# Patient Record
Sex: Female | Born: 1955
Health system: Southern US, Community
[De-identification: ages and names within clinical notes are randomized; demographics above are authoritative.]

## PROBLEM LIST (undated history)

## (undated) ENCOUNTER — Emergency Department (HOSPITAL_COMMUNITY): Disposition: A | Discharge status: Home / Self Care | Payer: 59 | Source: Home / Self Care

## (undated) DIAGNOSIS — K469 Unspecified abdominal hernia without obstruction or gangrene: Secondary | ICD-10-CM

## (undated) DIAGNOSIS — T7840XA Allergy, unspecified, initial encounter: Secondary | ICD-10-CM

## (undated) DIAGNOSIS — K648 Other hemorrhoids: Secondary | ICD-10-CM

## (undated) DIAGNOSIS — F32A Depression, unspecified: Secondary | ICD-10-CM

## (undated) DIAGNOSIS — K219 Gastro-esophageal reflux disease without esophagitis: Secondary | ICD-10-CM

## (undated) DIAGNOSIS — J449 Chronic obstructive pulmonary disease, unspecified: Secondary | ICD-10-CM

## (undated) DIAGNOSIS — E785 Hyperlipidemia, unspecified: Secondary | ICD-10-CM

## (undated) DIAGNOSIS — I10 Essential (primary) hypertension: Secondary | ICD-10-CM

## (undated) DIAGNOSIS — F329 Major depressive disorder, single episode, unspecified: Secondary | ICD-10-CM

## (undated) DIAGNOSIS — M1990 Unspecified osteoarthritis, unspecified site: Secondary | ICD-10-CM

## (undated) DIAGNOSIS — Z86010 Personal history of colonic polyps: Secondary | ICD-10-CM

## (undated) DIAGNOSIS — R0600 Dyspnea, unspecified: Secondary | ICD-10-CM

## (undated) DIAGNOSIS — Z8619 Personal history of other infectious and parasitic diseases: Secondary | ICD-10-CM

## (undated) DIAGNOSIS — E119 Type 2 diabetes mellitus without complications: Secondary | ICD-10-CM

## (undated) HISTORY — DX: Personal history of other infectious and parasitic diseases: Z86.19

## (undated) HISTORY — DX: Personal history of colonic polyps: Z86.010

## (undated) HISTORY — PX: HEMORRHOID SURGERY: SHX153

## (undated) HISTORY — DX: Chronic obstructive pulmonary disease, unspecified: J44.9

## (undated) HISTORY — PX: COLONOSCOPY: SHX174

## (undated) HISTORY — DX: Major depressive disorder, single episode, unspecified: F32.9

## (undated) HISTORY — DX: Essential (primary) hypertension: I10

## (undated) HISTORY — DX: Depression, unspecified: F32.A

## (undated) HISTORY — DX: Other hemorrhoids: K64.8

## (undated) HISTORY — DX: Unspecified osteoarthritis, unspecified site: M19.90

## (undated) HISTORY — DX: Gastro-esophageal reflux disease without esophagitis: K21.9

## (undated) HISTORY — DX: Hyperlipidemia, unspecified: E78.5

## (undated) HISTORY — PX: ABDOMINAL HYSTERECTOMY: SHX81

## (undated) HISTORY — DX: Allergy, unspecified, initial encounter: T78.40XA

---

## 1998-04-10 ENCOUNTER — Ambulatory Visit (HOSPITAL_COMMUNITY): Admission: RE | Admit: 1998-04-10 | Discharge: 1998-04-10 | Payer: Self-pay | Admitting: Family Medicine

## 1998-09-17 ENCOUNTER — Other Ambulatory Visit: Admission: RE | Admit: 1998-09-17 | Discharge: 1998-09-17 | Payer: Self-pay | Admitting: Obstetrics & Gynecology

## 1998-09-30 ENCOUNTER — Ambulatory Visit (HOSPITAL_COMMUNITY): Admission: RE | Admit: 1998-09-30 | Discharge: 1998-09-30 | Payer: Self-pay | Admitting: Obstetrics & Gynecology

## 2000-06-11 ENCOUNTER — Emergency Department (HOSPITAL_COMMUNITY): Admission: EM | Admit: 2000-06-11 | Discharge: 2000-06-11 | Payer: Self-pay | Admitting: Emergency Medicine

## 2000-08-08 ENCOUNTER — Inpatient Hospital Stay (HOSPITAL_COMMUNITY): Admission: RE | Admit: 2000-08-08 | Discharge: 2000-08-09 | Payer: Self-pay | Admitting: Family Medicine

## 2000-10-19 ENCOUNTER — Ambulatory Visit (HOSPITAL_COMMUNITY): Admission: RE | Admit: 2000-10-19 | Discharge: 2000-10-19 | Payer: Self-pay | Admitting: Family Medicine

## 2000-10-19 ENCOUNTER — Encounter: Payer: Self-pay | Admitting: Family Medicine

## 2001-06-12 ENCOUNTER — Ambulatory Visit (HOSPITAL_COMMUNITY): Admission: RE | Admit: 2001-06-12 | Discharge: 2001-06-12 | Payer: Self-pay | Admitting: Family Medicine

## 2001-08-15 ENCOUNTER — Encounter (INDEPENDENT_AMBULATORY_CARE_PROVIDER_SITE_OTHER): Payer: Self-pay

## 2001-08-15 ENCOUNTER — Inpatient Hospital Stay (HOSPITAL_COMMUNITY): Admission: RE | Admit: 2001-08-15 | Discharge: 2001-08-17 | Payer: Self-pay | Admitting: Obstetrics & Gynecology

## 2002-10-28 ENCOUNTER — Ambulatory Visit (HOSPITAL_COMMUNITY): Admission: RE | Admit: 2002-10-28 | Discharge: 2002-10-28 | Payer: Self-pay | Admitting: Surgery

## 2002-10-28 ENCOUNTER — Encounter (INDEPENDENT_AMBULATORY_CARE_PROVIDER_SITE_OTHER): Payer: Self-pay

## 2003-07-24 ENCOUNTER — Emergency Department (HOSPITAL_COMMUNITY): Admission: EM | Admit: 2003-07-24 | Discharge: 2003-07-24 | Payer: Self-pay | Admitting: Emergency Medicine

## 2004-01-26 ENCOUNTER — Ambulatory Visit (HOSPITAL_COMMUNITY): Admission: RE | Admit: 2004-01-26 | Discharge: 2004-01-26 | Payer: Self-pay | Admitting: Emergency Medicine

## 2004-01-26 ENCOUNTER — Emergency Department (HOSPITAL_COMMUNITY): Admission: EM | Admit: 2004-01-26 | Discharge: 2004-01-26 | Payer: Self-pay | Admitting: Emergency Medicine

## 2004-05-06 ENCOUNTER — Ambulatory Visit (HOSPITAL_COMMUNITY): Admission: RE | Admit: 2004-05-06 | Discharge: 2004-05-06 | Payer: Self-pay | Admitting: Family Medicine

## 2006-12-25 ENCOUNTER — Other Ambulatory Visit: Admission: RE | Admit: 2006-12-25 | Discharge: 2006-12-25 | Payer: Self-pay | Admitting: Family Medicine

## 2007-01-04 ENCOUNTER — Encounter: Admission: RE | Admit: 2007-01-04 | Discharge: 2007-01-04 | Payer: Self-pay | Admitting: Family Medicine

## 2007-01-16 ENCOUNTER — Encounter: Admission: RE | Admit: 2007-01-16 | Discharge: 2007-01-16 | Payer: Self-pay | Admitting: Family Medicine

## 2007-11-23 ENCOUNTER — Encounter: Admission: RE | Admit: 2007-11-23 | Discharge: 2007-11-23 | Payer: Self-pay | Admitting: Family Medicine

## 2008-01-06 ENCOUNTER — Emergency Department (HOSPITAL_COMMUNITY): Admission: EM | Admit: 2008-01-06 | Discharge: 2008-01-06 | Payer: Self-pay | Admitting: Emergency Medicine

## 2008-01-09 ENCOUNTER — Emergency Department (HOSPITAL_COMMUNITY): Admission: EM | Admit: 2008-01-09 | Discharge: 2008-01-09 | Payer: Self-pay | Admitting: Emergency Medicine

## 2008-01-22 ENCOUNTER — Emergency Department (HOSPITAL_COMMUNITY): Admission: EM | Admit: 2008-01-22 | Discharge: 2008-01-22 | Payer: Self-pay | Admitting: Family Medicine

## 2008-01-23 ENCOUNTER — Ambulatory Visit: Payer: Self-pay | Admitting: *Deleted

## 2008-02-11 ENCOUNTER — Ambulatory Visit: Payer: Self-pay | Admitting: Internal Medicine

## 2008-03-20 ENCOUNTER — Ambulatory Visit: Payer: Self-pay | Admitting: Internal Medicine

## 2008-03-20 ENCOUNTER — Encounter (INDEPENDENT_AMBULATORY_CARE_PROVIDER_SITE_OTHER): Payer: Self-pay | Admitting: Family Medicine

## 2008-03-20 LAB — CONVERTED CEMR LAB
ALT: 15 units/L (ref 0–35)
Basophils Absolute: 0.1 10*3/uL (ref 0.0–0.1)
CO2: 23 meq/L (ref 19–32)
Cholesterol: 244 mg/dL — ABNORMAL HIGH (ref 0–200)
Eosinophils Relative: 5 % (ref 0–5)
HCT: 42.3 % (ref 36.0–46.0)
LDL Cholesterol: 176 mg/dL — ABNORMAL HIGH (ref 0–99)
Lymphocytes Relative: 39 % (ref 12–46)
Neutro Abs: 2.4 10*3/uL (ref 1.7–7.7)
Neutrophils Relative %: 48 % (ref 43–77)
Platelets: 233 10*3/uL (ref 150–400)
Potassium: 4.3 meq/L (ref 3.5–5.3)
RDW: 16.9 % — ABNORMAL HIGH (ref 11.5–15.5)
Sodium: 140 meq/L (ref 135–145)
Total Bilirubin: 0.3 mg/dL (ref 0.3–1.2)
Total Protein: 7.3 g/dL (ref 6.0–8.3)
VLDL: 18 mg/dL (ref 0–40)
Vitamin B-12: 467 pg/mL (ref 211–911)

## 2008-03-28 ENCOUNTER — Ambulatory Visit: Payer: Self-pay | Admitting: Internal Medicine

## 2009-04-10 ENCOUNTER — Emergency Department (HOSPITAL_COMMUNITY): Admission: EM | Admit: 2009-04-10 | Discharge: 2009-04-10 | Payer: Self-pay | Admitting: Emergency Medicine

## 2009-04-30 ENCOUNTER — Emergency Department (HOSPITAL_COMMUNITY): Admission: EM | Admit: 2009-04-30 | Discharge: 2009-04-30 | Payer: Self-pay | Admitting: Emergency Medicine

## 2009-05-07 ENCOUNTER — Emergency Department (HOSPITAL_COMMUNITY): Admission: EM | Admit: 2009-05-07 | Discharge: 2009-05-07 | Payer: Self-pay | Admitting: Emergency Medicine

## 2009-05-08 ENCOUNTER — Ambulatory Visit: Payer: Self-pay | Admitting: Internal Medicine

## 2009-05-15 ENCOUNTER — Ambulatory Visit: Payer: Self-pay | Admitting: Internal Medicine

## 2009-05-22 ENCOUNTER — Ambulatory Visit (HOSPITAL_COMMUNITY): Admission: RE | Admit: 2009-05-22 | Discharge: 2009-05-22 | Payer: Self-pay | Admitting: Family Medicine

## 2009-05-29 ENCOUNTER — Ambulatory Visit (HOSPITAL_COMMUNITY): Admission: RE | Admit: 2009-05-29 | Discharge: 2009-05-29 | Payer: Self-pay | Admitting: Family Medicine

## 2009-06-24 ENCOUNTER — Ambulatory Visit: Payer: Self-pay | Admitting: Vascular Surgery

## 2009-06-24 ENCOUNTER — Encounter: Payer: Self-pay | Admitting: Family Medicine

## 2009-06-24 ENCOUNTER — Ambulatory Visit: Payer: Self-pay | Admitting: Internal Medicine

## 2009-06-24 ENCOUNTER — Encounter: Payer: Self-pay | Admitting: Internal Medicine

## 2009-06-24 ENCOUNTER — Ambulatory Visit: Admission: RE | Admit: 2009-06-24 | Discharge: 2009-06-24 | Payer: Self-pay | Admitting: Internal Medicine

## 2009-06-24 LAB — CONVERTED CEMR LAB
ALT: 15 units/L (ref 0–35)
Alkaline Phosphatase: 103 units/L (ref 39–117)
Basophils Absolute: 0.1 10*3/uL (ref 0.0–0.1)
Basophils Relative: 1 % (ref 0–1)
LDL Cholesterol: 182 mg/dL — ABNORMAL HIGH (ref 0–99)
MCHC: 32.6 g/dL (ref 30.0–36.0)
Neutro Abs: 2.3 10*3/uL (ref 1.7–7.7)
Neutrophils Relative %: 48 % (ref 43–77)
RBC: 4.43 M/uL (ref 3.87–5.11)
RDW: 18.3 % — ABNORMAL HIGH (ref 11.5–15.5)
Sodium: 143 meq/L (ref 135–145)
Total Bilirubin: 0.3 mg/dL (ref 0.3–1.2)
Total Protein: 6.7 g/dL (ref 6.0–8.3)
Triglycerides: 112 mg/dL (ref <150)
VLDL: 22 mg/dL (ref 0–40)

## 2009-06-25 ENCOUNTER — Encounter (INDEPENDENT_AMBULATORY_CARE_PROVIDER_SITE_OTHER): Payer: Self-pay | Admitting: Internal Medicine

## 2009-07-15 ENCOUNTER — Encounter: Payer: Self-pay | Admitting: Family Medicine

## 2009-07-15 ENCOUNTER — Ambulatory Visit: Payer: Self-pay | Admitting: Internal Medicine

## 2009-07-15 LAB — CONVERTED CEMR LAB
Chlamydia, DNA Probe: NEGATIVE
GC Probe Amp, Genital: NEGATIVE

## 2009-08-26 ENCOUNTER — Ambulatory Visit: Payer: Self-pay | Admitting: Internal Medicine

## 2009-08-26 ENCOUNTER — Encounter: Payer: Self-pay | Admitting: Family Medicine

## 2009-08-26 LAB — CONVERTED CEMR LAB
ALT: 26 units/L (ref 0–35)
CO2: 20 meq/L (ref 19–32)
Cholesterol: 229 mg/dL — ABNORMAL HIGH (ref 0–200)
Creatinine, Ser: 1.11 mg/dL (ref 0.40–1.20)
HDL: 45 mg/dL (ref >39)
Total Bilirubin: 0.3 mg/dL (ref 0.3–1.2)
Total CHOL/HDL Ratio: 5.1
VLDL: 27 mg/dL (ref 0–40)

## 2009-09-16 ENCOUNTER — Ambulatory Visit: Payer: Self-pay | Admitting: Internal Medicine

## 2009-12-07 ENCOUNTER — Ambulatory Visit: Payer: Self-pay | Admitting: Family Medicine

## 2010-10-05 ENCOUNTER — Emergency Department (HOSPITAL_COMMUNITY)
Admission: EM | Admit: 2010-10-05 | Discharge: 2010-10-05 | Payer: Self-pay | Source: Home / Self Care | Admitting: Family Medicine

## 2011-02-06 LAB — COMPREHENSIVE METABOLIC PANEL
ALT: 25 U/L (ref 0–35)
AST: 19 U/L (ref 0–37)
Albumin: 3.5 g/dL (ref 3.5–5.2)
Alkaline Phosphatase: 116 U/L (ref 39–117)
BUN: 10 mg/dL (ref 6–23)
CO2: 27 mEq/L (ref 19–32)
Calcium: 9 mg/dL (ref 8.4–10.5)
Chloride: 106 mEq/L (ref 96–112)
Creatinine, Ser: 1.09 mg/dL (ref 0.4–1.2)
GFR calc Af Amer: >60 mL/min (ref >60)
GFR calc non Af Amer: 53 mL/min — ABNORMAL LOW (ref >60)
Glucose, Bld: 96 mg/dL (ref 70–99)
Potassium: 3.5 mEq/L (ref 3.5–5.1)
Sodium: 139 mEq/L (ref 135–145)
Total Bilirubin: 0.5 mg/dL (ref 0.3–1.2)
Total Protein: 7.3 g/dL (ref 6.0–8.3)

## 2011-02-06 LAB — POCT CARDIAC MARKERS
CKMB, poc: 1.6 ng/mL (ref 1.0–8.0)
Myoglobin, poc: 100 ng/mL (ref 12–200)
Troponin i, poc: <0.05 ng/mL (ref 0.00–0.09)

## 2011-02-06 LAB — DIFFERENTIAL
Basophils Absolute: 0.1 10*3/uL (ref 0.0–0.1)
Basophils Relative: 1 % (ref 0–1)
Eosinophils Absolute: 0.2 10*3/uL (ref 0.0–0.7)
Eosinophils Relative: 3 % (ref 0–5)
Lymphocytes Relative: 35 % (ref 12–46)
Lymphs Abs: 2.1 10*3/uL (ref 0.7–4.0)
Monocytes Absolute: 0.3 10*3/uL (ref 0.1–1.0)
Monocytes Relative: 6 % (ref 3–12)
Neutro Abs: 3.3 10*3/uL (ref 1.7–7.7)
Neutrophils Relative %: 56 % (ref 43–77)

## 2011-02-06 LAB — URINALYSIS, ROUTINE W REFLEX MICROSCOPIC
Bilirubin Urine: NEGATIVE
Glucose, UA: NEGATIVE mg/dL
Hgb urine dipstick: NEGATIVE
Ketones, ur: NEGATIVE mg/dL
Nitrite: NEGATIVE
Protein, ur: NEGATIVE mg/dL
Specific Gravity, Urine: 1.017 (ref 1.005–1.030)
Urobilinogen, UA: 0.2 mg/dL (ref 0.0–1.0)
pH: 6.5 (ref 5.0–8.0)

## 2011-02-06 LAB — CBC
MCHC: 34.3 g/dL (ref 30.0–36.0)
Platelets: 223 10*3/uL (ref 150–400)
RBC: 4.49 MIL/uL (ref 3.87–5.11)
RDW: 16.4 % — ABNORMAL HIGH (ref 11.5–15.5)

## 2011-02-06 LAB — URINE MICROSCOPIC-ADD ON

## 2011-02-06 LAB — PROTIME-INR
INR: 1 (ref 0.00–1.49)
Prothrombin Time: 13.1 seconds (ref 11.6–15.2)

## 2011-02-06 LAB — D-DIMER, QUANTITATIVE: D-Dimer, Quant: 0.57 ug/mL-FEU — ABNORMAL HIGH (ref 0.00–0.48)

## 2011-02-06 LAB — APTT: aPTT: 31 seconds (ref 24–37)

## 2011-02-07 LAB — CBC
HCT: 44.1 % (ref 36.0–46.0)
Hemoglobin: 14.8 g/dL (ref 12.0–15.0)
MCHC: 33.6 g/dL (ref 30.0–36.0)
MCV: 90.9 fL (ref 78.0–100.0)
RBC: 4.85 MIL/uL (ref 3.87–5.11)
WBC: 5.4 10*3/uL (ref 4.0–10.5)

## 2011-02-07 LAB — COMPREHENSIVE METABOLIC PANEL
Albumin: 3.4 g/dL — ABNORMAL LOW (ref 3.5–5.2)
Alkaline Phosphatase: 122 U/L — ABNORMAL HIGH (ref 39–117)
BUN: 11 mg/dL (ref 6–23)
Calcium: 9.3 mg/dL (ref 8.4–10.5)
Glucose, Bld: 92 mg/dL (ref 70–99)
Potassium: 4.5 mEq/L (ref 3.5–5.1)
Total Protein: 7.2 g/dL (ref 6.0–8.3)

## 2011-02-07 LAB — URINALYSIS, ROUTINE W REFLEX MICROSCOPIC
Bilirubin Urine: NEGATIVE
Glucose, UA: NEGATIVE mg/dL
Hgb urine dipstick: NEGATIVE
Ketones, ur: 15 mg/dL — AB
Nitrite: NEGATIVE
Specific Gravity, Urine: 1.023 (ref 1.005–1.030)
pH: 5.5 (ref 5.0–8.0)

## 2011-02-07 LAB — URINE MICROSCOPIC-ADD ON

## 2011-03-18 NOTE — Op Note (Signed)
   NAME:  Krystal Allen, Krystal Allen                         ACCOUNT NO.:  [id number]   MEDICAL RECORD NO.:  [id number]                   PATIENT TYPE:  AMB   LOCATION:  DAY                                  FACILITY:  [facility]   PHYSICIAN:  Coralie Keens, M.D.              DATE OF BIRTH:  [birth year]   DATE OF PROCEDURE:  10/28/2002  DATE OF DISCHARGE:                                 OPERATIVE REPORT   PREOPERATIVE DIAGNOSIS:  Bleeding hemorrhoids.   POSTOPERATIVE DIAGNOSIS:  Bleeding hemorrhoids.   OPERATION PERFORMED:  Hemorrhoidectomy.   SURGEON:  Douglas A. Ninfa Linden, M.D.   ASSISTANT:  Merri Ray. Grandville Silos, MD   ANESTHESIA:  General endotracheal and 0.25% Marcaine with epinephrine.   ESTIMATED BLOOD LOSS:  Minimal.   DESCRIPTION OF PROCEDURE:  The patient was brought to the operating room and  identified.  She was placed in supine position and electively intubated.  She was then placed in the prone position.  Her perianal area was then  prepped and draped in the usual sterile fashion.  The patient had several  large inflamed and edematous external and internal hemorrhoids.  Two areas  at the posterior and left anterior position were found to be mildly  excoriated and larger than the rest.  These areas were each grasped with  [facility] clamps.  2-0 Chromic sutures were then tied up proximally to the area  of excision.  Each hemorrhoid was then excised ________ with the  electrocautery.  The defects were then each closed with running interlocked  2-0 chromic suture.  Both areas appeared to be controlled well with the  sutures as far as hemostasis goes.  The rest of the anal canal was examined  and no further pathology was identified.  Each specimen of hemorrhoidal  tissue was sent to pathology for identification.  Next, the perianal area  was circumferentially anesthetized with 0.25% Marcaine with epinephrine.  A  piece of Gelfoam was then inserted into the anal canal.  Gauze was then  placed  over this.  The patient tolerated the procedure well.  All sponge,  needle and instrument counts were correct at the end of the procedure.  The  patient was then placed back in the supine position, extubated in the  operating room and taken in stable condition to the recovery room.                                               Coralie Keens, M.D.    DB/MEDQ  D:  10/28/2002  T:  10/28/2002  Job:  [id number]

## 2011-03-18 NOTE — Discharge Summary (Signed)
[facility]  Patient:    Krystal Allen, Krystal Allen Visit Number: [id number] MRN: [id number]          Service Type: GYN Location: [place] 01 Attending Physician:  Huntley Dec Dictated by:   Cristopher Estimable. Stann Mainland, M.D. Admit Date:  08/15/2001 Discharge Date: 08/17/2001   CC:         Dane M. Stann Mainland, M.D.   Discharge Summary  DISCHARGE DIAGNOSES:          1. Adenomyosis.                               2. Endometriosis.                               3. Menorrhagia.                               4. Anemia secondary to menorrhagia.                               5. Endometrial polyp.  PROCEDURES:                   1. Total abdominal hysterectomy.                               2. Bilateral salpingo-oophorectomy.  SUMMARY:                      This 55 year old patient was sent to me by [facility] after an ultrasound and evaluation. It was felt that she possibly had a submucosal myoma responsible for her menorrhagia and anemia. She was taken to the operating room on October 16 after a normal preoperative workup for a total abdominal hysterectomy and bilateral salpingo-oophorectomy. She was not a candidate for hormonal medication as she has had a deep venous thrombosis of her lower extremities in 2001.  Procedure was carried out without incident. Pathology revealed extensive adenomyosis, and endometrial polyp, and endometriosis of the tubes. Postoperatively, she did very well. Her discharge hemoglobin was 8.8 (preoperative hemoglobin 9.6). She remained afebrile during her entire hospital course.  On the morning of October 18, she was ambulating well, tolerating a regular diet well, having normal bowel and bladder function and was afebrile. Her wound was clean and dry and her bowel function was normal for the second postoperative day. She was urinating without difficulty and she was ready for discharge. Accordingly, she was  given all appropriate instructions and understood all instructions well.  DISCHARGE MEDICATIONS:        1. Motrin 600 mg every six hours.                               2. Tylox one to two every four to six hours.                               3. Ferrous sulfate 300 mg daily.  4. Over-the-counter medications as needed for                                  constipation and/or gas.  DISCHARGE FOLLOWUP:           She will return to the office in approximately four weeks time or as needed. At the time of discharge, her wound was clean and dry and her staples were removed and her wound was Steri-Stripped with benzoin.  CONDITION ON DISCHARGE:       Improved. Dictated by:   Cristopher Estimable. Stann Mainland, M.D. Attending Physician:  Huntley Dec DD:  08/17/01 TD:  08/19/01 Job: [id number]

## 2011-03-18 NOTE — Discharge Summary (Signed)
[facility]  Patient:    Krystal Allen, Krystal Allen                      MRN: [id number] Adm. Date:  [id number] Disc. Date: [id number] Attending:  Alcide Clever Dictator:   Alcide Clever CC:         Biagio Borg, M.D.   Discharge Summary  DISCHARGE DIAGNOSES: 1. Left lower extremity deep venous thrombosis. 2. Tobacco use 1/2 pack per day x 10 years. 3. Anemia with a hematocrit of 31.3, MCV of 78. 4. Obesity. 5. History of ectopic pregnancy. 6. History of hemorrhoids with occasional bleeding.  DISCHARGE MEDICATIONS: 1. Coumadin 5 mg p.o. q.h.s. 2. Nicotine patch 7 mg p.o. q.d. x 7 days. 3. Enoxaparin 90 mg subcutaneous q.12.h. 3 day supply.  FOLLOW-UP:  Dr. Valere Dross, office visit, Wednesday, August 16, 2000, at 9 a.m. Lab work to be checked on Friday, August 11, 2000, at 11:30 a.m. at Dr. Margo Common office, checking the INR and hematocrit.  CONDITION ON DISCHARGE:  Good.  REASON FOR ADMISSION:  The patient is a 55 year old African-American female in her usual state of good health.  She was admitted via the vascular lab for a lower extremity DVT.  She had an approximately one week history of left lower extremity pain and subsequently swelling which was not relieved with NSAIDS. Ultimately, the vascular study revealed the DVT from her calf extending to the mid thigh.  She denied any chest pain, shortness of breath, pleuritic pain, or constitutional symptoms.  She did endorse occasional rectal bleeding associated with hemorrhoids.  No family history of thrombophilia, miscarriages.  No personal history of DVTs.  There was no history of sedentary behavior prior to this, and she takes no estrogens.  HOSPITAL COURSE: #1 - LEFT LOWER EXTREMITY DEEP VENOUS THROMBOSIS:  First episode, there were no clear precipitants or risks.  It was felt given that this was the first episode that a hypercoagulable work-up was not in order.  The patient was initiated on a  heparin drip and started on Coumadin therapy on the night of admission with a target INR of 2-3.  Would expect course of treatment to be at least six months.  The patients INR on discharge was 1.3.  She will be discharged on both Coumadin 5 mg p.o. q.h.s. and enoxaparin as a bridging therapy.  #2 - TOBACCO USE:  The patient is motivated to stop smoking.  She reported a marked improvement in her cravings with a nicotine patch; therefore, she will be discharged on a  nicotine patch with strong encouragement to continue her current efforts.  #3 - HISTORY OF HEMORRHOIDS AND HEMATOCHEZIA:  No evidence of guaiac positive stools.  Her admission hematocrit was low at 31.3 with an MCV of 78.  Would recommend follow-up of this and work-up of her anemia which may either be most likely gynecologic or GI source.  The patient will be discharged to home today. DD:  08/09/00 TD:  08/10/00 Job: [id number]

## 2011-03-18 NOTE — Op Note (Signed)
[facility]  Patient:    Krystal Allen, Krystal Allen Visit Number: [id number] MRN: [id number]          Service Type: GYN Location: [place] 01 Attending Physician:  Huntley Dec Dictated by:   Cristopher Estimable. Stann Mainland, M.D. Proc. Date: 08/15/01 Admit Date:  08/15/2001                             Operative Report  PATIENTS AGE:  55.  PREOPERATIVE DIAGNOSES:       1. Submucosal myoma.                               2. Menorrhagia.                               3. Anemia secondary to menorrhagia.                               4. History of deep venous thrombosis 2001,                                  lower extremities.  POSTOPERATIVE DIAGNOSES:      1. Submucosal myoma.                               2. Menorrhagia.                               3. Anemia secondary to menorrhagia.                               4. History of deep venous thrombosis 2001,                                  lower extremities.                               5. Pathology pending.  PROCEDURE:                    1. Total abdominal hysterectomy.                               2. Bilateral salpingo-oophorectomy.  SURGEON:                      Cristopher Estimable. Stann Mainland, M.D.  ASSISTANT:                    Janice Coffin. Deatra Ina, M.D.  ANESTHESIA:                   General endotracheal.  ESTIMATED BLOOD LOSS:         300 cc.  DESCRIPTION OF PROCEDURE:     The patient was taken to the operating room where after satisfactory induction of general endotracheal anesthesia, she was prepped and draped and placed  in the usual supine position. A Foley catheter was inserted as part of the prep.  After the patient was adequately draped, the procedure was begun.  A Pfannenstiel incision was made and dissected down sharply to and through the fascia in a low transverse fashion with hemostasis created at each layer. Subfascial spaces created inferiorly and superiorly, muscles separated in the midline, peritoneum  identified and entered appropriately with care taken for the bowel superiorly and the bladder inferiorly. At this time, the abdomen was inspected. Liver edge and surface of diaphragm felt normal as did both kidneys. The uterus itself was very globular and probably about 8-week gestational size to 10-week gestational size. There was a functional appearing cyst on the left ovary. The right ovary appeared normal. There was a previous tubal ligation site noted. There was no evidence of any adhesions or endometriosis.  At this time, the self-retaining retractor was placed and bowel was packed off without difficulty. Hysterectomy at this time was begun in the usual fashion.  Kelly clamps were applied to the cornua of the uterus and the uterus was elevated. The round ligaments at this time were suture ligated and divided. The bladder flap was created anteriorly with minimal difficulty. The infundibulopelvic pedicles bilaterally were then clamped, cut, and doubly suture secured with O Vicryl suture after identification of each ureter. At this time, with good visualization anteriorly and posteriorly, the uterine artery pedicles were clamped, cut, and suture secured bilaterally. An additional parametrial bite was taken in a similar fashion bilaterally and suture secured with O Vicryl suture. At this time, because of the breadth of the uterus, decision was made to excise the fundus which was carried out without difficulty and that specimen was passed off. The remnants of the lower segment and cervix were then grasped and elevated. A remainder of the anterior peritoneum was taken off the bladder and at this time, using straight Heaney clamps and suture ligature of O Vicryl, the lower segment and cervix was dissected out down to the vaginal angle. The uterosacral pedicles were clamped, cut, and suture secured and at this time, a stab wound was made anteriorly and using the Satinsky scissors and Mayo  scissors, the cervix was incised in its entirety. At this time, the vaginal angles were secured with O Vicryl in a figure-of-eight fashion. Again, the ureters were identified and noted to be intact bilaterally. Closure of the vaginal cuff was then carried out with O Vicryl in a figure-of-eight interrupted fashion. Profuse irrigation was then carried out and the pelvis rendered completely hemostatic. Procedure at this time was felt to be complete. Again, the ureters were identified and noted to be intact. The patient had output of clear urine. At this time, all packs were removed and the self-retaining retractor was removed. The appendix was visualized and palpated and noted to be normal in its course and was left in vivo. At this time, assured that all counts were correct and with no foreign bodies noted to be remaining in the abdominal cavity, the abdomen was closed in layers. The abdominal peritoneum was closed with O Vicryl in a running fashion and muscles secured with same. Assured of good subfascial hemostasis, the fascia was then reapproximated using O Vicryl from angle to midline bilaterally. Subcutaneous tissue was rendered hemostatic and staples were then used to close the skin. A sterile pressure dressing was applied and the patient at this time was awakened and transported to recovery room in satisfactory condition having tolerated the procedure well.  Estimated blood loss was 300 cc. All counts were correct x 2. Specimen included the uterus, tubes, and ovaries.  Upon arrival in the recovery room, the patient had stable vital signs and good urine output, which was clear. Dictated by:   Cristopher Estimable. Stann Mainland, M.D. Attending Physician:  Huntley Dec DD:  08/15/01 TD:  08/15/01 Job: [id number]

## 2011-03-18 NOTE — H&P (Signed)
[facility]  Patient:    Krystal Allen, Krystal Allen Visit Number: [id number] MRN: [id number]          Service Type: Attending:  Cristopher Estimable. Stann Mainland, M.D. Dictated by:   Cristopher Estimable. Stann Mainland, M.D. Adm. Date:  08/15/01   CC:         [facility], [address] [place], [place]  [address] (unit [phone])                         History and Physical  OFFICE NUMBER:                [id number]  HISTORY OF PRESENT ILLNESS:   Krystal Allen is a 55 year old gravida 23, para 3, aborta 1, black female whom I had not seen since November 1999 at which time she had a uterus that was upper limits of normal size and a history of some mild menorrhagia.  I saw her again on referral from [place] at [facility] where she has been seen several times during the month of September with severe bleeding and associated anemia.  An ultrasound was obtained at [facility] on September 23 and revealed a very likely myoma - submucosal uterus that was 10 x 7 x 6.2 x 8.3 cm in size.  The left ovary appeared normal with the exception of a thin-walled simple cyst measuring 4 x 3 x 2 cm and the right ovary was not visualized.  She was seen by me on October 8.  She had previously had had a Pap smear several months earlier which was reportedly normal.  When I saw her on October 8, the uterus sounded to 11 cm and an endometrial biopsy revealed disordered proliferative endometrium, no hyperplasia or malignancy identified.  Her hemoglobin in the office was 10.4 which was an improvement from her earlier hemoglobin at [place] which was below 9.  Her FSH was 3.5 and the thyroid stimulating hormone test was within normal limits.  A mammogram was done as well as a pelvic examination.  The uterus did not pull down well.  It was felt that she had a significant submucosal myoma with bleeding and subsequent anemia.  The patient was not a candidate or will not be a  candidate for hormone replacement or hormone manipulation as she has had in the past a deep venous thrombosis of her lower extremities treated with anticoagulation.  She currently is not on any medicine except for ferrous sulfate.  Discussion with the patient was carried out in great detail and decision was made to proceed with a total abdominal hysterectomy and bilateral salpingo-oophorectomy to resolve her problem.                                She has no known allergies.  She has always had normal cervical cytology.  She has not had any previous surgical procedures except for a postpartum tubal sterilization procedure after the birth of her last child.  She did undergo a diagnostic dilatation and curettage in December 1999 done by myself and at that time had benign secretory endometrium, no hyperplasia or malignancy identified.  At the time of the D&C, the cavity felt symmetrical and smooth.  PAST MEDICAL HISTORY/SOCIAL HISTORY:                      Noncontributory.  FAMILY HISTORY:  Essentially negative.  REVIEW OF SYSTEMS:            Noncontributory.  PHYSICAL EXAMINATION:  GENERAL:                      Examination at the time of admission finds a well-developed female in no distress.  VITAL SIGNS:                  Weight 214, height 5 feet, 8 inches, blood pressure 130/70, temperature 98.2, pulse 80 and regular, respirations 18 and regular.  HEENT:                        Negative.  NECK:                         Thyroid not enlarged.  CHEST:                        Clear.  CARDIOVASCULAR:               Regular rhythm, no murmur.  BREASTS:                      Negative.  ABDOMEN:                      Examination unremarkable.  PELVIC:                       Examination finds a uterus that is irregular sized, approximately overall 8 to 10 weeks size.  Cervix - no gross lesions. Uterus does not pull down well.  Adnexal area is essentially  negative.  RECTOVAGINAL:                 Confirmatory.  EXTREMITIES:                  Negative.  NEUROLOGIC:                   Exam physiologic.  IMPRESSION:                   1. Likely submucosal myoma.                               2. Menorrhagia secondary to #1.                               3. Anemia secondary to #2.                               4. History of deep venous thrombosis of lower                                  extremities.  PLAN:                         Total abdominal hysterectomy and bilateral salpingo-oophorectomy with no hormone replacement therapy - this has been discussed in detail with the patient and is well understood by the patient. She accepts the risks and potential benefits of the surgical procedure and understands that,  as we are removing her ovaries (this is her choice) she will likely have some menopausal symptoms because of induced surgical menopause. She is aware of this and understands that we will not be able to replace her with hormone replacement therapy secondary to her deep venous thrombosis history. Dictated by:   Cristopher Estimable. Stann Mainland, M.D. Attending:  Cristopher Estimable. Stann Mainland, M.D. DD:  08/14/01 TD:  08/14/01 Job: [id number]

## 2011-04-10 ENCOUNTER — Observation Stay (HOSPITAL_COMMUNITY)
Admission: EM | Admit: 2011-04-10 | Discharge: 2011-04-11 | Disposition: A | Discharge status: Home / Self Care | Payer: Self-pay | Attending: Internal Medicine | Admitting: Internal Medicine

## 2011-04-10 ENCOUNTER — Emergency Department (HOSPITAL_COMMUNITY): Payer: Self-pay

## 2011-04-10 DIAGNOSIS — E785 Hyperlipidemia, unspecified: Secondary | ICD-10-CM | POA: Insufficient documentation

## 2011-04-10 DIAGNOSIS — E669 Obesity, unspecified: Secondary | ICD-10-CM | POA: Insufficient documentation

## 2011-04-10 DIAGNOSIS — Z86718 Personal history of other venous thrombosis and embolism: Secondary | ICD-10-CM | POA: Insufficient documentation

## 2011-04-10 DIAGNOSIS — F17200 Nicotine dependence, unspecified, uncomplicated: Secondary | ICD-10-CM | POA: Insufficient documentation

## 2011-04-10 DIAGNOSIS — J42 Unspecified chronic bronchitis: Secondary | ICD-10-CM | POA: Insufficient documentation

## 2011-04-10 DIAGNOSIS — R072 Precordial pain: Principal | ICD-10-CM | POA: Insufficient documentation

## 2011-04-10 DIAGNOSIS — R0602 Shortness of breath: Secondary | ICD-10-CM | POA: Insufficient documentation

## 2011-04-10 LAB — CARDIAC PANEL(CRET KIN+CKTOT+MB+TROPI): Total CK: 132 U/L (ref 7–177)

## 2011-04-10 LAB — COMPREHENSIVE METABOLIC PANEL
AST: 13 U/L (ref 0–37)
Alkaline Phosphatase: 109 U/L (ref 39–117)
CO2: 28 mEq/L (ref 19–32)
Chloride: 108 mEq/L (ref 96–112)
Creatinine, Ser: 1.09 mg/dL (ref 0.4–1.2)
GFR calc non Af Amer: 52 mL/min — ABNORMAL LOW (ref >60)
Total Bilirubin: 0.2 mg/dL — ABNORMAL LOW (ref 0.3–1.2)

## 2011-04-10 LAB — CBC
HCT: 39.6 % (ref 36.0–46.0)
MCV: 88 fL (ref 78.0–100.0)
Platelets: 187 10*3/uL (ref 150–400)
RBC: 4.5 MIL/uL (ref 3.87–5.11)
WBC: 6.5 10*3/uL (ref 4.0–10.5)

## 2011-04-10 LAB — CK TOTAL AND CKMB (NOT AT ARMC)
CK, MB: 2.1 ng/mL (ref 0.3–4.0)
CK, MB: 2.3 ng/mL (ref 0.3–4.0)
Total CK: 118 U/L (ref 7–177)

## 2011-04-10 MED ORDER — IOHEXOL 300 MG/ML  SOLN: 100.0000 mL | Freq: Once | INTRAMUSCULAR | Status: AC | PRN

## 2011-04-10 NOTE — H&P (Signed)
NAMEMarland Kitchen  Krystal Allen, Krystal Allen NO.:  [id number]  MEDICAL RECORD NO.:  [id number]  LOCATION:  [facility]                         FACILITY:  [place]  PHYSICIAN:  Azzie Roup, MD      DATE OF BIRTH:  07/31/1956  DATE OF ADMISSION:  04/10/2011 DATE OF DISCHARGE:                             HISTORY & PHYSICAL   CHIEF COMPLAINT:  Chest pain.  HISTORY OF PRESENT ILLNESS:  This is a 55 year old female with a history of tobacco abuse and a prior DVT, who presents to the [facility] Emergency Room today due to chest tightness, numbness on her left arm, dizziness, shortness of breath, and diaphoresis that occurred today while she was walking at work.  She states that the symptoms lasted about 4 hours.  She received nitro by the paramedics, who brought her to the hospital, and the nitroglycerin did not seem to affect her pain.  She has never had any discomfort like this in the past.  She has never had a stress test before nor cardiac catheterization.  She denies any relationship of any chest pain to food and she denies any activities that could cause a recent musculoskeletal or chest wall strain.  ALLERGIES: 1. PENICILLIN. 2. SULFA.  MEDICATIONS:  She occasionally uses BC powders at home for pain.  PAST MEDICAL HISTORY:  Significant for; 1. Prior DVT 4-5 years ago for which she was on Coumadin therapy for a     period of time, but then subsequently discontinued. 2. Questionable history of COPD. 3. She states that Advair have been prescribed for her at some point     in the past, but she decided not to take it because she does not     believe that she actually has any lung problems. 4. Tobacco abuse at episode of Bell palsy.  SOCIAL HISTORY:  She lives in [place] with her son.  She smokes half pack per day and has done so for the last 15 years.  She works at a rehab center.  She uses alcohol rarely.  She denies any history of illicit drug use.  FAMILY HISTORY:  There is no  premature coronary disease in the family and a sudden cardiac death.  REVIEW OF SYSTEMS:  Review of 10 systems was performed is negative with exception of complaints previously detailed in the HPI.  PHYSICAL EXAMINATION:  VITAL SIGNS:  Temperature 98.2, blood pressure 149/77, pulse 62, respirations 18, and 98% on room air. GENERAL:  This is a well-appearing middle-aged [place] American female, in no acute distress.  Awake, alert, and oriented x3. HEENT:  Normocephalic and atraumatic.  Extraocular movements intact. Moist mucous membranes. NECK:  No jugular venous distention. CARDIOVASCULAR:  Regular rate and rhythm.  S1 and S2.  No murmurs, rubs, or gallops. LUNGS:  Clear to auscultation bilaterally.  No crackles, wheezes, or rhonchi. ABDOMEN:  Soft, nontender, and nondistended.  Normoactive bowel sounds. EXTREMITIES:  No pretibial edema. NEUROLOGIC:  Awake, alert, and oriented x3.  No focal motor or sensory deficits. MUSCULOSKELETAL:  The patient does have a focal area on her left upper chest wall where there is tenderness to palpation. PSYCHIATRIC:  Appropriate mood and affect.  LABORATORY  DATA:  Troponins are negative x2.  A chest x-ray did not show any acute findings.  CTA of the chest revealed evidence of pulmonary embolism.  An EKG was conducted, which reveals normal sinus rhythm with T-wave inversions in V1 and V2 and in AVL.  ASSESSMENT AND PLAN:  Ms. Bicknell is a 55 year old female with cardiac risk factors including tobacco abuse and recorded elevated blood pressures while she has been here in the hospital, who presents with chest discomfort. 1. Chest discomfort.  The patient does have one area of her chest     wall where her pain is reproduced with palpation.  Therefore,     she may have some component of musculoskeletal strain causing her     symptoms.  However, it is concerning that she did get short of     breath, diaphoretic, and have left arm numbness with numbness  with     exertion.  Therefore, I believe it is important that the patient     receive an exercise nuclear stress test in the morning and an     echocardiogram also be checked to evaluate her heart function.     These tests have been ordered.  We will continue to cycle her     cardiac enzymes.  She will be placed on telemetry overnight.  She     will be given aspirin 81 mg daily.  Lipid panel will be checked in     the morning.  She will receive p.r.n. sublingual nitroglycerin     should she have further chest pain.  We will also further risk     stratify her checking TSH and hemoglobin A1c.  Base metabolic panel     and CBC will be checked as well. 2. Prophylaxis.  The patient will be placed on subcutaneous Lovenox. 3. Code status.  The patient wishes to be a full code.     Azzie Roup, MD     HR/MEDQ  D:  04/10/2011  T:  04/10/2011  Job:  [id number]  Electronically Signed by Azzie Roup MD on 04/10/2011 07:53:49 PM

## 2011-04-11 ENCOUNTER — Inpatient Hospital Stay (HOSPITAL_COMMUNITY): Payer: Self-pay

## 2011-04-11 LAB — CARDIAC PANEL(CRET KIN+CKTOT+MB+TROPI)
CK, MB: 2.2 ng/mL (ref 0.3–4.0)
Relative Index: 1.9 (ref 0.0–2.5)
Total CK: 116 U/L (ref 7–177)
Troponin I: <0.3 ng/mL (ref <0.30)

## 2011-04-11 LAB — TSH: TSH: 1.288 u[IU]/mL (ref 0.350–4.500)

## 2011-04-11 LAB — CBC
MCV: 88.8 fL (ref 78.0–100.0)
Platelets: 201 10*3/uL (ref 150–400)
RBC: 4.46 MIL/uL (ref 3.87–5.11)
WBC: 5.1 10*3/uL (ref 4.0–10.5)

## 2011-04-11 LAB — BASIC METABOLIC PANEL
BUN: 11 mg/dL (ref 6–23)
Calcium: 8.6 mg/dL (ref 8.4–10.5)
Creatinine, Ser: 1.11 mg/dL (ref 0.4–1.2)
GFR calc Af Amer: >60 mL/min (ref >60)
GFR calc non Af Amer: 51 mL/min — ABNORMAL LOW (ref >60)

## 2011-04-11 LAB — HEMOGLOBIN A1C
Hgb A1c MFr Bld: 5.8 % — ABNORMAL HIGH (ref <5.7)
Mean Plasma Glucose: 120 mg/dL — ABNORMAL HIGH (ref <117)

## 2011-04-11 LAB — LIPID PANEL: Cholesterol: 211 mg/dL — ABNORMAL HIGH (ref 0–200)

## 2011-04-11 MED ORDER — TECHNETIUM TC 99M TETROFOSMIN IV KIT
10.0000 | PACK | Freq: Once | INTRAVENOUS | Status: AC | PRN
  Administered 2011-04-11: 10 via INTRAVENOUS

## 2011-04-11 MED ORDER — TECHNETIUM TC 99M TETROFOSMIN IV KIT
30.0000 | PACK | Freq: Once | INTRAVENOUS | Status: AC | PRN
  Administered 2011-04-11: 30 via INTRAVENOUS

## 2011-04-12 ENCOUNTER — Other Ambulatory Visit (HOSPITAL_COMMUNITY): Payer: Self-pay

## 2011-04-12 NOTE — Discharge Summary (Signed)
Krystal Allen, Krystal Allen               ACCOUNT NO.:  [id number]  MEDICAL RECORD NO.:  [id number]  LOCATION:  2031                         FACILITY:  [facility]  PHYSICIAN:  Annita Brod, M.D.DATE OF BIRTH:  11/26/1955  DATE OF ADMISSION:  04/10/2011 DATE OF DISCHARGE:  04/11/2011                              DISCHARGE SUMMARY   PRIMARY CARE PHYSICIAN:  She did not have one.  We are referring her to the [facility].  CONSULTANTS ON THIS CASE:  Belva Crome, MD  DISCHARGE DIAGNOSES: 1. Precordial chest pain, felt to be noncardiac, more likely     bronchospasm. 2. Tobacco abuse. 3. Early signs of chronic obstructive pulmonary disease with chronic     bronchitis based on chest x-ray findings 4. Hyperlipidemia, new diagnosis. 5. Obesity.  DISCHARGE MEDICATIONS:  New medicine: 1. Albuterol MDI 2 puffs 4 times a day as needed. 2. Nicorette gum use as directed. 3. Pravachol 10 mg p.o. nightly. 4. The patient will continue on BC powders p.r.n. for headache.  HOSPITAL COURSE:  The patient is a 55 year old African female with past medical history of tobacco abuse and in the past, they have been told that she has reportedly has had COPD, but she feels like she does not have this.  She came in complaining of chest tightness, numbness in her left arm and shortness of breath.  She came to the emergency room, she evaluated.  She had enzymes x3, which were negative.  EKG which was unremarkable.  D-dimer is negative to rule out PE.  She was evaluated given her advanced age for dyspnea on exertion.  Her other related symptoms was felt best to consult Cardiology, Dr. Daneen Schick from [facility] was on-call, evaluated the patient, took the patient for an exercise nuclear stress test as well as echocardiogram.  Echocardiogram is pending as of this dictation, however, the patient's exercise stress test came back noting normal myocardial perfusion study and normal wall motion with  an EF of 67%.  With these findings, I discussed with the patient.  She felt to be doing much better.  We discussed with a negative cardiac stress test.  It was more likely possibly from bronchospasm given her smoking.  Her chest x-ray findings of no evidence of any acute processes, but chronic bronchitic changes.  She was amenable to quitting.  She says she will get a try.  She is willing to accept the prescription for Nicorette gum as well as albuterol inhaler. I also checked her lipid profile as part of the routine for chest pain rule out and she is found to have a total cholesterol of 211 with an LDL of 144.  We are making referral for [facility] and start the patient on Pravachol 10 mg p.o. nightly, which is a generic medication and she should be able to afford.  Her baseline LFTs were normal when she first came.  DISPOSITION:  Improved.  ACTIVITY:  Slowly increase.  DISCHARGE DIET:  Heart-healthy diet.  She is being discharged to home.  She will follow up, and we have given a [facility] referral, in the next few weeks.     Annita Brod, M.D.  SKK/MEDQ  D:  04/11/2011  T:  04/12/2011  Job:  [id number]  cc:   Belva Crome, M.D.  Electronically Signed by Gevena Barre M.D. on 04/12/2011 08:08:54 AM

## 2011-04-13 ENCOUNTER — Emergency Department (HOSPITAL_COMMUNITY)
Admission: EM | Admit: 2011-04-13 | Discharge: 2011-04-13 | Disposition: A | Discharge status: Home / Self Care | Payer: Self-pay | Attending: Emergency Medicine | Admitting: Emergency Medicine

## 2011-04-13 DIAGNOSIS — M545 Low back pain, unspecified: Secondary | ICD-10-CM | POA: Insufficient documentation

## 2011-04-13 DIAGNOSIS — M79609 Pain in unspecified limb: Secondary | ICD-10-CM | POA: Insufficient documentation

## 2011-04-13 DIAGNOSIS — J4489 Other specified chronic obstructive pulmonary disease: Secondary | ICD-10-CM | POA: Insufficient documentation

## 2011-04-13 DIAGNOSIS — J449 Chronic obstructive pulmonary disease, unspecified: Secondary | ICD-10-CM | POA: Insufficient documentation

## 2011-04-13 DIAGNOSIS — Z86718 Personal history of other venous thrombosis and embolism: Secondary | ICD-10-CM | POA: Insufficient documentation

## 2011-04-13 DIAGNOSIS — R209 Unspecified disturbances of skin sensation: Secondary | ICD-10-CM | POA: Insufficient documentation

## 2011-04-13 DIAGNOSIS — Z79899 Other long term (current) drug therapy: Secondary | ICD-10-CM | POA: Insufficient documentation

## 2011-04-13 DIAGNOSIS — IMO0001 Reserved for inherently not codable concepts without codable children: Secondary | ICD-10-CM | POA: Insufficient documentation

## 2011-04-13 DIAGNOSIS — F17200 Nicotine dependence, unspecified, uncomplicated: Secondary | ICD-10-CM | POA: Insufficient documentation

## 2011-04-20 ENCOUNTER — Emergency Department (HOSPITAL_COMMUNITY): Payer: Self-pay

## 2011-04-20 ENCOUNTER — Inpatient Hospital Stay (HOSPITAL_COMMUNITY)
Admission: EM | Admit: 2011-04-20 | Discharge: 2011-04-22 | DRG: 069 | Disposition: A | Discharge status: Home / Self Care | Payer: Self-pay | Attending: Internal Medicine | Admitting: Internal Medicine

## 2011-04-20 DIAGNOSIS — Z882 Allergy status to sulfonamides status: Secondary | ICD-10-CM

## 2011-04-20 DIAGNOSIS — R29898 Other symptoms and signs involving the musculoskeletal system: Secondary | ICD-10-CM | POA: Diagnosis present

## 2011-04-20 DIAGNOSIS — J4489 Other specified chronic obstructive pulmonary disease: Secondary | ICD-10-CM | POA: Diagnosis present

## 2011-04-20 DIAGNOSIS — R209 Unspecified disturbances of skin sensation: Secondary | ICD-10-CM | POA: Diagnosis present

## 2011-04-20 DIAGNOSIS — R42 Dizziness and giddiness: Secondary | ICD-10-CM | POA: Diagnosis present

## 2011-04-20 DIAGNOSIS — J449 Chronic obstructive pulmonary disease, unspecified: Secondary | ICD-10-CM | POA: Diagnosis present

## 2011-04-20 DIAGNOSIS — H538 Other visual disturbances: Secondary | ICD-10-CM | POA: Diagnosis present

## 2011-04-20 DIAGNOSIS — Z86718 Personal history of other venous thrombosis and embolism: Secondary | ICD-10-CM

## 2011-04-20 DIAGNOSIS — G459 Transient cerebral ischemic attack, unspecified: Principal | ICD-10-CM | POA: Diagnosis present

## 2011-04-20 DIAGNOSIS — F17200 Nicotine dependence, unspecified, uncomplicated: Secondary | ICD-10-CM | POA: Diagnosis present

## 2011-04-20 DIAGNOSIS — M47812 Spondylosis without myelopathy or radiculopathy, cervical region: Secondary | ICD-10-CM | POA: Diagnosis present

## 2011-04-20 DIAGNOSIS — E785 Hyperlipidemia, unspecified: Secondary | ICD-10-CM | POA: Diagnosis present

## 2011-04-20 DIAGNOSIS — E669 Obesity, unspecified: Secondary | ICD-10-CM | POA: Diagnosis present

## 2011-04-20 DIAGNOSIS — Z880 Allergy status to penicillin: Secondary | ICD-10-CM

## 2011-04-20 LAB — APTT: aPTT: 29 seconds (ref 24–37)

## 2011-04-20 LAB — CK TOTAL AND CKMB (NOT AT ARMC)
CK, MB: 2.5 ng/mL (ref 0.3–4.0)
Relative Index: 1.6 (ref 0.0–2.5)
Total CK: 160 U/L (ref 7–177)

## 2011-04-20 LAB — COMPREHENSIVE METABOLIC PANEL
ALT: 19 U/L (ref 0–35)
AST: 17 U/L (ref 0–37)
Calcium: 9.4 mg/dL (ref 8.4–10.5)
GFR calc Af Amer: >60 mL/min (ref >60)
Sodium: 141 mEq/L (ref 135–145)
Total Protein: 7.2 g/dL (ref 6.0–8.3)

## 2011-04-20 LAB — GLUCOSE, CAPILLARY: Glucose-Capillary: 96 mg/dL (ref 70–99)

## 2011-04-20 LAB — DIFFERENTIAL
Basophils Absolute: 0 10*3/uL (ref 0.0–0.1)
Basophils Relative: 1 % (ref 0–1)
Lymphocytes Relative: 41 % (ref 12–46)
Neutro Abs: 3.1 10*3/uL (ref 1.7–7.7)

## 2011-04-20 LAB — CBC
HCT: 41.7 % (ref 36.0–46.0)
Hemoglobin: 13.7 g/dL (ref 12.0–15.0)
MCHC: 32.9 g/dL (ref 30.0–36.0)
RDW: 16.4 % — ABNORMAL HIGH (ref 11.5–15.5)
WBC: 6.3 10*3/uL (ref 4.0–10.5)

## 2011-04-20 LAB — POCT I-STAT, CHEM 8
Chloride: 110 mEq/L (ref 96–112)
Glucose, Bld: 82 mg/dL (ref 70–99)
HCT: 46 % (ref 36.0–46.0)
Hemoglobin: 15.6 g/dL — ABNORMAL HIGH (ref 12.0–15.0)
Potassium: 4.1 mEq/L (ref 3.5–5.1)
Sodium: 141 mEq/L (ref 135–145)

## 2011-04-20 LAB — TROPONIN I: Troponin I: <0.3 ng/mL (ref <0.30)

## 2011-04-20 LAB — PROTIME-INR: INR: 0.95 (ref 0.00–1.49)

## 2011-04-21 ENCOUNTER — Inpatient Hospital Stay (HOSPITAL_COMMUNITY): Payer: Self-pay

## 2011-04-21 DIAGNOSIS — G459 Transient cerebral ischemic attack, unspecified: Secondary | ICD-10-CM

## 2011-04-21 LAB — CBC
HCT: 40.3 % (ref 36.0–46.0)
Hemoglobin: 13.9 g/dL (ref 12.0–15.0)
RDW: 17 % — ABNORMAL HIGH (ref 11.5–15.5)
WBC: 5.8 10*3/uL (ref 4.0–10.5)

## 2011-04-21 LAB — COMPREHENSIVE METABOLIC PANEL
ALT: 20 U/L (ref 0–35)
AST: 17 U/L (ref 0–37)
Albumin: 3.3 g/dL — ABNORMAL LOW (ref 3.5–5.2)
Alkaline Phosphatase: 110 U/L (ref 39–117)
BUN: 15 mg/dL (ref 6–23)
Chloride: 107 mEq/L (ref 96–112)
Potassium: 4.4 mEq/L (ref 3.5–5.1)
Total Bilirubin: 0.3 mg/dL (ref 0.3–1.2)

## 2011-04-21 LAB — GLUCOSE, CAPILLARY
Glucose-Capillary: 129 mg/dL — ABNORMAL HIGH (ref 70–99)
Glucose-Capillary: 93 mg/dL (ref 70–99)
Glucose-Capillary: 103 mg/dL — ABNORMAL HIGH (ref 70–99)

## 2011-04-21 NOTE — H&P (Signed)
Krystal Allen, Krystal Allen NO.:  [id number]  MEDICAL RECORD NO.:  [id number]  LOCATION:  [id number]                         FACILITY:  [facility]  PHYSICIAN:  Charlynne Cousins, M.D.DATE OF BIRTH:  1956-07-31  DATE OF ADMISSION:  04/20/2011 DATE OF DISCHARGE:                             HISTORY & PHYSICAL   PRIMARY CARE DOCTOR:  [facility].  She has not seen a doctor over at [facility].  CHIEF COMPLAINT:  Left arm weakness.  HISTORY OF PRESENT ILLNESS:  This is a 55 year old female with past medical history of hyperlipidemia that comes in with chief complaint of weakness, her history was provided by the patient.  She relates sudden onset of dizziness, sweating, and numbness in her left arm and some blurry vision.  She relates that her arm usually does not react like this.  She relates that, it stayed in the supination position whenever she puts it down.  She relates some weakness in that arm.  This started about 3-4 days ago.  She said her symptoms got worse today at 83. Therefore, she came to the ED for further evaluation.  She was seen by neurologist.  The neurologist did not call code stroke.  He got an MRI, it showed results probably low and he asked Internal Medicine to admit and do GIA workup.  The patient did not lose consciousness at any time and had some blurry vision.  Did not have any seizure-like activity.  No incontinence of bowels or urine.  Did not bite her tongue.  Did not lose consciousness at any time.  Was not short of breath or chest pain.  ALLERGIES: 1. PENICILLIN. 2. SULFA.  MEDICATIONS: 1. Ibuprofen 400 mg p.o. q.8 h p.r.n. 2. Albuterol inhaled q.4 h. p.r.n. 3. Pravachol 10 mg p.o. daily.  PAST MEDICAL HISTORY: 1. Tobacco abuse. 2. History of recently discharged from the hospital for chest pain     with a negative Myoview. 3. Early signs on chest x-ray of COPD. 4. Hyperlipidemia. 5. Obesity. 6. History of DVT 4, 5 years ago, off  Coumadin now.  SOCIAL HISTORY:  Lives in [place] with her son.  Smokes about half pack per day.  Works at [employer].  She uses alcohol regularly. Denies any drugs.  FAMILY HISTORY:  No premature history of coronary artery disease or stroke, diabetes or hypertension.  REVIEW OF SYSTEMS:  Ten-point review of system done, pertinent positives per HPI.  PHYSICAL EXAMINATION:  VITAL SIGNS:  Temperature 97, pulse 71, blood pressure 152/11.  She is satting 100% on room air, breathing 18 times per minute. GENERAL:  She is awake, alert, and oriented x3 in no acute distress. HEENT: Normocephalic, atraumatic.  Pupils are equally round and reactive to light and accommodation.  Icteric.  No jaundice.  No thyromegaly.  No bruits. CARDIOVASCULAR:  She has a regular rate and rhythm with positive S1, S2. No murmurs, rubs, or gallops. ABDOMEN:  Positive bowel sounds, nontender, nondistended.  Soft. EXTREMITIES:  Positive pulses.  No clubbing, cyanosis, or edema. LUNGS:  Good air movement.  Clear to auscultation. SKIN:  She has no ulcerations or rashes. NEUROLOGIC:  She is awake, alert, and oriented x4, coherent and  fluent language.  III through XII are grossly intact.  Sensation is intact throughout.  Muscle strength is 5/5 in all extremities except her left upper extremity which seems to be 4/5 on the biceps and forearm.  She has no loss of sensation deficit at that time.  She has painful to palpation of the neck with pain to movement of her neck.  She relates that the pain shoots down her arm.  Otherwise reflexes are all intact. Finger-to-nose is normal.  IMAGING DATA:  On admission show head MRI negative for any acute abnormality, mild sinusitis.  CT head showed normal CT with no acute intracranial abnormalities.  LABORATORY DATA:  On admission shows white count of 6.3, hemoglobin of 13, MCV of 88, RDW of 16, platelet count 214, and ANC of 3.1.  Her INR was 0.9.  Her sodium is 141,  potassium 4.1, chloride 107, bicarb 27, glucose of 82, BUN of 14, and creatinine 1.4.  LFTs within normal limits.  Her first set of cardiac enzymes were negative x1.  EKG shows sinus bradycardia with no T-wave inversion.  Normal axis and normal intervals.  ASSESSMENT/PLAN: 1. Transient ischemic attack.  MRI negative.  CT scan shows no acute     intracranial abnormalities.  We will get a 2-D echo to evaluate     structure of her heart.  Also at this time, we will get an MRI of     the neck to rule out any cervical radiculopathy as this is     concerning due to her physical exam.  We will check an ESR, TSH,     B12, and a UDS.  We will also get a carotid Doppler to finish the     workup for transient ischemic attack to rule out any stenosed     lesions.  At this time, I have discussed with the patient her risk     and benefits and she understands. 2. Tobacco abuse counseling.  If needed nicotine patch. 3. Hyperlipidemia.  Continue Pravachol.  At this time, we will admit     her for 23-hour observation and finish the workup of transient     ischemic attack.     Charlynne Cousins, M.D.     AF/MEDQ  D:  04/20/2011  T:  04/20/2011  Job:  [id number]  cc:   [facility]  Electronically Signed by Charlynne Cousins M.D. on 04/21/2011 [id number] PM

## 2011-04-21 NOTE — Consult Note (Signed)
NAMEMarland Kitchen  JARIKA, AGUINAGA NO.:  [id number]  MEDICAL RECORD NO.:  [id number]  LOCATION:  [id number]                         FACILITY:  [facility]  PHYSICIAN:  Alexis Goodell, MD    DATE OF BIRTH:  Apr 21, 1956  DATE OF CONSULTATION:  04/20/2011 DATE OF DISCHARGE:                                CONSULTATION   HISTORY OF PRESENT ILLNESS:  Ms. Krystal Allen is a 55 year old female who on arriving at work today got out of her car and acutely experienced dizziness with blurred vision and left upper extremity numbness.  This was at 10:55 this morning.  The patient was brought in as a code stroke for further evaluation.  It should be noted that the patient had a similar episode of left upper extremity numbness on April 11, 2011, and was admitted at that time for cardiac workup that was unremarkable.  PAST MEDICAL HISTORY:  Hypercholesterolemia, DVT in the past.  MEDICATIONS:  Pravachol and albuterol.  ALLERGIES:  PENICILLIN, SULFA.  SOCIAL HISTORY:  The patient smokes.  She smokes half pack to full pack a day of cigarettes.  There is no history of alcohol or illicit drug abuse.  PHYSICAL EXAMINATION:  VITAL SIGNS:  Blood pressure 152/111, heart rate 71, respiratory rate 18, O2 sat 100% on room air. MENTAL STATUS:  On mental status testing, the patient is alert and oriented.  She can follow commands without difficulty.  Speech is fluent.  On cranial nerve testing:  II, visual fields grossly intact but the patient complained of blurred vision out of the right eye.  No field cut are noted.  III, IV, VI; extraocular movements intact.  V and VII, smile symmetric.  There is decrease pinprick on the left side of the face.  VIII, grossly intact.  IX and X, positive gag.  XI, bilateral shoulder shrug.  XII, midline tongue extension.  On motor exam, the patient is 5/5 throughout.  There is normal tone and bulk.  SENSORY: The patient reports decreased pinprick and light touch on the left  upper extremity.  Deep tendon reflexes are 1+ in the upper extremities and 0 on the right lower extremity, 2+ on the left lower extremity.  Plantars are downgoing bilaterally.  On cerebellar testing, finger-to-nose and heel-to-shin intact.  Platelet count  214, white blood cell count 6.3, hemoglobin 13.7, hematocrit 41.7.  Sodium 141, potassium 4.1, chloride 110, bicarb 23, BUN and creatinine 15 and 1.2 respectively.  Glucose 82. PT 12.9, INR 0.95, PTT 29.  Head CT reviewed and is unremarkable.  ASSESSMENT:  Ms. [name] is a 55 year old female who presents with left- sided sensory complaints and blurring of vision of the right eye.  Acute infarct remains in the differential.  The patient does have risk factors for small vessel disease to include hypercholesterolemia and smoking. Symptoms are mild at this point with the NIH stroke scale of 1 and therefore TPA is not to be administered.  Further workup is indicated though.  PLAN: 1. MRI of the brain. 2. Echo and carotid. 3. Aspirin 325 mg p.o. daily to be used for stroke prophylaxis. 4. ESR 5. Tobacco cessation counseling  The patient is considered critically ill and has  a high probability of further neurological decompensation.  One hour was spent in face-to-face management and consultation with the patient.  High-level decision making was required in management of the patient.          ______________________________ Alexis Goodell, MD     LR/MEDQ  D:  04/20/2011  T:  04/20/2011  Job:  [id number]  Electronically Signed by Alexis Goodell MD on 04/21/2011 05:40:20 PM

## 2011-04-22 LAB — GLUCOSE, CAPILLARY: Glucose-Capillary: 108 mg/dL — ABNORMAL HIGH (ref 70–99)

## 2011-04-25 NOTE — Discharge Summary (Signed)
  NAME:  Krystal Allen, SCHERSCHEL NO.:  [id number]  MEDICAL RECORD NO.:  [id number]  LOCATION:                                 FACILITY:  PHYSICIAN:  Charlynne Cousins, M.D.DATE OF BIRTH:  September 21, 1956  DATE OF ADMISSION: DATE OF DISCHARGE:                              DISCHARGE SUMMARY   PRIMARY CARE DOCTOR:  [facility].  DISCHARGE DIAGNOSES: 1. Cervical radiculopathy. 2. Tobacco abuse. 3. Hyperlipidemia.  DISCHARGE MEDICATIONS: 1. Tylenol 650 mg q.4 h. p.r.n. 2. Ibuprofen 400 mg q.8 h. p.r.n. 3. Prilosec 40 mg daily. 4. Albuterol inhaled 2 puffs q.4 h. p.r.n. 5. Pravachol 10 mg daily.  PROCEDURES PERFORMED:  MRI of spine that showed small broad-based disk osteophyte complex, mild spinal stenosis, uncinate hypertrophy greater on the left, moderate left-sided mild right-sided foraminal narrowing. Brain MRI showed no acute intracranial findings.  CT of head showed normal CT scan.  BRIEF ADMITTING HISTORY AND PHYSICAL:  This is a 55 year old female with past medical history for hyperlipidemia who comes in with a chief complaint of weakness.  The history was per the patient.  She had sudden onset of dizziness, sweating, numbness in her left arm, and some blurry vision.  She relates that her arm usually is not like this.  She relates that it stayed a kind of numb in the supination position at times.  So, we were asked to admit and further evaluate for rule out stroke.  Please refer to dictation from April 20, 2011, for further details.  ASSESSMENT/PLAN:  Cervical radiculopathy with left arm numbness.  She was admitted for transient ischemic attack, stroke workup.  It came negative.  MRI was negative as above.  MRI of the head was negative as above.  MRI of the neck was done that showed results above.  She was treated with pain medication.  Her numbness likely continued to improve and she will have to follow up with Neurosurgery as an outpatient.  The patient at  this time relates that her pain has improved and her numbness has significantly improved.  So, we have informed her that she needs to follow up with neurosurgeon as an outpatient.  If it worsens, then she needs to come to the hospital.  On day of discharge, her temperature is 98, pulse of 58, respirations 18, blood pressure 132/80.  She was saturating 96% on room air.  LABORATORY DATA ON DAY OF DISCHARGE:  None.     Charlynne Cousins, M.D.     AF/MEDQ  D:  04/22/2011  T:  04/22/2011  Job:  [id number]  Electronically Signed by Charlynne Cousins M.D. on 04/25/2011 04:14:34 PM

## 2011-05-05 NOTE — Consult Note (Signed)
  NAMEMarland Allen  CARMINE, ESHLEMAN NO.:  [id number]  MEDICAL RECORD NO.:  [id number]  LOCATION:  2031                         FACILITY:  [facility]  PHYSICIAN:  Belva Crome, M.D.   DATE OF BIRTH:  01-01-1956  DATE OF CONSULTATION:  04/11/2011 DATE OF DISCHARGE:  04/11/2011                                CONSULTATION   REASON FOR CONSULTATION:  Left arm and chest discomfort.  CONCLUSIONS: 1. Left arm dysesthesias and left subclavicular chest discomfort,     continuous of uncertain etiology.  Unlikely to be cardiac.  The     patient does have risk factors including smoking, and age. 2. Hyperlipidemia. 3. Tobacco abuse. 4. Prior history of DVT.     a.     Pulmonary embolism excluded on admission with CT angio of      the lungs.  RECOMMENDATIONS: 1. Agree with Lovenox. 2. Aspirin as you have prescribed. 3. Stress Cardiolite study to rule out evidence of myocardial     ischemia.  COMMENT:  The patient is 75 and since Friday, April 08, 2011, she has been having some left arm discomfort.  The discomfort radiates from the shoulder to the hand.  The whole arm is involved.  The discomfort is painful and at the same time somewhat warm and tingling.  The discomfort became severe on April 10, 2011, and there was associated left subclavicular and shoulder discomfort.  There was no associated dyspnea. There was some mild diaphoresis.  She did not have palpitations.  There was no prior history of similar discomfort.  ALLERGIES:  PENICILLIN and SULFA.  MEDICATIONS:  As an outpatient include Xanax.  FAMILY HISTORY:  Significant for diabetes and hypertension.  HOME MEDICATIONS:  As needed BC head powder.  HABITS:  One-half to one pack of cigarettes per day for up to 15 years. Coffee 6-8 cups per day.  PHYSICAL EXAMINATION:  GENERAL:  The patient is obese. VITAL SIGNS:  Blood pressure is mildly elevated at 150/85, heart rate is 52. NECK:  There are no carotid bruits and there is  no JVD. LUNGS:  Clear to auscultation and percussion. CARDIAC:  No murmur, no click, no rub, no gallop. ABDOMEN:  Soft. EXTREMITIES:  No edema.  LABORATORY DATA:  Of significance reveals an LDL cholesterol of 144, creatinine of 1.11, hemoglobin is 13.3.  White count 5100.  CT angio of the lungs does not reveal evidence of coronary calcification and was negative for PE and echocardiogram is pending.  Chest x-ray revealed chronic bronchitic changes but no cardiomegaly.  DISCUSSION:  This is a low to intermediate probability history in a female with nonspecific EKG changes.  Under the circumstances, a stress nuclear study is a reasonable manner by which to risk stratify this patient.  We need to encourage risk factor modification no matter what we find with this evaluation.     Belva Crome, M.D.     HWS/MEDQ  D:  04/11/2011  T:  04/12/2011  Job:  [id number]  Electronically Signed by Daneen Schick M.D. on 05/05/2011 01:36:41 PM

## 2011-05-13 ENCOUNTER — Emergency Department (HOSPITAL_COMMUNITY)
Admission: EM | Admit: 2011-05-13 | Discharge: 2011-05-13 | Disposition: A | Discharge status: Home / Self Care | Payer: Self-pay | Attending: Emergency Medicine | Admitting: Emergency Medicine

## 2011-05-13 DIAGNOSIS — I10 Essential (primary) hypertension: Secondary | ICD-10-CM | POA: Insufficient documentation

## 2011-05-13 DIAGNOSIS — J4489 Other specified chronic obstructive pulmonary disease: Secondary | ICD-10-CM | POA: Insufficient documentation

## 2011-05-13 DIAGNOSIS — R42 Dizziness and giddiness: Secondary | ICD-10-CM | POA: Insufficient documentation

## 2011-05-13 DIAGNOSIS — Z86718 Personal history of other venous thrombosis and embolism: Secondary | ICD-10-CM | POA: Insufficient documentation

## 2011-05-13 DIAGNOSIS — J449 Chronic obstructive pulmonary disease, unspecified: Secondary | ICD-10-CM | POA: Insufficient documentation

## 2011-06-10 ENCOUNTER — Ambulatory Visit (HOSPITAL_COMMUNITY)
Admission: RE | Admit: 2011-06-10 | Discharge: 2011-06-10 | Disposition: A | Discharge status: Home / Self Care | Payer: Self-pay | Source: Ambulatory Visit | Attending: Family Medicine | Admitting: Family Medicine

## 2011-06-10 ENCOUNTER — Other Ambulatory Visit (HOSPITAL_COMMUNITY): Payer: Self-pay | Admitting: Family Medicine

## 2011-06-10 DIAGNOSIS — R0602 Shortness of breath: Secondary | ICD-10-CM | POA: Insufficient documentation

## 2011-06-10 DIAGNOSIS — J40 Bronchitis, not specified as acute or chronic: Secondary | ICD-10-CM

## 2011-06-10 DIAGNOSIS — J42 Unspecified chronic bronchitis: Secondary | ICD-10-CM | POA: Insufficient documentation

## 2011-07-25 LAB — FUNGUS CULTURE W SMEAR

## 2011-07-25 LAB — CULTURE, ROUTINE-ABSCESS

## 2011-08-01 ENCOUNTER — Ambulatory Visit: Payer: Self-pay | Attending: Neurosurgery

## 2011-08-01 DIAGNOSIS — M2560 Stiffness of unspecified joint, not elsewhere classified: Secondary | ICD-10-CM | POA: Insufficient documentation

## 2011-08-01 DIAGNOSIS — M2550 Pain in unspecified joint: Secondary | ICD-10-CM | POA: Insufficient documentation

## 2011-08-01 DIAGNOSIS — IMO0001 Reserved for inherently not codable concepts without codable children: Secondary | ICD-10-CM | POA: Insufficient documentation

## 2011-08-05 ENCOUNTER — Ambulatory Visit: Payer: Self-pay | Admitting: Rehabilitation

## 2011-08-09 ENCOUNTER — Ambulatory Visit: Payer: Self-pay

## 2011-08-12 ENCOUNTER — Ambulatory Visit: Payer: Self-pay

## 2011-08-17 ENCOUNTER — Ambulatory Visit: Payer: Self-pay

## 2011-08-22 ENCOUNTER — Encounter: Payer: Self-pay | Admitting: Rehabilitation

## 2011-08-26 ENCOUNTER — Encounter: Payer: Self-pay | Admitting: Rehabilitation

## 2012-01-16 ENCOUNTER — Encounter (INDEPENDENT_AMBULATORY_CARE_PROVIDER_SITE_OTHER): Payer: Self-pay | Admitting: General Surgery

## 2012-01-16 ENCOUNTER — Ambulatory Visit (INDEPENDENT_AMBULATORY_CARE_PROVIDER_SITE_OTHER): Payer: PRIVATE HEALTH INSURANCE | Admitting: General Surgery

## 2012-01-16 VITALS — BP 113/69 | HR 78 | Temp 97.4°F | Ht 68.0 in | Wt 255.6 lb

## 2012-01-16 DIAGNOSIS — K649 Unspecified hemorrhoids: Secondary | ICD-10-CM

## 2012-01-16 DIAGNOSIS — K643 Fourth degree hemorrhoids: Secondary | ICD-10-CM

## 2012-01-16 NOTE — Patient Instructions (Signed)
Drink 6 large glasses of water or Gatorade a day.  Take Metamucil twice a day with a large glass of water.  High fiber, low fat diet is very important.  Read the hemorrhoid book that I gave you.  Used the hemorrhoid cream prescription that I gave you. Put this up in-side and a little bit on the outside 3 times a day.  If you do not feel much better in 3 weeks, call and return for a followup appointment.  You should have a screening colonoscopy for colorectal cancer screening. [facility] can arrange this for you.

## 2012-01-16 NOTE — Progress Notes (Signed)
Patient ID: Krystal Allen, female   DOB: July 28, 1956, 56 y.o.   MRN: [id number]  Chief Complaint  Patient presents with  . Pre-op Exam    eval throm hems    HPI Krystal Allen is a 56 y.o. female.  She is referred by Dr. Aldona Bar at [facility] for evaluation of hemorrhoids.  The patient has some comorbidities including obesity, hypertension, asthma, hyperlipidemia, and GERD.  She states that she had some type of hemorrhoid operation about 10 years ago in [place].  . She says it was for hemorrhoids. She said she had stitches and she stayed overnight in the hospital.  Her current problem started 4 or 5 days ago where she had a hard bowel movement then and had pain and bleeding and prolapse. She has been using a cortisone cream this sit and pain pills since that time. She's been having bowel movements. HPI  Past Medical History  Diagnosis Date  . Hypertension   . Hyperlipidemia   . COPD (chronic obstructive pulmonary disease)     Past Surgical History  Procedure Date  . Hemorrhoid surgery 31yrs agao  . Abdominal hysterectomy 88yrs ago    Family History  Problem Relation Age of Onset  . Cancer Father     prostate    Social History History  Substance Use Topics  . Smoking status: Current Everyday Smoker -- 0.2 packs/day    Types: Cigarettes  . Smokeless tobacco: Not on file  . Alcohol Use: 0.0 oz/week    1-2 Glasses of wine per week     occ    Allergies  Allergen Reactions  . Penicillins   . Sulfa Antibiotics     Current Outpatient Prescriptions  Medication Sig Dispense Refill  . albuterol (PROVENTIL) (2.5 MG/3ML) 0.083% nebulizer solution Take 2.5 mg by nebulization every 6 (six) hours as needed.      Krystal Allen lisinopril-hydrochlorothiazide (PRINZIDE,ZESTORETIC) 20-12.5 MG per tablet Take 1 tablet by mouth daily.      Krystal Allen omeprazole (PRILOSEC) 40 MG capsule Take 40 mg by mouth daily.        Review of Systems Review of Systems  Constitutional: Negative for  fever, chills and unexpected weight change.  HENT: Negative for hearing loss, congestion, sore throat, trouble swallowing and voice change.   Eyes: Negative for visual disturbance.  Respiratory: Negative for cough and wheezing.   Cardiovascular: Negative for chest pain, palpitations and leg swelling.  Gastrointestinal: Positive for constipation, anal bleeding and rectal pain. Negative for nausea, vomiting, abdominal pain, diarrhea, blood in stool and abdominal distention.  Genitourinary: Negative for hematuria, vaginal bleeding and difficulty urinating.  Musculoskeletal: Negative for arthralgias.  Skin: Negative for rash and wound.  Neurological: Negative for seizures, syncope and headaches.  Hematological: Negative for adenopathy. Does not bruise/bleed easily.  Psychiatric/Behavioral: Negative for confusion.    Blood pressure 113/69, pulse 78, temperature 97.4 F (36.3 C), temperature source Temporal, height 5\' 8"  (1.727 m), weight 255 lb 9.6 oz (115.939 kg), SpO2 95.00%.  Physical Exam Physical Exam  Constitutional: She is oriented to person, place, and time. She appears well-developed and well-nourished. No distress.  HENT:  Head: Normocephalic and atraumatic.  Nose: Nose normal.  Mouth/Throat: No oropharyngeal exudate.  Eyes: Conjunctivae and EOM are normal. Pupils are equal, round, and reactive to light. Left eye exhibits no discharge. No scleral icterus.  Neck: Neck supple. No JVD present. No tracheal deviation present. No thyromegaly present.  Cardiovascular: Normal rate, regular rhythm, normal heart sounds and intact  distal pulses.   No murmur heard. Pulmonary/Chest: Effort normal and breath sounds normal. No respiratory distress. She has no wheezes. She has no rales. She exhibits no tenderness.  Abdominal: Soft. Bowel sounds are normal. She exhibits no distension and no mass. There is no tenderness. There is no rebound and no guarding.  Genitourinary:       She has  circumferential internal and external hemorrhoids. The internal hemorrhoids prolapse a little bit and she is too tender to reduce these. I do not see any thrombosis. I do not see any evidence of infection or cellulitis. The skin is otherwise healthy. She was too tender for digital exam. She was too tender for endoscopy. I cannot rule out anal fissure.  Musculoskeletal: She exhibits no edema and no tenderness.  Lymphadenopathy:    She has no cervical adenopathy.  Neurological: She is alert and oriented to person, place, and time. She exhibits normal muscle tone. Coordination normal.  Skin: Skin is warm. No rash noted. She is not diaphoretic. No erythema. No pallor.  Psychiatric: She has a normal mood and affect. Her behavior is normal. Judgment and thought content normal.    Data Reviewed  I reviewed a brief note from [employer]. Assessment    Internal and external hemorrhoids with prolapse that cannot be reduced manually.  Constipation  Past history hemorrhoid surgery, details unknown  Hypertension  Asthma  Obesity with BMI 39  Hyperlipidemia.   Plan    Initial approach to treatment will be medical, since she is too tender for any type of internal exam.  Metamucil twice daily. 6 large glasses of water or Gatorade daily. Low-fat high-fiber diet.  Analpram-HC 2.5% cream internally and externally 3 times daily  Warm tub baths  Hemorrhoid book and instructions given  Return if symptoms are not significantly improved in 3 weeks. If she continues to be symptomatic and /or continues to bleed she will need examination under anesthesia.  She was advised to ask for a referral for colorectal cancer screening and colonoscopy through the ulcer diminished.       Edsel Petrin. Dalbert Batman, M.D., [facility] General and Minimally invasive Surgery Breast and Colorectal Surgery Office:   [phone] Pager:   [phone]  01/16/2012, 3:20 PM

## 2012-01-20 ENCOUNTER — Telehealth (INDEPENDENT_AMBULATORY_CARE_PROVIDER_SITE_OTHER): Payer: Self-pay | Admitting: General Surgery

## 2012-01-20 NOTE — Telephone Encounter (Signed)
Pt calling to request less-expensive medication.  She saw Dr. Dalbert Batman on Monday and  she cannot afford the $56 med that was written for her to use.

## 2012-01-23 ENCOUNTER — Telehealth (INDEPENDENT_AMBULATORY_CARE_PROVIDER_SITE_OTHER): Payer: Self-pay

## 2012-01-23 ENCOUNTER — Telehealth (INDEPENDENT_AMBULATORY_CARE_PROVIDER_SITE_OTHER): Payer: Self-pay | Admitting: General Surgery

## 2012-01-23 NOTE — Telephone Encounter (Signed)
Pt is calling again to see if less-expensive medication can be called in for her.  She cannot afford  The $56 to get the one originally ordered.

## 2012-01-23 NOTE — Telephone Encounter (Signed)
Pt advised her rx was written for analpram hc as a generic. Pt advised that she may be able to call her pcp and go thru [facility] to get her med at a cheaper rate. Pt states she will call Dr Tomma Lightning at [facility].

## 2012-07-23 ENCOUNTER — Emergency Department (HOSPITAL_COMMUNITY)
Admission: EM | Admit: 2012-07-23 | Discharge: 2012-07-23 | Disposition: A | Discharge status: Home / Self Care | Payer: Self-pay | Attending: Emergency Medicine | Admitting: Emergency Medicine

## 2012-07-23 ENCOUNTER — Emergency Department (HOSPITAL_COMMUNITY): Payer: Self-pay

## 2012-07-23 ENCOUNTER — Encounter (HOSPITAL_COMMUNITY): Payer: Self-pay | Admitting: *Deleted

## 2012-07-23 ENCOUNTER — Other Ambulatory Visit: Payer: Self-pay

## 2012-07-23 DIAGNOSIS — R079 Chest pain, unspecified: Secondary | ICD-10-CM | POA: Insufficient documentation

## 2012-07-23 DIAGNOSIS — M79609 Pain in unspecified limb: Secondary | ICD-10-CM | POA: Insufficient documentation

## 2012-07-23 DIAGNOSIS — Z79899 Other long term (current) drug therapy: Secondary | ICD-10-CM | POA: Insufficient documentation

## 2012-07-23 DIAGNOSIS — I10 Essential (primary) hypertension: Secondary | ICD-10-CM | POA: Insufficient documentation

## 2012-07-23 DIAGNOSIS — M792 Neuralgia and neuritis, unspecified: Secondary | ICD-10-CM

## 2012-07-23 DIAGNOSIS — M47812 Spondylosis without myelopathy or radiculopathy, cervical region: Secondary | ICD-10-CM | POA: Insufficient documentation

## 2012-07-23 DIAGNOSIS — R209 Unspecified disturbances of skin sensation: Secondary | ICD-10-CM | POA: Insufficient documentation

## 2012-07-23 DIAGNOSIS — M62838 Other muscle spasm: Secondary | ICD-10-CM | POA: Insufficient documentation

## 2012-07-23 DIAGNOSIS — M5412 Radiculopathy, cervical region: Secondary | ICD-10-CM | POA: Insufficient documentation

## 2012-07-23 LAB — CBC WITH DIFFERENTIAL/PLATELET
Hemoglobin: 13.3 g/dL (ref 12.0–15.0)
Lymphs Abs: 2.5 10*3/uL (ref 0.7–4.0)
Monocytes Relative: 5 % (ref 3–12)
Neutro Abs: 2.6 10*3/uL (ref 1.7–7.7)
Neutrophils Relative %: 46 % (ref 43–77)
Platelets: 214 10*3/uL (ref 150–400)
RBC: 4.3 MIL/uL (ref 3.87–5.11)
WBC: 5.7 10*3/uL (ref 4.0–10.5)

## 2012-07-23 LAB — COMPREHENSIVE METABOLIC PANEL
ALT: 14 U/L (ref 0–35)
Albumin: 3.6 g/dL (ref 3.5–5.2)
Alkaline Phosphatase: 93 U/L (ref 39–117)
BUN: 14 mg/dL (ref 6–23)
Chloride: 107 mEq/L (ref 96–112)
GFR calc Af Amer: 59 mL/min — ABNORMAL LOW (ref >90)
Glucose, Bld: 97 mg/dL (ref 70–99)
Potassium: 3.8 mEq/L (ref 3.5–5.1)
Sodium: 143 mEq/L (ref 135–145)
Total Bilirubin: 0.2 mg/dL — ABNORMAL LOW (ref 0.3–1.2)

## 2012-07-23 LAB — TROPONIN I: Troponin I: <0.3 ng/mL (ref <0.30)

## 2012-07-23 MED ORDER — CYCLOBENZAPRINE HCL 10 MG PO TABS: 5.0000 mg | ORAL_TABLET | Freq: Two times a day (BID) | ORAL | Status: DC | PRN

## 2012-07-23 MED ORDER — IBUPROFEN 400 MG PO TABS
600.0000 mg | ORAL_TABLET | Freq: Once | ORAL | Status: AC
  Administered 2012-07-23: 600 mg via ORAL
  Filled 2012-07-23: qty 1

## 2012-07-23 MED ORDER — OXYCODONE-ACETAMINOPHEN 5-325 MG PO TABS
1.0000 | ORAL_TABLET | Freq: Once | ORAL | Status: AC
  Administered 2012-07-23: 1 via ORAL
  Filled 2012-07-23: qty 1

## 2012-07-23 MED ORDER — HYDROCODONE-ACETAMINOPHEN 5-500 MG PO TABS: 1.0000 | ORAL_TABLET | Freq: Four times a day (QID) | ORAL | Status: DC | PRN

## 2012-07-23 NOTE — ED Notes (Signed)
Patient transported to CT 

## 2012-07-23 NOTE — ED Notes (Signed)
The pt is hyperventilating

## 2012-07-23 NOTE — ED Notes (Signed)
The pt is c/o lt arm pain  Since last pm and she has numbness in her lt arm from the elbow down since last pm with some lt flank pain  And  Nausea.

## 2012-07-23 NOTE — ED Provider Notes (Signed)
History     CSN: [id number]  Arrival date & time 07/23/12  1615   First MD Initiated Contact with Patient 07/23/12 2009      Chief Complaint  Patient presents with  . Arm Pain    (Consider location/radiation/quality/duration/timing/severity/associated sxs/prior treatment) HPI Comments: Arm pain is increased with movement.  It lasts greater than 1 minute.  She denied chest pain or shortness of breath, but does have posterior left back pain.  She has taken Motrin, and aspirin with no relief.  He hasn't history of COPD, and hypertension.  She, [facility] patient is one-month left of her medication.  She has made no attempt to find a new PCP  Patient is a 56 y.o. female presenting with arm pain. The history is provided by the patient.  Arm Pain This is a new problem. The current episode started yesterday. The problem has been unchanged. Associated symptoms include chest pain and numbness. Pertinent negatives include no arthralgias, chills, congestion, fever, headaches, joint swelling, myalgias, nausea, neck pain, rash or weakness.    Past Medical History  Diagnosis Date  . Hypertension   . Hyperlipidemia   . COPD (chronic obstructive pulmonary disease)     Past Surgical History  Procedure Date  . Hemorrhoid surgery 56yrs agao  . Abdominal hysterectomy 46yrs ago    Family History  Problem Relation Age of Onset  . Cancer Father     prostate    History  Substance Use Topics  . Smoking status: Current Every Day Smoker -- 0.2 packs/day    Types: Cigarettes  . Smokeless tobacco: Not on file  . Alcohol Use: 0.0 oz/week    1-2 Glasses of wine per week     occ    OB History    Grav Para Term Preterm Abortions TAB SAB Ect Mult Living                  Review of Systems  Constitutional: Negative for fever and chills.  HENT: Negative for congestion and neck pain.   Respiratory: Negative for shortness of breath.   Cardiovascular: Positive for chest pain.  Negative for leg swelling.  Gastrointestinal: Negative for nausea.  Musculoskeletal: Negative for myalgias, joint swelling and arthralgias.  Skin: Negative for rash and wound.  Neurological: Positive for numbness. Negative for dizziness, weakness and headaches.    Allergies  Fish allergy; Penicillins; and Sulfa antibiotics  Home Medications   Current Outpatient Rx  Name Route Sig Dispense Refill  . ALBUTEROL SULFATE HFA 108 (90 BASE) MCG/ACT IN AERS Inhalation Inhale 2 puffs into the lungs every 6 (six) hours as needed. For shortness of breath/wheezing    . ALBUTEROL SULFATE (2.5 MG/3ML) 0.083% IN NEBU Nebulization Take 2.5 mg by nebulization every 6 (six) hours as needed. For shortness of breath/wheezing    . ASPIRIN EC 81 MG PO TBEC Oral Take 81 mg by mouth once.    Marland Kitchen LISINOPRIL-HYDROCHLOROTHIAZIDE 20-12.5 MG PO TABS Oral Take 1 tablet by mouth daily.    Marland Kitchen OMEPRAZOLE 40 MG PO CPDR Oral Take 40 mg by mouth daily.    . CYCLOBENZAPRINE HCL 10 MG PO TABS Oral Take 0.5 tablets (5 mg total) by mouth 2 (two) times daily as needed for muscle spasms. 20 tablet 0  . HYDROCODONE-ACETAMINOPHEN 5-500 MG PO TABS Oral Take 1 tablet by mouth every 6 (six) hours as needed for pain. 15 tablet 0    BP 108/63  Pulse 53  Temp 98.1  F (36.7 C) (Oral)  Resp 19  SpO2 100%  Physical Exam  Constitutional: She appears well-developed and well-nourished.  HENT:  Head: Normocephalic.  Eyes: Pupils are equal, round, and reactive to light.  Neck: Normal range of motion.  Cardiovascular: Normal rate.   Pulmonary/Chest: Effort normal. No respiratory distress. She has no wheezes.  Abdominal: Soft. She exhibits no distension. There is no tenderness.  Musculoskeletal: Normal range of motion. She exhibits tenderness. She exhibits no edema.       Arms:   ED Course  Procedures (including critical care time)  Labs Reviewed  CBC WITH DIFFERENTIAL - Abnormal; Notable for the following:    RDW 16.5 (*)      All other components within normal limits  COMPREHENSIVE METABOLIC PANEL - Abnormal; Notable for the following:    Creatinine, Ser 1.18 (*)     Total Bilirubin 0.2 (*)     GFR calc non Af Amer 51 (*)     GFR calc Af Amer 59 (*)     All other components within normal limits  TROPONIN I   Dg Chest 2 View  07/23/2012  *RADIOLOGY REPORT*  Clinical Data: Left arm numbness and weakness.  CHEST - 2 VIEW  Comparison: 06/10/2011  Findings: Two views of the chest demonstrate coarse lung markings and chronic peribronchial thickening.  There is no focal airspace disease.  Heart size is normal.  Trachea is midline.  No evidence for pleural effusions.  IMPRESSION: Evidence for chronic bronchitic changes without focal disease.   Original Report Authenticated By: Markus Daft, M.D.    Ct Head Wo Contrast  07/23/2012  *RADIOLOGY REPORT*  Clinical Data:  Right arm pain, numbness and weakness. Technologist comments state left arm pain.  Please clinically correlate.  CT HEAD WITHOUT CONTRAST CT CERVICAL SPINE WITHOUT CONTRAST  Technique:  Multidetector CT imaging of the head and cervical spine was performed following the standard protocol without intravenous contrast.  Multiplanar CT image reconstructions of the cervical spine were also generated.  Comparison:  MRI cervical 04/21/2011.  CT HEAD  Findings: No mass lesion, mass effect, midline shift, hydrocephalus, hemorrhage.  No territorial ischemia or acute infarction.  The paranasal sinuses appear within normal limits. Calvarium intact.  Benign basal ganglia calcifications are incidentally noted.  IMPRESSION: Negative CT head.  CT CERVICAL SPINE  Findings: Straightening of the normal cervical lordosis.  Between 1 mm and 2 ml anterolisthesis of C4 on C5 appears degenerative. There is no critical central stenosis.  Mild central stenosis is present associated with disc osteophyte complex at C5-C6. Multilevel uncovertebral spurring is present with foraminal encroachment most  pronounced bilaterally at C4-C5 and on the left at C6-C7 as well as the both sides at C5-C6.  Allowing for differences in technique, there is no significant interval change from 04/21/2011 MRI.  No cervical spine fracture.  T1-T2 left-sided facet arthrosis.  IMPRESSION: Multilevel cervical spondylosis appears similar to prior MRI 04/21/2011.  Straightening and slight reversal of the normal lordosis is probably positional or secondary to spasm.  No acute abnormality.   Original Report Authenticated By: Dereck Ligas, M.D.    Ct Cervical Spine Wo Contrast  07/23/2012  *RADIOLOGY REPORT*  Clinical Data:  Right arm pain, numbness and weakness. Technologist comments state left arm pain.  Please clinically correlate.  CT HEAD WITHOUT CONTRAST CT CERVICAL SPINE WITHOUT CONTRAST  Technique:  Multidetector CT imaging of the head and cervical spine was performed following the standard protocol without intravenous contrast.  Multiplanar CT  image reconstructions of the cervical spine were also generated.  Comparison:  MRI cervical 04/21/2011.  CT HEAD  Findings: No mass lesion, mass effect, midline shift, hydrocephalus, hemorrhage.  No territorial ischemia or acute infarction.  The paranasal sinuses appear within normal limits. Calvarium intact.  Benign basal ganglia calcifications are incidentally noted.  IMPRESSION: Negative CT head.  CT CERVICAL SPINE  Findings: Straightening of the normal cervical lordosis.  Between 1 mm and 2 ml anterolisthesis of C4 on C5 appears degenerative. There is no critical central stenosis.  Mild central stenosis is present associated with disc osteophyte complex at C5-C6. Multilevel uncovertebral spurring is present with foraminal encroachment most pronounced bilaterally at C4-C5 and on the left at C6-C7 as well as the both sides at C5-C6.  Allowing for differences in technique, there is no significant interval change from 04/21/2011 MRI.  No cervical spine fracture.  T1-T2 left-sided facet  arthrosis.  IMPRESSION: Multilevel cervical spondylosis appears similar to prior MRI 04/21/2011.  Straightening and slight reversal of the normal lordosis is probably positional or secondary to spasm.  No acute abnormality.   Original Report Authenticated By: Dereck Ligas, M.D.      1. Neuropathic pain of upper extremity   2. Spasm of cervical paraspinous muscle     ED ECG REPORT   Date: 07/23/2012  EKG Time: 10:36 PM  Rate: 65  Rhythm: normal sinus rhythm,  unchanged from previous tracings  Axis right  Intervals:none  ST&T Change: none  Narrative Interpretation: borderline             MDM   I believe this patient's pain is cervical radiculopathy from her spondylosis and is been noted previously on MRI, as well as today's CT scan of her cervical spine.  I have chosen to treat her with Flexeril, and hydrocodone have also provided her with a list of medical providers accepting the patient.  As she lost her primary care provider through the closing of [facility]        Garald Balding, NP 07/23/12 2235  Garald Balding, NP 07/23/12 2236

## 2012-07-24 NOTE — ED Provider Notes (Signed)
Medical screening examination/treatment/procedure(s) were conducted as a shared visit with non-physician practitioner(s) and myself.  I personally evaluated the patient during the encounter  Varney Biles, MD 07/24/12 [phone]

## 2012-07-24 NOTE — ED Notes (Signed)
NP notified of pain and meds requested.

## 2012-07-24 NOTE — ED Notes (Signed)
Pt states it will take her 15 minutes to drive home.  Notified that she must go straight home d/t medication causing drowsiness.  Pt stated that she would.

## 2012-07-24 NOTE — ED Notes (Signed)
Pt here for L arm pain.  Pt is angry b/c she has been waiting for 4 hours and wants to know when she is going home.  Pt notified of plan - CT.

## 2012-10-02 IMAGING — CR DG CHEST 2V
2 series · 2 of 2 positions shown · non-contrast
Comparison: 05/07/2009

CLINICAL DATA: Chest pain

CHEST - 2 VIEW

[w chest pa]
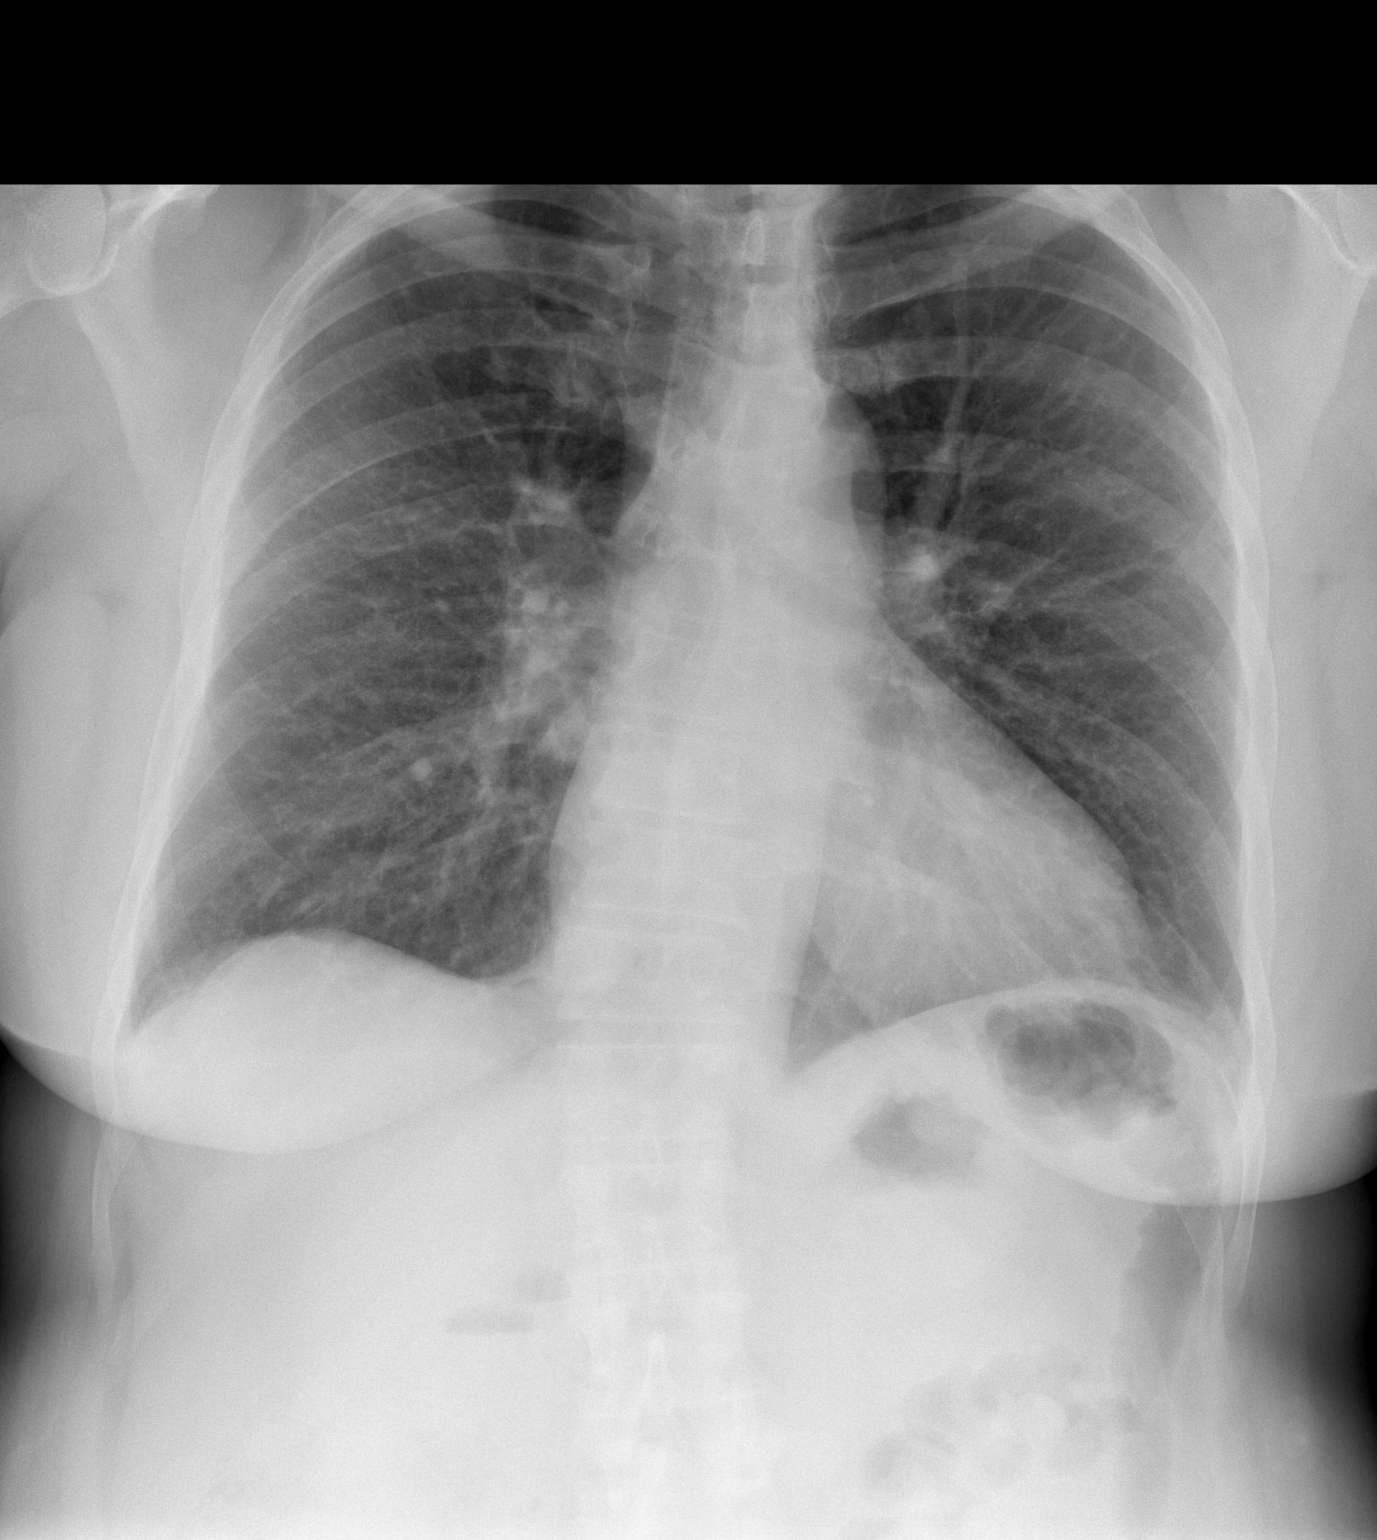

[w chest lat]
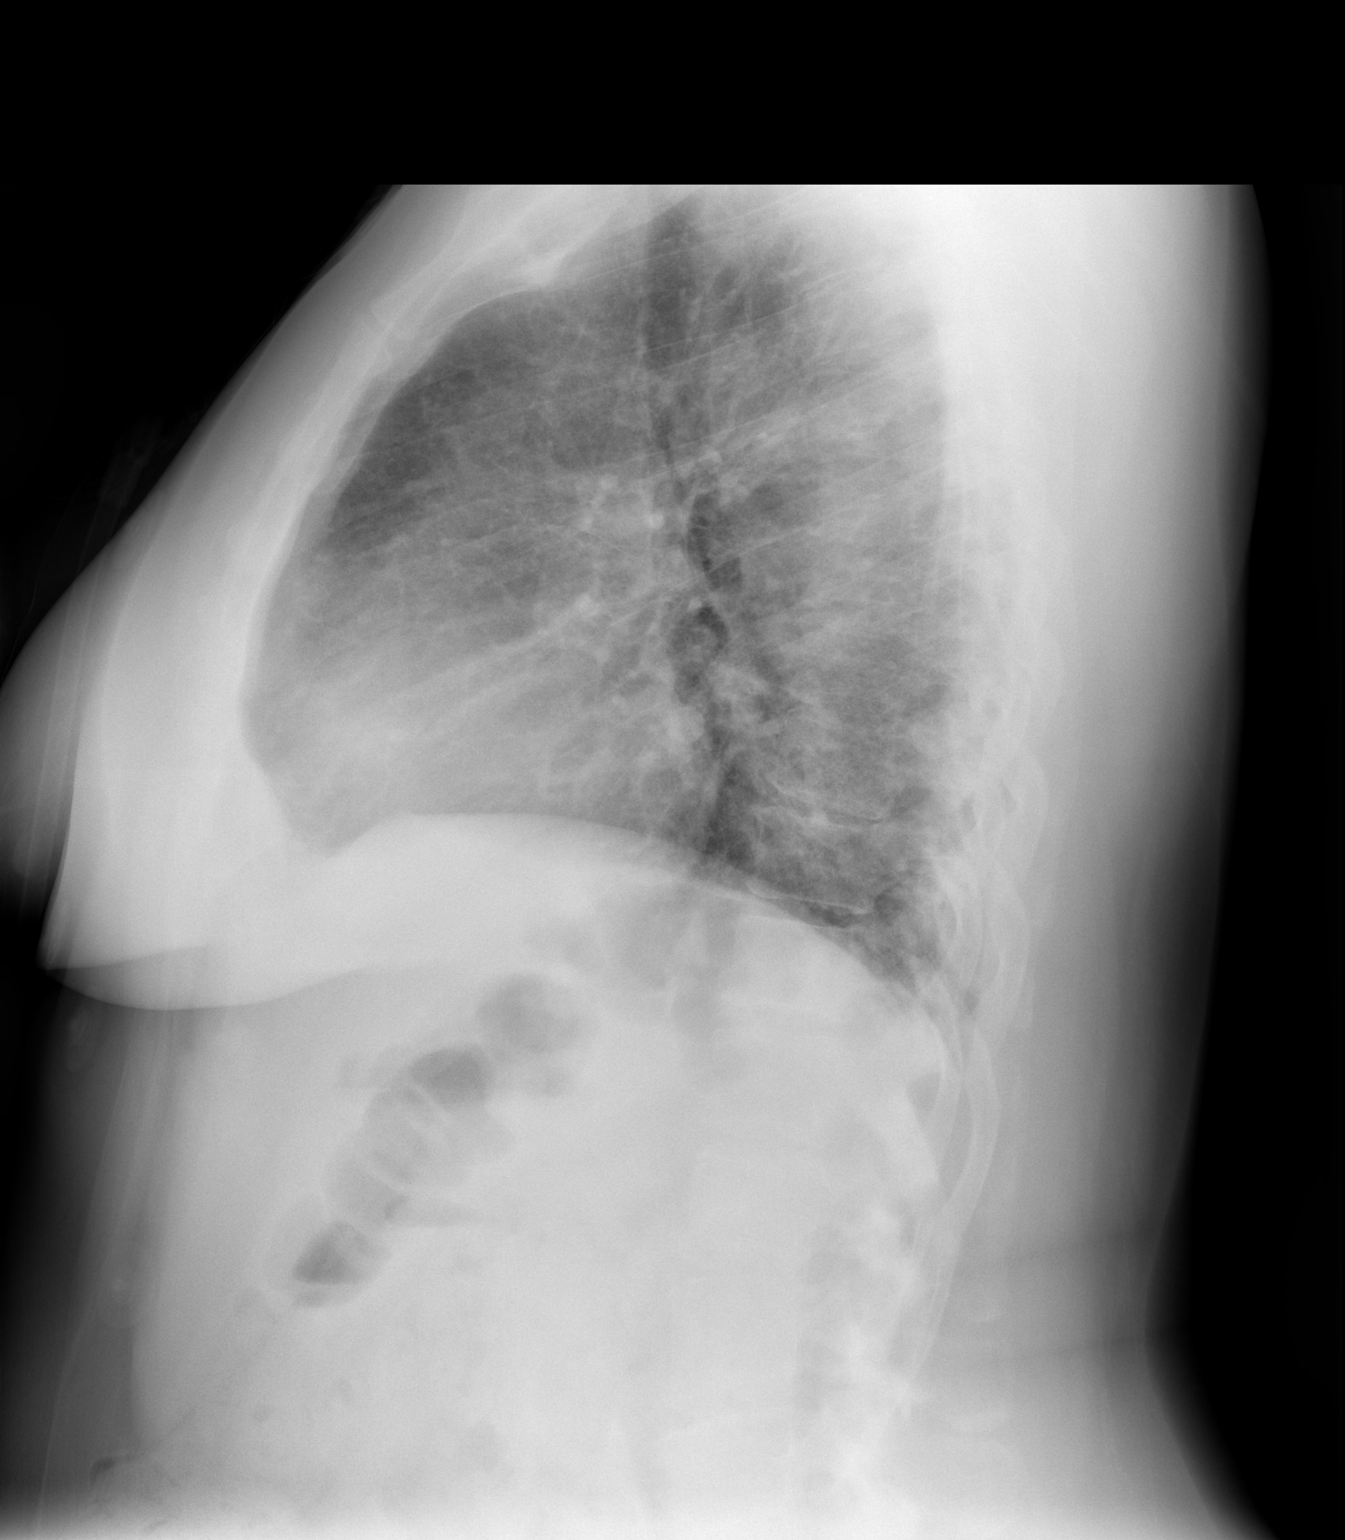

[2 of 2 positions shown; findings below may reference images not displayed]

FINDINGS: Cardiomediastinal silhouette is stable.  No acute
infiltrate or pleural effusion.  Stable chronic central mild
bronchitic changes.  Mild thoracic dextroscoliosis.
IMPRESSION: No active disease.  Stable chronic mild bronchitic changes.

## 2012-10-31 HISTORY — PX: HEMORRHOID BANDING: SHX5850

## 2013-02-26 ENCOUNTER — Other Ambulatory Visit: Payer: Self-pay

## 2013-02-26 ENCOUNTER — Emergency Department (HOSPITAL_COMMUNITY)
Admission: EM | Admit: 2013-02-26 | Discharge: 2013-02-26 | Disposition: A | Discharge status: Home / Self Care | Payer: BC Managed Care – PPO | Attending: Emergency Medicine | Admitting: Emergency Medicine

## 2013-02-26 ENCOUNTER — Encounter (HOSPITAL_COMMUNITY): Payer: Self-pay | Admitting: Emergency Medicine

## 2013-02-26 DIAGNOSIS — H538 Other visual disturbances: Secondary | ICD-10-CM | POA: Insufficient documentation

## 2013-02-26 DIAGNOSIS — Z91199 Patient's noncompliance with other medical treatment and regimen due to unspecified reason: Secondary | ICD-10-CM | POA: Insufficient documentation

## 2013-02-26 DIAGNOSIS — J4489 Other specified chronic obstructive pulmonary disease: Secondary | ICD-10-CM | POA: Insufficient documentation

## 2013-02-26 DIAGNOSIS — Z79899 Other long term (current) drug therapy: Secondary | ICD-10-CM | POA: Insufficient documentation

## 2013-02-26 DIAGNOSIS — R0789 Other chest pain: Secondary | ICD-10-CM | POA: Insufficient documentation

## 2013-02-26 DIAGNOSIS — E785 Hyperlipidemia, unspecified: Secondary | ICD-10-CM | POA: Insufficient documentation

## 2013-02-26 DIAGNOSIS — I10 Essential (primary) hypertension: Secondary | ICD-10-CM

## 2013-02-26 DIAGNOSIS — Z7982 Long term (current) use of aspirin: Secondary | ICD-10-CM | POA: Insufficient documentation

## 2013-02-26 DIAGNOSIS — Z9119 Patient's noncompliance with other medical treatment and regimen: Secondary | ICD-10-CM | POA: Insufficient documentation

## 2013-02-26 DIAGNOSIS — J449 Chronic obstructive pulmonary disease, unspecified: Secondary | ICD-10-CM | POA: Insufficient documentation

## 2013-02-26 DIAGNOSIS — F17200 Nicotine dependence, unspecified, uncomplicated: Secondary | ICD-10-CM | POA: Insufficient documentation

## 2013-02-26 LAB — BASIC METABOLIC PANEL
BUN: 15 mg/dL (ref 6–23)
CO2: 27 mEq/L (ref 19–32)
Calcium: 9.3 mg/dL (ref 8.4–10.5)
Creatinine, Ser: 1.11 mg/dL — ABNORMAL HIGH (ref 0.50–1.10)

## 2013-02-26 LAB — POCT I-STAT TROPONIN I: Troponin i, poc: 0 ng/mL (ref 0.00–0.08)

## 2013-02-26 MED ORDER — ASPIRIN EC 81 MG PO TBEC: 81.0000 mg | DELAYED_RELEASE_TABLET | Freq: Every day | ORAL | Status: DC

## 2013-02-26 MED ORDER — ALBUTEROL SULFATE HFA 108 (90 BASE) MCG/ACT IN AERS: 2.0000 | INHALATION_SPRAY | RESPIRATORY_TRACT | Status: DC | PRN

## 2013-02-26 MED ORDER — HYDROCHLOROTHIAZIDE 25 MG PO TABS
12.5000 mg | ORAL_TABLET | Freq: Once | ORAL | Status: AC
  Administered 2013-02-26: 12.5 mg via ORAL
  Filled 2013-02-26: qty 1

## 2013-02-26 MED ORDER — CYCLOBENZAPRINE HCL 10 MG PO TABS: 5.0000 mg | ORAL_TABLET | Freq: Two times a day (BID) | ORAL | Status: DC | PRN

## 2013-02-26 MED ORDER — LISINOPRIL 20 MG PO TABS
20.0000 mg | ORAL_TABLET | Freq: Once | ORAL | Status: AC
  Administered 2013-02-26: 20 mg via ORAL
  Filled 2013-02-26: qty 1

## 2013-02-26 MED ORDER — LISINOPRIL-HYDROCHLOROTHIAZIDE 20-12.5 MG PO TABS: 1.0000 | ORAL_TABLET | Freq: Every day | ORAL | Status: DC

## 2013-02-26 NOTE — ED Notes (Addendum)
Pt from work c/o htn with blurred vision, eye pain, and bilateral arm tingling. Denies cp and sob at this time

## 2013-02-26 NOTE — ED Provider Notes (Signed)
Patient developed failure blurred vision sweatiness tingling in all fingers onset approximately 8 AM today. Her blood pressure was taken at work which was 99991111 systolic. She is asymptomatic presently without treatment she admits to noncompliance with her medications for the past several days due to running out.   Orlie Dakin, MD 02/26/13 [phone]

## 2013-02-26 NOTE — ED Provider Notes (Signed)
History     CSN: [id number]  Arrival date & time 02/26/13  0807   First MD Initiated Contact with Patient 02/26/13 0845      Chief Complaint  Patient presents with  . Hypertension    (Consider location/radiation/quality/duration/timing/severity/associated sxs/prior treatment) HPI Comments: 24 y F with PMH of HTN, COPD, GERD and DVT here with c/o high blood pressure (190s/90s) that was a/w tingling in both hands, clamminess, bilateral chest tightness and blurry vision that occurred this morning while at work around [place].  Symptoms lasted approx 30 minutes and she feels back at her baseline currently.  Pt reports that her PCP's office closed down so she has been off of all of her medications, including her BP meds.  No CP, SOB, vision complaints, HA, abd pain, back pain or other complaints currently.  Patient is a 57 y.o. female presenting with hypertension. The history is provided by the patient.  Hypertension This is a recurrent problem. The current episode started today. The problem occurs constantly. The problem has been resolved. Associated symptoms include chest pain (chest tightness, both sides) and a visual change (blurry vision). Pertinent negatives include no abdominal pain, change in bowel habit, chills, congestion, coughing, fever, headaches, myalgias, nausea, neck pain, numbness, sore throat, urinary symptoms, vertigo, vomiting or weakness. Treatments tried: cold washcloth over eyes. The treatment provided moderate relief.    Past Medical History  Diagnosis Date  . Hypertension   . Hyperlipidemia   . COPD (chronic obstructive pulmonary disease)     Past Surgical History  Procedure Laterality Date  . Hemorrhoid surgery  66yrs agao  . Abdominal hysterectomy  20yrs ago    Family History  Problem Relation Age of Onset  . Cancer Father     prostate    History  Substance Use Topics  . Smoking status: Current Every Day Smoker -- 0.25 packs/day    Types: Cigarettes  .  Smokeless tobacco: Not on file  . Alcohol Use: 0.0 oz/week    1-2 Glasses of wine per week     Comment: occ    OB History   Grav Para Term Preterm Abortions TAB SAB Ect Mult Living                  Review of Systems  Constitutional: Negative for fever and chills.  HENT: Negative for congestion, sore throat and neck pain.   Respiratory: Negative for cough.   Cardiovascular: Positive for chest pain (chest tightness, both sides).  Gastrointestinal: Negative for nausea, vomiting, abdominal pain and change in bowel habit.  Musculoskeletal: Negative for myalgias.  Neurological: Negative for vertigo, weakness, numbness and headaches.  All other systems reviewed and are negative.    Allergies  Sulfa antibiotics; Fish allergy; and Penicillins  Home Medications   Current Outpatient Rx  Name  Route  Sig  Dispense  Refill  . lisinopril-hydrochlorothiazide (PRINZIDE,ZESTORETIC) 10-12.5 MG per tablet   Oral   Take by mouth daily.         . ranitidine (ZANTAC) 150 MG tablet   Oral   Take 150 mg by mouth 2 (two) times daily.         Marland Kitchen albuterol (PROVENTIL HFA;VENTOLIN HFA) 108 (90 BASE) MCG/ACT inhaler   Inhalation   Inhale 2 puffs into the lungs every 4 (four) hours as needed for wheezing.   1 Inhaler   1   . aspirin EC 81 MG tablet   Oral   Take 1 tablet (81 mg total) by mouth  daily.   30 tablet   0   . cyclobenzaprine (FLEXERIL) 10 MG tablet   Oral   Take 0.5 tablets (5 mg total) by mouth 2 (two) times daily as needed for muscle spasms.   30 tablet   0   . lisinopril-hydrochlorothiazide (PRINZIDE) 20-12.5 MG per tablet   Oral   Take 1 tablet by mouth daily.   30 tablet   0     BP 152/90  Temp(Src) 98.4 F (36.9 C)  Resp 20  Ht 5' 7.5" (1.715 m)  Wt 234 lb (106.142 kg)  BMI 36.09 kg/m2  SpO2 96%  Physical Exam  Vitals reviewed. Constitutional: She is oriented to person, place, and time. She appears well-developed and well-nourished.  HENT:  Right  Ear: External ear normal.  Left Ear: External ear normal.  Mouth/Throat: No oropharyngeal exudate.  Eyes: Conjunctivae and EOM are normal. Pupils are equal, round, and reactive to light.  Fundoscopic exam:      The right eye shows no papilledema.       The left eye shows no papilledema.  Neck: Normal range of motion. Neck supple.  Cardiovascular: Normal rate, regular rhythm, normal heart sounds and intact distal pulses.  Exam reveals no gallop and no friction rub.   No murmur heard. Pulmonary/Chest: Effort normal and breath sounds normal.  Abdominal: Soft. Bowel sounds are normal. She exhibits no distension. There is no tenderness.  Musculoskeletal: Normal range of motion. She exhibits no edema.  Neurological: She is alert and oriented to person, place, and time. No cranial nerve deficit.  Skin: Skin is warm and dry. No rash noted.  Psychiatric: She has a normal mood and affect.    ED Course  Procedures (including critical care time)  Labs Reviewed  BASIC METABOLIC PANEL - Abnormal; Notable for the following:    Creatinine, Ser 1.11 (*)    GFR calc non Af Amer 54 (*)    GFR calc Af Amer 63 (*)    All other components within normal limits  POCT I-STAT TROPONIN I  POCT I-STAT TROPONIN I   No results found.   Date: 02/26/2013  Rate: 71  Rhythm: normal sinus rhythm  QRS Axis: normal  Intervals: normal, borderline RAD  ST/T Wave abnormalities: TWI in V2  Conduction Disutrbances:none  Narrative Interpretation: NSR, normal intervals, likely Left atrial hypertrophy, poor R wave progression, no significant change from prior EKG 07/23/12  Old EKG Reviewed: unchanged    1. Hypertension       MDM   42 y F with PMH of HTN, COPD, GERD and DVT here with c/o high blood pressure (190s/90s) that was a/w tingling in both hands, clamminess, bilateral chest tightness and blurry vision that occurred this morning while at work around [place].  Symptoms lasted approx 30 minutes and she feels  back at her baseline currently.  Pt reports that her PCP's office closed down so she has been off of all of her medications, including her BP meds.  No CP, SOB, vision complaints, HA, abd pain, back pain or other complaints currently.  AFVSS, BP 150/90s, resting comfortably.  Lungs clear.  No edema.  No murmur.  No papilledema appreciated.  No signs of hypertensive crisis/urgency currently, but symptoms reported by pt somewhat concerning.  Will obtain BMP and plan for delta trop.  Will administer prior BP meds once prior medications are reconciled.  EKG unchanged from prior.  1:48 PM Pt has remained well appearing and asymptomatic while in the ED.  Delta trop negative.  BPs have remained within acceptable ranges.  She was given the first dose of Prinzide that she was previously taken.  Will give Rx's for Prinzide, ASA, Albuterol and Flexeril.  She was also given resources to obtain a PCP.  Return precautions reviewed.  It is felt the pt is stable for d/c.  All questions answered and patient expressed understanding. BP 149/75  Pulse 65  Temp(Src) 98.4 F (36.9 C)  Resp 21  Ht 5' 7.5" (1.715 m)  Wt 234 lb (106.142 kg)  BMI 36.09 kg/m2  SpO2 98%   Disposition: Discharge  Condition: Good      Follow-up Information   Follow up with Obtain a Primary Care Doctor as discussed.        Medication List    TAKE these medications       albuterol 108 (90 BASE) MCG/ACT inhaler  Commonly known as:  PROVENTIL HFA;VENTOLIN HFA  Inhale 2 puffs into the lungs every 4 (four) hours as needed for wheezing.     aspirin EC 81 MG tablet  Take 1 tablet (81 mg total) by mouth daily.     cyclobenzaprine 10 MG tablet  Commonly known as:  FLEXERIL  Take 0.5 tablets (5 mg total) by mouth 2 (two) times daily as needed for muscle spasms.     lisinopril-hydrochlorothiazide 20-12.5 MG per tablet  Commonly known as:  PRINZIDE  Take 1 tablet by mouth daily.      ASK your doctor about these medications        lisinopril-hydrochlorothiazide 10-12.5 MG per tablet  Commonly known as:  PRINZIDE,ZESTORETIC  Take by mouth daily.     ranitidine 150 MG tablet  Commonly known as:  ZANTAC  Take 150 mg by mouth 2 (two) times daily.         Pt seen in conjunction with my attending, Dr. Winfred Leeds.   Madaline Brilliant, MD PGY-II [facility] Emergency Medicine Resident    Madaline Brilliant, MD 02/26/13 [phone]

## 2013-02-28 NOTE — ED Provider Notes (Signed)
I have personally seen and examined the patient.  I have discussed the plan of care with the resident.  I have reviewed the documentation on PMH/FH/Soc. History.  I have reviewed the documentation of the resident and agree.  Orlie Dakin, MD 02/28/13 [phone]

## 2013-04-04 ENCOUNTER — Encounter (HOSPITAL_COMMUNITY): Payer: Self-pay | Admitting: Emergency Medicine

## 2013-04-04 ENCOUNTER — Emergency Department (INDEPENDENT_AMBULATORY_CARE_PROVIDER_SITE_OTHER)
Admission: EM | Admit: 2013-04-04 | Discharge: 2013-04-04 | Disposition: A | Discharge status: Home / Self Care | Payer: BC Managed Care – PPO | Source: Home / Self Care | Attending: Emergency Medicine | Admitting: Emergency Medicine

## 2013-04-04 DIAGNOSIS — J40 Bronchitis, not specified as acute or chronic: Secondary | ICD-10-CM

## 2013-04-04 MED ORDER — PREDNISONE 10 MG PO TABS: 20.0000 mg | ORAL_TABLET | Freq: Every day | ORAL | Status: DC

## 2013-04-04 MED ORDER — ALBUTEROL SULFATE HFA 108 (90 BASE) MCG/ACT IN AERS: 1.0000 | INHALATION_SPRAY | Freq: Four times a day (QID) | RESPIRATORY_TRACT | Status: DC | PRN

## 2013-04-04 MED ORDER — DOXYCYCLINE HYCLATE 100 MG PO CAPS: 100.0000 mg | ORAL_CAPSULE | Freq: Two times a day (BID) | ORAL | Status: AC

## 2013-04-04 NOTE — ED Provider Notes (Signed)
History     CSN: [id number]  Arrival date & time 04/04/13  1151   First MD Initiated Contact with Patient 04/04/13 1309      No chief complaint on file.   (Consider location/radiation/quality/duration/timing/severity/associated sxs/prior treatment) HPI Comments: Patient presents urgent care describing that since yesterday she's been very congested. With a sore throat, she has been coughing so much that she couldn't sleep well at night. Has been feeling tactile fevers, and feels somewhat tired. She describes she's able to expectorate phlegm with green and yellow in character to it. She denies any wheezing or shortness of breath no chest pain, no double pain or feeling nauseous.  Patient is a 57 y.o. female presenting with cough. The history is provided by the patient.  Cough Cough characteristics:  Productive Sputum characteristics:  Green Severity:  Mild Onset quality:  Sudden Timing:  Constant Progression:  Worsening Chronicity:  Recurrent Smoker: yes   Context: upper respiratory infection   Relieved by:  Nothing Associated symptoms: shortness of breath   Associated symptoms: no chest pain, no chills, no diaphoresis, no ear pain, no eye discharge, no fever, no myalgias, no rash, no rhinorrhea, no sinus congestion, no sore throat, no weight loss and no wheezing   Risk factors: no chemical exposure, no recent infection and no recent travel     Past Medical History  Diagnosis Date  . Hypertension   . Hyperlipidemia   . COPD (chronic obstructive pulmonary disease)     Past Surgical History  Procedure Laterality Date  . Hemorrhoid surgery  18yrs agao  . Abdominal hysterectomy  38yrs ago    Family History  Problem Relation Age of Onset  . Cancer Father     prostate    History  Substance Use Topics  . Smoking status: Current Every Day Smoker -- 0.25 packs/day    Types: Cigarettes  . Smokeless tobacco: Not on file  . Alcohol Use: 0.0 oz/week    1-2 Glasses of wine per  week     Comment: occ    OB History   Grav Para Term Preterm Abortions TAB SAB Ect Mult Living                  Review of Systems  Constitutional: Negative for fever, chills, weight loss, diaphoresis and activity change.  HENT: Negative for ear pain, sore throat and rhinorrhea.   Eyes: Negative for discharge.  Respiratory: Positive for cough and shortness of breath. Negative for wheezing.   Cardiovascular: Negative for chest pain.  Musculoskeletal: Negative for myalgias.  Skin: Negative for rash.    Allergies  Sulfa antibiotics; Fish allergy; and Penicillins  Home Medications   Current Outpatient Rx  Name  Route  Sig  Dispense  Refill  . albuterol (PROVENTIL HFA;VENTOLIN HFA) 108 (90 BASE) MCG/ACT inhaler   Inhalation   Inhale 2 puffs into the lungs every 4 (four) hours as needed for wheezing.   1 Inhaler   1   . aspirin EC 81 MG tablet   Oral   Take 1 tablet (81 mg total) by mouth daily.   30 tablet   0   . cyclobenzaprine (FLEXERIL) 10 MG tablet   Oral   Take 0.5 tablets (5 mg total) by mouth 2 (two) times daily as needed for muscle spasms.   30 tablet   0   . lisinopril-hydrochlorothiazide (PRINZIDE) 20-12.5 MG per tablet   Oral   Take 1 tablet by mouth daily.   30 tablet  0   . lisinopril-hydrochlorothiazide (PRINZIDE,ZESTORETIC) 10-12.5 MG per tablet   Oral   Take by mouth daily.         . ranitidine (ZANTAC) 150 MG tablet   Oral   Take 150 mg by mouth 2 (two) times daily.           BP 144/66  Pulse 84  Temp(Src) 100 F (37.8 C) (Oral)  Resp 16  SpO2 100%  Physical Exam  Constitutional: Vital signs are normal. She appears well-developed and well-nourished.  Non-toxic appearance. She does not have a sickly appearance. She does not appear ill. No distress.  HENT:  Head: Normocephalic.  Eyes: Conjunctivae are normal. No scleral icterus.  Neck: Neck supple.  Cardiovascular: Normal rate.  Exam reveals no gallop and no friction rub.   No  murmur heard. Pulmonary/Chest: Breath sounds normal. No respiratory distress. She has no decreased breath sounds. She has no wheezes. She has no rhonchi. She has no rales. She exhibits no tenderness.  Skin: No rash noted. No erythema.    ED Course  Procedures (including critical care time)  Labs Reviewed - No data to display No results found.   No diagnosis found.    MDM  Current symptomatology most consistent with bronchitis patient has COPD, will start patient on broad-spectrum antibiotic and a course of prednisone for 5 days.  Rx Doxycycline Rx Prednisone Rx Albuterol     Rosana Hoes, MD 04/04/13 1341

## 2013-04-04 NOTE — ED Notes (Signed)
Sore throat, cough and coughing up phlegm, and headache

## 2013-04-16 ENCOUNTER — Ambulatory Visit (INDEPENDENT_AMBULATORY_CARE_PROVIDER_SITE_OTHER): Payer: BC Managed Care – PPO | Admitting: Internal Medicine

## 2013-04-16 ENCOUNTER — Encounter: Payer: Self-pay | Admitting: Internal Medicine

## 2013-04-16 ENCOUNTER — Ambulatory Visit (INDEPENDENT_AMBULATORY_CARE_PROVIDER_SITE_OTHER): Payer: BC Managed Care – PPO

## 2013-04-16 VITALS — BP 120/78 | HR 68 | Temp 97.8°F | Ht 68.0 in | Wt 244.0 lb

## 2013-04-16 DIAGNOSIS — Z0000 Encounter for general adult medical examination without abnormal findings: Secondary | ICD-10-CM

## 2013-04-16 DIAGNOSIS — E785 Hyperlipidemia, unspecified: Secondary | ICD-10-CM

## 2013-04-16 DIAGNOSIS — Z131 Encounter for screening for diabetes mellitus: Secondary | ICD-10-CM

## 2013-04-16 DIAGNOSIS — Z130 Encounter for screening for diseases of the blood and blood-forming organs and certain disorders involving the immune mechanism: Secondary | ICD-10-CM

## 2013-04-16 LAB — COMPREHENSIVE METABOLIC PANEL
ALT: 20 U/L (ref 0–35)
Albumin: 3.5 g/dL (ref 3.5–5.2)
CO2: 27 mEq/L (ref 19–32)
Calcium: 10.1 mg/dL (ref 8.4–10.5)
Chloride: 105 mEq/L (ref 96–112)
GFR: 51.93 mL/min — ABNORMAL LOW (ref >60.00)
Glucose, Bld: 84 mg/dL (ref 70–99)
Potassium: 4 mEq/L (ref 3.5–5.1)
Sodium: 141 mEq/L (ref 135–145)
Total Bilirubin: 0.4 mg/dL (ref 0.3–1.2)
Total Protein: 7.5 g/dL (ref 6.0–8.3)

## 2013-04-16 LAB — LIPID PANEL
Cholesterol: 254 mg/dL — ABNORMAL HIGH (ref 0–200)
HDL: 49.3 mg/dL (ref >39.00)
Triglycerides: 128 mg/dL (ref 0.0–149.0)
VLDL: 25.6 mg/dL (ref 0.0–40.0)

## 2013-04-16 LAB — CBC
MCHC: 33.5 g/dL (ref 30.0–36.0)
RDW: 16.6 % — ABNORMAL HIGH (ref 11.5–14.6)
WBC: 9.2 10*3/uL (ref 4.5–10.5)

## 2013-04-16 NOTE — Patient Instructions (Signed)

## 2013-04-16 NOTE — Progress Notes (Signed)
HPI  Pt presents to the clinic today to establish care. She was being seen by [facility] prior to them closing. She has no concerns today.  Flu: 2010 Tetanus: 2010 LMP: post menopausal Pap smear: hysterectomy Mammogram: 2012 Eye doctor: as needed Dentist: as needed Colonoscopy: never  Past Medical History  Diagnosis Date  . Hypertension   . Hyperlipidemia   . COPD (chronic obstructive pulmonary disease)   . Osteoarthritis   . Depression   . GERD (gastroesophageal reflux disease)   . History of chicken pox   . Blood in stool     Current Outpatient Prescriptions  Medication Sig Dispense Refill  . albuterol (PROVENTIL HFA;VENTOLIN HFA) 108 (90 BASE) MCG/ACT inhaler Inhale 2 puffs into the lungs every 4 (four) hours as needed for wheezing.  1 Inhaler  1  . aspirin EC 81 MG tablet Take 1 tablet (81 mg total) by mouth daily.  30 tablet  0  . cyclobenzaprine (FLEXERIL) 10 MG tablet Take 0.5 tablets (5 mg total) by mouth 2 (two) times daily as needed for muscle spasms.  30 tablet  0  . doxycycline (DORYX) 100 MG DR capsule Take 100 mg by mouth 2 (two) times daily.      Marland Kitchen lisinopril-hydrochlorothiazide (PRINZIDE) 20-12.5 MG per tablet Take 1 tablet by mouth daily.  30 tablet  0  . predniSONE (DELTASONE) 10 MG tablet Take 2 tablets (20 mg total) by mouth daily.  15 tablet  0  . ranitidine (ZANTAC) 150 MG tablet Take 150 mg by mouth 2 (two) times daily.       No current facility-administered medications for this visit.    Allergies  Allergen Reactions  . Sulfa Antibiotics Anaphylaxis and Hives  . Fish Allergy Other (See Comments)    Severe stomach cramps   . Penicillins Other (See Comments)    Welts     Family History  Problem Relation Age of Onset  . Cancer Father     prostate  . Hypertension Mother   . Diabetes Mother   . Hypertension Sister   . Heart disease Maternal Uncle   . Stroke Maternal Grandmother   . Hypertension Maternal Grandmother   . Hypertension Sister      History   Social History  . Marital Status: Married    Spouse Name: N/A    Number of Children: N/A  . Years of Education: 12+   Occupational History  . PCA    Social History Main Topics  . Smoking status: Current Every Day Smoker -- 0.25 packs/day    Types: Cigarettes  . Smokeless tobacco: Never Used  . Alcohol Use: 0.0 oz/week    1-2 Glasses of wine per week     Comment: occ  . Drug Use: No  . Sexually Active: Yes   Other Topics Concern  . Not on file   Social History Narrative   Regular exercise-no   Caffeine Use-    ROS:  Constitutional: Denies fever, malaise, fatigue, headache or abrupt weight changes.  HEENT: Denies eye pain, eye redness, ear pain, ringing in the ears, wax buildup, runny nose, nasal congestion, bloody nose, or sore throat. Respiratory: Denies difficulty breathing, shortness of breath, cough or sputum production.   Cardiovascular: Denies chest pain, chest tightness, palpitations or swelling in the hands or feet.  Gastrointestinal: Pt reports hemorrhoids. Denies abdominal pain, bloating, constipation, diarrhea.  GU: Denies frequency, urgency, pain with urination, blood in urine, odor or discharge. Musculoskeletal: Denies decrease in range of motion,  difficulty with gait, muscle pain or joint pain and swelling.  Skin: Denies redness, rashes, lesions or ulcercations.  Neurological: Denies dizziness, difficulty with memory, difficulty with speech or problems with balance and coordination.   No other specific complaints in a complete review of systems (except as listed in HPI above).  PE:  BP 120/78  Pulse 68  Temp(Src) 97.8 F (36.6 C) (Oral)  Ht 5\' 8"  (1.727 m)  Wt 244 lb (110.678 kg)  BMI 37.11 kg/m2  SpO2 97% Wt Readings from Last 3 Encounters:  04/16/13 244 lb (110.678 kg)  02/26/13 234 lb (106.142 kg)  01/16/12 255 lb 9.6 oz (115.939 kg)    General: Appears her stated age, well developed, well nourished in NAD. HEENT: Head:  normal shape and size; Eyes: sclera white, no icterus, conjunctiva pink, PERRLA and EOMs intact; Ears: Tm's gray and intact, normal light reflex; Nose: mucosa pink and moist, septum midline; Throat/Mouth: Teeth present, mucosa pink and moist, no lesions or ulcerations noted.  Neck: Normal range of motion. Neck supple, trachea midline. No massses, lumps or thyromegaly present.  Cardiovascular: Normal rate and rhythm. S1,S2 noted.  No murmur, rubs or gallops noted. No JVD or BLE edema. No carotid bruits noted. Pulmonary/Chest: Normal effort and positive vesicular breath sounds. No respiratory distress. No wheezes, rales or ronchi noted.  Abdomen: Soft and nontender. Normal bowel sounds, no bruits noted. No distention or masses noted. Liver, spleen and kidneys non palpable. Musculoskeletal: Normal range of motion. No signs of joint swelling. No difficulty with gait.  Neurological: Alert and oriented. Cranial nerves II-XII intact. Coordination normal. +DTRs bilaterally. Psychiatric: Mood and affect normal. Behavior is normal. Judgment and thought content normal.    BMET    Component Value Date/Time   NA 139 02/26/2013 0910   K 4.1 02/26/2013 0910   CL 105 02/26/2013 0910   CO2 27 02/26/2013 0910   GLUCOSE 94 02/26/2013 0910   BUN 15 02/26/2013 0910   CREATININE 1.11* 02/26/2013 0910   CALCIUM 9.3 02/26/2013 0910   GFRNONAA 54* 02/26/2013 0910   GFRAA 63* 02/26/2013 0910    Lipid Panel     Component Value Date/Time   CHOL 211* 04/11/2011 0515   TRIG 133 04/11/2011 0515   HDL 40 04/11/2011 0515   CHOLHDL 5.3 04/11/2011 0515   VLDL 27 04/11/2011 0515   LDLCALC 144* 04/11/2011 0515    CBC    Component Value Date/Time   WBC 5.7 07/23/2012 1621   RBC 4.30 07/23/2012 1621   HGB 13.3 07/23/2012 1621   HCT 38.6 07/23/2012 1621   PLT 214 07/23/2012 1621   MCV 89.8 07/23/2012 1621   MCH 30.9 07/23/2012 1621   MCHC 34.5 07/23/2012 1621   RDW 16.5* 07/23/2012 1621   LYMPHSABS 2.5 07/23/2012 1621   MONOABS 0.3  07/23/2012 1621   EOSABS 0.2 07/23/2012 1621   BASOSABS 0.0 07/23/2012 1621    Hgb A1C Lab Results  Component Value Date   HGBA1C 5.8* 04/10/2011     Assessment and Plan:  Preventative Health Maintenance:  Encouraged pt to work on diet and exercise Encouraged pt to visit eye doctor and dentist yearly Will set up mammogram and colonoscopy Will obtain labs today

## 2013-04-17 ENCOUNTER — Encounter: Payer: Self-pay | Admitting: Internal Medicine

## 2013-04-17 LAB — LDL CHOLESTEROL, DIRECT: Direct LDL: 190.1 mg/dL

## 2013-04-19 ENCOUNTER — Telehealth: Payer: Self-pay

## 2013-04-19 NOTE — Telephone Encounter (Signed)
Called [facility] advising to check mychart message for the following:     Notes Recorded by Robin B Ewing on 04/18/2013 at 4:59 PM Called left message to call back Notes Recorded by Webb Silversmith, NP on 04/17/2013 at 12:11 PM Please call pt and let her know that her kidney function is decreasing. This could be due to her HCTZ. I want to take her off of it and keep her on the lisinopril. I need to see her back in one month for blood pressure check. Also cholesterol and LDL are very elevated. I would like to put her on a low dose cholesterol medication called simvistatin. Is this ok if I call it into her pharmacy as well?

## 2013-04-22 ENCOUNTER — Other Ambulatory Visit: Payer: Self-pay | Admitting: *Deleted

## 2013-04-22 ENCOUNTER — Other Ambulatory Visit: Payer: Self-pay | Admitting: Internal Medicine

## 2013-04-22 ENCOUNTER — Telehealth: Payer: Self-pay | Admitting: Internal Medicine

## 2013-04-22 MED ORDER — LISINOPRIL 20 MG PO TABS: 20.0000 mg | ORAL_TABLET | Freq: Every day | ORAL | Status: DC

## 2013-04-22 MED ORDER — HYDROCODONE-HOMATROPINE 5-1.5 MG/5ML PO SYRP: 5.0000 mL | ORAL_SOLUTION | Freq: Three times a day (TID) | ORAL | Status: DC | PRN

## 2013-04-22 MED ORDER — RANITIDINE HCL 150 MG PO TABS: 150.0000 mg | ORAL_TABLET | Freq: Two times a day (BID) | ORAL | Status: DC

## 2013-04-22 MED ORDER — SIMVASTATIN 10 MG PO TABS: 10.0000 mg | ORAL_TABLET | Freq: Every day | ORAL | Status: DC

## 2013-04-22 MED ORDER — CYCLOBENZAPRINE HCL 10 MG PO TABS: 5.0000 mg | ORAL_TABLET | Freq: Two times a day (BID) | ORAL | Status: DC | PRN

## 2013-04-22 NOTE — Telephone Encounter (Signed)
Pt informed of lab results via result note.

## 2013-04-22 NOTE — Telephone Encounter (Signed)
Patient calling to request lab results

## 2013-04-30 ENCOUNTER — Ambulatory Visit (HOSPITAL_COMMUNITY)
Admission: RE | Admit: 2013-04-30 | Discharge: 2013-04-30 | Disposition: A | Discharge status: Home / Self Care | Payer: BC Managed Care – PPO | Source: Ambulatory Visit | Attending: Internal Medicine | Admitting: Internal Medicine

## 2013-04-30 DIAGNOSIS — Z0000 Encounter for general adult medical examination without abnormal findings: Secondary | ICD-10-CM

## 2013-04-30 DIAGNOSIS — Z1231 Encounter for screening mammogram for malignant neoplasm of breast: Secondary | ICD-10-CM

## 2013-05-20 ENCOUNTER — Ambulatory Visit (INDEPENDENT_AMBULATORY_CARE_PROVIDER_SITE_OTHER): Payer: BC Managed Care – PPO | Admitting: Internal Medicine

## 2013-05-20 ENCOUNTER — Other Ambulatory Visit (INDEPENDENT_AMBULATORY_CARE_PROVIDER_SITE_OTHER): Payer: BC Managed Care – PPO

## 2013-05-20 ENCOUNTER — Encounter: Payer: Self-pay | Admitting: Internal Medicine

## 2013-05-20 VITALS — BP 138/78 | HR 83 | Temp 98.2°F | Wt 251.0 lb

## 2013-05-20 DIAGNOSIS — R7989 Other specified abnormal findings of blood chemistry: Secondary | ICD-10-CM

## 2013-05-20 DIAGNOSIS — I10 Essential (primary) hypertension: Secondary | ICD-10-CM

## 2013-05-20 DIAGNOSIS — I152 Hypertension secondary to endocrine disorders: Secondary | ICD-10-CM | POA: Insufficient documentation

## 2013-05-20 DIAGNOSIS — R799 Abnormal finding of blood chemistry, unspecified: Secondary | ICD-10-CM

## 2013-05-20 DIAGNOSIS — IMO0002 Reserved for concepts with insufficient information to code with codable children: Secondary | ICD-10-CM

## 2013-05-20 LAB — BASIC METABOLIC PANEL
BUN: 12 mg/dL (ref 6–23)
CO2: 27 mEq/L (ref 19–32)
Calcium: 9.1 mg/dL (ref 8.4–10.5)
Creatinine, Ser: 1.1 mg/dL (ref 0.4–1.2)
Glucose, Bld: 111 mg/dL — ABNORMAL HIGH (ref 70–99)

## 2013-05-20 NOTE — Assessment & Plan Note (Signed)
Continue lisinopril 20 mg daily Will recheck BMET today

## 2013-05-20 NOTE — Progress Notes (Signed)
Subjective:    Patient ID: Krystal Allen, female    DOB: Aug 12, 1956, 57 y.o.   MRN: [id number]  HPI  Pt presents to the clinic today for 1 month f/u HTN. At her last visit, it was noted that her creatinine was slightly increased. She had been on lisinopril-hctz. We decided to take out the hctz and in 1 month, recheck kidney function and monitor blood pressure. Her blood pressure today is 138/78.She has been tolerating the medication well without side effects.  Review of Systems      Past Medical History  Diagnosis Date  . Hypertension   . Hyperlipidemia   . COPD (chronic obstructive pulmonary disease)   . Osteoarthritis   . Depression   . GERD (gastroesophageal reflux disease)   . History of chicken pox   . Blood in stool     Current Outpatient Prescriptions  Medication Sig Dispense Refill  . albuterol (PROVENTIL HFA;VENTOLIN HFA) 108 (90 BASE) MCG/ACT inhaler Inhale 2 puffs into the lungs every 4 (four) hours as needed for wheezing.  1 Inhaler  1  . aspirin EC 81 MG tablet Take 1 tablet (81 mg total) by mouth daily.  30 tablet  0  . cyclobenzaprine (FLEXERIL) 10 MG tablet Take 0.5 tablets (5 mg total) by mouth 2 (two) times daily as needed for muscle spasms.  30 tablet  5  . doxycycline (DORYX) 100 MG DR capsule Take 100 mg by mouth 2 (two) times daily.      Marland Kitchen lisinopril (PRINIVIL,ZESTRIL) 20 MG tablet Take 1 tablet (20 mg total) by mouth daily.  30 tablet  0  . ranitidine (ZANTAC) 150 MG tablet Take 1 tablet (150 mg total) by mouth 2 (two) times daily.  60 tablet  5  . simvastatin (ZOCOR) 10 MG tablet Take 1 tablet (10 mg total) by mouth at bedtime.  90 tablet  3  . HYDROcodone-homatropine (HYCODAN) 5-1.5 MG/5ML syrup Take 5 mLs by mouth every 8 (eight) hours as needed for cough.  120 mL  0  . predniSONE (DELTASONE) 10 MG tablet Take 2 tablets (20 mg total) by mouth daily.  15 tablet  0   No current facility-administered medications for this visit.    Allergies  Allergen  Reactions  . Sulfa Antibiotics Anaphylaxis and Hives  . Fish Allergy Other (See Comments)    Severe stomach cramps   . Penicillins Other (See Comments)    Welts     Family History  Problem Relation Age of Onset  . Cancer Father     prostate  . Hypertension Mother   . Diabetes Mother   . Hypertension Sister   . Heart disease Maternal Uncle   . Stroke Maternal Grandmother   . Hypertension Maternal Grandmother   . Hypertension Sister     History   Social History  . Marital Status: Married    Spouse Name: N/A    Number of Children: N/A  . Years of Education: 12+   Occupational History  . PCA    Social History Main Topics  . Smoking status: Current Every Day Smoker -- 0.25 packs/day    Types: Cigarettes  . Smokeless tobacco: Never Used  . Alcohol Use: 0.0 oz/week    1-2 Glasses of wine per week     Comment: occ  . Drug Use: No  . Sexually Active: Yes   Other Topics Concern  . Not on file   Social History Narrative   Regular exercise-no   Caffeine Use-  Constitutional: Denies fever, malaise, fatigue, headache or abrupt weight changes.  Respiratory: Denies difficulty breathing, shortness of breath, cough or sputum production.   Cardiovascular: Denies chest pain, chest tightness, palpitations or swelling in the hands or feet.  Neurological: Denies dizziness, difficulty with memory, difficulty with speech or problems with balance and coordination.   No other specific complaints in a complete review of systems (except as listed in HPI above).  Objective:   Physical Exam   BP 138/78  Pulse 83  Temp(Src) 98.2 F (36.8 C) (Oral)  Wt 251 lb (113.853 kg)  BMI 38.17 kg/m2  SpO2 96% Wt Readings from Last 3 Encounters:  05/20/13 251 lb (113.853 kg)  04/16/13 244 lb (110.678 kg)  02/26/13 234 lb (106.142 kg)    General: Appears  her stated age, well developed, well nourished in NAD. Cardiovascular: Normal rate and rhythm. S1,S2 noted.  No murmur, rubs or  gallops noted. No JVD or BLE edema. No carotid bruits noted. Pulmonary/Chest: Normal effort and positive vesicular breath sounds. No respiratory distress. No wheezes, rales or ronchi noted.  Neurological: Alert and oriented. Cranial nerves II-XII intact. Coordination normal. +DTRs bilaterally.   BMET    Component Value Date/Time   NA 141 04/16/2013 1531   K 4.0 04/16/2013 1531   CL 105 04/16/2013 1531   CO2 27 04/16/2013 1531   GLUCOSE 84 04/16/2013 1531   BUN 16 04/16/2013 1531   CREATININE 1.4* 04/16/2013 1531   CALCIUM 10.1 04/16/2013 1531   GFRNONAA 54* 02/26/2013 0910   GFRAA 63* 02/26/2013 0910    Lipid Panel     Component Value Date/Time   CHOL 254* 04/16/2013 1531   TRIG 128.0 04/16/2013 1531   HDL 49.30 04/16/2013 1531   CHOLHDL 5 04/16/2013 1531   VLDL 25.6 04/16/2013 1531   LDLCALC 144* 04/11/2011 0515    CBC    Component Value Date/Time   WBC 9.2 04/16/2013 1531   RBC 4.86 04/16/2013 1531   HGB 15.0 04/16/2013 1531   HCT 44.7 04/16/2013 1531   PLT 275.0 04/16/2013 1531   MCV 92.0 04/16/2013 1531   MCH 30.9 07/23/2012 1621   MCHC 33.5 04/16/2013 1531   RDW 16.6* 04/16/2013 1531   LYMPHSABS 2.5 07/23/2012 1621   MONOABS 0.3 07/23/2012 1621   EOSABS 0.2 07/23/2012 1621   BASOSABS 0.0 07/23/2012 1621    Hgb A1C Lab Results  Component Value Date   HGBA1C 6.3 04/16/2013        Assessment & Plan:

## 2013-05-20 NOTE — Patient Instructions (Addendum)
Calorie Counting Diet A calorie counting diet requires you to eat the number of calories that are right for you in a day. Calories are the measurement of how much energy you get from the food you eat. Eating the right amount of calories is important for staying at a healthy weight. If you eat too many calories, your body will store them as fat and you may gain weight. If you eat too few calories, you may lose weight. Counting the number of calories you eat during a day will help you know if you are eating the right amount. A Registered Dietitian can determine how many calories you need in a day. The amount of calories needed varies from person to person. If your goal is to lose weight, you will need to eat fewer calories. Losing weight can benefit you if you are overweight or have health problems such as heart disease, high blood pressure, or diabetes. If your goal is to gain weight, you will need to eat more calories. Gaining weight may be necessary if you have a certain health problem that causes your body to need more energy. TIPS Whether you are increasing or decreasing the number of calories you eat during a day, it may be hard to get used to changes in what you eat and drink. The following are tips to help you keep track of the number of calories you eat.  Measure foods at home with measuring cups. This helps you know the amount of food and number of calories you are eating.  Restaurants often serve food in amounts that are larger than 1 serving. While eating out, estimate how many servings of a food you are given. For example, a serving of cooked rice is  cup or about the size of half of a fist. Knowing serving sizes will help you be aware of how much food you are eating at restaurants.  Ask for smaller portion sizes or child-size portions at restaurants.  Plan to eat half of a meal at a restaurant. Take the rest home or share the other half with a friend.  Read the Nutrition Facts panel on  food labels for calorie content and serving size. You can find out how many servings are in a package, the size of a serving, and the number of calories each serving has.  For example, a package might contain 3 cookies. The Nutrition Facts panel on that package says that 1 serving is 1 cookie. Below that, it will say there are 3 servings in the container. The calories section of the Nutrition Facts label says there are 90 calories. This means there are 90 calories in 1 cookie (1 serving). If you eat 1 cookie you have eaten 90 calories. If you eat all 3 cookies, you have eaten 270 calories (3 servings x 90 calories = 270 calories). The list below tells you how big or small some common portion sizes are.  1 oz.........4 stacked dice.  3 oz.........Deck of cards.  1 tsp........Tip of little finger.  1 tbs........Thumb.  2 tbs........Golf ball.   cup.......Half of a fist.  1 cup........A fist. KEEP A FOOD LOG Write down every food item you eat, the amount you eat, and the number of calories in each food you eat during the day. At the end of the day, you can add up the total number of calories you have eaten. It may help to keep a list like the one below. Find out the calorie information by reading the   Nutrition Facts panel on food labels. Breakfast  Bran cereal (1 cup, 110 calories).  Fat-free milk ( cup, 45 calories). Snack  Apple (1 medium, 80 calories). Lunch  Spinach (1 cup, 20 calories).  Tomato ( medium, 20 calories).  Chicken breast strips (3 oz, 165 calories).  Shredded cheddar cheese ( cup, 110 calories).  Light Italian dressing (2 tbs, 60 calories).  Whole-wheat bread (1 slice, 80 calories).  Tub margarine (1 tsp, 35 calories).  Vegetable soup (1 cup, 160 calories). Dinner  Pork chop (3 oz, 190 calories).  Brown rice (1 cup, 215 calories).  Steamed broccoli ( cup, 20 calories).  Strawberries (1  cup, 65 calories).  Whipped cream (1 tbs, 50  calories). Daily Calorie Total: 1425 Document Released: 10/17/2005 Document Revised: 01/09/2012 Document Reviewed: 04/13/2007 [employer] Patient Information 2014 [employer].  

## 2013-05-24 ENCOUNTER — Telehealth: Payer: Self-pay | Admitting: *Deleted

## 2013-05-24 NOTE — Telephone Encounter (Signed)
If this is a new problem , she needs OV. This was not discussed at her prior OV. We do have Saturday clinic

## 2013-05-24 NOTE — Telephone Encounter (Signed)
Pt called states she has thrush on her tongue and soreness.  She further states she is still coughing a lot.  Please advise

## 2013-05-24 NOTE — Telephone Encounter (Signed)
Spoke with pt advised on message.  Appointment scheduled for 7.28.14

## 2013-05-29 ENCOUNTER — Ambulatory Visit: Payer: BC Managed Care – PPO | Admitting: Internal Medicine

## 2013-06-03 ENCOUNTER — Other Ambulatory Visit: Payer: Self-pay | Admitting: *Deleted

## 2013-06-03 ENCOUNTER — Other Ambulatory Visit: Payer: Self-pay | Admitting: Internal Medicine

## 2013-06-03 MED ORDER — LISINOPRIL 20 MG PO TABS: 20.0000 mg | ORAL_TABLET | Freq: Every day | ORAL | Status: DC

## 2013-06-05 ENCOUNTER — Ambulatory Visit (AMBULATORY_SURGERY_CENTER): Payer: BC Managed Care – PPO

## 2013-06-05 VITALS — Ht 67.5 in | Wt 248.4 lb

## 2013-06-05 DIAGNOSIS — Z1211 Encounter for screening for malignant neoplasm of colon: Secondary | ICD-10-CM

## 2013-06-05 MED ORDER — SUPREP BOWEL PREP KIT 17.5-3.13-1.6 GM/177ML PO SOLN: 1.0000 | Freq: Once | ORAL | Status: DC

## 2013-06-06 ENCOUNTER — Encounter: Payer: Self-pay | Admitting: Internal Medicine

## 2013-06-19 ENCOUNTER — Encounter: Payer: Self-pay | Admitting: Internal Medicine

## 2013-06-19 ENCOUNTER — Ambulatory Visit (AMBULATORY_SURGERY_CENTER): Payer: BC Managed Care – PPO | Admitting: Internal Medicine

## 2013-06-19 VITALS — BP 123/75 | HR 64 | Temp 96.3°F | Resp 20 | Ht 67.0 in | Wt 248.0 lb

## 2013-06-19 DIAGNOSIS — D128 Benign neoplasm of rectum: Secondary | ICD-10-CM

## 2013-06-19 DIAGNOSIS — Z1211 Encounter for screening for malignant neoplasm of colon: Secondary | ICD-10-CM

## 2013-06-19 DIAGNOSIS — K648 Other hemorrhoids: Secondary | ICD-10-CM

## 2013-06-19 DIAGNOSIS — D126 Benign neoplasm of colon, unspecified: Secondary | ICD-10-CM

## 2013-06-19 HISTORY — DX: Other hemorrhoids: K64.8

## 2013-06-19 MED ORDER — SODIUM CHLORIDE 0.9 % IV SOLN: 500.0000 mL | INTRAVENOUS | Status: DC

## 2013-06-19 NOTE — Op Note (Signed)
[facility]  [address]. [place], [id number]   COLONOSCOPY PROCEDURE REPORT  PATIENT: Krystal Allen, Krystal Allen  MR#: [id number] BIRTHDATE: February 17, 1956 , 57  yrs. old GENDER: Female ENDOSCOPIST: Gatha Mayer, MD, [facility] PROCEDURE DATE:  06/19/2013 PROCEDURE:   Colonoscopy with biopsy and snare polypectomy First Screening Colonoscopy - Avg.  risk and is 50 yrs.  old or older Yes.  Prior Negative Screening - Now for repeat screening. N/A  History of Adenoma - Now for follow-up colonoscopy & has been > or = to 3 yrs.  N/A  Polyps Removed Today? Yes. ASA CLASS:   Class III INDICATIONS:average risk screening and first colonoscopy. MEDICATIONS: Propofol (Diprivan) 220 mg IV, MAC sedation, administered by CRNA, and These medications were titrated to patient response per physician's verbal order  DESCRIPTION OF PROCEDURE:   After the risks benefits and alternatives of the procedure were thoroughly explained, informed consent was obtained.  A digital rectal exam revealed hemorrhoids and A digital rectal exam revealed no rectal mass.   The [id number]  endoscope was introduced through the anus and advanced to the cecum, which was identified by both the appendix and ileocecal valve. No adverse events experienced.   The quality of the prep was excellent using Suprep  The instrument was then slowly withdrawn as the colon was fully examined.    COLON FINDINGS: Six sessile polyps measuring 2-6 mm in size were found in the ascending colon, transverse colon, and rectum.  A polypectomy was performed with a cold snare (3) and with cold forceps (3).  The resection was complete and the polyp tissue was completely retrieved.   Moderate sized internal hemorrhoids were found.   The colon mucosa was otherwise normal.  Retroflexed views revealed internal hemorrhoids. The time to cecum=3 minutes 44 seconds.  Withdrawal time=14 minutes 32 seconds.  The scope was withdrawn and the  procedure completed. COMPLICATIONS: There were no complications.  ENDOSCOPIC IMPRESSION: 1.   Six sessile polyps measuring 2-6 mm in size were found in the ascending colon, transverse colon, and rectum; polypectomy was performed with a cold snare and with cold forceps 2.   Moderate sized internal hemorrhoids 3.   The colon mucosa was otherwise normal - excellent prep  RECOMMENDATIONS: Timing of repeat colonoscopy will be determined by pathology findings.   eSigned:  Gatha Mayer, MD, [facility] 06/19/2013 10:59 AM  cc: The Patient  and Webb Silversmith, NP

## 2013-06-19 NOTE — Progress Notes (Signed)
Procedure ends, to recovery, report given and VSS. 

## 2013-06-19 NOTE — Progress Notes (Signed)
Patient did not experience any of the following events: a burn prior to discharge; a fall within the facility; wrong site/side/patient/procedure/implant event; or a hospital transfer or hospital admission upon discharge from the facility. (G8907) Patient did not have preoperative order for IV antibiotic SSI prophylaxis. (G8918)  

## 2013-06-19 NOTE — Progress Notes (Signed)
Called to room to assist during endoscopic procedure.  Patient ID and intended procedure confirmed with present staff. Received instructions for my participation in the procedure from the performing physician.  

## 2013-06-19 NOTE — Patient Instructions (Addendum)
I found and removed 6 small polyps today - they all look benign. Do not worry about these.  As you know - you also have hemorrhoids. I think I can help you with those. Please read the handout I gave you and we can schedule an appointment to start treatment.  I will let you know pathology results and when to have another routine colonoscopy by mail.  I appreciate the opportunity to care for you.  Gatha Mayer, MD, FACG   YOU HAD AN ENDOSCOPIC PROCEDURE TODAY AT [facility]: Refer to the procedure report that was given to you for any specific questions about what was found during the examination.  If the procedure report does not answer your questions, please call your gastroenterologist to clarify.  If you requested that your care partner not be given the details of your procedure findings, then the procedure report has been included in a sealed envelope for you to review at your convenience later.  YOU SHOULD EXPECT: Some feelings of bloating in the abdomen. Passage of more gas than usual.  Walking can help get rid of the air that was put into your GI tract during the procedure and reduce the bloating. If you had a lower endoscopy (such as a colonoscopy or flexible sigmoidoscopy) you may notice spotting of blood in your stool or on the toilet paper. If you underwent a bowel prep for your procedure, then you may not have a normal bowel movement for a few days.  DIET: Your first meal following the procedure should be a light meal and then it is ok to progress to your normal diet.  A half-sandwich or bowl of soup is an example of a good first meal.  Heavy or fried foods are harder to digest and may make you feel nauseous or bloated.  Likewise meals heavy in dairy and vegetables can cause extra gas to form and this can also increase the bloating.  Drink plenty of fluids but you should avoid alcoholic beverages for 24 hours.  ACTIVITY: Your care partner should take you home directly  after the procedure.  You should plan to take it easy, moving slowly for the rest of the day.  You can resume normal activity the day after the procedure however you should NOT DRIVE or use heavy machinery for 24 hours (because of the sedation medicines used during the test).    SYMPTOMS TO REPORT IMMEDIATELY: A gastroenterologist can be reached at any hour.  During normal business hours, 8:30 AM to 5:00 PM Monday through Friday, call [phone].  After hours and on weekends, please call the GI answering service at [phone] who will take a message and have the physician on call contact you.   Following lower endoscopy (colonoscopy or flexible sigmoidoscopy):  Excessive amounts of blood in the stool  Significant tenderness or worsening of abdominal pains  Swelling of the abdomen that is new, acute  Fever of 100F or higher   FOLLOW UP: If any biopsies were taken you will be contacted by phone or by letter within the next 1-3 weeks.  Call your gastroenterologist if you have not heard about the biopsies in 3 weeks.  Our staff will call the home number listed on your records the next business day following your procedure to check on you and address any questions or concerns that you may have at that time regarding the information given to you following your procedure. This is a courtesy call and so  if there is no answer at the home number and we have not heard from you through the emergency physician on call, we will assume that you have returned to your regular daily activities without incident.  SIGNATURES/CONFIDENTIALITY: You and/or your care partner have signed paperwork which will be entered into your electronic medical record.  These signatures attest to the fact that that the information above on your After Visit Summary has been reviewed and is understood.  Full responsibility of the confidentiality of this discharge information lies with you and/or your care-partner.   Resume  medications. Information given on polyps and hemorrhoids with discharge instructions.

## 2013-06-20 ENCOUNTER — Telehealth: Payer: Self-pay | Admitting: *Deleted

## 2013-06-20 NOTE — Telephone Encounter (Signed)
  Follow up Call-no answer, left message to call if questions or concerns.     

## 2013-06-21 ENCOUNTER — Telehealth: Payer: Self-pay

## 2013-06-21 NOTE — Telephone Encounter (Signed)
Message copied by Marlon Pel on Fri Jun 21, 2013  9:10 AM ------      Message from: Silvano Rusk E      Created: Wed Jun 19, 2013 11:08 AM      Regarding: hemorrhoid ligation       This lady is in need of a hemorrhoid ligation appointment - September for first one I guess            Please arrange            Just finished colonoscopy so can call tomorow ------

## 2013-06-21 NOTE — Telephone Encounter (Signed)
Patient is scheduled for session #1 on 07/26/13 11:00

## 2013-06-25 ENCOUNTER — Encounter: Payer: Self-pay | Admitting: Internal Medicine

## 2013-06-25 DIAGNOSIS — Z860101 Personal history of adenomatous and serrated colon polyps: Secondary | ICD-10-CM | POA: Insufficient documentation

## 2013-06-25 DIAGNOSIS — Z860100 Personal history of colon polyps, unspecified: Secondary | ICD-10-CM

## 2013-06-25 HISTORY — DX: Personal history of colonic polyps: Z86.010

## 2013-06-25 HISTORY — DX: Personal history of colon polyps, unspecified: Z86.0100

## 2013-06-25 NOTE — Progress Notes (Signed)
Quick Note:  2 adenomas - 4 hyperplastic/prolapse polyps Repeat colon 2019 ______

## 2013-07-26 ENCOUNTER — Encounter: Payer: Self-pay | Admitting: Internal Medicine

## 2013-07-26 ENCOUNTER — Ambulatory Visit (INDEPENDENT_AMBULATORY_CARE_PROVIDER_SITE_OTHER): Payer: BC Managed Care – PPO | Admitting: Internal Medicine

## 2013-07-26 VITALS — BP 122/82 | HR 72 | Ht 67.5 in | Wt 249.1 lb

## 2013-07-26 DIAGNOSIS — K644 Residual hemorrhoidal skin tags: Secondary | ICD-10-CM

## 2013-07-26 DIAGNOSIS — K648 Other hemorrhoids: Secondary | ICD-10-CM

## 2013-07-26 MED ORDER — AMBULATORY NON FORMULARY MEDICATION: 1.0000 "application " | Freq: Three times a day (TID) | Status: DC

## 2013-07-26 MED ORDER — BENEFIBER PO POWD: ORAL | Status: DC

## 2013-07-26 MED ORDER — LIDOCAINE (ANORECTAL) 5 % EX CREA: 1.0000 "application " | TOPICAL_CREAM | Freq: Three times a day (TID) | CUTANEOUS | Status: DC | PRN

## 2013-07-26 NOTE — Progress Notes (Signed)
Patient ID: Krystal Allen, female   DOB: 1955-12-22, 57 y.o.   MRN: [id number]  The patient has symptomatic hemorrhoids in all positions identified at recent colonoscopy. She has Grade 2-3 prolapse by history with frequent rectal bleeding. Does strain to stool at times. Does not have prolonged stooling. Have been problematic x years.  PROCEDURE NOTE: The patient presents with symptomatic grade 2-3  hemorrhoids, requesting rubber band ligation of his/her hemorrhoidal disease.  All risks, benefits and alternative forms of therapy were described and informed consent was obtained.   The anorectum was pre-medicated with 0.125% NTG and 5% lidocaine topically. She had large tags and protruding Grade 2-3 external and internal hemorrhoids, especially in the RA position.  The decision was made to band the RA internal hemorrhoid, and the [place] was used to perform band ligation without complication.  Digital anorectal examination was then performed to assure proper positioning of the band, and to adjust the banded tissue as required. The patient was discharged home without pain or other issues.  Dietary and behavioral recommendations were given and along with follow-up instructions.     The following adjunctive treatments were recommended:  0.125% NTG ointment tid Benefiber 2 tbsp daily 5% lidocaine prn  The patient will return in about 2 weeks for  follow-up and possible additional banding as required. No complications were encountered and the patient tolerated the procedure well.

## 2013-07-26 NOTE — Progress Notes (Deleted)
  Subjective:    Patient ID: Krystal Allen, female    DOB: 1956-05-27, 57 y.o.   MRN: [id number]  HPI    Review of Systems     Objective:   Physical Exam        Assessment & Plan:

## 2013-07-26 NOTE — Patient Instructions (Addendum)
HEMORRHOID BANDING PROCEDURE    FOLLOW-UP CARE   1. The procedure you have had should have been relatively painless since the banding of the area involved does not have nerve endings and there is no pain sensation.  The rubber band cuts off the blood supply to the hemorrhoid and the band may fall off as soon as 48 hours after the banding (the band may occasionally be seen in the toilet bowl following a bowel movement). You may notice a temporary feeling of fullness in the rectum which should respond adequately to plain Tylenol or Motrin.  2. Following the banding, avoid strenuous exercise that evening and resume full activity the next day.  A sitz bath (soaking in a warm tub) or bidet is soothing, and can be useful for cleansing the area after bowel movements.     3. To avoid constipation, take two tablespoons of natural wheat bran, natural oat bran, flax, Benefiber or any over the counter fiber supplement and increase your water intake to 7-8 glasses daily.    4. Unless you have been prescribed anorectal medication, do not put anything inside your rectum for two weeks: No suppositories, enemas, fingers, etc.  5. Occasionally, you may have more bleeding than usual after the banding procedure.  This is often from the untreated hemorrhoids rather than the treated one.  Don't be concerned if there is a tablespoon or so of blood.  If there is more blood than this, lie flat with your bottom higher than your head and apply an ice pack to the area. If the bleeding does not stop within a half an hour or if you feel faint, call our office at [phone] or go to the emergency room.  6. Problems are not common; however, if there is a substantial amount of bleeding, severe pain, chills, fever or difficulty passing urine (very rare) or other problems, you should call us at [phone] or report to the nearest emergency room.  7. Do not stay seated continuously for more than 2-3 hours for a day or two  after the procedure.  Tighten your buttock muscles 10-15 times every two hours and take 10-15 deep breaths every 1-2 hours.  Do not spend more than a few minutes on the toilet if you cannot empty your bowel; instead re-visit the toilet at a later time.    We have sent the following medications to your pharmacy for you to pick up at your convenience: Nitroglycerine oint. This has been sent to [facility].  Use the recticare and Benefiber as directed.  Patient Drug Education for Nitroglycerin Ointment   A pea-sized drop should be placed on the tip of your finger and then gently placed inside the anus. The finger should be inserted 1/3 - 1/2 its length and may be covered with a plastic glove or finger cot. You may use Vaseline to help coat the finger or dilute the ointment.  The first few applications should be taken lying down, as mild light-headedness or a brief headache may occur.  The most common side effect - a headache. It is usually brief and mild, but may require Tylenol or Advil. You may dilute the NTG further with Vaseline to decrease the headaches. As the treatment progresses and the hemorrhoid begins to heal, the headaches will tend to dissipate. Other side effects include lightheadedness, flushing, dizziness, nervousness, nausea, and vomiting. If any of these side effects persist or worsen, notify us promptly. Stop using the NTG and notify  us immediately if you develop the rare side effects of severe dizziness, fainting, fast/pounding heartbeat, paleness, sweating, blurred vision, dry mouth, dark urine, bluish lips/skin/nails, unusual tiredness, severe weakness, irregular heartbeat, seizures, or chest pain. Serious allergic reactions are unusual, but seek immediate medical attention if you develop a rash, swelling, dizziness, or trouble breathing.  Tell us if you are allergic to nitrates, have severe anemia, low blood pressure, dehydration, chronic heart failure, cardiomyopathy,  recent heart attack, increased pressure in the brain, or exposure to nitrates while on the job. Do not use NTG while driving or working around machinery if you are drowsy, dizzy, have lightheadedness, or blurred vision. Limit alcoholic beverages. To minimize dizziness and lightheadedness, get up slowly when rising from a sitting or lying position. The elderly may be more prone to dizziness and falling. While there are not adequate studies to confirm the safety of NTG in pregnant or breast feeding women, it has been used without incident so far. We recommend waiting at least one hour after applying the NTG ointment before breast feeding.  Do not use NTG ointment if you are taking drugs for sexual problems [e.g., sildenafil (Viagra), tadalafil (Cialis), vardenafil (Levitra)]. Use caution before taking cough-and-cold products, diet aids, or NSAIDs preparations because they may contain ingredients that could increase your blood pressure, cause a fast heartbeat, or increase chest pain (e.g., pseudoephedrine, phenylephrine, chlorpheniramine, diphenhydramine, clemastine, ibuprofen, and naproxen). Tell us if you drink alcohol, take alteplase, migraine drugs (ergotamine), water pills/diuretics such as furosemide or hydrochlorothiazide, or other drugs for high blood pressure (beta blockers, calcium channel blockers, ACE inhibitors).  Store the NTG at room temperature and keep away from light and moisture. Close the container tightly after each use. Do not store in the bathroom. Keep away from children and pets. If you have any questions or problems please call us at  [phone].  We will see you at your next appointment Oct. 10th at 11:15am.  I appreciate the opportunity to care for you.

## 2013-07-26 NOTE — Assessment & Plan Note (Signed)
Banded RA pile today

## 2013-08-09 ENCOUNTER — Encounter: Payer: Self-pay | Admitting: Internal Medicine

## 2013-08-09 ENCOUNTER — Ambulatory Visit (INDEPENDENT_AMBULATORY_CARE_PROVIDER_SITE_OTHER): Payer: BC Managed Care – PPO | Admitting: Internal Medicine

## 2013-08-09 VITALS — BP 120/80 | HR 72 | Ht 67.0 in | Wt 242.0 lb

## 2013-08-09 DIAGNOSIS — K644 Residual hemorrhoidal skin tags: Secondary | ICD-10-CM

## 2013-08-09 DIAGNOSIS — K648 Other hemorrhoids: Secondary | ICD-10-CM

## 2013-08-09 NOTE — Progress Notes (Signed)
Patient ID: Krystal Allen, female   DOB: 05/08/1956, 57 y.o.   MRN: [id number]   PROCEDURE NOTE: The patient presents with symptomatic grade 3  hemorrhoids, requesting rubber band ligation of his/her hemorrhoidal disease.  All risks, benefits and alternative forms of therapy were described and informed consent was obtained. Female staff present. Rectal exam showed slightly prolapsed RP and LL hemorrhoids and anal stenosis with external hemorrhoids and mild tenderness.  The anorectum was pre-medicated with 0.125% NTG and 5% lidocaine The decision was made to band the LL and RP  internal hemorrhoids, and the [place] was used to perform band ligation without complication.  Digital anorectal examination was then performed to assure proper positioning of the bands, and to adjust the banded tissue as required.  The patient was discharged home without pain or other issues.  Dietary and behavioral recommendations were given and along with follow-up instructions.     The following adjunctive treatments were recommended:  Increase benefiber to 3 tbsp/day Continue NTG try to apply into anal canal  (had stopped due to bleeding)   The patient will return in 2 months for  follow-up and possible additional banding as required. No complications were encountered and the patient tolerated the procedure well.

## 2013-08-09 NOTE — Assessment & Plan Note (Signed)
RP and LL piles banded To increase Benefiber to 3 tbsp/day Try to get better application of NTG for anal stenosis

## 2013-08-09 NOTE — Assessment & Plan Note (Signed)
Increase Benefiber to 3 tbsp/day Try to get better application of NTG

## 2013-08-09 NOTE — Patient Instructions (Signed)
HEMORRHOID BANDING PROCEDURE    FOLLOW-UP CARE   1. The procedure you have had should have been relatively painless since the banding of the area involved does not have nerve endings and there is no pain sensation.  The rubber band cuts off the blood supply to the hemorrhoid and the band may fall off as soon as 48 hours after the banding (the band may occasionally be seen in the toilet bowl following a bowel movement). You may notice a temporary feeling of fullness in the rectum which should respond adequately to plain Tylenol or Motrin.  2. Following the banding, avoid strenuous exercise that evening and resume full activity the next day.  A sitz bath (soaking in a warm tub) or bidet is soothing, and can be useful for cleansing the area after bowel movements.     3. To avoid constipation, take two tablespoons of natural wheat bran, natural oat bran, flax, Benefiber or any over the counter fiber supplement and increase your water intake to 7-8 glasses daily.    4. Unless you have been prescribed anorectal medication, do not put anything inside your rectum for two weeks: No suppositories, enemas, fingers, etc.  5. Occasionally, you may have more bleeding than usual after the banding procedure.  This is often from the untreated hemorrhoids rather than the treated one.  Don't be concerned if there is a tablespoon or so of blood.  If there is more blood than this, lie flat with your bottom higher than your head and apply an ice pack to the area. If the bleeding does not stop within a half an hour or if you feel faint, call our office at [phone] or go to the emergency room.  6. Problems are not common; however, if there is a substantial amount of bleeding, severe pain, chills, fever or difficulty passing urine (very rare) or other problems, you should call us at [phone] or report to the nearest emergency room.  7. Do not stay seated continuously for more than 2-3 hours for a day or two  after the procedure.  Tighten your buttock muscles 10-15 times every two hours and take 10-15 deep breaths every 1-2 hours.  Do not spend more than a few minutes on the toilet if you cannot empty your bowel; instead re-visit the toilet at a later time.    Increase your benefiber to 1 Tablespoon with each meal daily.  Try applying the nitroglycerin with your thumb since that is a shorter nail.  Let us know when you need a refill.  Purchase recticare, coupon provided today.  Follow up with [place] in 6-8 weeks.  I appreciate the opportunity to care for you.

## 2013-09-05 ENCOUNTER — Other Ambulatory Visit: Payer: Self-pay

## 2013-09-28 ENCOUNTER — Other Ambulatory Visit: Payer: Self-pay | Admitting: Internal Medicine

## 2013-11-19 ENCOUNTER — Other Ambulatory Visit: Payer: Self-pay | Admitting: Internal Medicine

## 2014-01-05 ENCOUNTER — Emergency Department (HOSPITAL_COMMUNITY)
Admission: EM | Admit: 2014-01-05 | Discharge: 2014-01-05 | Disposition: A | Discharge status: Home / Self Care | Payer: No Typology Code available for payment source | Source: Home / Self Care | Attending: Emergency Medicine | Admitting: Emergency Medicine

## 2014-01-05 ENCOUNTER — Encounter (HOSPITAL_COMMUNITY): Payer: Self-pay | Admitting: Emergency Medicine

## 2014-01-05 DIAGNOSIS — A088 Other specified intestinal infections: Secondary | ICD-10-CM

## 2014-01-05 DIAGNOSIS — A084 Viral intestinal infection, unspecified: Secondary | ICD-10-CM

## 2014-01-05 LAB — POCT I-STAT, CHEM 8
BUN: 18 mg/dL (ref 6–23)
Calcium, Ion: 1.11 mmol/L — ABNORMAL LOW (ref 1.12–1.23)
Chloride: 103 mEq/L (ref 96–112)
Creatinine, Ser: 1.4 mg/dL — ABNORMAL HIGH (ref 0.50–1.10)
GLUCOSE: 112 mg/dL — AB (ref 70–99)
HCT: 52 % — ABNORMAL HIGH (ref 36.0–46.0)
Hemoglobin: 17.7 g/dL — ABNORMAL HIGH (ref 12.0–15.0)
Potassium: 4 mEq/L (ref 3.7–5.3)
Sodium: 144 mEq/L (ref 137–147)
TCO2: 24 mmol/L (ref 0–100)

## 2014-01-05 MED ORDER — DIPHENOXYLATE-ATROPINE 2.5-0.025 MG PO TABS: 1.0000 | ORAL_TABLET | Freq: Four times a day (QID) | ORAL | Status: DC | PRN

## 2014-01-05 MED ORDER — ACETAMINOPHEN 325 MG PO TABS
ORAL_TABLET | ORAL | Status: AC
  Filled 2014-01-05: qty 2

## 2014-01-05 MED ORDER — SODIUM CHLORIDE 0.9 % IV SOLN
INTRAVENOUS | Status: DC
  Administered 2014-01-05: 20:00:00 via INTRAVENOUS

## 2014-01-05 MED ORDER — ONDANSETRON HCL 4 MG/2ML IJ SOLN
4.0000 mg | Freq: Once | INTRAMUSCULAR | Status: AC
  Administered 2014-01-05: 4 mg via INTRAVENOUS

## 2014-01-05 MED ORDER — ONDANSETRON 8 MG PO TBDP: 8.0000 mg | ORAL_TABLET | Freq: Three times a day (TID) | ORAL | Status: DC | PRN

## 2014-01-05 MED ORDER — ACETAMINOPHEN 325 MG PO TABS
650.0000 mg | ORAL_TABLET | Freq: Once | ORAL | Status: AC
  Administered 2014-01-05: 650 mg via ORAL

## 2014-01-05 MED ORDER — ONDANSETRON HCL 4 MG/2ML IJ SOLN
INTRAMUSCULAR | Status: AC
  Filled 2014-01-05: qty 2

## 2014-01-05 NOTE — Discharge Instructions (Signed)

## 2014-01-05 NOTE — ED Provider Notes (Signed)
Chief Complaint   Chief Complaint  Patient presents with  . Emesis  . Diarrhea  . Headache     History of Present Illness   Krystal Allen is a 58 year old female who since 2:30 AM today he has had nausea, vomiting, and diarrhea. She has vomited least 6 times and had 3 diarrheal stools. No blood in the vomitus or stool. No coffee-ground emesis or bilious emesis. She's had weakness, headache, dizziness, and fever. She notes pain in the epigastric area. She denies any URI symptoms. She's been exposed to a sister has been sick. No suspicious ingestions of foreign travel.  Review of Systems   Other than as noted above, the patient denies any of the following symptoms: Systemic:  No fevers, chills, sweats, weight loss or gain, fatigue, or tiredness. ENT:  No nasal congestion, rhinorrhea, or sore throat. Lungs:  No cough, wheezing, or shortness of breath. Cardiac:  No chest pain, syncope, or presyncope. GI:  No abdominal pain, nausea, vomiting, anorexia, diarrhea, constipation, blood in stool or vomitus. GU:  No dysuria, frequency, or urgency.  [place]   Past medical history, family history, social history, meds, and allergies were reviewed.  She's allergic to penicillin and sulfa. She has hypertension and hypercholesterolemia. Current meds include aspirin, lisinopril, ranitidine, and simvastatin.  Physical Exam     Vital signs:  BP 163/139  Pulse 98  Temp(Src) 100.2 F (37.9 C) (Oral)  Resp 26  SpO2 98% Filed Vitals:   01/05/14 1851 01/05/14 1935 Supine  01/05/14 1937 Sitting  01/05/14 1939 Standing   BP: 140/84 138/84 126/80 163/139  Pulse: 96 90 94 98  Temp: 100.2 F (37.9 C)     TempSrc: Oral     Resp: 26     SpO2: 98%      General:  Alert and oriented.  In no distress.  Skin warm and dry.  Good skin turgor, brisk capillary refill. ENT:  No scleral icterus, moist mucous membranes, no oral lesions, pharynx clear. Lungs:  Breath sounds clear and equal bilaterally.  No  wheezes, rales, or rhonchi. Heart:  Rhythm regular, without extrasystoles.  No gallops or murmers. Abdomen:  There is pain to palpation in the periumbilical area, left lower cautery, and the epigastrium. No guarding or rebound. No organomegaly or mass. Bowel sounds are hyperactive. Skin: Clear, warm, and dry.  Good turgor.  Brisk capillary refill.  Labs   Results for orders placed during the hospital encounter of 01/05/14  POCT I-STAT, CHEM 8      Result Value Ref Range   Sodium 144  137 - 147 mEq/L   Potassium 4.0  3.7 - 5.3 mEq/L   Chloride 103  96 - 112 mEq/L   BUN 18  6 - 23 mg/dL   Creatinine, Ser 1.40 (*) 0.50 - 1.10 mg/dL   Glucose, Bld 112 (*) 70 - 99 mg/dL   Calcium, Ion 1.11 (*) 1.12 - 1.23 mmol/L   TCO2 24  0 - 100 mmol/L   Hemoglobin 17.7 (*) 12.0 - 15.0 g/dL   HCT 52.0 (*) 36.0 - 46.0 %     Course in Urgent [place]   Patient was given 1 L of normal saline intravenously and Zofran 4 mg IV. She was also given acetaminophen 650 mg by mouth. She was allowed to drink ginger ale and kept this down without any difficulty. After the IV fluids and medications, she felt better and felt like she could go home. She was informed of the  elevated creatinine and I suggested she get this rechecked again in a couple weeks by her primary care physician.   Assessment   The encounter diagnosis was Viral gastroenteritis.  With possibly mild dehydration.  Plan   1.  Meds:  The following meds were prescribed:   New Prescriptions   DIPHENOXYLATE-ATROPINE (LOMOTIL) 2.5-0.025 MG PER TABLET    Take 1 tablet by mouth 4 (four) times daily as needed for diarrhea or loose stools.   ONDANSETRON (ZOFRAN ODT) 8 MG DISINTEGRATING TABLET    Take 1 tablet (8 mg total) by mouth every 8 (eight) hours as needed for nausea.    2.  Patient Education/Counseling:  The patient was given appropriate handouts, self care instructions, and instructed in symptomatic relief. The patient was told to stay on  clear liquids for the remainder of the day, then advance to a B.R.A.T. diet starting tomorrow.   3.  Follow up:  The patient was told to follow up here if no better in 2 to 3 days, or sooner if becoming worse in any way, and given some red flag symptoms such as persistent vomitng, high fever, severe abdominal pain, or any GI bleeding which would prompt immediate return.         Harden Mo, MD 01/05/14 [phone]

## 2014-01-05 NOTE — ED Notes (Signed)
C/O waking at 0230 vomiting.  Has been unable to keep down any PO fluids.  Also has had 3 episodes watery diarrhea today.  C/O severe HA/head pressure.

## 2014-04-13 ENCOUNTER — Other Ambulatory Visit: Payer: Self-pay | Admitting: Internal Medicine

## 2014-07-01 ENCOUNTER — Encounter (HOSPITAL_COMMUNITY): Payer: Self-pay | Admitting: Emergency Medicine

## 2014-07-01 ENCOUNTER — Emergency Department (HOSPITAL_COMMUNITY): Payer: No Typology Code available for payment source

## 2014-07-01 ENCOUNTER — Emergency Department (HOSPITAL_COMMUNITY)
Admission: EM | Admit: 2014-07-01 | Discharge: 2014-07-01 | Disposition: A | Discharge status: Home / Self Care | Payer: No Typology Code available for payment source | Attending: Emergency Medicine | Admitting: Emergency Medicine

## 2014-07-01 DIAGNOSIS — Z792 Long term (current) use of antibiotics: Secondary | ICD-10-CM | POA: Insufficient documentation

## 2014-07-01 DIAGNOSIS — F17200 Nicotine dependence, unspecified, uncomplicated: Secondary | ICD-10-CM | POA: Insufficient documentation

## 2014-07-01 DIAGNOSIS — Z791 Long term (current) use of non-steroidal anti-inflammatories (NSAID): Secondary | ICD-10-CM | POA: Diagnosis not present

## 2014-07-01 DIAGNOSIS — J449 Chronic obstructive pulmonary disease, unspecified: Secondary | ICD-10-CM | POA: Insufficient documentation

## 2014-07-01 DIAGNOSIS — M1990 Unspecified osteoarthritis, unspecified site: Secondary | ICD-10-CM | POA: Diagnosis not present

## 2014-07-01 DIAGNOSIS — Z79899 Other long term (current) drug therapy: Secondary | ICD-10-CM | POA: Diagnosis not present

## 2014-07-01 DIAGNOSIS — Z860100 Personal history of colon polyps, unspecified: Secondary | ICD-10-CM | POA: Insufficient documentation

## 2014-07-01 DIAGNOSIS — K219 Gastro-esophageal reflux disease without esophagitis: Secondary | ICD-10-CM | POA: Diagnosis not present

## 2014-07-01 DIAGNOSIS — E785 Hyperlipidemia, unspecified: Secondary | ICD-10-CM | POA: Diagnosis not present

## 2014-07-01 DIAGNOSIS — M546 Pain in thoracic spine: Secondary | ICD-10-CM

## 2014-07-01 DIAGNOSIS — Z880 Allergy status to penicillin: Secondary | ICD-10-CM | POA: Insufficient documentation

## 2014-07-01 DIAGNOSIS — Z8659 Personal history of other mental and behavioral disorders: Secondary | ICD-10-CM | POA: Diagnosis not present

## 2014-07-01 DIAGNOSIS — IMO0002 Reserved for concepts with insufficient information to code with codable children: Secondary | ICD-10-CM | POA: Diagnosis not present

## 2014-07-01 DIAGNOSIS — R079 Chest pain, unspecified: Secondary | ICD-10-CM | POA: Insufficient documentation

## 2014-07-01 DIAGNOSIS — J4489 Other specified chronic obstructive pulmonary disease: Secondary | ICD-10-CM | POA: Insufficient documentation

## 2014-07-01 DIAGNOSIS — Z8619 Personal history of other infectious and parasitic diseases: Secondary | ICD-10-CM | POA: Diagnosis not present

## 2014-07-01 DIAGNOSIS — Z7982 Long term (current) use of aspirin: Secondary | ICD-10-CM | POA: Diagnosis not present

## 2014-07-01 DIAGNOSIS — R42 Dizziness and giddiness: Secondary | ICD-10-CM | POA: Insufficient documentation

## 2014-07-01 DIAGNOSIS — I10 Essential (primary) hypertension: Secondary | ICD-10-CM | POA: Insufficient documentation

## 2014-07-01 LAB — I-STAT TROPONIN, ED
Troponin i, poc: 0 ng/mL (ref 0.00–0.08)
Troponin i, poc: 0 ng/mL (ref 0.00–0.08)

## 2014-07-01 LAB — CBC
HCT: 41.3 % (ref 36.0–46.0)
Hemoglobin: 14.3 g/dL (ref 12.0–15.0)
MCH: 30.8 pg (ref 26.0–34.0)
MCHC: 34.6 g/dL (ref 30.0–36.0)
MCV: 88.8 fL (ref 78.0–100.0)
PLATELETS: 223 10*3/uL (ref 150–400)
RBC: 4.65 MIL/uL (ref 3.87–5.11)
RDW: 17.8 % — AB (ref 11.5–15.5)
WBC: 6.1 10*3/uL (ref 4.0–10.5)

## 2014-07-01 LAB — BASIC METABOLIC PANEL
Anion gap: 13 (ref 5–15)
BUN: 15 mg/dL (ref 6–23)
CALCIUM: 9.2 mg/dL (ref 8.4–10.5)
CHLORIDE: 107 meq/L (ref 96–112)
CO2: 22 mEq/L (ref 19–32)
Creatinine, Ser: 1.1 mg/dL (ref 0.50–1.10)
GFR calc non Af Amer: 54 mL/min — ABNORMAL LOW (ref >90)
GFR, EST AFRICAN AMERICAN: 63 mL/min — AB (ref >90)
Glucose, Bld: 99 mg/dL (ref 70–99)
Potassium: 4.1 mEq/L (ref 3.7–5.3)
Sodium: 142 mEq/L (ref 137–147)

## 2014-07-01 LAB — D-DIMER, QUANTITATIVE (NOT AT ARMC): D DIMER QUANT: 0.66 ug{FEU}/mL — AB (ref 0.00–0.48)

## 2014-07-01 MED ORDER — LISINOPRIL 20 MG PO TABS: 20.0000 mg | ORAL_TABLET | Freq: Every day | ORAL | Status: DC

## 2014-07-01 MED ORDER — IOHEXOL 350 MG/ML SOLN
100.0000 mL | Freq: Once | INTRAVENOUS | Status: AC | PRN
  Administered 2014-07-01: 100 mL via INTRAVENOUS

## 2014-07-01 MED ORDER — LISINOPRIL 20 MG PO TABS
20.0000 mg | ORAL_TABLET | Freq: Once | ORAL | Status: AC
  Administered 2014-07-01: 20 mg via ORAL
  Filled 2014-07-01: qty 1

## 2014-07-01 MED ORDER — LISINOPRIL 10 MG PO TABS: 10.0000 mg | ORAL_TABLET | Freq: Once | ORAL | Status: DC

## 2014-07-01 MED ORDER — ASPIRIN 81 MG PO CHEW
324.0000 mg | CHEWABLE_TABLET | Freq: Once | ORAL | Status: AC
  Administered 2014-07-01: 324 mg via ORAL
  Filled 2014-07-01: qty 4

## 2014-07-01 NOTE — ED Notes (Signed)
Lightheaded, shoulder pian and dizziness with back pain, onset today

## 2014-07-01 NOTE — ED Provider Notes (Signed)
CSN: [id number]     Arrival date & time 07/01/14  1039 History   First MD Initiated Contact with Patient 07/01/14 1242     Chief Complaint  Patient presents with  . Dizziness  . Extremity Weakness     (Consider location/radiation/quality/duration/timing/severity/associated sxs/prior Treatment) HPI the history of present illness identifies chief complaint of dizziness and extremity weakness. The patient actually denies that she has any extremity weakness. she reports that she was at work and developed high blood pressure with a systolic pressure of 0000000. Association with this she notes she had discomfort into her right scapula shoulder area. And felt somewhat dizzy. No focal weakness numbness or extremity dysfunction is identified she reports that she also felt somewhat nauseated. At this time the only residual symptom is still some remaining discomfort in her shoulder. He denies she was doing any active work. He had not recently just been lifting or doing any exertional tasks. The patient reports that she did run out of her lisinopril. She reports she did not take today's dose.  Past Medical History  Diagnosis Date  . Hypertension   . Hyperlipidemia   . Osteoarthritis   . Depression   . GERD (gastroesophageal reflux disease)   . History of chicken pox   . Internal hemorrhoids with Grade 3 prolapse and bleeding 06/19/2013  . Personal history of colonic adenomas 06/25/2013  . COPD (chronic obstructive pulmonary disease)    Past Surgical History  Procedure Laterality Date  . Hemorrhoid surgery  55yrs agao  . Abdominal hysterectomy  72yrs ago  . Hemorrhoid banding  2014  . Colonoscopy     Family History  Problem Relation Age of Onset  . Cancer Father     prostate  . Hypertension Mother   . Diabetes Mother   . Hypertension Sister   . Heart disease Maternal Uncle   . Stroke Maternal Grandmother   . Hypertension Maternal Grandmother   . Hypertension Sister   . Colon cancer Neg Hx     History  Substance Use Topics  . Smoking status: Current Every Day Smoker -- 0.50 packs/day    Types: Cigarettes  . Smokeless tobacco: Never Used  . Alcohol Use: 0.0 oz/week     Comment: occasional   OB History   Grav Para Term Preterm Abortions TAB SAB Ect Mult Living                 Review of Systems  Constitutional: Negative for fever and chills.  HENT: Negative for congestion and sore throat.   Respiratory: Negative for cough and shortness of breath.   Cardiovascular: Negative for chest pain and leg swelling.  Gastrointestinal: Negative for vomiting, abdominal pain and diarrhea.  Genitourinary: Negative for dysuria and difficulty urinating.  Musculoskeletal: Negative for arthralgias and myalgias.  Skin: Negative for color change and rash.  Neurological: Negative for dizziness, weakness and headaches.  Psychiatric/Behavioral: Negative for confusion and agitation.      Allergies  Sulfa antibiotics; Fish allergy; and Penicillins  Home Medications   Prior to Admission medications   Medication Sig Start Date End Date Taking? Authorizing Provider  albuterol (PROVENTIL HFA;VENTOLIN HFA) 108 (90 BASE) MCG/ACT inhaler Inhale 2 puffs into the lungs every 4 (four) hours as needed for wheezing. 02/26/13   Madaline Brilliant, MD  AMBULATORY NON FORMULARY MEDICATION Apply 1 application topically 3 (three) times daily. Medication Name: Nitroglycerine ointment 0.125 %  Apply a pea sized amount internally two to three times a day 07/26/13  Gatha Mayer, MD  aspirin EC 81 MG tablet Take 1 tablet (81 mg total) by mouth daily. 02/26/13   Madaline Brilliant, MD  cyclobenzaprine (FLEXERIL) 10 MG tablet Take 0.5 tablets (5 mg total) by mouth 2 (two) times daily as needed for muscle spasms. 04/22/13   Jearld Fenton, NP  diphenoxylate-atropine (LOMOTIL) 2.5-0.025 MG per tablet Take 1 tablet by mouth 4 (four) times daily as needed for diarrhea or loose stools. 01/05/14   Harden Mo, MD  doxycycline  (DORYX) 100 MG DR capsule Take 100 mg by mouth 2 (two) times daily.    Historical Provider, MD  Lidocaine, Anorectal, (RECTICARE) 5 % CREA Apply 1 application topically 3 (three) times daily as needed. 07/26/13   Gatha Mayer, MD  lisinopril (PRINIVIL,ZESTRIL) 20 MG tablet Take 1 tablet (20 mg total) by mouth daily. 11/19/13   Jearld Fenton, NP  ondansetron (ZOFRAN ODT) 8 MG disintegrating tablet Take 1 tablet (8 mg total) by mouth every 8 (eight) hours as needed for nausea. 01/05/14   Harden Mo, MD  ranitidine (ZANTAC) 150 MG tablet Take 1 tablet (150 mg total) by mouth 2 (two) times daily.    Jearld Fenton, NP  simvastatin (ZOCOR) 10 MG tablet Take 1 tablet (10 mg total) by mouth at bedtime. 04/22/13   Jearld Fenton, NP  Wheat Dextrin (BENEFIBER) POWD 2 tablespoons daily 07/26/13   Gatha Mayer, MD   BP 161/91  Pulse 66  Temp(Src) 98.1 F (36.7 C) (Oral)  Resp 18  Ht 5\' 8"  (1.727 m)  Wt 250 lb (113.399 kg)  BMI 38.02 kg/m2  SpO2 99% Physical Exam  Constitutional: She is oriented to person, place, and time. She appears well-developed and well-nourished.  HENT:  Head: Normocephalic and atraumatic.  Eyes: EOM are normal. Pupils are equal, round, and reactive to light.  Neck: Neck supple.  Cardiovascular: Normal rate, regular rhythm, normal heart sounds and intact distal pulses.   Pulmonary/Chest: Effort normal and breath sounds normal.  Abdominal: Soft. Bowel sounds are normal. She exhibits no distension. There is no tenderness.  Musculoskeletal: Normal range of motion. She exhibits no edema.  Neurological: She is alert and oriented to person, place, and time. She has normal strength. Coordination normal. GCS eye subscore is 4. GCS verbal subscore is 5. GCS motor subscore is 6.  Skin: Skin is warm, dry and intact.  Psychiatric: She has a normal mood and affect.    ED Course  Procedures (including critical care time) Labs Review Labs Reviewed  CBC - Abnormal; Notable for the  following:    RDW 17.8 (*)    All other components within normal limits  BASIC METABOLIC PANEL - Abnormal; Notable for the following:    GFR calc non Af Amer 54 (*)    GFR calc Af Amer 63 (*)    All other components within normal limits  I-STAT TROPOININ, ED    Imaging Review No results found.   EKG Interpretation None     16.20 patient feels improved. This time she is awaiting CT scanning MDM   Final diagnoses:  Essential hypertension  Right-sided thoracic back pain   58 year old female presents having developed hypertension while at work. He has been out of her medication recently. He did have associated symptom of some right shoulder pain and thoracic back pain. Patient has a prior history of DVT. At this point in time cardiac workup was negative. Patient symptoms were improved while in the  emergency department. She was given her regular medications. CT PE study was pursued due to her prior history of DVT and her onset of lightheadedness and thoracic back pain behind the right shoulder. Pending this study the patient will be safe for discharge if CT is negative. Is advised that she needs followup with her family physician this week for recheck. She did advise that she believes she is out of her refills for her lisinopril one prescription has been provided.   Charlesetta Shanks, MD 07/01/14 [phone]

## 2014-07-01 NOTE — Discharge Instructions (Signed)

## 2014-07-01 NOTE — ED Notes (Signed)
Phlebotomy at the bedside  

## 2014-07-01 NOTE — ED Notes (Signed)
PIV 20 G placed to R AC without difficulty, good blood return noted and flushes without difficulty.. Pt did jerk her arm, "does not like being suck".

## 2014-07-02 ENCOUNTER — Other Ambulatory Visit: Payer: Self-pay | Admitting: Internal Medicine

## 2014-07-31 ENCOUNTER — Other Ambulatory Visit: Payer: Self-pay | Admitting: Internal Medicine

## 2014-08-01 ENCOUNTER — Telehealth: Payer: Self-pay

## 2014-08-01 NOTE — Telephone Encounter (Signed)
Pt needs to make an appt for CPE and labs--

## 2014-08-05 NOTE — Telephone Encounter (Signed)
Left message on voicemail.

## 2014-08-06 ENCOUNTER — Telehealth: Payer: Self-pay | Admitting: *Deleted

## 2014-08-06 MED ORDER — LISINOPRIL 20 MG PO TABS: 20.0000 mg | ORAL_TABLET | Freq: Every day | ORAL | Status: DC

## 2014-08-06 NOTE — Telephone Encounter (Signed)
Pt states she has appt to see Dr. Doug Sou 08/14/14, but she is out of her lisinopril wanting refill sent to her pharmacy. Inform pt will send a 30 day to her pharmacy until she get establish Dr. Doug Sou...Krystal Allen

## 2014-08-13 NOTE — Telephone Encounter (Signed)
i see pt has appt with Dr Celine Mans sure if pt is going to est with her but i sent a letter requesting pt call office to make a CPE appt

## 2014-08-14 ENCOUNTER — Ambulatory Visit (INDEPENDENT_AMBULATORY_CARE_PROVIDER_SITE_OTHER): Payer: No Typology Code available for payment source | Admitting: Internal Medicine

## 2014-08-14 ENCOUNTER — Other Ambulatory Visit (INDEPENDENT_AMBULATORY_CARE_PROVIDER_SITE_OTHER): Payer: No Typology Code available for payment source

## 2014-08-14 ENCOUNTER — Encounter: Payer: Self-pay | Admitting: Internal Medicine

## 2014-08-14 VITALS — BP 158/86 | HR 78 | Temp 98.3°F | Resp 12 | Ht 68.0 in | Wt 246.6 lb

## 2014-08-14 DIAGNOSIS — E669 Obesity, unspecified: Secondary | ICD-10-CM

## 2014-08-14 DIAGNOSIS — Z23 Encounter for immunization: Secondary | ICD-10-CM

## 2014-08-14 DIAGNOSIS — R911 Solitary pulmonary nodule: Secondary | ICD-10-CM

## 2014-08-14 DIAGNOSIS — I10 Essential (primary) hypertension: Secondary | ICD-10-CM

## 2014-08-14 DIAGNOSIS — R7301 Impaired fasting glucose: Secondary | ICD-10-CM | POA: Diagnosis not present

## 2014-08-14 DIAGNOSIS — E785 Hyperlipidemia, unspecified: Secondary | ICD-10-CM

## 2014-08-14 LAB — LIPID PANEL
Cholesterol: 201 mg/dL — ABNORMAL HIGH (ref 0–200)
HDL: 28 mg/dL — AB (ref >39.00)
LDL CALC: 133 mg/dL — AB (ref 0–99)
NONHDL: 173
Total CHOL/HDL Ratio: 7
Triglycerides: 198 mg/dL — ABNORMAL HIGH (ref 0.0–149.0)
VLDL: 39.6 mg/dL (ref 0.0–40.0)

## 2014-08-14 LAB — BASIC METABOLIC PANEL
BUN: 14 mg/dL (ref 6–23)
CO2: 25 mEq/L (ref 19–32)
Calcium: 9.1 mg/dL (ref 8.4–10.5)
Chloride: 107 mEq/L (ref 96–112)
Creatinine, Ser: 1.3 mg/dL — ABNORMAL HIGH (ref 0.4–1.2)
GFR: 52.59 mL/min — AB (ref >60.00)
GLUCOSE: 83 mg/dL (ref 70–99)
POTASSIUM: 4.1 meq/L (ref 3.5–5.1)
Sodium: 141 mEq/L (ref 135–145)

## 2014-08-14 LAB — HEMOGLOBIN A1C: HEMOGLOBIN A1C: 5.9 % (ref 4.6–6.5)

## 2014-08-14 MED ORDER — RANITIDINE HCL 150 MG PO TABS: 150.0000 mg | ORAL_TABLET | Freq: Two times a day (BID) | ORAL | Status: DC

## 2014-08-14 MED ORDER — SIMVASTATIN 10 MG PO TABS: 10.0000 mg | ORAL_TABLET | Freq: Every day | ORAL | Status: DC

## 2014-08-14 MED ORDER — ALBUTEROL SULFATE HFA 108 (90 BASE) MCG/ACT IN AERS: 2.0000 | INHALATION_SPRAY | RESPIRATORY_TRACT | Status: DC | PRN

## 2014-08-14 MED ORDER — LISINOPRIL 20 MG PO TABS: 20.0000 mg | ORAL_TABLET | Freq: Every day | ORAL | Status: DC

## 2014-08-14 NOTE — Assessment & Plan Note (Signed)
Spoke with patient about her borderline diabetes at this visit. We'll recheck hemoglobin A1c and also spoke with her about starting metformin for her weight loss and possible new diagnosis diabetes depending on results.

## 2014-08-14 NOTE — Progress Notes (Signed)
Pre visit review using our clinic review tool, if applicable. No additional management support is needed unless otherwise documented below in the visit note. 

## 2014-08-14 NOTE — Assessment & Plan Note (Signed)
Last lipid panel one year ago previous to starting statin. Will recheck lipid panel today since she has been on Zocor for about one year. She may need dose adjustment as her LDL was 190s last check.

## 2014-08-14 NOTE — Assessment & Plan Note (Signed)
BP slightly elevated at today's visit. Strongly encouraged her that her weight gain is likely to blame for her blood pressure elevating. Given her likely start metformin for impaired fasting glucose we'll monitor blood pressure at next visit and adjust regimen as needed.

## 2014-08-14 NOTE — Assessment & Plan Note (Signed)
4 mm nodule noted on 07/01/14 CT lung and needs to be followed up in 1 year given her history of smoking.

## 2014-08-14 NOTE — Patient Instructions (Signed)
We will check your blood work today to check on your sugars and kidneys and cholesterol and call you back with the results.   Work on increasing your exercise and we may start a medicine called metformin for sugars and for weight loss.   Come back in about 6 months so we can check on your blood pressures.   We have given you the tetanus shot and the flu shot today.  Exercise to Lose Weight Exercise and a healthy diet may help you lose weight. Your doctor may suggest specific exercises. EXERCISE IDEAS AND TIPS  Choose low-cost things you enjoy doing, such as walking, bicycling, or exercising to workout videos.  Take stairs instead of the elevator.  Walk during your lunch break.  Park your car further away from work or school.  Go to a gym or an exercise class.  Start with 5 to 10 minutes of exercise each day. Build up to 30 minutes of exercise 4 to 6 days a week.  Wear shoes with good support and comfortable clothes.  Stretch before and after working out.  Work out until you breathe harder and your heart beats faster.  Drink extra water when you exercise.  Do not do so much that you hurt yourself, feel dizzy, or get very short of breath. Exercises that burn about 150 calories:  Running 1  miles in 15 minutes.  Playing volleyball for 45 to 60 minutes.  Washing and waxing a car for 45 to 60 minutes.  Playing touch football for 45 minutes.  Walking 1  miles in 35 minutes.  Pushing a stroller 1  miles in 30 minutes.  Playing basketball for 30 minutes.  Raking leaves for 30 minutes.  Bicycling 5 miles in 30 minutes.  Walking 2 miles in 30 minutes.  Dancing for 30 minutes.  Shoveling snow for 15 minutes.  Swimming laps for 20 minutes.  Walking up stairs for 15 minutes.  Bicycling 4 miles in 15 minutes.  Gardening for 30 to 45 minutes.  Jumping rope for 15 minutes.  Washing windows or floors for 45 to 60 minutes. Document Released: 11/19/2010 Document  Revised: 01/09/2012 Document Reviewed: 11/19/2010 [facility] Patient Information 2015 [place], [place]. This information is not intended to replace advice given to you by your health care provider. Make sure you discuss any questions you have with your health care provider.

## 2014-08-14 NOTE — Progress Notes (Signed)
   Subjective:    Patient ID: Krystal Allen, female    DOB: 01/28/1956, 58 y.o.   MRN: [id number]  HPI The patient is a 58 YO woman who is coming in to establish care. She has PMH of HTN, recently found lung nodule, tobacco dependence, hyperlipidemia. She has not been exercising much recently and states that she is not a person who likes to go to the gym. She has been taking her medications as prescribed and is not having any problems with any of her medications. She denies any headaches, chest pain, shortness of breath. She does feel like she is short winded more easily now that she's put on a few pounds and would like to lose some weight. She is having some right knee pain which she also attributes to her weight.  Review of Systems  Constitutional: Negative for fever, activity change, appetite change, fatigue and unexpected weight change.  HENT: Negative.   Eyes: Negative.   Respiratory: Positive for shortness of breath. Negative for cough, chest tightness and wheezing.   Cardiovascular: Negative for chest pain, palpitations and leg swelling.  Gastrointestinal: Negative for abdominal pain, diarrhea, constipation and abdominal distention.  Musculoskeletal: Positive for arthralgias. Negative for back pain, gait problem and myalgias.  Skin: Negative.   Neurological: Negative for dizziness, weakness, light-headedness and headaches.  Psychiatric/Behavioral: Negative.       Objective:   Physical Exam  Vitals reviewed. Constitutional: She is oriented to person, place, and time. She appears well-developed and well-nourished.   obese  HENT:  Head: Normocephalic and atraumatic.  Eyes: EOM are normal.  Neck: Normal range of motion.  Cardiovascular: Normal rate and regular rhythm.   No murmur heard. Pulmonary/Chest: Effort normal and breath sounds normal. No respiratory distress. She has no wheezes. She has no rales.  Abdominal: Soft. Bowel sounds are normal. She exhibits no distension. There  is no tenderness. There is no rebound.  Neurological: She is alert and oriented to person, place, and time. Coordination normal.  Skin: Skin is warm and dry.   Filed Vitals:   08/14/14 1559  BP: 158/86  Pulse: 78  Temp: 98.3 F (36.8 C)  TempSrc: Oral  Resp: 12  Height: 5\' 8"  (1.727 m)  Weight: 246 lb 9.6 oz (111.857 kg)  SpO2: 96%      Assessment & Plan:

## 2014-08-29 ENCOUNTER — Telehealth: Payer: Self-pay | Admitting: Internal Medicine

## 2014-08-29 NOTE — Telephone Encounter (Signed)
Pt cannot afford the $66 inhaler. Pt requesting another alternative. [phone]

## 2014-08-29 NOTE — Telephone Encounter (Signed)
MD rx albuterol inhaler on 08/14/14. Pls advise...Johny Chess

## 2014-09-04 MED ORDER — ALBUTEROL SULFATE HFA 108 (90 BASE) MCG/ACT IN AERS: 2.0000 | INHALATION_SPRAY | Freq: Four times a day (QID) | RESPIRATORY_TRACT | Status: DC | PRN

## 2014-09-04 NOTE — Addendum Note (Signed)
Addended by: Vertell Novak A on: 09/04/2014 12:47 PM   Modules accepted: Orders

## 2014-09-04 NOTE — Telephone Encounter (Addendum)
We can try alternatives but all inhalers will have some cost. If she continues to have breathing problems we may look into that further.

## 2014-09-04 NOTE — Telephone Encounter (Signed)
Notified pt with md response.../lmb 

## 2014-09-19 ENCOUNTER — Other Ambulatory Visit: Payer: Self-pay

## 2014-09-19 MED ORDER — LISINOPRIL 20 MG PO TABS: 20.0000 mg | ORAL_TABLET | Freq: Every day | ORAL | Status: DC

## 2014-09-21 ENCOUNTER — Encounter (HOSPITAL_COMMUNITY): Payer: Self-pay

## 2014-09-21 ENCOUNTER — Emergency Department (HOSPITAL_COMMUNITY): Payer: No Typology Code available for payment source

## 2014-09-21 ENCOUNTER — Emergency Department (HOSPITAL_COMMUNITY)
Admission: EM | Admit: 2014-09-21 | Discharge: 2014-09-21 | Disposition: A | Discharge status: Home / Self Care | Payer: No Typology Code available for payment source | Attending: Emergency Medicine | Admitting: Emergency Medicine

## 2014-09-21 DIAGNOSIS — M1990 Unspecified osteoarthritis, unspecified site: Secondary | ICD-10-CM | POA: Diagnosis not present

## 2014-09-21 DIAGNOSIS — E785 Hyperlipidemia, unspecified: Secondary | ICD-10-CM | POA: Diagnosis not present

## 2014-09-21 DIAGNOSIS — Z720 Tobacco use: Secondary | ICD-10-CM | POA: Diagnosis not present

## 2014-09-21 DIAGNOSIS — Z8739 Personal history of other diseases of the musculoskeletal system and connective tissue: Secondary | ICD-10-CM | POA: Diagnosis not present

## 2014-09-21 DIAGNOSIS — Z91040 Latex allergy status: Secondary | ICD-10-CM | POA: Diagnosis not present

## 2014-09-21 DIAGNOSIS — Z8659 Personal history of other mental and behavioral disorders: Secondary | ICD-10-CM | POA: Diagnosis not present

## 2014-09-21 DIAGNOSIS — M75102 Unspecified rotator cuff tear or rupture of left shoulder, not specified as traumatic: Secondary | ICD-10-CM | POA: Diagnosis not present

## 2014-09-21 DIAGNOSIS — I10 Essential (primary) hypertension: Secondary | ICD-10-CM | POA: Diagnosis not present

## 2014-09-21 DIAGNOSIS — Z79899 Other long term (current) drug therapy: Secondary | ICD-10-CM | POA: Diagnosis not present

## 2014-09-21 DIAGNOSIS — Z7982 Long term (current) use of aspirin: Secondary | ICD-10-CM | POA: Insufficient documentation

## 2014-09-21 DIAGNOSIS — Z880 Allergy status to penicillin: Secondary | ICD-10-CM | POA: Diagnosis not present

## 2014-09-21 DIAGNOSIS — K219 Gastro-esophageal reflux disease without esophagitis: Secondary | ICD-10-CM | POA: Insufficient documentation

## 2014-09-21 DIAGNOSIS — J449 Chronic obstructive pulmonary disease, unspecified: Secondary | ICD-10-CM | POA: Insufficient documentation

## 2014-09-21 DIAGNOSIS — M25512 Pain in left shoulder: Secondary | ICD-10-CM | POA: Diagnosis present

## 2014-09-21 MED ORDER — IBUPROFEN 400 MG PO TABS: 400.0000 mg | ORAL_TABLET | Freq: Four times a day (QID) | ORAL | Status: DC | PRN

## 2014-09-21 MED ORDER — IBUPROFEN 200 MG PO TABS
400.0000 mg | ORAL_TABLET | Freq: Once | ORAL | Status: AC
  Administered 2014-09-21: 400 mg via ORAL
  Filled 2014-09-21: qty 2

## 2014-09-21 NOTE — Discharge Instructions (Signed)
Rotator Cuff Tear The rotator cuff is four tendons that assist in the motion of the shoulder. A rotator cuff tear is a tear in one of these four tendons. It is characterized by pain and weakness of the shoulder. The rotator cuff tendons surround the shoulder ball and socket joint (humeral head). The rotator cuff tendons attach to the shoulder blade (scapula) on one side and the upper arm bone (humerus) on the other side. The rotator cuff is essential for shoulder stability and shoulder motion. SYMPTOMS   Pain around the shoulder, often at the outer portion of the upper arm.  Pain that is worse with shoulder function, especially when reaching overhead or lifting.  Weakness of the shoulder muscles.  Aching when not using your arm; often, pain awakens you at night, especially when sleeping on the affected side.  Tenderness, swelling, warmth, or redness over the outer aspect of the shoulder.  Loss of strength.  Limited motion of the shoulder, especially reaching behind (reaching into one's back pocket) or across your body.  A crackling sound (crepitation) when moving the shoulder.  Biceps tendon pain (in the front of the shoulder) and inflammation, worse with bending the elbow or lifting. CAUSES   Strain from sudden increase in amount or intensity of activity.  Direct blow or injury to the shoulder.  Aging, wear from from normal use.  Roof of the shoulder (acromial) spur. RISK INCREASES WITH:   Contact sports (football, wrestling, or boxing).  Throwing or hitting sports (baseball, tennis, or volleyball).  Weightlifting and bodybuilding.  Heavy labor.  Previous injury to rotator cuff.  Failure to warm up properly before activity.  Inadequate protective equipment.  Increasing age.  Spurring of the outer end of the scapula (acromion).  Cortisone injections.  Poor shoulder strength and flexibility. PREVENTION  Warm up and stretch properly before activity.  Allow time  for rest and recovery between practices and competition.  Maintain physical fitness:  Cardiovascular fitness.  Shoulder flexibility.  Strength and endurance of the rotator cuff muscles and muscles of the shoulder blade.  Learn and use proper technique when throwing or hitting. PROGNOSIS Surgery is often needed. Although, symptoms may go away by themselves. RELATED COMPLICATIONS   Persistent pain that may progress to constant pain.  Shoulder stiffness, frozen shoulder syndrome, or loss of motion.  Recurrence of symptoms, especially if treated without surgery.  Inability to return to same level of sports, even with surgery.  Persistent weakness.  Risks of surgery, including infection, bleeding, injury to nerves, shoulder stiffness, weakness, re-tearing of the rotator cuff tendon.  Deltoid detachment, acromial fracture, and persistent pain. TREATMENT Treatment involves the use of ice and medicine to reduce pain and inflammation. Strengthening and stretching exercise are usually recommended. These exercises may be completed at home or with a therapist. You may also be instructed to modify offending activities. Corticosteroid injections may be given to reduce inflammation. Surgery is usually recommended for athletes. Surgery has the best chance for a full recovery. Surgery involves:  Removal of an inflamed bursa.  Removal of an acromial spur if present.  Suturing the torn tendon back together. Rotator cuff surgeries may be preformed either arthroscopically or through an open incision. Recovery typically takes 6 to 12 months. MEDICATION  If pain medicine is necessary, then nonsteroidal anti-inflammatory medicines, such as aspirin and ibuprofen, or other minor pain relievers, such as acetaminophen, are often recommended.  Do not take pain medicine for 7 days before surgery.  Prescription pain relievers are usually  only prescribed after surgery. Use only as directed and only as  much as you need.  Corticosteroid injections may be given to reduce inflammation. However, there is a limited number of times the joint may be injected with these medicines. HEAT AND COLD  Cold treatment (icing) relieves pain and reduces inflammation. Cold treatment should be applied for 10 to 15 minutes every 2 to 3 hours for inflammation and pain and immediately after any activity that aggravates your symptoms. Use ice packs or massage the area with a piece of ice (ice massage).  Heat treatment may be used prior to performing the stretching and strengthening activities prescribed by your caregiver, physical therapist, or athletic trainer. Use a heat pack or soak the injury in warm water. SEEK MEDICAL CARE IF:   Symptoms get worse or do not improve in 4 to 6 weeks despite treatment.  You experience pain, numbness, or coldness in the hand.  Blue, gray, or dark color appears in the fingernails.  New, unexplained symptoms develop (drugs used in treatment may produce side effects). Document Released: 10/17/2005 Document Revised: 01/09/2012 Document Reviewed: 01/29/2009 [employer] Patient Information 2015 [employer], [place]. This information is not intended to replace advice given to you by your health care provider. Make sure you discuss any questions you have with your health care provider.
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
## 2014-09-21 NOTE — ED Notes (Signed)
Pt refused her last set of vitals.

## 2014-09-23 ENCOUNTER — Other Ambulatory Visit: Payer: Self-pay | Admitting: *Deleted

## 2014-09-23 MED ORDER — RANITIDINE HCL 150 MG PO TABS: 150.0000 mg | ORAL_TABLET | Freq: Two times a day (BID) | ORAL | Status: DC

## 2014-09-23 NOTE — Telephone Encounter (Signed)
Left msg on triage requesting call bck with Ref# [id number]. [facility] ref[facility] pt generic zantac # 180 w/3 refills. Gave ok to refill & send...Johny Chess

## 2015-01-06 ENCOUNTER — Other Ambulatory Visit: Payer: Self-pay | Admitting: Geriatric Medicine

## 2015-01-06 ENCOUNTER — Telehealth: Payer: Self-pay | Admitting: Internal Medicine

## 2015-01-06 MED ORDER — LISINOPRIL 20 MG PO TABS: 20.0000 mg | ORAL_TABLET | Freq: Every day | ORAL | Status: DC

## 2015-01-06 NOTE — Telephone Encounter (Signed)
Sent to pharmacy 

## 2015-01-06 NOTE — Telephone Encounter (Signed)
Pt request refill for lisinopril (PRINIVIL,ZESTRIL) 20 MG to be send to [facility].

## 2015-03-20 ENCOUNTER — Emergency Department (HOSPITAL_COMMUNITY)
Admission: EM | Admit: 2015-03-20 | Discharge: 2015-03-20 | Disposition: A | Discharge status: Home / Self Care | Payer: 59 | Attending: Emergency Medicine | Admitting: Emergency Medicine

## 2015-03-20 ENCOUNTER — Ambulatory Visit: Payer: No Typology Code available for payment source | Admitting: Internal Medicine

## 2015-03-20 ENCOUNTER — Encounter (HOSPITAL_COMMUNITY): Payer: Self-pay

## 2015-03-20 DIAGNOSIS — F419 Anxiety disorder, unspecified: Secondary | ICD-10-CM | POA: Insufficient documentation

## 2015-03-20 DIAGNOSIS — E785 Hyperlipidemia, unspecified: Secondary | ICD-10-CM | POA: Insufficient documentation

## 2015-03-20 DIAGNOSIS — Z8659 Personal history of other mental and behavioral disorders: Secondary | ICD-10-CM | POA: Insufficient documentation

## 2015-03-20 DIAGNOSIS — I10 Essential (primary) hypertension: Secondary | ICD-10-CM | POA: Diagnosis not present

## 2015-03-20 DIAGNOSIS — R51 Headache: Secondary | ICD-10-CM | POA: Diagnosis not present

## 2015-03-20 DIAGNOSIS — Z91040 Latex allergy status: Secondary | ICD-10-CM | POA: Diagnosis not present

## 2015-03-20 DIAGNOSIS — G8929 Other chronic pain: Secondary | ICD-10-CM | POA: Diagnosis not present

## 2015-03-20 DIAGNOSIS — R002 Palpitations: Secondary | ICD-10-CM | POA: Insufficient documentation

## 2015-03-20 DIAGNOSIS — K219 Gastro-esophageal reflux disease without esophagitis: Secondary | ICD-10-CM | POA: Diagnosis not present

## 2015-03-20 DIAGNOSIS — J449 Chronic obstructive pulmonary disease, unspecified: Secondary | ICD-10-CM | POA: Insufficient documentation

## 2015-03-20 DIAGNOSIS — Z79899 Other long term (current) drug therapy: Secondary | ICD-10-CM | POA: Diagnosis not present

## 2015-03-20 DIAGNOSIS — Z90710 Acquired absence of both cervix and uterus: Secondary | ICD-10-CM | POA: Diagnosis not present

## 2015-03-20 DIAGNOSIS — Z8619 Personal history of other infectious and parasitic diseases: Secondary | ICD-10-CM | POA: Diagnosis not present

## 2015-03-20 DIAGNOSIS — Z86010 Personal history of colonic polyps: Secondary | ICD-10-CM | POA: Diagnosis not present

## 2015-03-20 DIAGNOSIS — Z9889 Other specified postprocedural states: Secondary | ICD-10-CM | POA: Diagnosis not present

## 2015-03-20 DIAGNOSIS — Z880 Allergy status to penicillin: Secondary | ICD-10-CM | POA: Diagnosis not present

## 2015-03-20 DIAGNOSIS — R55 Syncope and collapse: Secondary | ICD-10-CM | POA: Diagnosis present

## 2015-03-20 DIAGNOSIS — Z7982 Long term (current) use of aspirin: Secondary | ICD-10-CM | POA: Insufficient documentation

## 2015-03-20 DIAGNOSIS — K429 Umbilical hernia without obstruction or gangrene: Secondary | ICD-10-CM | POA: Diagnosis not present

## 2015-03-20 DIAGNOSIS — Z720 Tobacco use: Secondary | ICD-10-CM | POA: Diagnosis not present

## 2015-03-20 DIAGNOSIS — R109 Unspecified abdominal pain: Secondary | ICD-10-CM

## 2015-03-20 DIAGNOSIS — R519 Headache, unspecified: Secondary | ICD-10-CM

## 2015-03-20 HISTORY — DX: Unspecified abdominal hernia without obstruction or gangrene: K46.9

## 2015-03-20 LAB — CBC WITH DIFFERENTIAL/PLATELET
Basophils Absolute: 0.1 10*3/uL (ref 0.0–0.1)
Basophils Relative: 1 % (ref 0–1)
EOS PCT: 2 % (ref 0–5)
Eosinophils Absolute: 0.2 10*3/uL (ref 0.0–0.7)
HCT: 44.6 % (ref 36.0–46.0)
Hemoglobin: 15.1 g/dL — ABNORMAL HIGH (ref 12.0–15.0)
LYMPHS ABS: 2.6 10*3/uL (ref 0.7–4.0)
Lymphocytes Relative: 39 % (ref 12–46)
MCH: 30.8 pg (ref 26.0–34.0)
MCHC: 33.9 g/dL (ref 30.0–36.0)
MCV: 91 fL (ref 78.0–100.0)
Monocytes Absolute: 0.3 10*3/uL (ref 0.1–1.0)
Monocytes Relative: 5 % (ref 3–12)
NEUTROS PCT: 53 % (ref 43–77)
Neutro Abs: 3.4 10*3/uL (ref 1.7–7.7)
PLATELETS: 254 10*3/uL (ref 150–400)
RBC: 4.9 MIL/uL (ref 3.87–5.11)
RDW: 16.4 % — AB (ref 11.5–15.5)
WBC: 6.5 10*3/uL (ref 4.0–10.5)

## 2015-03-20 LAB — I-STAT TROPONIN, ED: Troponin i, poc: 0.01 ng/mL (ref 0.00–0.08)

## 2015-03-20 LAB — BASIC METABOLIC PANEL
Anion gap: 10 (ref 5–15)
BUN: 17 mg/dL (ref 6–20)
CO2: 23 mmol/L (ref 22–32)
Calcium: 9.5 mg/dL (ref 8.9–10.3)
Chloride: 107 mmol/L (ref 101–111)
Creatinine, Ser: 1.18 mg/dL — ABNORMAL HIGH (ref 0.44–1.00)
GFR calc Af Amer: 57 mL/min — ABNORMAL LOW (ref >60)
GFR calc non Af Amer: 49 mL/min — ABNORMAL LOW (ref >60)
GLUCOSE: 94 mg/dL (ref 65–99)
POTASSIUM: 4.2 mmol/L (ref 3.5–5.1)
Sodium: 140 mmol/L (ref 135–145)

## 2015-03-20 LAB — URINALYSIS, ROUTINE W REFLEX MICROSCOPIC
Bilirubin Urine: NEGATIVE
GLUCOSE, UA: NEGATIVE mg/dL
HGB URINE DIPSTICK: NEGATIVE
Ketones, ur: NEGATIVE mg/dL
LEUKOCYTES UA: NEGATIVE
Nitrite: NEGATIVE
PH: 6 (ref 5.0–8.0)
Protein, ur: NEGATIVE mg/dL
SPECIFIC GRAVITY, URINE: 1.012 (ref 1.005–1.030)
Urobilinogen, UA: 0.2 mg/dL (ref 0.0–1.0)

## 2015-03-20 MED ORDER — KETOROLAC TROMETHAMINE 30 MG/ML IJ SOLN
30.0000 mg | Freq: Once | INTRAMUSCULAR | Status: AC
  Administered 2015-03-20: 30 mg via INTRAVENOUS
  Filled 2015-03-20: qty 1

## 2015-03-20 MED ORDER — SODIUM CHLORIDE 0.9 % IV BOLUS (SEPSIS)
1000.0000 mL | Freq: Once | INTRAVENOUS | Status: AC
  Administered 2015-03-20: 1000 mL via INTRAVENOUS
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
## 2015-03-20 NOTE — ED Notes (Signed)
Patient has an umbilical hernia and had an appointment today. Patient states she has experienced intermittent pain, but denies N/V.
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
## 2015-03-20 NOTE — ED Notes (Signed)
Pt used restroom prior to UA being ordered
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
## 2015-03-20 NOTE — ED Provider Notes (Signed)
ED ECG REPORT   Date: 03/20/2015  Rate: 67  Rhythm: normal sinus rhythm  QRS Axis: right  Intervals: normal  ST/T Wave abnormalities: normal  Conduction Disutrbances:none  Narrative Interpretation: NSR with LA enlargement  Old EKG Reviewed: unchanged  I have personally reviewed the EKG tracing and agree with the computerized printout as noted.   Ernestina Patches, MD 03/20/15 1500
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
## 2015-03-23 ENCOUNTER — Telehealth: Payer: Self-pay | Admitting: Internal Medicine

## 2015-03-23 DIAGNOSIS — K429 Umbilical hernia without obstruction or gangrene: Secondary | ICD-10-CM

## 2015-03-23 NOTE — Telephone Encounter (Signed)
Left message for patient to call back  

## 2015-03-23 NOTE — Telephone Encounter (Signed)
Placed.

## 2015-03-23 NOTE — Telephone Encounter (Signed)
Patient aware that orders have been placed for a surgeon.

## 2015-03-23 NOTE — Telephone Encounter (Signed)
Patient went to ER on Friday with a hernia and they referred her to a surgeon that does not accept her insurance. She's wondering if you can get a surgeon for her.

## 2015-03-23 NOTE — Telephone Encounter (Signed)
Would you like to put in orders for a surgeon, or should I have the patient contact the doctor that originally sent her?

## 2015-03-23 NOTE — Telephone Encounter (Signed)
Patient reports that she was instructed to see [facility] for a umbilical hernia repair.  They do not take her insurance.  She called her primary care and they told her to call here for an appt for repair.  I explained to her that we are not surgeons and can't do any type of hernia repair.  She wanted to know what she should do now.  I advised her that she should contact her insurance for a surgical group in her Network and have her primary care refer her to them.  She will call back for any GI concerns.

## 2015-04-30 ENCOUNTER — Telehealth: Payer: Self-pay | Admitting: Internal Medicine

## 2015-04-30 NOTE — Telephone Encounter (Signed)
Patient stated that Dr Chevis [facility] surgical specialist stated that patient need to stop smoking for 30 days before she can have her surgery. Could you send in a prescription for something that can help her or Is there any samples at the office she can have please advise.

## 2015-05-01 NOTE — Telephone Encounter (Signed)
We do not carry samples and there are many options to quit smoking, would she like a visit to discuss the side effects and benefits of the options?

## 2015-05-05 NOTE — Telephone Encounter (Signed)
Tried to reach patient. No answer, and her mailbox is full.

## 2015-05-13 ENCOUNTER — Ambulatory Visit (INDEPENDENT_AMBULATORY_CARE_PROVIDER_SITE_OTHER): Payer: 59 | Admitting: Internal Medicine

## 2015-05-13 ENCOUNTER — Encounter: Payer: Self-pay | Admitting: Internal Medicine

## 2015-05-13 VITALS — BP 142/84 | HR 66 | Temp 98.0°F | Resp 18 | Ht 67.0 in | Wt 242.8 lb

## 2015-05-13 DIAGNOSIS — Z720 Tobacco use: Secondary | ICD-10-CM | POA: Insufficient documentation

## 2015-05-13 MED ORDER — FUROSEMIDE 20 MG PO TABS: 20.0000 mg | ORAL_TABLET | Freq: Every day | ORAL | Status: DC

## 2015-05-13 MED ORDER — RANITIDINE HCL 150 MG PO TABS: 150.0000 mg | ORAL_TABLET | Freq: Two times a day (BID) | ORAL | Status: DC

## 2015-05-13 MED ORDER — ALBUTEROL SULFATE HFA 108 (90 BASE) MCG/ACT IN AERS: 2.0000 | INHALATION_SPRAY | Freq: Four times a day (QID) | RESPIRATORY_TRACT | Status: DC | PRN

## 2015-05-13 MED ORDER — VARENICLINE TARTRATE 0.5 MG X 11 & 1 MG X 42 PO MISC: ORAL | Status: DC

## 2015-05-13 MED ORDER — NAPROXEN 500 MG PO TABS: 500.0000 mg | ORAL_TABLET | Freq: Two times a day (BID) | ORAL | Status: DC

## 2015-05-13 NOTE — Progress Notes (Signed)
Pre visit review using our clinic review tool, if applicable. No additional management support is needed unless otherwise documented below in the visit note. 

## 2015-05-13 NOTE — Patient Instructions (Signed)
We have sent in the chantix to the pharmacy to help you stop smoking. It takes away the pleasure from the cigarettes and your body does not want them anymore.   We have also sent in your refills and some naproxen for the pain in the stomach. You can take the naproxen twice a day. We recommend to take it with food.   Smoking Cessation Quitting smoking is important to your health and has many advantages. However, it is not always easy to quit since nicotine is a very addictive drug. Oftentimes, people try 3 times or more before being able to quit. This document explains the best ways for you to prepare to quit smoking. Quitting takes hard work and a lot of effort, but you can do it. ADVANTAGES OF QUITTING SMOKING  You will live longer, feel better, and live better.  Your body will feel the impact of quitting smoking almost immediately.  Within 20 minutes, blood pressure decreases. Your pulse returns to its normal level.  After 8 hours, carbon monoxide levels in the blood return to normal. Your oxygen level increases.  After 24 hours, the chance of having a heart attack starts to decrease. Your breath, hair, and body stop smelling like smoke.  After 48 hours, damaged nerve endings begin to recover. Your sense of taste and smell improve.  After 72 hours, the body is virtually free of nicotine. Your bronchial tubes relax and breathing becomes easier.  After 2 to 12 weeks, lungs can hold more air. Exercise becomes easier and circulation improves.  The risk of having a heart attack, stroke, cancer, or lung disease is greatly reduced.  After 1 year, the risk of coronary heart disease is cut in half.  After 5 years, the risk of stroke falls to the same as a nonsmoker.  After 10 years, the risk of lung cancer is cut in half and the risk of other cancers decreases significantly.  After 15 years, the risk of coronary heart disease drops, usually to the level of a nonsmoker.  If you are  pregnant, quitting smoking will improve your chances of having a healthy baby.  The people you live with, especially any children, will be healthier.  You will have extra money to spend on things other than cigarettes. QUESTIONS TO THINK ABOUT BEFORE ATTEMPTING TO QUIT You may want to talk about your answers with your health care provider.  Why do you want to quit?  If you tried to quit in the past, what helped and what did not?  What will be the most difficult situations for you after you quit? How will you plan to handle them?  Who can help you through the tough times? Your family? Friends? A health care provider?  What pleasures do you get from smoking? What ways can you still get pleasure if you quit? Here are some questions to ask your health care provider:  How can you help me to be successful at quitting?  What medicine do you think would be best for me and how should I take it?  What should I do if I need more help?  What is smoking withdrawal like? How can I get information on withdrawal? GET READY  Set a quit date.  Change your environment by getting rid of all cigarettes, ashtrays, matches, and lighters in your home, car, or work. Do not let people smoke in your home.  Review your past attempts to quit. Think about what worked and what did not. GET  SUPPORT AND ENCOURAGEMENT You have a better chance of being successful if you have help. You can get support in many ways.  Tell your family, friends, and coworkers that you are going to quit and need their support. Ask them not to smoke around you.  Get individual, group, or telephone counseling and support. Programs are available at [employer] and health centers. Call your local health department for information about programs in your area.  Spiritual beliefs and practices may help some smokers quit.  Download a "quit meter" on your computer to keep track of quit statistics, such as how long you have gone without  smoking, cigarettes not smoked, and money saved.  Get a self-help book about quitting smoking and staying off tobacco. [place] yourself from urges to smoke. Talk to someone, go for a walk, or occupy your time with a task.  Change your normal routine. Take a different route to work. Drink tea instead of coffee. Eat breakfast in a different place.  Reduce your stress. Take a hot bath, exercise, or read a book.  Plan something enjoyable to do every day. Reward yourself for not smoking.  Explore interactive web-based programs that specialize in helping you quit. GET MEDICINE AND USE IT CORRECTLY Medicines can help you stop smoking and decrease the urge to smoke. Combining medicine with the above behavioral methods and support can greatly increase your chances of successfully quitting smoking.  Nicotine replacement therapy helps deliver nicotine to your body without the negative effects and risks of smoking. Nicotine replacement therapy includes nicotine gum, lozenges, inhalers, nasal sprays, and skin patches. Some may be available over-the-counter and others require a prescription.  Antidepressant medicine helps people abstain from smoking, but how this works is unknown. This medicine is available by prescription.  Nicotinic receptor partial agonist medicine simulates the effect of nicotine in your brain. This medicine is available by prescription. Ask your health care provider for advice about which medicines to use and how to use them based on your health history. Your health care provider will tell you what side effects to look out for if you choose to be on a medicine or therapy. Carefully read the information on the package. Do not use any other product containing nicotine while using a nicotine replacement product.  RELAPSE OR DIFFICULT SITUATIONS Most relapses occur within the first 3 months after quitting. Do not be discouraged if you start smoking again.  Remember, most people try several times before finally quitting. You may have symptoms of withdrawal because your body is used to nicotine. You may crave cigarettes, be irritable, feel very hungry, cough often, get headaches, or have difficulty concentrating. The withdrawal symptoms are only temporary. They are strongest when you first quit, but they will go away within 10-14 days. To reduce the chances of relapse, try to:  Avoid drinking alcohol. Drinking lowers your chances of successfully quitting.  Reduce the amount of caffeine you consume. Once you quit smoking, the amount of caffeine in your body increases and can give you symptoms, such as a rapid heartbeat, sweating, and anxiety.  Avoid smokers because they can make you want to smoke.  Do not let weight gain distract you. Many smokers will gain weight when they quit, usually less than 10 pounds. Eat a healthy diet and stay active. You can always lose the weight gained after you quit.  Find ways to improve your mood other than smoking. FOR MORE INFORMATION  www.smokefree.gov  Document  Released: 10/11/2001 Document Revised: 03/03/2014 Document Reviewed: 01/26/2012 [facility] Patient Information 2015 [place], [place]. This information is not intended to replace advice given to you by your health care provider. Make sure you discuss any questions you have with your health care provider.

## 2015-05-13 NOTE — Progress Notes (Signed)
   Subjective:    Patient ID: Krystal Allen, female    DOB: 01-06-1956, 60 y.o.   MRN: [id number]  HPI The patient is a 59 YO female who is coming in for smoking cessation. She was told by her surgeon that she needs to stop smoking for 1 month before the surgery. She would like to try a pill option. She has been smoking less due to not buying cigarettes. She is having some cravings but the pain from her stomach is motivating her.   Review of Systems  Constitutional: Negative for fever, activity change, appetite change, fatigue and unexpected weight change.  HENT: Negative.   Eyes: Negative.   Respiratory: Negative for cough, chest tightness and wheezing.   Cardiovascular: Negative for chest pain, palpitations and leg swelling.  Gastrointestinal: Positive for abdominal pain and abdominal distention. Negative for diarrhea and constipation.  Musculoskeletal: Positive for arthralgias. Negative for myalgias, back pain and gait problem.  Skin: Negative.   Neurological: Negative for dizziness, weakness, light-headedness and headaches.  Psychiatric/Behavioral: Negative.       Objective:   Physical Exam  Constitutional: She is oriented to person, place, and time. She appears well-developed and well-nourished.   obese  HENT:  Head: Normocephalic and atraumatic.  Eyes: EOM are normal.  Neck: Normal range of motion.  Cardiovascular: Normal rate and regular rhythm.   No murmur heard. Pulmonary/Chest: Effort normal and breath sounds normal. No respiratory distress. She has no wheezes. She has no rales.  Abdominal: Soft. Bowel sounds are normal. She exhibits distension. There is tenderness. There is no rebound.  Neurological: She is alert and oriented to person, place, and time. Coordination normal.  Skin: Skin is warm and dry.  Vitals reviewed.  Filed Vitals:   05/13/15 0810  BP: 142/84  Pulse: 66  Temp: 98 F (36.7 C)  TempSrc: Oral  Resp: 18  Height: 5\' 7"  (1.702 m)  Weight: 242 lb  12.8 oz (110.133 kg)  SpO2: 98%      Assessment & Plan:

## 2015-05-13 NOTE — Assessment & Plan Note (Signed)
Rx for chantix given to her and discussed options with her. Talked to her about the fact that she should stay on it for 3 months for best success and needs to quit within the first month.

## 2015-05-14 ENCOUNTER — Telehealth: Payer: Self-pay

## 2015-05-14 NOTE — Telephone Encounter (Signed)
PA initiated for Chantix - # [id number]

## 2015-05-22 NOTE — Telephone Encounter (Signed)
Pa denied. Alternative?

## 2015-05-25 ENCOUNTER — Telehealth: Payer: Self-pay | Admitting: Internal Medicine

## 2015-05-25 MED ORDER — BUPROPION HCL ER (SMOKING DET) 150 MG PO TB12: 150.0000 mg | ORAL_TABLET | Freq: Two times a day (BID) | ORAL | Status: DC

## 2015-05-25 NOTE — Telephone Encounter (Signed)
Patient called back.  Gave MD response.  °

## 2015-05-25 NOTE — Telephone Encounter (Signed)
Do you want to refill her chantix? Please advise, thanks.

## 2015-05-25 NOTE — Telephone Encounter (Signed)
Patient need prescription to help her stop smoking, she don't know what the name of it is,  she get off work a 3:00, please advise

## 2015-05-25 NOTE — Telephone Encounter (Signed)
Have called in zyban which is another option. She should take 1 pill daily for the first 3 days then 1 pill twice a day. Can be taken for 12 weeks.

## 2015-05-25 NOTE — Telephone Encounter (Signed)
LMOVM, script sent to pharm

## 2015-05-25 NOTE — Addendum Note (Signed)
Addended by: Vertell Novak A on: 05/25/2015 08:02 AM   Modules accepted: Orders

## 2015-05-25 NOTE — Telephone Encounter (Signed)
Please see separate encounter. Sent in zyban as her insurance denied the chantix. She should take 1 pill daily for 3 days then 1 pill twice a day of the zyban.

## 2015-05-29 ENCOUNTER — Telehealth: Payer: Self-pay | Admitting: Internal Medicine

## 2015-05-29 NOTE — Telephone Encounter (Signed)
Pt called stated that pharmacy told her Dr.Kollar has to call her insurance company and let them know why Krystal Allen need to take Zyban. Please help, pt is waiting for this.

## 2015-05-29 NOTE — Telephone Encounter (Signed)
Can you call insurance and let them know that she is taking it for smoking cessation so that she can have an operation.

## 2015-06-01 HISTORY — PX: UMBILICAL HERNIA REPAIR: SHX196

## 2015-07-03 ENCOUNTER — Other Ambulatory Visit: Payer: Self-pay | Admitting: Internal Medicine

## 2015-08-06 ENCOUNTER — Other Ambulatory Visit: Payer: Self-pay | Admitting: Internal Medicine

## 2015-09-17 ENCOUNTER — Other Ambulatory Visit: Payer: Self-pay | Admitting: Internal Medicine

## 2015-12-30 ENCOUNTER — Ambulatory Visit: Payer: Self-pay | Attending: Internal Medicine

## 2016-01-15 ENCOUNTER — Telehealth: Payer: Self-pay | Admitting: Family Medicine

## 2016-01-15 ENCOUNTER — Encounter: Payer: Self-pay | Admitting: Family Medicine

## 2016-01-15 ENCOUNTER — Ambulatory Visit: Payer: Self-pay | Attending: Family Medicine | Admitting: Family Medicine

## 2016-01-15 VITALS — BP 157/81 | HR 66 | Temp 98.2°F | Resp 16 | Ht 67.0 in | Wt 246.0 lb

## 2016-01-15 DIAGNOSIS — Z79899 Other long term (current) drug therapy: Secondary | ICD-10-CM | POA: Insufficient documentation

## 2016-01-15 DIAGNOSIS — R7301 Impaired fasting glucose: Secondary | ICD-10-CM

## 2016-01-15 DIAGNOSIS — R911 Solitary pulmonary nodule: Secondary | ICD-10-CM

## 2016-01-15 DIAGNOSIS — Z1231 Encounter for screening mammogram for malignant neoplasm of breast: Secondary | ICD-10-CM

## 2016-01-15 DIAGNOSIS — Z1159 Encounter for screening for other viral diseases: Secondary | ICD-10-CM

## 2016-01-15 DIAGNOSIS — M25511 Pain in right shoulder: Secondary | ICD-10-CM

## 2016-01-15 DIAGNOSIS — I10 Essential (primary) hypertension: Secondary | ICD-10-CM

## 2016-01-15 DIAGNOSIS — J449 Chronic obstructive pulmonary disease, unspecified: Secondary | ICD-10-CM | POA: Insufficient documentation

## 2016-01-15 DIAGNOSIS — Z8719 Personal history of other diseases of the digestive system: Secondary | ICD-10-CM | POA: Insufficient documentation

## 2016-01-15 DIAGNOSIS — F329 Major depressive disorder, single episode, unspecified: Secondary | ICD-10-CM | POA: Insufficient documentation

## 2016-01-15 DIAGNOSIS — M25512 Pain in left shoulder: Secondary | ICD-10-CM | POA: Insufficient documentation

## 2016-01-15 DIAGNOSIS — E785 Hyperlipidemia, unspecified: Secondary | ICD-10-CM

## 2016-01-15 DIAGNOSIS — E669 Obesity, unspecified: Secondary | ICD-10-CM | POA: Insufficient documentation

## 2016-01-15 DIAGNOSIS — Z7982 Long term (current) use of aspirin: Secondary | ICD-10-CM | POA: Insufficient documentation

## 2016-01-15 DIAGNOSIS — Z720 Tobacco use: Secondary | ICD-10-CM

## 2016-01-15 DIAGNOSIS — Z9889 Other specified postprocedural states: Secondary | ICD-10-CM | POA: Insufficient documentation

## 2016-01-15 DIAGNOSIS — F17210 Nicotine dependence, cigarettes, uncomplicated: Secondary | ICD-10-CM | POA: Insufficient documentation

## 2016-01-15 DIAGNOSIS — K219 Gastro-esophageal reflux disease without esophagitis: Secondary | ICD-10-CM

## 2016-01-15 LAB — LIPID PANEL
CHOL/HDL RATIO: 5.2 ratio — AB (ref <=5.0)
CHOLESTEROL: 213 mg/dL — AB (ref 125–200)
HDL: 41 mg/dL — ABNORMAL LOW (ref >=46)
LDL Cholesterol: 146 mg/dL — ABNORMAL HIGH (ref <130)
TRIGLYCERIDES: 132 mg/dL (ref <150)
VLDL: 26 mg/dL (ref <30)

## 2016-01-15 LAB — COMPLETE METABOLIC PANEL WITH GFR
ALK PHOS: 116 U/L (ref 33–130)
ALT: 13 U/L (ref 6–29)
AST: 14 U/L (ref 10–35)
Albumin: 3.8 g/dL (ref 3.6–5.1)
BILIRUBIN TOTAL: 0.4 mg/dL (ref 0.2–1.2)
BUN: 14 mg/dL (ref 7–25)
CALCIUM: 9.3 mg/dL (ref 8.6–10.4)
CO2: 24 mmol/L (ref 20–31)
CREATININE: 1.22 mg/dL — AB (ref 0.50–0.99)
Chloride: 106 mmol/L (ref 98–110)
GFR, EST AFRICAN AMERICAN: 56 mL/min — AB (ref >=60)
GFR, EST NON AFRICAN AMERICAN: 48 mL/min — AB (ref >=60)
GLUCOSE: 97 mg/dL (ref 65–99)
POTASSIUM: 4.4 mmol/L (ref 3.5–5.3)
Sodium: 140 mmol/L (ref 135–146)
Total Protein: 7.2 g/dL (ref 6.1–8.1)

## 2016-01-15 LAB — POCT GLYCOSYLATED HEMOGLOBIN (HGB A1C): Hemoglobin A1C: 5.9

## 2016-01-15 MED ORDER — LISINOPRIL 20 MG PO TABS: 20.0000 mg | ORAL_TABLET | Freq: Every day | ORAL | Status: DC

## 2016-01-15 MED ORDER — FUROSEMIDE 20 MG PO TABS: 20.0000 mg | ORAL_TABLET | Freq: Every day | ORAL | Status: DC

## 2016-01-15 MED ORDER — ALBUTEROL SULFATE HFA 108 (90 BASE) MCG/ACT IN AERS: 2.0000 | INHALATION_SPRAY | Freq: Four times a day (QID) | RESPIRATORY_TRACT | Status: DC | PRN

## 2016-01-15 MED ORDER — RANITIDINE HCL 150 MG PO TABS: 150.0000 mg | ORAL_TABLET | Freq: Two times a day (BID) | ORAL | Status: DC

## 2016-01-15 MED ORDER — VARENICLINE TARTRATE 0.5 MG X 11 & 1 MG X 42 PO MISC: ORAL | Status: DC

## 2016-01-15 MED ORDER — NAPROXEN 500 MG PO TABS: 500.0000 mg | ORAL_TABLET | Freq: Every day | ORAL | Status: DC

## 2016-01-15 MED ORDER — VARENICLINE TARTRATE 1 MG PO TABS: 1.0000 mg | ORAL_TABLET | Freq: Two times a day (BID) | ORAL | Status: DC

## 2016-01-15 NOTE — Progress Notes (Signed)
LOGO@  Subjective:  Patient ID: Krystal Allen, female    DOB: [birth year]  Age: 60 y.o. MRN: [id number]  CC: Hypertension and Establish Care   HPI Krystal Allen presents has hx of smoking, R lower lung nodule, HTN, HLD and elevated A1c she presents to establish care:  1.  Smoking: 1/2 PPD off and on for about 10-11 years. Desires to quit. Was unable to get chantix or zyban in the past due to lack of insurance coverage. Admits to DOE.   2.  Lung nodule: dx in 2015. No CP. Has DOE. Is overdue for f/u CT scan. Smoking 1/2 PPD. Pain is worse at night. Impairs sleep.   3. CHRONIC HYPERTENSION  Disease Monitoring  Blood pressure range: not checking   Chest pain: no   Dyspnea: yes, with exertion    Claudication: yes, in the L leg    Medication compliance: no, no meds for past 3 months   Medication Side Effects  Lightheadedness: no   Urinary frequency: no   Edema: yes, L leg    Impotence:    Preventitive Healthcare:  Exercise: no   Diet Pattern: eats 1 meal a day, snacks throughout the day   Salt Restriction: no  4. Shoulder pain: b/l anterior shoulder pain. X 3-4 months. No injury. Worse with raising arms overhead. No weakness in hands. Tylenol and topical pain relief meds have not helped with pain. She does not exercise.   Past Medical History  Diagnosis Date  . Hypertension   . Hyperlipidemia   . Osteoarthritis   . Depression   . GERD (gastroesophageal reflux disease)   . History of chicken pox   . Internal hemorrhoids with Grade 3 prolapse and bleeding 06/19/2013  . Personal history of colonic adenomas 06/25/2013  . COPD (chronic obstructive pulmonary disease)   . Hernia, abdominal     Past Surgical History  Procedure Laterality Date  . Hemorrhoid surgery  12yrs agao  . Abdominal hysterectomy  85yrs ago  . Hemorrhoid banding  2014  . Colonoscopy    . Umbilical hernia repair  06/2015    Family History  Problem Relation Age of Onset  . Cancer Father     prostate   . Hypertension Mother   . Diabetes Mother   . Hypertension Sister   . Heart disease Maternal Uncle   . Stroke Maternal Grandmother   . Hypertension Maternal Grandmother   . Hypertension Sister   . Colon cancer Neg Hx     Social History  Substance Use Topics  . Smoking status: Current Every Day Smoker -- 0.50 packs/day    Types: Cigarettes  . Smokeless tobacco: Never Used  . Alcohol Use: 0.0 oz/week     Comment: occasional    ROS Review of Systems  Constitutional: Negative for fever and chills.  Eyes: Negative for visual disturbance.  Respiratory: Positive for shortness of breath.   Cardiovascular: Positive for leg swelling. Negative for chest pain.  Gastrointestinal: Negative for abdominal pain and blood in stool.  Genitourinary: Vaginal pain: both shoulders x 3-4 months   Musculoskeletal: Positive for arthralgias. Negative for back pain.  Skin: Negative for rash.  Allergic/Immunologic: Negative for immunocompromised state.  Hematological: Negative for adenopathy. Does not bruise/bleed easily.  Psychiatric/Behavioral: Negative for suicidal ideas and dysphoric mood.    Objective:   Today's Vitals: BP 157/81 mmHg  Pulse 66  Temp(Src) 98.2 F (36.8 C) (Oral)  Resp 16  Ht 5\' 7"  (1.702 m)  Wt 246 lb (  111.585 kg)  BMI 38.52 kg/m2  SpO2 96%  Physical Exam  Constitutional: She is oriented to person, place, and time. She appears well-developed and well-nourished. No distress.  Obese   HENT:  Head: Normocephalic and atraumatic.  Cardiovascular: Normal rate, regular rhythm, normal heart sounds and intact distal pulses.   Pulmonary/Chest: Effort normal and breath sounds normal.  Musculoskeletal: She exhibits edema (both legs ).       Right shoulder: She exhibits decreased range of motion, tenderness and pain. She exhibits no swelling, no effusion, no crepitus, no laceration, no spasm, normal pulse and normal strength.       Left shoulder: She exhibits decreased range of  motion, tenderness and pain. She exhibits no bony tenderness, no swelling, no effusion, no crepitus, no deformity, no laceration, no spasm, normal pulse and normal strength.  Neurological: She is alert and oriented to person, place, and time.  Skin: Skin is warm and dry. No rash noted.  Psychiatric: She has a normal mood and affect.   Lab Results  Component Value Date   HGBA1C 5.9 08/14/2014    Assessment & Plan:   Problem List Items Addressed This Visit    Tobacco abuse (Chronic)   Relevant Medications   albuterol (PROVENTIL HFA;VENTOLIN HFA) 108 (90 Base) MCG/ACT inhaler   varenicline (CHANTIX STARTING MONTH PAK) 0.5 MG X 11 & 1 MG X 42 tablet   varenicline (CHANTIX CONTINUING MONTH PAK) 1 MG tablet   Lung nodule (Chronic)   Relevant Orders   CT CHEST NODULE FOLLOW UP LOW DOSE W/O   Impaired fasting glucose - Primary   Relevant Orders   HgB A1c (Completed)   Hyperlipidemia (Chronic)   Relevant Medications   lisinopril (PRINIVIL,ZESTRIL) 20 MG tablet   furosemide (LASIX) 20 MG tablet   Other Relevant Orders   Lipid Panel   COMPLETE METABOLIC PANEL WITH GFR   HTN (hypertension) (Chronic)   Relevant Medications   lisinopril (PRINIVIL,ZESTRIL) 20 MG tablet   furosemide (LASIX) 20 MG tablet   Other Relevant Orders   COMPLETE METABOLIC PANEL WITH GFR   Esophageal reflux (Chronic)   Relevant Medications   ranitidine (ZANTAC) 150 MG tablet   Bilateral shoulder pain (Chronic)   Relevant Medications   naproxen (NAPROSYN) 500 MG tablet    Other Visit Diagnoses    Need for hepatitis C screening test        Relevant Orders    Hepatitis C antibody, reflex    Visit for screening mammogram        Relevant Orders    MM DIGITAL SCREENING BILATERAL       Outpatient Encounter Prescriptions as of 01/15/2016  Medication Sig  . acetaminophen (TYLENOL) 500 MG tablet Take 1,000 mg by mouth every 6 (six) hours as needed for mild pain or headache. Reported on 01/15/2016  . albuterol  (PROVENTIL HFA;VENTOLIN HFA) 108 (90 Base) MCG/ACT inhaler Inhale 2 puffs into the lungs every 6 (six) hours as needed for wheezing or shortness of breath.  Marland Kitchen aspirin EC 81 MG tablet Take 1 tablet (81 mg total) by mouth daily. (Patient not taking: Reported on 01/15/2016)  . furosemide (LASIX) 20 MG tablet Take 1 tablet (20 mg total) by mouth daily.  Marland Kitchen lisinopril (PRINIVIL,ZESTRIL) 20 MG tablet Take 1 tablet (20 mg total) by mouth daily.  . naproxen (NAPROSYN) 500 MG tablet Take 1 tablet (500 mg total) by mouth daily with supper.  . ranitidine (ZANTAC) 150 MG tablet Take 1 tablet (150 mg total)  by mouth 2 (two) times daily.  . varenicline (CHANTIX CONTINUING MONTH PAK) 1 MG tablet Take 1 tablet (1 mg total) by mouth 2 (two) times daily.  . varenicline (CHANTIX STARTING MONTH PAK) 0.5 MG X 11 & 1 MG X 42 tablet Taper per packet insert  . [DISCONTINUED] albuterol (PROVENTIL HFA;VENTOLIN HFA) 108 (90 BASE) MCG/ACT inhaler Inhale 2 puffs into the lungs every 6 (six) hours as needed for wheezing or shortness of breath. (Patient not taking: Reported on 01/15/2016)  . [DISCONTINUED] buPROPion (ZYBAN) 150 MG 12 hr tablet Take 1 tablet (150 mg total) by mouth 2 (two) times daily. (Patient not taking: Reported on 01/15/2016)  . [DISCONTINUED] furosemide (LASIX) 20 MG tablet Take 1 tablet (20 mg total) by mouth daily. (Patient not taking: Reported on 01/15/2016)  . [DISCONTINUED] ibuprofen (ADVIL,MOTRIN) 400 MG tablet Take 1 tablet (400 mg total) by mouth every 6 (six) hours as needed. (Patient not taking: Reported on 03/20/2015)  . [DISCONTINUED] lisinopril (PRINIVIL,ZESTRIL) 20 MG tablet TAKE 1 TABLET BY MOUTH DAILY (Patient not taking: Reported on 01/15/2016)  . [DISCONTINUED] naproxen (NAPROSYN) 500 MG tablet Take 1 tablet (500 mg total) by mouth 2 (two) times daily with a meal. (Patient not taking: Reported on 01/15/2016)  . [DISCONTINUED] ranitidine (ZANTAC) 150 MG tablet Take 1 tablet (150 mg total) by mouth 2  (two) times daily. (Patient not taking: Reported on 01/15/2016)  . [DISCONTINUED] simvastatin (ZOCOR) 10 MG tablet Take 1 tablet (10 mg total) by mouth daily at 6 PM. (Patient not taking: Reported on 03/20/2015)  . [DISCONTINUED] varenicline (CHANTIX STARTING MONTH PAK) 0.5 MG X 11 & 1 MG X 42 tablet Take one 0.5 mg tablet by mouth once daily for 3 days, then increase to one 0.5 mg tablet twice daily for 4 days, then increase to one 1 mg tablet twice daily.   No facility-administered encounter medications on file as of 01/15/2016.    Follow-up: No Follow-up on file.    Boykin Nearing MD

## 2016-01-15 NOTE — Progress Notes (Signed)
Establish Care Hx HTN, cholesterol Medicine refills, no medication since December  No pain today  Tobacco user 1pack per two day  No suicidal thoughts in the past two weeks

## 2016-01-15 NOTE — Assessment & Plan Note (Signed)
A: HTN Med: non compliant P: Refilled lisinopril and lasix Exercise Low salt diet

## 2016-01-15 NOTE — Assessment & Plan Note (Signed)
Lung nodule in smoker F/u CT scan done today

## 2016-01-15 NOTE — Patient Instructions (Addendum)
Krystal Allen was seen today for hypertension and establish care.  Diagnoses and all orders for this visit:  Impaired fasting glucose -     HgB A1c  Essential hypertension -     lisinopril (PRINIVIL,ZESTRIL) 20 MG tablet; Take 1 tablet (20 mg total) by mouth daily. -     furosemide (LASIX) 20 MG tablet; Take 1 tablet (20 mg total) by mouth daily. -     COMPLETE METABOLIC PANEL WITH GFR  Hyperlipidemia -     Lipid Panel -     COMPLETE METABOLIC PANEL WITH GFR  Tobacco abuse -     albuterol (PROVENTIL HFA;VENTOLIN HFA) 108 (90 Base) MCG/ACT inhaler; Inhale 2 puffs into the lungs every 6 (six) hours as needed for wheezing or shortness of breath. -     varenicline (CHANTIX STARTING MONTH PAK) 0.5 MG X 11 & 1 MG X 42 tablet; Taper per packet insert -     varenicline (CHANTIX CONTINUING MONTH PAK) 1 MG tablet; Take 1 tablet (1 mg total) by mouth 2 (two) times daily.  Lung nodule -     CT CHEST NODULE FOLLOW UP LOW DOSE W/O; Future  Need for hepatitis C screening test -     Hepatitis C antibody, reflex  Gastroesophageal reflux disease, esophagitis presence not specified -     ranitidine (ZANTAC) 150 MG tablet; Take 1 tablet (150 mg total) by mouth 2 (two) times daily.  Bilateral shoulder pain -     naproxen (NAPROSYN) 500 MG tablet; Take 1 tablet (500 mg total) by mouth daily with supper.  Visit for screening mammogram -     MM DIGITAL SCREENING BILATERAL; Future   Smoking cessation support: smoking cessation hotline: 1-800-QUIT-NOW.  Smoking cessation classes are available through [facility]. Call [phone] or visit our website at [email or web address]   F/u in 6 weeks for hypertension and smoking cessation   Dr. Adrian Blackwater   Impingement Syndrome, Rotator Cuff, Bursitis With Rehab Impingement syndrome is a condition that involves inflammation of the tendons of the rotator cuff and the subacromial bursa, that causes pain in the shoulder. The rotator cuff  consists of four tendons and muscles that control much of the shoulder and upper arm function. The subacromial bursa is a fluid filled sac that helps reduce friction between the rotator cuff and one of the bones of the shoulder (acromion). Impingement syndrome is usually an overuse injury that causes swelling of the bursa (bursitis), swelling of the tendon (tendonitis), and/or a tear of the tendon (strain). Strains are classified into three categories. Grade 1 strains cause pain, but the tendon is not lengthened. Grade 2 strains include a lengthened ligament, due to the ligament being stretched or partially ruptured. With grade 2 strains there is still function, although the function may be decreased. Grade 3 strains include a complete tear of the tendon or muscle, and function is usually impaired. SYMPTOMS   Pain around the shoulder, often at the outer portion of the upper arm.  Pain that gets worse with shoulder function, especially when reaching overhead or lifting.  Sometimes, aching when not using the arm.  Pain that wakes you up at night.  Sometimes, tenderness, swelling, warmth, or redness over the affected area.  Loss of strength.  Limited motion of the shoulder, especially reaching behind the back (to the back pocket or to unhook bra) or across your body.  Crackling sound (crepitation) when moving the arm.  Biceps tendon pain and inflammation (in the front  of the shoulder). Worse when bending the elbow or lifting. CAUSES  Impingement syndrome is often an overuse injury, in which chronic (repetitive) motions cause the tendons or bursa to become inflamed. A strain occurs when a force is paced on the tendon or muscle that is greater than it can withstand. Common mechanisms of injury include: Stress from sudden increase in duration, frequency, or intensity of training.  Direct hit (trauma) to the shoulder.  Aging, erosion of the tendon with normal use.  Bony bump on shoulder  (acromial spur). RISK INCREASES WITH:  Contact sports (football, wrestling, boxing).  Throwing sports (baseball, tennis, volleyball).  Weightlifting and bodybuilding.  Heavy labor.  Previous injury to the rotator cuff, including impingement.  Poor shoulder strength and flexibility.  Failure to warm up properly before activity.  Inadequate protective equipment.  Old age.  Bony bump on shoulder (acromial spur). PREVENTION   Warm up and stretch properly before activity.  Allow for adequate recovery between workouts.  Maintain physical fitness:  Strength, flexibility, and endurance.  Cardiovascular fitness.  Learn and use proper exercise technique. PROGNOSIS  If treated properly, impingement syndrome usually goes away within 6 weeks. Sometimes surgery is required.  RELATED COMPLICATIONS   Longer healing time if not properly treated, or if not given enough time to heal.  Recurring symptoms, that result in a chronic condition.  Shoulder stiffness, frozen shoulder, or loss of motion.  Rotator cuff tendon tear.  Recurring symptoms, especially if activity is resumed too soon, with overuse, with a direct blow, or when using poor technique. TREATMENT  Treatment first involves the use of ice and medicine, to reduce pain and inflammation. The use of strengthening and stretching exercises may help reduce pain with activity. These exercises may be performed at home or with a therapist. If non-surgical treatment is unsuccessful after more than 6 months, surgery may be advised. After surgery and rehabilitation, activity is usually possible in 3 months.  MEDICATION  If pain medicine is needed, nonsteroidal anti-inflammatory medicines (aspirin and ibuprofen), or other minor pain relievers (acetaminophen), are often advised.  Do not take pain medicine for 7 days before surgery.  Prescription pain relievers may be given, if your caregiver thinks they are needed. Use only as  directed and only as much as you need.  Corticosteroid injections may be given by your caregiver. These injections should be reserved for the most serious cases, because they may only be given a certain number of times. HEAT AND COLD  Cold treatment (icing) should be applied for 10 to 15 minutes every 2 to 3 hours for inflammation and pain, and immediately after activity that aggravates your symptoms. Use ice packs or an ice massage.  Heat treatment may be used before performing stretching and strengthening activities prescribed by your caregiver, physical therapist, or athletic trainer. Use a heat pack or a warm water soak. SEEK MEDICAL CARE IF:   Symptoms get worse or do not improve in 4 to 6 weeks, despite treatment.  New, unexplained symptoms develop. (Drugs used in treatment may produce side effects.) EXERCISES  RANGE OF MOTION (ROM) AND STRETCHING EXERCISES - Impingement Syndrome (Rotator Cuff  Tendinitis, Bursitis) These exercises may help you when beginning to rehabilitate your injury. Your symptoms may go away with or without further involvement from your physician, physical therapist or athletic trainer. While completing these exercises, remember:   Restoring tissue flexibility helps normal motion to return to the joints. This allows healthier, less painful movement and activity.  An effective  stretch should be held for at least 30 seconds.  A stretch should never be painful. You should only feel a gentle lengthening or release in the stretched tissue. STRETCH - Flexion, Standing  Stand with good posture. With an underhand grip on your right / left hand, and an overhand grip on the opposite hand, grasp a broomstick or cane so that your hands are a little more than shoulder width apart.  Keeping your right / left elbow straight and shoulder muscles relaxed, push the stick with your opposite hand, to raise your right / left arm in front of your body and then overhead. Raise your  arm until you feel a stretch in your right / left shoulder, but before you have increased shoulder pain.  Try to avoid shrugging your right / left shoulder as your arm rises, by keeping your shoulder blade tucked down and toward your mid-back spine. Hold for __________ seconds.  Slowly return to the starting position. Repeat __________ times. Complete this exercise __________ times per day. STRETCH - Abduction, Supine  Lie on your back. With an underhand grip on your right / left hand and an overhand grip on the opposite hand, grasp a broomstick or cane so that your hands are a little more than shoulder width apart.  Keeping your right / left elbow straight and your shoulder muscles relaxed, push the stick with your opposite hand, to raise your right / left arm out to the side of your body and then overhead. Raise your arm until you feel a stretch in your right / left shoulder, but before you have increased shoulder pain.  Try to avoid shrugging your right / left shoulder as your arm rises, by keeping your shoulder blade tucked down and toward your mid-back spine. Hold for __________ seconds.  Slowly return to the starting position. Repeat __________ times. Complete this exercise __________ times per day. ROM - Flexion, Active-Assisted  Lie on your back. You may bend your knees for comfort.  Grasp a broomstick or cane so your hands are about shoulder width apart. Your right / left hand should grip the end of the stick, so that your hand is positioned "thumbs-up," as if you were about to shake hands.  Using your healthy arm to lead, raise your right / left arm overhead, until you feel a gentle stretch in your shoulder. Hold for __________ seconds.  Use the stick to assist in returning your right / left arm to its starting position. Repeat __________ times. Complete this exercise __________ times per day.  ROM - Internal Rotation, Supine   Lie on your back on a firm surface. Place your  right / left elbow about 60 degrees away from your side. Elevate your elbow with a folded towel, so that the elbow and shoulder are the same height.  Using a broomstick or cane and your strong arm, pull your right / left hand toward your body until you feel a gentle stretch, but no increase in your shoulder pain. Keep your shoulder and elbow in place throughout the exercise.  Hold for __________ seconds. Slowly return to the starting position. Repeat __________ times. Complete this exercise __________ times per day. STRETCH - Internal Rotation  Place your right / left hand behind your back, palm up.  Throw a towel or belt over your opposite shoulder. Grasp the towel with your right / left hand.  While keeping an upright posture, gently pull up on the towel, until you feel a stretch in the  front of your right / left shoulder.  Avoid shrugging your right / left shoulder as your arm rises, by keeping your shoulder blade tucked down and toward your mid-back spine.  Hold for __________ seconds. Release the stretch, by lowering your healthy hand. Repeat __________ times. Complete this exercise __________ times per day. ROM - Internal Rotation   Using an underhand grip, grasp a stick behind your back with both hands.  While standing upright with good posture, slide the stick up your back until you feel a mild stretch in the front of your shoulder.  Hold for __________ seconds. Slowly return to your starting position. Repeat __________ times. Complete this exercise __________ times per day.  STRETCH - Posterior Shoulder Capsule   Stand or sit with good posture. Grasp your right / left elbow and draw it across your chest, keeping it at the same height as your shoulder.  Pull your elbow, so your upper arm comes in closer to your chest. Pull until you feel a gentle stretch in the back of your shoulder.  Hold for __________ seconds. Repeat __________ times. Complete this exercise __________  times per day. STRENGTHENING EXERCISES - Impingement Syndrome (Rotator Cuff Tendinitis, Bursitis) These exercises may help you when beginning to rehabilitate your injury. They may resolve your symptoms with or without further involvement from your physician, physical therapist or athletic trainer. While completing these exercises, remember:  Muscles can gain both the endurance and the strength needed for everyday activities through controlled exercises.  Complete these exercises as instructed by your physician, physical therapist or athletic trainer. Increase the resistance and repetitions only as guided.  You may experience muscle soreness or fatigue, but the pain or discomfort you are trying to eliminate should never worsen during these exercises. If this pain does get worse, stop and make sure you are following the directions exactly. If the pain is still present after adjustments, discontinue the exercise until you can discuss the trouble with your clinician.  During your recovery, avoid activity or exercises which involve actions that place your injured hand or elbow above your head or behind your back or head. These positions stress the tissues which you are trying to heal. STRENGTH - Scapular Depression and Adduction   With good posture, sit on a firm chair. Support your arms in front of you, with pillows, arm rests, or on a table top. Have your elbows in line with the sides of your body.  Gently draw your shoulder blades down and toward your mid-back spine. Gradually increase the tension, without tensing the muscles along the top of your shoulders and the back of your neck.  Hold for __________ seconds. Slowly release the tension and relax your muscles completely before starting the next repetition.  After you have practiced this exercise, remove the arm support and complete the exercise in standing as well as sitting position. Repeat __________ times. Complete this exercise __________  times per day.  STRENGTH - Shoulder Abductors, Isometric  With good posture, stand or sit about 4-6 inches from a wall, with your right / left side facing the wall.  Bend your right / left elbow. Gently press your right / left elbow into the wall. Increase the pressure gradually, until you are pressing as hard as you can, without shrugging your shoulder or increasing any shoulder discomfort.  Hold for __________ seconds.  Release the tension slowly. Relax your shoulder muscles completely before you begin the next repetition. Repeat __________ times. Complete this exercise __________ times  per day.  STRENGTH - External Rotators, Isometric  Keep your right / left elbow at your side and bend it 90 degrees.  Step into a door frame so that the outside of your right / left wrist can press against the door frame without your upper arm leaving your side.  Gently press your right / left wrist into the door frame, as if you were trying to swing the back of your hand away from your stomach. Gradually increase the tension, until you are pressing as hard as you can, without shrugging your shoulder or increasing any shoulder discomfort.  Hold for __________ seconds.  Release the tension slowly. Relax your shoulder muscles completely before you begin the next repetition. Repeat __________ times. Complete this exercise __________ times per day.  STRENGTH - Supraspinatus   Stand or sit with good posture. Grasp a __________ weight, or an exercise band or tubing, so that your hand is "thumbs-up," like you are shaking hands.  Slowly lift your right / left arm in a "V" away from your thigh, diagonally into the space between your side and straight ahead. Lift your hand to shoulder height or as far as you can, without increasing any shoulder pain. At first, many people do not lift their hands above shoulder height.  Avoid shrugging your right / left shoulder as your arm rises, by keeping your shoulder blade  tucked down and toward your mid-back spine.  Hold for __________ seconds. Control the descent of your hand, as you slowly return to your starting position. Repeat __________ times. Complete this exercise __________ times per day.  STRENGTH - External Rotators  Secure a rubber exercise band or tubing to a fixed object (table, pole) so that it is at the same height as your right / left elbow when you are standing or sitting on a firm surface.  Stand or sit so that the secured exercise band is at your uninjured side.  Bend your right / left elbow 90 degrees. Place a folded towel or small pillow under your right / left arm, so that your elbow is a few inches away from your side.  Keeping the tension on the exercise band, pull it away from your body, as if pivoting on your elbow. Be sure to keep your body steady, so that the movement is coming only from your rotating shoulder.  Hold for __________ seconds. Release the tension in a controlled manner, as you return to the starting position. Repeat __________ times. Complete this exercise __________ times per day.  STRENGTH - Internal Rotators   Secure a rubber exercise band or tubing to a fixed object (table, pole) so that it is at the same height as your right / left elbow when you are standing or sitting on a firm surface.  Stand or sit so that the secured exercise band is at your right / left side.  Bend your elbow 90 degrees. Place a folded towel or small pillow under your right / left arm so that your elbow is a few inches away from your side.  Keeping the tension on the exercise band, pull it across your body, toward your stomach. Be sure to keep your body steady, so that the movement is coming only from your rotating shoulder.  Hold for __________ seconds. Release the tension in a controlled manner, as you return to the starting position. Repeat __________ times. Complete this exercise __________ times per day.  STRENGTH - Scapular  Protractors, Standing   Stand arms length  away from a wall. Place your hands on the wall, keeping your elbows straight.  Begin by dropping your shoulder blades down and toward your mid-back spine.  To strengthen your protractors, keep your shoulder blades down, but slide them forward on your rib cage. It will feel as if you are lifting the back of your rib cage away from the wall. This is a subtle motion and can be challenging to complete. Ask your caregiver for further instruction, if you are not sure you are doing the exercise correctly.  Hold for __________ seconds. Slowly return to the starting position, resting the muscles completely before starting the next repetition. Repeat __________ times. Complete this exercise __________ times per day. STRENGTH - Scapular Protractors, Supine  Lie on your back on a firm surface. Extend your right / left arm straight into the air while holding a __________ weight in your hand.  Keeping your head and back in place, lift your shoulder off the floor.  Hold for __________ seconds. Slowly return to the starting position, and allow your muscles to relax completely before starting the next repetition. Repeat __________ times. Complete this exercise __________ times per day. STRENGTH - Scapular Protractors, Quadruped  Get onto your hands and knees, with your shoulders directly over your hands (or as close as you can be, comfortably).  Keeping your elbows locked, lift the back of your rib cage up into your shoulder blades, so your mid-back rounds out. Keep your neck muscles relaxed.  Hold this position for __________ seconds. Slowly return to the starting position and allow your muscles to relax completely before starting the next repetition. Repeat __________ times. Complete this exercise __________ times per day.  STRENGTH - Scapular Retractors  Secure a rubber exercise band or tubing to a fixed object (table, pole), so that it is at the height of your  shoulders when you are either standing, or sitting on a firm armless chair.  With a palm down grip, grasp an end of the band in each hand. Straighten your elbows and lift your hands straight in front of you, at shoulder height. Step back, away from the secured end of the band, until it becomes tense.  Squeezing your shoulder blades together, draw your elbows back toward your sides, as you bend them. Keep your upper arms lifted away from your body throughout the exercise.  Hold for __________ seconds. Slowly ease the tension on the band, as you reverse the directions and return to the starting position. Repeat __________ times. Complete this exercise __________ times per day. STRENGTH - Shoulder Extensors   Secure a rubber exercise band or tubing to a fixed object (table, pole) so that it is at the height of your shoulders when you are either standing, or sitting on a firm armless chair.  With a thumbs-up grip, grasp an end of the band in each hand. Straighten your elbows and lift your hands straight in front of you, at shoulder height. Step back, away from the secured end of the band, until it becomes tense.  Squeezing your shoulder blades together, pull your hands down to the sides of your thighs. Do not allow your hands to go behind you.  Hold for __________ seconds. Slowly ease the tension on the band, as you reverse the directions and return to the starting position. Repeat __________ times. Complete this exercise __________ times per day.  STRENGTH - Scapular Retractors and External Rotators   Secure a rubber exercise band or tubing to a fixed object (  table, pole) so that it is at the height as your shoulders, when you are either standing, or sitting on a firm armless chair.  With a palm down grip, grasp an end of the band in each hand. Bend your elbows 90 degrees and lift your elbows to shoulder height, at your sides. Step back, away from the secured end of the band, until it becomes  tense.  Squeezing your shoulder blades together, rotate your shoulders so that your upper arms and elbows remain stationary, but your fists travel upward to head height.  Hold for __________ seconds. Slowly ease the tension on the band, as you reverse the directions and return to the starting position. Repeat __________ times. Complete this exercise __________ times per day.  STRENGTH - Scapular Retractors and External Rotators, Rowing   Secure a rubber exercise band or tubing to a fixed object (table, pole) so that it is at the height of your shoulders, when you are either standing, or sitting on a firm armless chair.  With a palm down grip, grasp an end of the band in each hand. Straighten your elbows and lift your hands straight in front of you, at shoulder height. Step back, away from the secured end of the band, until it becomes tense.  Step 1: Squeeze your shoulder blades together. Bending your elbows, draw your hands to your chest, as if you are rowing a boat. At the end of this motion, your hands and elbow should be at shoulder height and your elbows should be out to your sides.  Step 2: Rotate your shoulders, to raise your hands above your head. Your forearms should be vertical and your upper arms should be horizontal.  Hold for __________ seconds. Slowly ease the tension on the band, as you reverse the directions and return to the starting position. Repeat __________ times. Complete this exercise __________ times per day.  STRENGTH - Scapular Depressors  Find a sturdy chair without wheels, such as a dining room chair.  Keeping your feet on the floor, and your hands on the chair arms, lift your bottom up from the seat, and lock your elbows.  Keeping your elbows straight, allow gravity to pull your body weight down. Your shoulders will rise toward your ears.  Raise your body against gravity by drawing your shoulder blades down your back, shortening the distance between your  shoulders and ears. Although your feet should always maintain contact with the floor, your feet should progressively support less body weight, as you get stronger.  Hold for __________ seconds. In a controlled and slow manner, lower your body weight to begin the next repetition. Repeat __________ times. Complete this exercise __________ times per day.    This information is not intended to replace advice given to you by your health care provider. Make sure you discuss any questions you have with your health care provider.   Document Released: 10/17/2005 Document Revised: 11/07/2014 Document Reviewed: 01/29/2009 Elsevier Interactive Patient Education [employer].

## 2016-01-15 NOTE — Assessment & Plan Note (Signed)
A: desires to quit  P: counseling chantix  Counseled patient regarding risk of SI and depression

## 2016-01-15 NOTE — Telephone Encounter (Signed)
Patient came in requesting that her medications be switched to [facility] pharmacy. All meds prescribed by Doctor Funches. Please follow up with patients

## 2016-01-15 NOTE — Assessment & Plan Note (Signed)
A: b/l shoulder pain x 4 months suspect bursitis P: Nightly NSAID Home PT

## 2016-01-16 LAB — HEPATITIS C ANTIBODY: HCV Ab: NEGATIVE

## 2016-01-17 MED ORDER — ATORVASTATIN CALCIUM 40 MG PO TABS: 40.0000 mg | ORAL_TABLET | Freq: Every day | ORAL | Status: DC

## 2016-01-17 NOTE — Addendum Note (Signed)
Addended by: Boykin Nearing on: 01/17/2016 10:46 AM   Modules accepted: Orders, SmartSet

## 2016-01-18 NOTE — Telephone Encounter (Signed)
Unable to contact Pt "voice mail not set up yet"   PT NEEDS TO [place]

## 2016-01-19 MED FILL — !VENTOLIN HFA INHALER: 108 (90 BAS | 27 days supply | Qty: 18 | Fill #0

## 2016-01-19 MED FILL — NAPROXEN 500 MG TABLET: 500 | 30 days supply | Qty: 30 | Fill #0

## 2016-01-19 MED FILL — ATORVASTATIN 40 MG TABLET: 40 | 30 days supply | Qty: 30 | Fill #0

## 2016-01-19 MED FILL — BUPROPION SR 150 MG TABLET: 150 | 30 days supply | Qty: 60 | Fill #0

## 2016-01-19 MED FILL — LISINOPRIL 20 MG TABLET: 20 | 30 days supply | Qty: 30 | Fill #0

## 2016-01-20 ENCOUNTER — Ambulatory Visit (HOSPITAL_COMMUNITY)
Admission: RE | Admit: 2016-01-20 | Discharge: 2016-01-20 | Disposition: A | Discharge status: Home / Self Care | Payer: Self-pay | Source: Ambulatory Visit | Attending: Family Medicine | Admitting: Family Medicine

## 2016-01-20 DIAGNOSIS — I700 Atherosclerosis of aorta: Secondary | ICD-10-CM | POA: Insufficient documentation

## 2016-01-20 DIAGNOSIS — J432 Centrilobular emphysema: Secondary | ICD-10-CM | POA: Insufficient documentation

## 2016-01-20 DIAGNOSIS — R911 Solitary pulmonary nodule: Secondary | ICD-10-CM

## 2016-01-20 DIAGNOSIS — R918 Other nonspecific abnormal finding of lung field: Secondary | ICD-10-CM | POA: Insufficient documentation

## 2016-01-26 ENCOUNTER — Ambulatory Visit
Admission: RE | Admit: 2016-01-26 | Discharge: 2016-01-26 | Disposition: A | Discharge status: Home / Self Care | Payer: No Typology Code available for payment source | Source: Ambulatory Visit | Attending: Family Medicine | Admitting: Family Medicine

## 2016-01-26 DIAGNOSIS — Z1231 Encounter for screening mammogram for malignant neoplasm of breast: Secondary | ICD-10-CM

## 2016-02-16 ENCOUNTER — Emergency Department (HOSPITAL_COMMUNITY)
Admission: EM | Admit: 2016-02-16 | Discharge: 2016-02-16 | Disposition: A | Discharge status: Home / Self Care | Payer: Self-pay | Attending: Emergency Medicine | Admitting: Emergency Medicine

## 2016-02-16 ENCOUNTER — Emergency Department (HOSPITAL_COMMUNITY): Payer: Self-pay

## 2016-02-16 ENCOUNTER — Encounter (HOSPITAL_COMMUNITY): Payer: Self-pay | Admitting: *Deleted

## 2016-02-16 DIAGNOSIS — K122 Cellulitis and abscess of mouth: Secondary | ICD-10-CM

## 2016-02-16 DIAGNOSIS — K219 Gastro-esophageal reflux disease without esophagitis: Secondary | ICD-10-CM | POA: Insufficient documentation

## 2016-02-16 DIAGNOSIS — M1990 Unspecified osteoarthritis, unspecified site: Secondary | ICD-10-CM | POA: Insufficient documentation

## 2016-02-16 DIAGNOSIS — Z86010 Personal history of colonic polyps: Secondary | ICD-10-CM | POA: Insufficient documentation

## 2016-02-16 DIAGNOSIS — F17210 Nicotine dependence, cigarettes, uncomplicated: Secondary | ICD-10-CM | POA: Insufficient documentation

## 2016-02-16 DIAGNOSIS — Z8659 Personal history of other mental and behavioral disorders: Secondary | ICD-10-CM | POA: Insufficient documentation

## 2016-02-16 DIAGNOSIS — J449 Chronic obstructive pulmonary disease, unspecified: Secondary | ICD-10-CM | POA: Insufficient documentation

## 2016-02-16 DIAGNOSIS — Z7982 Long term (current) use of aspirin: Secondary | ICD-10-CM | POA: Insufficient documentation

## 2016-02-16 DIAGNOSIS — I10 Essential (primary) hypertension: Secondary | ICD-10-CM | POA: Insufficient documentation

## 2016-02-16 DIAGNOSIS — Z79899 Other long term (current) drug therapy: Secondary | ICD-10-CM | POA: Insufficient documentation

## 2016-02-16 DIAGNOSIS — Z8619 Personal history of other infectious and parasitic diseases: Secondary | ICD-10-CM | POA: Insufficient documentation

## 2016-02-16 DIAGNOSIS — E785 Hyperlipidemia, unspecified: Secondary | ICD-10-CM | POA: Insufficient documentation

## 2016-02-16 LAB — CBC WITH DIFFERENTIAL/PLATELET
BASOS ABS: 0.1 10*3/uL (ref 0.0–0.1)
BASOS PCT: 1 %
EOS ABS: 0.2 10*3/uL (ref 0.0–0.7)
Eosinophils Relative: 3 %
HEMATOCRIT: 41.6 % (ref 36.0–46.0)
Hemoglobin: 13.6 g/dL (ref 12.0–15.0)
Lymphocytes Relative: 31 %
Lymphs Abs: 2.6 10*3/uL (ref 0.7–4.0)
MCH: 28.8 pg (ref 26.0–34.0)
MCHC: 32.7 g/dL (ref 30.0–36.0)
MCV: 88.1 fL (ref 78.0–100.0)
MONO ABS: 0.5 10*3/uL (ref 0.1–1.0)
MONOS PCT: 6 %
NEUTROS ABS: 4.9 10*3/uL (ref 1.7–7.7)
Neutrophils Relative %: 59 %
PLATELETS: 270 10*3/uL (ref 150–400)
RBC: 4.72 MIL/uL (ref 3.87–5.11)
RDW: 16.7 % — AB (ref 11.5–15.5)
WBC: 8.3 10*3/uL (ref 4.0–10.5)

## 2016-02-16 LAB — I-STAT CHEM 8, ED
BUN: 17 mg/dL (ref 6–20)
CREATININE: 1.2 mg/dL — AB (ref 0.44–1.00)
Calcium, Ion: 1.16 mmol/L (ref 1.13–1.30)
Chloride: 108 mmol/L (ref 101–111)
Glucose, Bld: 98 mg/dL (ref 65–99)
HEMATOCRIT: 46 % (ref 36.0–46.0)
HEMOGLOBIN: 15.6 g/dL — AB (ref 12.0–15.0)
POTASSIUM: 4.1 mmol/L (ref 3.5–5.1)
SODIUM: 143 mmol/L (ref 135–145)
TCO2: 20 mmol/L (ref 0–100)

## 2016-02-16 MED ORDER — RACEPINEPHRINE HCL 2.25 % IN NEBU
0.5000 mL | INHALATION_SOLUTION | Freq: Once | RESPIRATORY_TRACT | Status: AC
  Administered 2016-02-16: 0.5 mL via RESPIRATORY_TRACT
  Filled 2016-02-16: qty 0.5

## 2016-02-16 MED ORDER — IOPAMIDOL (ISOVUE-300) INJECTION 61%
INTRAVENOUS | Status: AC
  Administered 2016-02-16: 75 mL
  Filled 2016-02-16: qty 75

## 2016-02-16 MED ORDER — AZITHROMYCIN 250 MG PO TABS: 250.0000 mg | ORAL_TABLET | Freq: Every day | ORAL | Status: DC

## 2016-02-16 MED ORDER — DEXAMETHASONE SODIUM PHOSPHATE 10 MG/ML IJ SOLN: 10.0000 mg | Freq: Once | INTRAMUSCULAR | Status: DC

## 2016-02-16 MED ORDER — DEXAMETHASONE SODIUM PHOSPHATE 10 MG/ML IJ SOLN
10.0000 mg | Freq: Once | INTRAMUSCULAR | Status: AC
  Administered 2016-02-16: 10 mg via INTRAVENOUS
  Filled 2016-02-16: qty 1

## 2016-02-16 MED ORDER — SODIUM CHLORIDE 0.9 % IV BOLUS (SEPSIS)
1000.0000 mL | Freq: Once | INTRAVENOUS | Status: AC
  Administered 2016-02-16: 1000 mL via INTRAVENOUS

## 2016-02-16 MED FILL — ?AZITHROMYCIN 250 MG TABLET: 250 MG | 5 days supply | Qty: 6 | Fill #0

## 2016-02-16 NOTE — ED Notes (Signed)
Pt presents via POV c/o something stuck or swollen in her throat.  Pt presents to triage drooling, immediately brought back to room.  Pt able to speak, airway intact.  MD at bedside.

## 2016-02-16 NOTE — ED Notes (Signed)
MD at bedside. 

## 2016-02-16 NOTE — ED Notes (Signed)
Patient transported to CT 

## 2016-02-16 NOTE — Discharge Instructions (Signed)
If you develop trouble breathing, drooling, or trouble swallowing then returned to the ER or call 911 immediately. Otherwise follow-up with your primary care physician and the ENT. Take antibiotics to completion even if you feel better.

## 2016-02-16 NOTE — ED Provider Notes (Addendum)
CSN: [id number]     Arrival date & time 02/16/16  0825 History   First MD Initiated Contact with Patient 02/16/16 0831     Chief Complaint  Patient presents with  . Oral Swelling   LEVEL 5 CAVEAT DUE TO RESPIRATORY DISTRESS  (Consider location/radiation/quality/duration/timing/severity/associated sxs/prior Treatment) HPI  60 year old female presents with a sensation of something being stuck in swollen in the back of her throat. She has been having drooling and trouble breathing. She states it started all of a sudden this morning. For the past 2 weeks she has been having a "cold" with some cough. No fevers. No sore throat. Has been spitting up some blood. It feels like she needs to cough something out of her throat.  Past Medical History  Diagnosis Date  . Hypertension   . Hyperlipidemia   . Osteoarthritis   . Depression   . GERD (gastroesophageal reflux disease)   . History of chicken pox   . Internal hemorrhoids with Grade 3 prolapse and bleeding 06/19/2013  . Personal history of colonic adenomas 06/25/2013  . COPD (chronic obstructive pulmonary disease) ([place])   . Hernia, abdominal    Past Surgical History  Procedure Laterality Date  . Hemorrhoid surgery  41yrs agao  . Hemorrhoid banding  2014  . Colonoscopy    . Umbilical hernia repair  06/2015  . Abdominal hysterectomy  73yrs ago    due to heavy bleeding and fibroids    Family History  Problem Relation Age of Onset  . Cancer Father     prostate  . Hypertension Mother   . Diabetes Mother   . Hypertension Sister   . Heart disease Maternal Uncle   . Stroke Maternal Grandmother   . Hypertension Maternal Grandmother   . Hypertension Sister   . Colon cancer Neg Hx    Social History  Substance Use Topics  . Smoking status: Current Every Day Smoker -- 0.50 packs/day    Types: Cigarettes  . Smokeless tobacco: Never Used  . Alcohol Use: 0.0 oz/week     Comment: occasional   OB History    No data available     Review  of Systems  Unable to perform ROS: Severe respiratory distress      Allergies  Sulfa antibiotics; Fish allergy; Penicillins; and Latex  Home Medications   Prior to Admission medications   Medication Sig Start Date End Date Taking? Authorizing Provider  acetaminophen (TYLENOL) 500 MG tablet Take 1,000 mg by mouth every 6 (six) hours as needed for mild pain or headache. Reported on 01/15/2016    Historical Provider, MD  albuterol (PROVENTIL HFA;VENTOLIN HFA) 108 (90 Base) MCG/ACT inhaler Inhale 2 puffs into the lungs every 6 (six) hours as needed for wheezing or shortness of breath. 01/15/16   Boykin Nearing, MD  aspirin EC 81 MG tablet Take 1 tablet (81 mg total) by mouth daily. Patient not taking: Reported on 01/15/2016 02/26/13   Madaline Brilliant, MD  atorvastatin (LIPITOR) 40 MG tablet Take 1 tablet (40 mg total) by mouth daily. 01/17/16   Josalyn Funches, MD  furosemide (LASIX) 20 MG tablet Take 1 tablet (20 mg total) by mouth daily. 01/15/16   Josalyn Funches, MD  lisinopril (PRINIVIL,ZESTRIL) 20 MG tablet Take 1 tablet (20 mg total) by mouth daily. 01/15/16   Josalyn Funches, MD  naproxen (NAPROSYN) 500 MG tablet Take 1 tablet (500 mg total) by mouth daily with supper. 01/15/16   Josalyn Funches, MD  ranitidine (ZANTAC) 150 MG tablet Take  1 tablet (150 mg total) by mouth 2 (two) times daily. 01/15/16   Josalyn Funches, MD  varenicline (CHANTIX CONTINUING MONTH PAK) 1 MG tablet Take 1 tablet (1 mg total) by mouth 2 (two) times daily. 01/15/16   Josalyn Funches, MD  varenicline (CHANTIX STARTING MONTH PAK) 0.5 MG X 11 & 1 MG X 42 tablet Taper per packet insert 01/15/16   Josalyn Funches, MD   BP 159/99 mmHg  Pulse 77  Temp(Src) 97.9 F (36.6 C) (Oral)  Resp 20  SpO2 98% Physical Exam  Constitutional: She is oriented to person, place, and time. She appears well-developed and well-nourished. She appears distressed.  No drooling. Intermittent stridor on deep inspiration, otherwise can talk in full  sentences. Somewhat muffled voice  HENT:  Head: Normocephalic and atraumatic.  Right Ear: External ear normal.  Left Ear: External ear normal.  Nose: Nose normal.  Mouth/Throat: Uvula is midline. No trismus in the jaw. Uvula swelling present. No posterior oropharyngeal edema, posterior oropharyngeal erythema or tonsillar abscesses.  Uvula is swollen and red some some small areas of irritation  Eyes: Right eye exhibits no discharge. Left eye exhibits no discharge.  Neck: Normal range of motion. Neck supple.  Cardiovascular: Normal rate, regular rhythm and normal heart sounds.   Pulmonary/Chest: Effort normal and breath sounds normal. She has no wheezes. She has no rales.  Abdominal: Soft. There is no tenderness.  Neurological: She is alert and oriented to person, place, and time.  Skin: Skin is warm and dry. She is not diaphoretic.  Nursing note and vitals reviewed.   ED Course  Procedures (including critical care time) Labs Review Labs Reviewed  CBC WITH DIFFERENTIAL/PLATELET - Abnormal; Notable for the following:    RDW 16.7 (*)    All other components within normal limits  I-STAT CHEM 8, ED - Abnormal; Notable for the following:    Creatinine, Ser 1.20 (*)    Hemoglobin 15.6 (*)    All other components within normal limits    Imaging Review Ct Soft Tissue Neck W Contrast  02/16/2016  CLINICAL DATA:  Throat pain and difficulty swallowing. Rule out epiglottitis. EXAM: CT NECK WITH CONTRAST TECHNIQUE: Multidetector CT imaging of the neck was performed using the standard protocol following the bolus administration of intravenous contrast. CONTRAST:  85mL ISOVUE-300 IOPAMIDOL (ISOVUE-300) INJECTION 61% COMPARISON:  None. FINDINGS: Pharynx and larynx: The patient moved during imaging through the epiglottis. Allowing for this, epiglottis is not enlarged. There is enlargement of the lingual tonsils which may be due to acute or chronic inflammation or a mass lesion. Aryepiglottic folds are  normal. The tongue and oral cavity normal.  Airway intact.  Larynx normal. Salivary glands: Parotid and submandibular glands normal bilaterally. No mass or edema or stone Thyroid: Negative Lymph nodes: No adenopathy in the neck. Vascular: Carotid artery and jugular vein patent bilaterally. Limited intracranial: Negative Visualized orbits: Negative Mastoids and visualized paranasal sinuses: Mild mucosal edema in the paranasal sinuses. No air-fluid level. Mastoid sinus clear bilaterally. Skeleton: Cervical disc and facet degeneration with spurring. No fracture and no acute bony abnormality. Upper chest: Lung apices clear.  Superior mediastinum normal. IMPRESSION: Image quality degraded by motion. The epiglottis does not appear to be thickened. There is prominence the lingual tonsil bilaterally which could be due to inflammation or tumor. No abscess. ENT evaluation suggested. Electronically Signed   By: Franchot Gallo M.D.   On: 02/16/2016 10:22   I have personally reviewed and evaluated these images and lab results  as part of my medical decision-making.   EKG Interpretation None      MDM   Final diagnoses:  Uvulitis    I believe the patient's symptoms are all coming from the uvulitis. CT scan somewhat limited but no obvious epiglottitis. After racemic epinephrine and Decadron she is feeling much better. She has no longer having the coughing and gagging. On my exam she is never having drooling. She is able to drink a whole cup of water without difficulty. Voice now normal. I believe most of her symptoms are from the foreign body sensation of the enlarged uvula. Given that she is having no trouble breathing and appears quite well, she will be discharged with antibiotics and follow-up with ENT and PCP. Discussed strict return precautions if she does develop repeat shortness of breath.    Sherwood Gambler, MD 02/16/16 [place], MD 02/16/16 1110  Sherwood Gambler, MD 02/16/16 1113

## 2016-02-23 ENCOUNTER — Ambulatory Visit: Payer: Self-pay | Attending: Family Medicine | Admitting: Family Medicine

## 2016-02-23 ENCOUNTER — Encounter: Payer: Self-pay | Admitting: Family Medicine

## 2016-02-23 VITALS — BP 119/76 | HR 74 | Temp 98.4°F | Resp 16 | Ht 68.0 in | Wt 240.0 lb

## 2016-02-23 DIAGNOSIS — IMO0001 Reserved for inherently not codable concepts without codable children: Secondary | ICD-10-CM

## 2016-02-23 DIAGNOSIS — J449 Chronic obstructive pulmonary disease, unspecified: Secondary | ICD-10-CM | POA: Insufficient documentation

## 2016-02-23 DIAGNOSIS — J439 Emphysema, unspecified: Secondary | ICD-10-CM | POA: Insufficient documentation

## 2016-02-23 DIAGNOSIS — Z7982 Long term (current) use of aspirin: Secondary | ICD-10-CM | POA: Insufficient documentation

## 2016-02-23 DIAGNOSIS — J029 Acute pharyngitis, unspecified: Secondary | ICD-10-CM | POA: Insufficient documentation

## 2016-02-23 DIAGNOSIS — J351 Hypertrophy of tonsils: Secondary | ICD-10-CM | POA: Insufficient documentation

## 2016-02-23 DIAGNOSIS — J432 Centrilobular emphysema: Secondary | ICD-10-CM | POA: Insufficient documentation

## 2016-02-23 DIAGNOSIS — Z79899 Other long term (current) drug therapy: Secondary | ICD-10-CM | POA: Insufficient documentation

## 2016-02-23 DIAGNOSIS — R05 Cough: Secondary | ICD-10-CM | POA: Insufficient documentation

## 2016-02-23 DIAGNOSIS — R918 Other nonspecific abnormal finding of lung field: Secondary | ICD-10-CM | POA: Insufficient documentation

## 2016-02-23 DIAGNOSIS — F17210 Nicotine dependence, cigarettes, uncomplicated: Secondary | ICD-10-CM | POA: Insufficient documentation

## 2016-02-23 MED ORDER — FLUTICASONE-SALMETEROL 250-50 MCG/DOSE IN AEPB: 1.0000 | INHALATION_SPRAY | Freq: Two times a day (BID) | RESPIRATORY_TRACT | Status: DC

## 2016-02-23 MED ORDER — METHYLPREDNISOLONE 4 MG PO TBPK: ORAL_TABLET | ORAL | Status: DC

## 2016-02-23 MED ORDER — BENZONATATE 100 MG PO CAPS: 100.0000 mg | ORAL_CAPSULE | Freq: Two times a day (BID) | ORAL | Status: DC | PRN

## 2016-02-23 MED FILL — **ADVAIR 250/50 DISKUS: 250-50 MCG | 14 days supply | Qty: 28 | Fill #0

## 2016-02-23 MED FILL — METHYLPREDNISOLONE 4 MG TAB: 4 | 6 days supply | Qty: 21 | Fill #0

## 2016-02-23 NOTE — Progress Notes (Signed)
Subjective:  Patient ID: Krystal Allen, female    DOB: 03-21-1956  Age: 60 y.o. MRN: [id number]  CC: Cough   HPI TALLIYAH VENTRONE presents for   1. Cough: x 2 weeks. Productive of green mucus. Has hx of chronic bronchitis. Has not had advair for over one year. No CP. Has mid back pain. No fever or chills. Has finished azithromycin prescribed in ED.   CT chest nodule follow up 01/20/2016 IMPRESSION: 1. Multiple tiny 2-4 mm pulmonary nodules scattered throughout the lungs bilaterally are unchanged and strongly favored to be benign. However, there is a spectrum of findings in the lungs that is suggestive of a smoking related disease such as pulmonary Langerhans Cell Histiocytosis. Counseling for smoking cessation is strongly recommended. 2. Mild centrilobular emphysema. 3. Atherosclerosis.  2. Enlarged tonsil: noted on CT neck. Requesting ENT referral. Has throat soreness that has improved since taking azithromycin.   Social History  Substance Use Topics  . Smoking status: Current Every Day Smoker -- 0.50 packs/day    Types: Cigarettes  . Smokeless tobacco: Never Used  . Alcohol Use: 0.0 oz/week     Comment: occasional    Outpatient Prescriptions Prior to Visit  Medication Sig Dispense Refill  . albuterol (PROVENTIL HFA;VENTOLIN HFA) 108 (90 Base) MCG/ACT inhaler Inhale 2 puffs into the lungs every 6 (six) hours as needed for wheezing or shortness of breath. 1 Inhaler 3  . aspirin EC 81 MG tablet Take 1 tablet (81 mg total) by mouth daily. 30 tablet 0  . atorvastatin (LIPITOR) 40 MG tablet Take 1 tablet (40 mg total) by mouth daily. 30 tablet 11  . buPROPion (WELLBUTRIN SR) 150 MG 12 hr tablet Take 150 mg by mouth 2 (two) times daily.  2  . lisinopril (PRINIVIL,ZESTRIL) 20 MG tablet Take 1 tablet (20 mg total) by mouth daily. 30 tablet 5  . naproxen (NAPROSYN) 500 MG tablet Take 1 tablet (500 mg total) by mouth daily with supper. 30 tablet 2  . ranitidine (ZANTAC) 150 MG  tablet Take 1 tablet (150 mg total) by mouth 2 (two) times daily. 60 tablet 5  . furosemide (LASIX) 20 MG tablet Take 1 tablet (20 mg total) by mouth daily. (Patient not taking: Reported on 02/23/2016) 30 tablet 0  . varenicline (CHANTIX CONTINUING MONTH PAK) 1 MG tablet Take 1 tablet (1 mg total) by mouth 2 (two) times daily. (Patient not taking: Reported on 02/16/2016) 60 tablet 2  . varenicline (CHANTIX STARTING MONTH PAK) 0.5 MG X 11 & 1 MG X 42 tablet Taper per packet insert (Patient not taking: Reported on 02/16/2016) 53 tablet 0  . azithromycin (ZITHROMAX) 250 MG tablet Take 1 tablet (250 mg total) by mouth daily. Take first 2 tablets together, then 1 every day until finished. 6 tablet 0   No facility-administered medications prior to visit.    ROS Review of Systems  Constitutional: Negative for fever and chills.  HENT: Positive for sore throat.   Eyes: Negative for visual disturbance.  Respiratory: Positive for cough. Negative for shortness of breath.   Cardiovascular: Negative for chest pain.  Gastrointestinal: Negative for abdominal pain and blood in stool.  Musculoskeletal: Negative for back pain and arthralgias.  Skin: Negative for rash.  Allergic/Immunologic: Negative for immunocompromised state.  Hematological: Negative for adenopathy. Does not bruise/bleed easily.  Psychiatric/Behavioral: Negative for suicidal ideas and dysphoric mood.    Objective:  BP 119/76 mmHg  Pulse 74  Temp(Src) 98.4 F (36.9 C) (Oral)  Resp 16  Ht 5\' 8"  (1.727 m)  Wt 240 lb (108.863 kg)  BMI 36.50 kg/m2  SpO2 97%  BP/Weight 02/23/2016 02/16/2016 123XX123  Systolic BP 123456 0000000 [id number]  Diastolic BP 76 99 81  Wt. (Lbs) 240 - 246  BMI 36.5 - 38.52   Physical Exam  Constitutional: She is oriented to person, place, and time. She appears well-developed and well-nourished. No distress.  HENT:  Head: Normocephalic and atraumatic.  Right Ear: Tympanic membrane, external ear and ear canal normal.    Left Ear: Tympanic membrane, external ear and ear canal normal.  Nose: Mucosal edema (on L ) present.  Mouth/Throat: No oropharyngeal exudate, posterior oropharyngeal edema, posterior oropharyngeal erythema or tonsillar abscesses.    Cardiovascular: Normal rate, regular rhythm, normal heart sounds and intact distal pulses.   Pulmonary/Chest: Effort normal and breath sounds normal.  Musculoskeletal: She exhibits no edema.  Neurological: She is alert and oriented to person, place, and time.  Skin: Skin is warm and dry. No rash noted.  Psychiatric: She has a normal mood and affect.     Assessment & Plan:  Ratasha was seen today for cough.  Diagnoses and all orders for this visit:  COPD bronchitis -     methylPREDNISolone (MEDROL DOSEPAK) 4 MG TBPK tablet; Taper per packet insert -     Fluticasone-Salmeterol (ADVAIR) 250-50 MCG/DOSE AEPB; Inhale 1 puff into the lungs 2 (two) times daily.  Enlarged tonsils -     Ambulatory referral to ENT   There are no diagnoses linked to this encounter.  No orders of the defined types were placed in this encounter.    Follow-up: No Follow-up on file.   Boykin Nearing MD

## 2016-02-23 NOTE — Patient Instructions (Signed)
Krystal Allen was seen today for cough.  Diagnoses and all orders for this visit:  COPD bronchitis -     methylPREDNISolone (MEDROL DOSEPAK) 4 MG TBPK tablet; Taper per packet insert -     Fluticasone-Salmeterol (ADVAIR) 250-50 MCG/DOSE AEPB; Inhale 1 puff into the lungs 2 (two) times daily.  Enlarged tonsils -     Ambulatory referral to ENT   Smoking cessation support: smoking cessation hotline: 1-800-QUIT-NOW.  Smoking cessation classes are available through [facility] and [facility]. Call [phone] or visit our website at [email or web address]   F/u in 4 weeks for cough/chronic bronchitis  Dr. Adrian Blackwater

## 2016-02-23 NOTE — Addendum Note (Signed)
Addended by: Boykin Nearing on: 02/23/2016 05:32 PM   Modules accepted: Orders

## 2016-02-23 NOTE — Assessment & Plan Note (Signed)
Enlarged tonsil ENT referral placed

## 2016-02-23 NOTE — Assessment & Plan Note (Addendum)
Chronic smoker with COPD Acute worsening cough Plan Depo medrol pack Refilled advair  Smoking cessation

## 2016-02-23 NOTE — Progress Notes (Signed)
Productive cough x 2 weeks (green mucus) Requesting ENT referral  Pain scale # 6 Tobacco user 4 cigarette per day  No suicidal thoughts in the past two weeks

## 2016-02-24 MED FILL — LISINOPRIL 20 MG TABLET: 20 | 30 days supply | Qty: 30 | Fill #1

## 2016-02-24 MED FILL — BENZONATATE 100 MG CAPSULE: 100 | 10 days supply | Qty: 20 | Fill #0

## 2016-02-25 ENCOUNTER — Encounter: Payer: Self-pay | Admitting: Family Medicine

## 2016-03-01 ENCOUNTER — Other Ambulatory Visit: Payer: Self-pay | Admitting: *Deleted

## 2016-03-01 DIAGNOSIS — IMO0001 Reserved for inherently not codable concepts without codable children: Secondary | ICD-10-CM

## 2016-03-01 DIAGNOSIS — Z720 Tobacco use: Secondary | ICD-10-CM

## 2016-03-01 MED ORDER — FLUTICASONE-SALMETEROL 250-50 MCG/DOSE IN AEPB: 1.0000 | INHALATION_SPRAY | Freq: Two times a day (BID) | RESPIRATORY_TRACT | Status: DC

## 2016-03-01 MED ORDER — ALBUTEROL SULFATE HFA 108 (90 BASE) MCG/ACT IN AERS: 2.0000 | INHALATION_SPRAY | Freq: Four times a day (QID) | RESPIRATORY_TRACT | Status: DC | PRN

## 2016-03-04 ENCOUNTER — Other Ambulatory Visit: Payer: Self-pay | Admitting: Family Medicine

## 2016-03-22 ENCOUNTER — Ambulatory Visit: Payer: No Typology Code available for payment source | Admitting: Family Medicine

## 2016-04-13 MED FILL — ?LISINOPRIL 20 MG TABLET: 20 | 30 days supply | Qty: 30 | Fill #2

## 2016-04-13 MED FILL — $CHANTIX STARTING MONTH BOX: 0.5 MG X 11 | 28 days supply | Qty: 53 | Fill #0

## 2016-04-21 ENCOUNTER — Ambulatory Visit (INDEPENDENT_AMBULATORY_CARE_PROVIDER_SITE_OTHER): Payer: No Typology Code available for payment source | Admitting: Otolaryngology

## 2016-06-03 MED FILL — ?LISINOPRIL 20 MG TABLET: 20 | 30 days supply | Qty: 30 | Fill #3

## 2016-06-03 MED FILL — ?ATORVASTATIN 40MG TABLET: 40 | 30 days supply | Qty: 30 | Fill #1

## 2016-06-03 MED FILL — NAPROXEN 500 MG TABLET: 500 | 30 days supply | Qty: 30 | Fill #1

## 2016-09-28 MED FILL — LISINOPRIL 20 MG TABLET: 20 | 60 days supply | Qty: 60 | Fill #4

## 2016-11-23 MED FILL — NEO/POLYMYXIN/DEXAMETH DROP: 3.5-10000-0 | 10 days supply | Qty: 5 | Fill #0

## 2017-01-05 ENCOUNTER — Other Ambulatory Visit: Payer: Self-pay | Admitting: Family Medicine

## 2017-01-05 DIAGNOSIS — I10 Essential (primary) hypertension: Secondary | ICD-10-CM

## 2017-01-05 MED FILL — LISINOPRIL 20 MG TABLET: 20 | 30 days supply | Qty: 30 | Fill #0

## 2017-03-07 ENCOUNTER — Other Ambulatory Visit: Payer: Self-pay | Admitting: Family Medicine

## 2017-03-07 DIAGNOSIS — I10 Essential (primary) hypertension: Secondary | ICD-10-CM

## 2017-03-15 MED FILL — LISINOPRIL 20 MG TABLET: 20 | 30 days supply | Qty: 30 | Fill #0

## 2017-04-04 ENCOUNTER — Telehealth: Payer: Self-pay | Admitting: Family Medicine

## 2017-04-04 NOTE — Telephone Encounter (Signed)
I cannot make med recommendations without seeing her since its been so long. She may see a different provider/establish with a new PCP

## 2017-04-04 NOTE — Telephone Encounter (Signed)
Pt called requesting change on her BP medication, patient has not been seen by PCP for over a year. Pt states she has been having headaches. Offered appointment for June 19 but is unable to make it. Please f/up

## 2017-04-04 NOTE — Telephone Encounter (Signed)
Will route to PCP 

## 2017-04-06 ENCOUNTER — Ambulatory Visit: Payer: BLUE CROSS/BLUE SHIELD | Attending: Family Medicine | Admitting: Family Medicine

## 2017-04-06 ENCOUNTER — Encounter: Payer: Self-pay | Admitting: Family Medicine

## 2017-04-06 VITALS — BP 151/74 | HR 65 | Temp 98.1°F | Wt 231.0 lb

## 2017-04-06 DIAGNOSIS — I10 Essential (primary) hypertension: Secondary | ICD-10-CM | POA: Insufficient documentation

## 2017-04-06 DIAGNOSIS — Z7982 Long term (current) use of aspirin: Secondary | ICD-10-CM | POA: Insufficient documentation

## 2017-04-06 DIAGNOSIS — J42 Unspecified chronic bronchitis: Secondary | ICD-10-CM

## 2017-04-06 DIAGNOSIS — E7800 Pure hypercholesterolemia, unspecified: Secondary | ICD-10-CM | POA: Diagnosis not present

## 2017-04-06 DIAGNOSIS — Z720 Tobacco use: Secondary | ICD-10-CM

## 2017-04-06 DIAGNOSIS — Z79899 Other long term (current) drug therapy: Secondary | ICD-10-CM | POA: Insufficient documentation

## 2017-04-06 DIAGNOSIS — F17210 Nicotine dependence, cigarettes, uncomplicated: Secondary | ICD-10-CM | POA: Insufficient documentation

## 2017-04-06 DIAGNOSIS — M79662 Pain in left lower leg: Secondary | ICD-10-CM | POA: Diagnosis not present

## 2017-04-06 DIAGNOSIS — J40 Bronchitis, not specified as acute or chronic: Secondary | ICD-10-CM | POA: Insufficient documentation

## 2017-04-06 LAB — POCT GLYCOSYLATED HEMOGLOBIN (HGB A1C): Hemoglobin A1C: 5.7

## 2017-04-06 MED ORDER — ALBUTEROL SULFATE HFA 108 (90 BASE) MCG/ACT IN AERS: 2.0000 | INHALATION_SPRAY | Freq: Four times a day (QID) | RESPIRATORY_TRACT | 3 refills | Status: DC | PRN

## 2017-04-06 MED ORDER — ACETAMINOPHEN ER 650 MG PO TBCR: 650.0000 mg | EXTENDED_RELEASE_TABLET | Freq: Two times a day (BID) | ORAL | 5 refills | Status: AC | PRN

## 2017-04-06 MED ORDER — ASPIRIN EC 81 MG PO TBEC: 81.0000 mg | DELAYED_RELEASE_TABLET | Freq: Every day | ORAL | 11 refills | Status: DC

## 2017-04-06 MED ORDER — FLUTICASONE-SALMETEROL 250-50 MCG/DOSE IN AEPB: 1.0000 | INHALATION_SPRAY | Freq: Two times a day (BID) | RESPIRATORY_TRACT | 3 refills | Status: DC

## 2017-04-06 MED ORDER — ATORVASTATIN CALCIUM 40 MG PO TABS: 40.0000 mg | ORAL_TABLET | Freq: Every day | ORAL | 11 refills | Status: DC

## 2017-04-06 MED ORDER — AMLODIPINE BESYLATE 5 MG PO TABS: 5.0000 mg | ORAL_TABLET | Freq: Every day | ORAL | 2 refills | Status: DC

## 2017-04-06 MED FILL — AMLODIPINE BESYLATE 5 MG TA: 5 | 30 days supply | Qty: 30 | Fill #0

## 2017-04-06 MED FILL — ATORVASTATIN 40 MG TABLET: 40 | 30 days supply | Qty: 30 | Fill #0

## 2017-04-06 NOTE — Addendum Note (Signed)
Addended by: Boykin Nearing on: 04/06/2017 12:50 PM   Modules accepted: Orders

## 2017-04-06 NOTE — Assessment & Plan Note (Signed)
Left calf pain with walking concerning for claudication No signs of DVT Plan ABI Smoking cessation

## 2017-04-06 NOTE — Patient Instructions (Addendum)
Krystal Allen was seen today for hypertension.  Diagnoses and all orders for this visit:  Essential hypertension -     amLODipine (NORVASC) 5 MG tablet; Take 1 tablet (5 mg total) by mouth daily. -     POCT glycosylated hemoglobin (Hb A1C) -     aspirin EC 81 MG tablet; Take 1 tablet (81 mg total) by mouth daily. -     CMP14+EGFR  Pain of left calf -     VAS [place] ABI WITH/WO TBI; Future  Pure hypercholesterolemia -     Lipid Panel   You will be called with ABI results. This test is to check for poor circulation. Smoking causes poor circulation  Smoking cessation support: smoking cessation hotline: 1-800-QUIT-NOW.  Smoking cessation classes are available through [facility] and [facility]. Call [phone] or visit our website at [email or web address]  F/u in 2 weeks for pharmacist BP check  F/u with me in 6 weeks for HTN  Dr. Adrian Blackwater

## 2017-04-06 NOTE — Progress Notes (Signed)
Subjective:  Patient ID: Krystal Allen, female    DOB: [birth year]  Age: 61 y.o. MRN: [id number]  CC: Hypertension   HPI Krystal Allen has COPD, HTN, HLD, she presents for    1. CHRONIC HYPERTENSION  Disease Monitoring  Blood pressure range: 175/99 at home, elevated for the last month. This is associated with redness in her eyes and headache over the L eye.   Chest pain: no   Dyspnea: no   Claudication: yes, especially the L leg. She also has lower back pains    Medication compliance: yes, lisinopril 20 mg daily.  Medication Side Effects  Lightheadedness: no   Urinary frequency: no   Edema: no    Preventitive Healthcare:  Exercise: yes, walking every day for 15-20 minutes. She is limited by L leg pain    Diet Pattern: eats 2 meals per day and 3-5 snacks per day   Salt Restriction: yes   2. L calf pain: with walking. Also with some low back pain. Pain is relieved by rest. She is smoking 6-7 cigarettes per day. She tried chantix and reports it caused irritable mood. She is not taking daily aspirin.    Social History  Substance Use Topics  . Smoking status: Current Every Day Smoker    Packs/day: 0.50    Types: Cigarettes  . Smokeless tobacco: Never Used  . Alcohol use 0.0 oz/week     Comment: occasional    Outpatient Medications Prior to Visit  Medication Sig Dispense Refill  . albuterol (PROVENTIL HFA;VENTOLIN HFA) 108 (90 Base) MCG/ACT inhaler Inhale 2 puffs into the lungs every 6 (six) hours as needed for wheezing or shortness of breath. 54 g 3  . aspirin EC 81 MG tablet Take 1 tablet (81 mg total) by mouth daily. 30 tablet 0  . atorvastatin (LIPITOR) 40 MG tablet Take 1 tablet (40 mg total) by mouth daily. 30 tablet 11  . Fluticasone-Salmeterol (ADVAIR) 250-50 MCG/DOSE AEPB Inhale 1 puff into the lungs 2 (two) times daily. 180 each 3  . lisinopril (PRINIVIL,ZESTRIL) 20 MG tablet TAKE 1 TABLET BY MOUTH ONCE DAILY 30 tablet 0  . benzonatate (TESSALON) 100 MG  capsule Take 1 capsule (100 mg total) by mouth 2 (two) times daily as needed for cough. (Patient not taking: Reported on 04/06/2017) 20 capsule 0  . buPROPion (WELLBUTRIN SR) 150 MG 12 hr tablet Take 150 mg by mouth 2 (two) times daily.  2  . furosemide (LASIX) 20 MG tablet Take 1 tablet (20 mg total) by mouth daily. (Patient not taking: Reported on 04/06/2017) 30 tablet 0  . methylPREDNISolone (MEDROL DOSEPAK) 4 MG TBPK tablet Taper per packet insert (Patient not taking: Reported on 04/06/2017) 21 tablet 0  . naproxen (NAPROSYN) 500 MG tablet Take 1 tablet (500 mg total) by mouth daily with supper. (Patient not taking: Reported on 04/06/2017) 30 tablet 2  . ranitidine (ZANTAC) 150 MG tablet Take 1 tablet (150 mg total) by mouth 2 (two) times daily. (Patient not taking: Reported on 04/06/2017) 60 tablet 5  . varenicline (CHANTIX CONTINUING MONTH PAK) 1 MG tablet Take 1 tablet (1 mg total) by mouth 2 (two) times daily. (Patient not taking: Reported on 02/16/2016) 60 tablet 2  . varenicline (CHANTIX STARTING MONTH PAK) 0.5 MG X 11 & 1 MG X 42 tablet Taper per packet insert (Patient not taking: Reported on 02/16/2016) 53 tablet 0   No facility-administered medications prior to visit.     ROS Review of Systems  Constitutional: Negative for chills and fever.  Eyes: Negative for visual disturbance.  Respiratory: Negative for shortness of breath.   Cardiovascular: Negative for chest pain.  Gastrointestinal: Negative for abdominal pain and blood in stool.  Musculoskeletal: Positive for myalgias. Negative for arthralgias and back pain.  Skin: Negative for rash.  Allergic/Immunologic: Negative for immunocompromised state.  Hematological: Negative for adenopathy. Does not bruise/bleed easily.  Psychiatric/Behavioral: Negative for dysphoric mood and suicidal ideas.    Objective:  BP (!) 151/74   Pulse 65   Temp 98.1 F (36.7 C) (Oral)   Wt 231 lb (104.8 kg)   SpO2 98%   BMI 35.12 kg/m   BP/Weight  04/06/2017 02/23/2016 01/07/4075  Systolic BP 808 811 031  Diastolic BP 74 76 99  Wt. (Lbs) 231 240 -  BMI 35.12 36.5 -    Physical Exam  Constitutional: She is oriented to person, place, and time. She appears well-developed and well-nourished. No distress.  HENT:  Head: Normocephalic and atraumatic.  Cardiovascular: Normal rate, regular rhythm, normal heart sounds and intact distal pulses.   Pulses:      Dorsalis pedis pulses are 2+ on the right side, and 1+ on the left side.       Posterior tibial pulses are 2+ on the right side, and 1+ on the left side.  Pulmonary/Chest: Effort normal and breath sounds normal.  Musculoskeletal: She exhibits no edema.  Back Exam: Back: Normal Curvature, no deformities or CVA tenderness  Paraspinal Tenderness: absent  LE Strength 5/5  LE Sensation: in tact  LE Reflexes 2+ and symmetric  Straight leg raise: negative    Neurological: She is alert and oriented to person, place, and time.  Skin: Skin is warm and dry. No rash noted.  Psychiatric: She has a normal mood and affect.   Lab Results  Component Value Date   HGBA1C 5.9 01/15/2016     Assessment & Plan:  Krystal Allen was seen today for hypertension.  Diagnoses and all orders for this visit:  Tobacco abuse  Essential hypertension -     amLODipine (NORVASC) 5 MG tablet; Take 1 tablet (5 mg total) by mouth daily. -     POCT glycosylated hemoglobin (Hb A1C) -     aspirin EC 81 MG tablet; Take 1 tablet (81 mg total) by mouth daily. -     CMP14+EGFR  Pain of left calf -     VAS [place] ABI WITH/WO TBI; Future -     acetaminophen (TYLENOL 8 HOUR) 650 MG CR tablet; Take 1 tablet (650 mg total) by mouth 2 (two) times daily as needed for pain.  Pure hypercholesterolemia -     Lipid Panel -     atorvastatin (LIPITOR) 40 MG tablet; Take 1 tablet (40 mg total) by mouth daily.  Chronic bronchitis, unspecified chronic bronchitis type (HCC) -     Fluticasone-Salmeterol (ADVAIR) 250-50 MCG/DOSE AEPB;  Inhale 1 puff into the lungs 2 (two) times daily. -     albuterol (PROVENTIL HFA;VENTOLIN HFA) 108 (90 Base) MCG/ACT inhaler; Inhale 2 puffs into the lungs every 6 (six) hours as needed for wheezing or shortness of breath.   There are no diagnoses linked to this encounter.  No orders of the defined types were placed in this encounter.   Follow-up: Return in about 2 weeks (around 04/20/2017) for BP check.   Boykin Nearing MD

## 2017-04-06 NOTE — Progress Notes (Signed)
Pt wants to discuss BP medication.

## 2017-04-06 NOTE — Assessment & Plan Note (Signed)
Uncontrolled on lisinopril 20 mg daily Plan: Change to Norvasc 5 mg daily

## 2017-04-07 LAB — LIPID PANEL
CHOL/HDL RATIO: 4.6 ratio — AB (ref 0.0–4.4)
Cholesterol, Total: 213 mg/dL — ABNORMAL HIGH (ref 100–199)
HDL: 46 mg/dL (ref >39)
LDL Calculated: 149 mg/dL — ABNORMAL HIGH (ref 0–99)
Triglycerides: 91 mg/dL (ref 0–149)
VLDL CHOLESTEROL CAL: 18 mg/dL (ref 5–40)

## 2017-04-07 LAB — CMP14+EGFR
A/G RATIO: 1.4 (ref 1.2–2.2)
ALBUMIN: 4.1 g/dL (ref 3.6–4.8)
ALT: 13 IU/L (ref 0–32)
AST: 19 IU/L (ref 0–40)
Alkaline Phosphatase: 111 IU/L (ref 39–117)
BUN / CREAT RATIO: 11 — AB (ref 12–28)
BUN: 13 mg/dL (ref 8–27)
Bilirubin Total: <0.2 mg/dL (ref 0.0–1.2)
CALCIUM: 9.7 mg/dL (ref 8.7–10.3)
CO2: 25 mmol/L (ref 18–29)
Chloride: 108 mmol/L — ABNORMAL HIGH (ref 96–106)
Creatinine, Ser: 1.16 mg/dL — ABNORMAL HIGH (ref 0.57–1.00)
GFR, EST AFRICAN AMERICAN: 59 mL/min/{1.73_m2} — AB (ref >59)
GFR, EST NON AFRICAN AMERICAN: 51 mL/min/{1.73_m2} — AB (ref >59)
GLOBULIN, TOTAL: 2.9 g/dL (ref 1.5–4.5)
Glucose: 82 mg/dL (ref 65–99)
POTASSIUM: 4.3 mmol/L (ref 3.5–5.2)
Sodium: 146 mmol/L — ABNORMAL HIGH (ref 134–144)
TOTAL PROTEIN: 7 g/dL (ref 6.0–8.5)

## 2017-04-10 ENCOUNTER — Ambulatory Visit (HOSPITAL_COMMUNITY)
Admission: RE | Admit: 2017-04-10 | Discharge: 2017-04-10 | Disposition: A | Discharge status: Home / Self Care | Payer: BLUE CROSS/BLUE SHIELD | Source: Ambulatory Visit | Attending: Family Medicine | Admitting: Family Medicine

## 2017-04-10 DIAGNOSIS — M79662 Pain in left lower leg: Secondary | ICD-10-CM | POA: Diagnosis present

## 2017-04-10 NOTE — Progress Notes (Signed)
VASCULAR LAB PRELIMINARY  ARTERIAL  ABI completed:    RIGHT    LEFT    PRESSURE WAVEFORM  PRESSURE WAVEFORM  BRACHIAL 171 Triphasic BRACHIAL 188 Triphasic  DP 191 Triphasic DP 170 Triphasic  PT 195 Triphasic PT 193 Triphasic    RIGHT LEFT  ABI 1.04 1.03   ABIs and Doppler waveforms indicate normal arterial flow bilaterally  at rest.  Krystal Allen, RVS 04/10/2017, 12:21 PM

## 2017-04-13 ENCOUNTER — Encounter (HOSPITAL_COMMUNITY): Payer: Self-pay | Admitting: Emergency Medicine

## 2017-04-13 ENCOUNTER — Emergency Department (HOSPITAL_COMMUNITY)
Admission: EM | Admit: 2017-04-13 | Discharge: 2017-04-13 | Disposition: A | Discharge status: Home / Self Care | Payer: BLUE CROSS/BLUE SHIELD | Attending: Emergency Medicine | Admitting: Emergency Medicine

## 2017-04-13 ENCOUNTER — Emergency Department (HOSPITAL_COMMUNITY): Payer: BLUE CROSS/BLUE SHIELD

## 2017-04-13 DIAGNOSIS — Z7982 Long term (current) use of aspirin: Secondary | ICD-10-CM | POA: Diagnosis not present

## 2017-04-13 DIAGNOSIS — J449 Chronic obstructive pulmonary disease, unspecified: Secondary | ICD-10-CM | POA: Diagnosis not present

## 2017-04-13 DIAGNOSIS — I10 Essential (primary) hypertension: Secondary | ICD-10-CM | POA: Diagnosis not present

## 2017-04-13 DIAGNOSIS — M5412 Radiculopathy, cervical region: Secondary | ICD-10-CM | POA: Insufficient documentation

## 2017-04-13 DIAGNOSIS — R791 Abnormal coagulation profile: Secondary | ICD-10-CM | POA: Diagnosis not present

## 2017-04-13 DIAGNOSIS — Z91040 Latex allergy status: Secondary | ICD-10-CM | POA: Diagnosis not present

## 2017-04-13 DIAGNOSIS — F17210 Nicotine dependence, cigarettes, uncomplicated: Secondary | ICD-10-CM | POA: Insufficient documentation

## 2017-04-13 DIAGNOSIS — R200 Anesthesia of skin: Secondary | ICD-10-CM | POA: Diagnosis present

## 2017-04-13 LAB — COMPREHENSIVE METABOLIC PANEL
ALT: 21 U/L (ref 14–54)
AST: 21 U/L (ref 15–41)
Albumin: 3.7 g/dL (ref 3.5–5.0)
Alkaline Phosphatase: 95 U/L (ref 38–126)
Anion gap: 9 (ref 5–15)
BUN: 19 mg/dL (ref 6–20)
CHLORIDE: 109 mmol/L (ref 101–111)
CO2: 24 mmol/L (ref 22–32)
Calcium: 9.1 mg/dL (ref 8.9–10.3)
Creatinine, Ser: 1.12 mg/dL — ABNORMAL HIGH (ref 0.44–1.00)
GFR, EST NON AFRICAN AMERICAN: 52 mL/min — AB (ref >60)
Glucose, Bld: 119 mg/dL — ABNORMAL HIGH (ref 65–99)
POTASSIUM: 3.8 mmol/L (ref 3.5–5.1)
Sodium: 142 mmol/L (ref 135–145)
Total Bilirubin: 0.2 mg/dL — ABNORMAL LOW (ref 0.3–1.2)
Total Protein: 7.4 g/dL (ref 6.5–8.1)

## 2017-04-13 LAB — I-STAT CHEM 8, ED
BUN: 21 mg/dL — ABNORMAL HIGH (ref 6–20)
Calcium, Ion: 1.11 mmol/L — ABNORMAL LOW (ref 1.15–1.40)
Chloride: 108 mmol/L (ref 101–111)
Creatinine, Ser: 1 mg/dL (ref 0.44–1.00)
Glucose, Bld: 114 mg/dL — ABNORMAL HIGH (ref 65–99)
HEMATOCRIT: 43 % (ref 36.0–46.0)
HEMOGLOBIN: 14.6 g/dL (ref 12.0–15.0)
Potassium: 4 mmol/L (ref 3.5–5.1)
SODIUM: 143 mmol/L (ref 135–145)
TCO2: 25 mmol/L (ref 0–100)

## 2017-04-13 LAB — DIFFERENTIAL
BASOS ABS: 0 10*3/uL (ref 0.0–0.1)
BASOS PCT: 1 %
EOS ABS: 0.2 10*3/uL (ref 0.0–0.7)
Eosinophils Relative: 3 %
Lymphocytes Relative: 41 %
Lymphs Abs: 2.8 10*3/uL (ref 0.7–4.0)
MONO ABS: 0.4 10*3/uL (ref 0.1–1.0)
MONOS PCT: 6 %
Neutro Abs: 3.4 10*3/uL (ref 1.7–7.7)
Neutrophils Relative %: 49 %

## 2017-04-13 LAB — CBC
HEMATOCRIT: 41.5 % (ref 36.0–46.0)
HEMOGLOBIN: 14.1 g/dL (ref 12.0–15.0)
MCH: 30.2 pg (ref 26.0–34.0)
MCHC: 34 g/dL (ref 30.0–36.0)
MCV: 88.9 fL (ref 78.0–100.0)
PLATELETS: 236 10*3/uL (ref 150–400)
RBC: 4.67 MIL/uL (ref 3.87–5.11)
RDW: 16.6 % — AB (ref 11.5–15.5)
WBC: 6.9 10*3/uL (ref 4.0–10.5)

## 2017-04-13 LAB — I-STAT TROPONIN, ED: TROPONIN I, POC: 0 ng/mL (ref 0.00–0.08)

## 2017-04-13 LAB — PROTIME-INR
INR: 0.99
Prothrombin Time: 13.1 seconds (ref 11.4–15.2)

## 2017-04-13 LAB — CBG MONITORING, ED: GLUCOSE-CAPILLARY: 120 mg/dL — AB (ref 65–99)

## 2017-04-13 LAB — APTT: aPTT: 31 seconds (ref 24–36)

## 2017-04-13 NOTE — ED Notes (Signed)
Patient reports sensation on left arm and leg isnt as strong as when being touch on right side.

## 2017-04-13 NOTE — ED Triage Notes (Signed)
Patient reports left arm tingling that started around 150p today while sitting down doing nothing.  Patient states that she recently had her BP medications adjusted and she doesn't think they are working know.

## 2017-04-13 NOTE — ED Notes (Signed)
Pt returned from MRI °

## 2017-04-13 NOTE — ED Notes (Signed)
Patient transported to MRI 

## 2017-04-13 NOTE — ED Provider Notes (Signed)
[name] DEPT Provider Note   CSN: [id number] Arrival date & time: 04/13/17  1410     History   Chief Complaint Chief Complaint  Patient presents with  . tingling in left arm    HPI Krystal Allen is a 61 y.o. female.Complains of numbness in left arm and hand originating from p.m. today symptoms accompanied by blurred vision onset 1:50 PM today symptoms are intermittent lasting for 5 minutes at a time goes away for 1 minute of time no weakness no other associated symptoms no treatment prior to coming here blurred vision is in both eyes, mild thing makes symptoms better or worse times. No treatment prior to coming here no difficulty with gait no difficulty with movement no weakness no difficulty with speech HPI . Past Medical History:  Diagnosis Date  . COPD (chronic obstructive pulmonary disease) ([place])   . Depression   . GERD (gastroesophageal reflux disease)   . Hernia, abdominal   . History of chicken pox   . Hyperlipidemia   . Hypertension   . Internal hemorrhoids with Grade 3 prolapse and bleeding 06/19/2013  . Osteoarthritis   . Personal history of colonic adenomas 06/25/2013    Patient Active Problem List   Diagnosis Date Noted  . Pain of left calf 04/06/2017  . COPD (chronic obstructive pulmonary disease) ([place]) 02/23/2016  . Enlarged tonsils 02/23/2016  . Obesity (BMI 30-39.9) 01/15/2016  . Bilateral shoulder pain 01/15/2016  . Esophageal reflux 01/15/2016  . Tobacco abuse 05/13/2015  . Lung nodule 08/14/2014  . Impaired fasting glucose 08/14/2014  . Hyperlipidemia 08/14/2014  . Personal history of colonic adenomas 06/25/2013  . HTN (hypertension) 05/20/2013    Past Surgical History:  Procedure Laterality Date  . ABDOMINAL HYSTERECTOMY  15yrs ago   due to heavy bleeding and fibroids   . COLONOSCOPY    . HEMORRHOID BANDING  2014  . HEMORRHOID SURGERY  50yrs agao  . UMBILICAL HERNIA REPAIR  06/2015    OB History    No data available       Home  Medications    Prior to Admission medications   Medication Sig Start Date End Date Taking? Authorizing Provider  acetaminophen (TYLENOL 8 HOUR) 650 MG CR tablet Take 1 tablet (650 mg total) by mouth 2 (two) times daily as needed for pain. 04/06/17  Yes Funches, Josalyn, MD  albuterol (PROVENTIL HFA;VENTOLIN HFA) 108 (90 Base) MCG/ACT inhaler Inhale 2 puffs into the lungs every 6 (six) hours as needed for wheezing or shortness of breath. 04/06/17  Yes Funches, Josalyn, MD  amLODipine (NORVASC) 5 MG tablet Take 1 tablet (5 mg total) by mouth daily. 04/06/17  Yes Funches, Adriana Mccallum, MD  aspirin EC 81 MG tablet Take 1 tablet (81 mg total) by mouth daily. 04/06/17  Yes Funches, Josalyn, MD  Fluticasone-Salmeterol (ADVAIR) 250-50 MCG/DOSE AEPB Inhale 1 puff into the lungs 2 (two) times daily. 04/06/17  Yes Funches, Josalyn, MD  atorvastatin (LIPITOR) 40 MG tablet Take 1 tablet (40 mg total) by mouth daily. Patient not taking: Reported on 04/13/2017 04/06/17   Boykin Nearing, MD    Family History Family History  Problem Relation Age of Onset  . Cancer Father        prostate  . Hypertension Mother   . Diabetes Mother   . Hypertension Sister   . Heart disease Maternal Uncle   . Stroke Maternal Grandmother   . Hypertension Maternal Grandmother   . Hypertension Sister   . Colon cancer Neg Hx  Social History Social History  Substance Use Topics  . Smoking status: Current Every Day Smoker    Packs/day: 0.50    Types: Cigarettes  . Smokeless tobacco: Never Used  . Alcohol use 0.0 oz/week     Comment: occasional     Allergies   Sulfa antibiotics; Fish allergy; Penicillins; and Latex   Review of Systems Review of Systems  Constitutional: Negative.   HENT: Positive for voice change.   Respiratory: Negative.   Cardiovascular: Negative.   Gastrointestinal: Negative.   Musculoskeletal: Negative.   Skin: Negative.   Neurological: Positive for numbness.  Psychiatric/Behavioral: Negative.   All  other systems reviewed and are negative.    Physical Exam Updated Vital Signs BP (!) 171/95   Pulse 71   Temp 97.7 F (36.5 C)   Resp 16   Ht 5\' 7"  (1.702 m)   Wt 104.8 kg (231 lb)   SpO2 97%   BMI 36.18 kg/m   Physical Exam  Constitutional: She is oriented to person, place, and time. She appears well-developed and well-nourished. No distress.  HENT:  Head: Normocephalic and atraumatic.  No facila assymmetry, no bruit  Eyes: Conjunctivae are normal. Pupils are equal, round, and reactive to light.  Neck: Neck supple. No tracheal deviation present. No thyromegaly present.  Cardiovascular: Normal rate and regular rhythm.   No murmur heard. Pulmonary/Chest: Effort normal and breath sounds normal.  Abdominal: Soft. Bowel sounds are normal. She exhibits no distension. There is no tenderness.  Musculoskeletal: Normal range of motion. She exhibits no edema or tenderness.  Neurological: She is alert and oriented to person, place, and time. No cranial nerve deficit. Coordination normal.  Gait normal Romberg normal pronator drift normal finger to nose normal DTR symmetric bilaterally at knee jerk ankle jerk and biceps toes downward going bilaterally  Skin: Skin is warm and dry. No rash noted.  Psychiatric: She has a normal mood and affect.  Nursing note and vitals reviewed.    ED Treatments / Results  Labs (all labs ordered are listed, but only abnormal results are displayed) Labs Reviewed  CBC - Abnormal; Notable for the following:       Result Value   RDW 16.6 (*)    All other components within normal limits  CBG MONITORING, ED - Abnormal; Notable for the following:    Glucose-Capillary 120 (*)    All other components within normal limits  I-STAT CHEM 8, ED - Abnormal; Notable for the following:    BUN 21 (*)    Glucose, Bld 114 (*)    Calcium, Ion 1.11 (*)    All other components within normal limits  DIFFERENTIAL  PROTIME-INR  APTT  COMPREHENSIVE METABOLIC PANEL    I-STAT TROPOININ, ED    EKG  EKG Interpretation  Date/Time:  Thursday April 13 2017 14:18:25 EDT Ventricular Rate:  70 PR Interval:    QRS Duration: 98 QT Interval:  381 QTC Calculation: 412 R Axis:   83 Text Interpretation:  Sinus rhythm Left atrial enlargement Borderline right axis deviation Borderline low voltage, extremity leads Nonspecific T abnrm, anterolateral leads No STEMI.  Confirmed by Nanda Quinton [phone]) on 04/13/2017 2:22:36 PM Also confirmed by Nanda Quinton [phone]), editor Laurena Spies [phone])  on 04/13/2017 2:41:26 PM      Results for orders placed or performed during the hospital encounter of 04/13/17  Protime-INR  Result Value Ref Range   Prothrombin Time 13.1 11.4 - 15.2 seconds   INR 0.99   APTT  Result Value Ref Range   aPTT 31 24 - 36 seconds  CBC  Result Value Ref Range   WBC 6.9 4.0 - 10.5 K/uL   RBC 4.67 3.87 - 5.11 MIL/uL   Hemoglobin 14.1 12.0 - 15.0 g/dL   HCT 41.5 36.0 - 46.0 %   MCV 88.9 78.0 - 100.0 fL   MCH 30.2 26.0 - 34.0 pg   MCHC 34.0 30.0 - 36.0 g/dL   RDW 16.6 (H) 11.5 - 15.5 %   Platelets 236 150 - 400 K/uL  Differential  Result Value Ref Range   Neutrophils Relative % 49 %   Neutro Abs 3.4 1.7 - 7.7 K/uL   Lymphocytes Relative 41 %   Lymphs Abs 2.8 0.7 - 4.0 K/uL   Monocytes Relative 6 %   Monocytes Absolute 0.4 0.1 - 1.0 K/uL   Eosinophils Relative 3 %   Eosinophils Absolute 0.2 0.0 - 0.7 K/uL   Basophils Relative 1 %   Basophils Absolute 0.0 0.0 - 0.1 K/uL  Comprehensive metabolic panel  Result Value Ref Range   Sodium 142 135 - 145 mmol/L   Potassium 3.8 3.5 - 5.1 mmol/L   Chloride 109 101 - 111 mmol/L   CO2 24 22 - 32 mmol/L   Glucose, Bld 119 (H) 65 - 99 mg/dL   BUN 19 6 - 20 mg/dL   Creatinine, Ser 1.12 (H) 0.44 - 1.00 mg/dL   Calcium 9.1 8.9 - 10.3 mg/dL   Total Protein 7.4 6.5 - 8.1 g/dL   Albumin 3.7 3.5 - 5.0 g/dL   AST 21 15 - 41 U/L   ALT 21 14 - 54 U/L   Alkaline Phosphatase 95 38 - 126 U/L   Total  Bilirubin 0.2 (L) 0.3 - 1.2 mg/dL   GFR calc non Af Amer 52 (L) >60 mL/min   GFR calc Af Amer >60 >60 mL/min   Anion gap 9 5 - 15  I-stat troponin, ED  Result Value Ref Range   Troponin i, poc 0.00 0.00 - 0.08 ng/mL   Comment 3          CBG monitoring, ED  Result Value Ref Range   Glucose-Capillary 120 (H) 65 - 99 mg/dL  I-Stat Chem 8, ED  Result Value Ref Range   Sodium 143 135 - 145 mmol/L   Potassium 4.0 3.5 - 5.1 mmol/L   Chloride 108 101 - 111 mmol/L   BUN 21 (H) 6 - 20 mg/dL   Creatinine, Ser 1.00 0.44 - 1.00 mg/dL   Glucose, Bld 114 (H) 65 - 99 mg/dL   Calcium, Ion 1.11 (L) 1.15 - 1.40 mmol/L   TCO2 25 0 - 100 mmol/L   Hemoglobin 14.6 12.0 - 15.0 g/dL   HCT 43.0 36.0 - 46.0 %   Ct Head Wo Contrast  Result Date: 04/13/2017 CLINICAL DATA:  Onset left arm tingling at 1:50 p.m. today. EXAM: CT HEAD WITHOUT CONTRAST TECHNIQUE: Contiguous axial images were obtained from the base of the skull through the vertex without intravenous contrast. COMPARISON:  Head CT scan 07/23/2012. FINDINGS: Brain: Appears normal without hemorrhage, infarct, mass lesion, mass effect, midline shift or abnormal extra-axial fluid collection. No hydrocephalus or pneumocephalus. Vascular: Negative. Skull: Intact. Sinuses/Orbits: Negative. Other: None. IMPRESSION: Negative head CT. Electronically Signed   By: Inge Rise M.D.   On: 04/13/2017 14:54   Mr Brain Wo Contrast  Result Date: 04/13/2017 CLINICAL DATA:  Initial evaluation for left arm numbness, blurry vision. EXAM: MRI HEAD  WITHOUT CONTRAST MRI CERVICAL SPINE WITHOUT CONTRAST TECHNIQUE: Multiplanar, multiecho pulse sequences of the brain and surrounding structures, and cervical spine, to include the craniocervical junction and cervicothoracic junction, were obtained without intravenous contrast. COMPARISON:  Prior CT from earlier the same day. FINDINGS: MRI HEAD FINDINGS Brain: Cerebral volume within normal limits. No significant cerebral white  matter disease for age. No abnormal foci of restricted diffusion to suggest acute or subacute ischemia. Gray-white matter differentiation maintained. No evidence for acute or chronic intracranial hemorrhage. No areas of chronic infarction identified. No mass lesion, midline shift or mass effect. No hydrocephalus. No extra-axial fluid collection. Major dural sinuses are patent. Pituitary gland suprasellar region within normal limits. Midline structures intact and normal. Vascular: Major intracranial vascular flow voids maintained. Skull and upper cervical spine: Craniocervical junction within normal limits. Bone marrow signal intensity normal. No scalp soft tissue abnormality. Sinuses/Orbits: Globes and orbital soft tissues within normal limits. Mild scattered mucosal thickening throughout the paranasal sinuses. No air-fluid level to suggest active sinus infection. No mastoid effusion. Inner ear structures normal. MRI CERVICAL SPINE FINDINGS Alignment: Straightening of the normal cervical lordosis. Trace anterolisthesis of C3 on C4 and C4 on C5. Vertebrae: Vertebral body heights maintained. No evidence for acute or chronic fracture. Signal intensity within the vertebral body bone marrow normal. Small benign hemangioma noted within C5 vertebral body. No worrisome osseous lesions. No abnormal marrow edema. Cord: Signal intensity within the cervical spinal cord is normal. Posterior Fossa, vertebral arteries, paraspinal tissues: Paraspinous soft tissues within normal limits. Normal intravascular flow voids present within the vertebral arteries bilaterally. Disc levels: C2-C3: Unremarkable. C3-C4: Shallow posterior disc protrusion mildly indents the ventral thecal sac. No significant canal stenosis. Left-sided uncovertebral spurring. Moderate left C4 foraminal stenosis. No significant right foraminal encroachment. C4-C5: Mild diffuse disc bulge. Right worse than left uncovertebral disease. Severe right with moderate left  C5 foraminal stenosis. C5-C6: Diffuse degenerative disc bulge with intervertebral disc space narrowing. Prominent bilateral uncovertebral spurring. Bulging disc mildly flattens the ventral thecal sac without significant canal stenosis. Moderate bilateral C6 foraminal stenosis, right worse than left. C6-C7: Diffuse degenerative disc bulge with intervertebral disc space narrowing. Bulging disc flattens and partially indents the ventral thecal sac without significant canal stenosis. Moderate bilateral C7 foraminal stenosis, slightly worse on the left. C7-T1:  Unremarkable. Visualized upper thoracic spine within normal limits. IMPRESSION: 1. Normal brain MRI.  No acute intracranial process identified. 2. Moderate multilevel degenerative spondylolysis extending from C3-4 through C6-7 without significant canal stenosis. 3. Multifactorial degenerative changes with resultant multilevel foraminal narrowing as above. Most notable changes include moderate left C4 foraminal stenosis, severe right with moderate left C5 foraminal narrowing, with moderate bilateral C6 and C7 foraminal stenosis. Electronically Signed   By: Jeannine Boga M.D.   On: 04/13/2017 18:55   Mr Cervical Spine Wo Contrast  Result Date: 04/13/2017 CLINICAL DATA:  Initial evaluation for left arm numbness, blurry vision. EXAM: MRI HEAD WITHOUT CONTRAST MRI CERVICAL SPINE WITHOUT CONTRAST TECHNIQUE: Multiplanar, multiecho pulse sequences of the brain and surrounding structures, and cervical spine, to include the craniocervical junction and cervicothoracic junction, were obtained without intravenous contrast. COMPARISON:  Prior CT from earlier the same day. FINDINGS: MRI HEAD FINDINGS Brain: Cerebral volume within normal limits. No significant cerebral white matter disease for age. No abnormal foci of restricted diffusion to suggest acute or subacute ischemia. Gray-white matter differentiation maintained. No evidence for acute or chronic intracranial  hemorrhage. No areas of chronic infarction identified. No mass lesion, midline shift  or mass effect. No hydrocephalus. No extra-axial fluid collection. Major dural sinuses are patent. Pituitary gland suprasellar region within normal limits. Midline structures intact and normal. Vascular: Major intracranial vascular flow voids maintained. Skull and upper cervical spine: Craniocervical junction within normal limits. Bone marrow signal intensity normal. No scalp soft tissue abnormality. Sinuses/Orbits: Globes and orbital soft tissues within normal limits. Mild scattered mucosal thickening throughout the paranasal sinuses. No air-fluid level to suggest active sinus infection. No mastoid effusion. Inner ear structures normal. MRI CERVICAL SPINE FINDINGS Alignment: Straightening of the normal cervical lordosis. Trace anterolisthesis of C3 on C4 and C4 on C5. Vertebrae: Vertebral body heights maintained. No evidence for acute or chronic fracture. Signal intensity within the vertebral body bone marrow normal. Small benign hemangioma noted within C5 vertebral body. No worrisome osseous lesions. No abnormal marrow edema. Cord: Signal intensity within the cervical spinal cord is normal. Posterior Fossa, vertebral arteries, paraspinal tissues: Paraspinous soft tissues within normal limits. Normal intravascular flow voids present within the vertebral arteries bilaterally. Disc levels: C2-C3: Unremarkable. C3-C4: Shallow posterior disc protrusion mildly indents the ventral thecal sac. No significant canal stenosis. Left-sided uncovertebral spurring. Moderate left C4 foraminal stenosis. No significant right foraminal encroachment. C4-C5: Mild diffuse disc bulge. Right worse than left uncovertebral disease. Severe right with moderate left C5 foraminal stenosis. C5-C6: Diffuse degenerative disc bulge with intervertebral disc space narrowing. Prominent bilateral uncovertebral spurring. Bulging disc mildly flattens the ventral thecal  sac without significant canal stenosis. Moderate bilateral C6 foraminal stenosis, right worse than left. C6-C7: Diffuse degenerative disc bulge with intervertebral disc space narrowing. Bulging disc flattens and partially indents the ventral thecal sac without significant canal stenosis. Moderate bilateral C7 foraminal stenosis, slightly worse on the left. C7-T1:  Unremarkable. Visualized upper thoracic spine within normal limits. IMPRESSION: 1. Normal brain MRI.  No acute intracranial process identified. 2. Moderate multilevel degenerative spondylolysis extending from C3-4 through C6-7 without significant canal stenosis. 3. Multifactorial degenerative changes with resultant multilevel foraminal narrowing as above. Most notable changes include moderate left C4 foraminal stenosis, severe right with moderate left C5 foraminal narrowing, with moderate bilateral C6 and C7 foraminal stenosis. Electronically Signed   By: Jeannine Boga M.D.   On: 04/13/2017 18:55    Radiology Ct Head Wo Contrast  Result Date: 04/13/2017 CLINICAL DATA:  Onset left arm tingling at 1:50 p.m. today. EXAM: CT HEAD WITHOUT CONTRAST TECHNIQUE: Contiguous axial images were obtained from the base of the skull through the vertex without intravenous contrast. COMPARISON:  Head CT scan 07/23/2012. FINDINGS: Brain: Appears normal without hemorrhage, infarct, mass lesion, mass effect, midline shift or abnormal extra-axial fluid collection. No hydrocephalus or pneumocephalus. Vascular: Negative. Skull: Intact. Sinuses/Orbits: Negative. Other: None. IMPRESSION: Negative head CT. Electronically Signed   By: Inge Rise M.D.   On: 04/13/2017 14:54    Procedures Procedures (including critical care time)  Medications Ordered in ED Medications - No data to display   Initial Impression / Assessment and Plan / ED Course  I have reviewed the triage vital signs and the nursing notes.  Pertinent labs & imaging results that were  available during my care of the patient were reviewed by me and considered in my medical decision making (see chart for details).     7:20 PM patient reports her vision is now normal. Paresthesias much improved. Strongly doubt stroke or TIA given symptoms with normal exam. She does have some. Symptoms consistent with cervical radiculopathy. Given neck pain. Plan we'll refer her back to her  primary care physician. She is in agreement. Blood pressure recheck one week. She hasn't plan with her primary care physician scheduled for 04/19/2017. I counseled patient for 5 minutes on smoking cessation. Suggest Tylenol for pain. Final Clinical Impressions(s) / ED Diagnoses  Diagnoses #1 cervical radiculopathy #2 tobacco abuse Final diagnoses:  None  #3 elevated blood pressure  New Prescriptions New Prescriptions   No medications on file     Orlie Dakin, MD 04/13/17 1932

## 2017-04-13 NOTE — Discharge Instructions (Signed)
Take Tylenol as directed for pain. Keep your scheduled appointment with your primary care physician on 04/19/2017. Ask your Dr. to help you to stop smoking. Get your blood pressure recheck that your next office visit. Today's was elevated at 164/96

## 2017-04-20 ENCOUNTER — Ambulatory Visit: Payer: BLUE CROSS/BLUE SHIELD | Attending: Family Medicine | Admitting: Pharmacist

## 2017-04-20 ENCOUNTER — Telehealth: Payer: Self-pay

## 2017-04-20 DIAGNOSIS — Z79899 Other long term (current) drug therapy: Secondary | ICD-10-CM | POA: Insufficient documentation

## 2017-04-20 DIAGNOSIS — I10 Essential (primary) hypertension: Secondary | ICD-10-CM | POA: Insufficient documentation

## 2017-04-20 MED ORDER — AMLODIPINE BESYLATE 10 MG PO TABS: 10.0000 mg | ORAL_TABLET | Freq: Every day | ORAL | 2 refills | Status: DC

## 2017-04-20 MED FILL — AMLODIPINE BESYLATE 10 MG T: 10 | 30 days supply | Qty: 30 | Fill #0

## 2017-04-20 NOTE — Progress Notes (Signed)
   S:    Patient arrives in good spirits with her grandson.    Presents to the clinic for hypertension evaluation. Patient was referred on 04/13/17.  Patient was last seen by Primary Care Provider on 04/13/17.   Patient reports adherence with medications.  Current BP Medications include: amlodipine 5 mg daily.  Antihypertensives tried in the past include: lisinopril (patient didn't get to goal)  Reports monitoring blood pressure at home: 165/90, 154/95  Reports arm pain - evaluated by ED a few days ago.  O:   Last 3 Office BP readings: BP Readings from Last 3 Encounters:  04/20/17 (!) 167/100  04/13/17 (!) 164/96  04/06/17 (!) 151/74    BMET    Component Value Date/Time   NA 143 04/13/2017 1447   NA 146 (H) 04/06/2017 1152   K 4.0 04/13/2017 1447   CL 108 04/13/2017 1447   CO2 24 04/13/2017 1437   GLUCOSE 114 (H) 04/13/2017 1447   BUN 21 (H) 04/13/2017 1447   BUN 13 04/06/2017 1152   CREATININE 1.00 04/13/2017 1447   CREATININE 1.22 (H) 01/15/2016 1000   CALCIUM 9.1 04/13/2017 1437   GFRNONAA 52 (L) 04/13/2017 1437   GFRNONAA 48 (L) 01/15/2016 1000   GFRAA >60 04/13/2017 1437   GFRAA 56 (L) 01/15/2016 1000    A/P: Hypertension longstanding  currently UNcontrolled on current medications.  Increased dose of amlodipine to 10 mg daily. Will likely need additional agent. Will follow up with me in 1 week for blood pressure check unless she can get in to see Dr. Adrian Blackwater for pain.   Results reviewed and written information provided.   Total time in face-to-face counseling 10 minutes.   F/U Clinic Visit with Dr. Adrian Blackwater for pain.  Patient seen with Maple Mirza, PharmD Candidate and Mechele Claude, PharmD Candidate

## 2017-04-20 NOTE — Patient Instructions (Signed)
Thanks for coming to see Krystal Allen  Increase amlodipine to 10 mg daily  Follow up in 1 week for blood pressure check  Make appt with Dr. Adrian Blackwater for pain   DASH Eating Plan DASH stands for "Dietary Approaches to Stop Hypertension." The DASH eating plan is a healthy eating plan that has been shown to reduce high blood pressure (hypertension). It may also reduce your risk for type 2 diabetes, heart disease, and stroke. The DASH eating plan may also help with weight loss. What are tips for following this plan? General guidelines  Avoid eating more than 2,300 mg (milligrams) of salt (sodium) a day. If you have hypertension, you may need to reduce your sodium intake to 1,500 mg a day.  Limit alcohol intake to no more than 1 drink a day for nonpregnant women and 2 drinks a day for men. One drink equals 12 oz of beer, 5 oz of wine, or 1 oz of hard liquor.  Work with your health care provider to maintain a healthy body weight or to lose weight. Ask what an ideal weight is for you.  Get at least 30 minutes of exercise that causes your heart to beat faster (aerobic exercise) most days of the week. Activities may include walking, swimming, or biking.  Work with your health care provider or diet and nutrition specialist (dietitian) to adjust your eating plan to your individual calorie needs. Reading food labels  Check food labels for the amount of sodium per serving. Choose foods with less than 5 percent of the Daily Value of sodium. Generally, foods with less than 300 mg of sodium per serving fit into this eating plan.  To find whole grains, look for the word "whole" as the first word in the ingredient list. Shopping  Buy products labeled as "low-sodium" or "no salt added."  Buy fresh foods. Avoid canned foods and premade or frozen meals. Cooking  Avoid adding salt when cooking. Use salt-free seasonings or herbs instead of table salt or sea salt. Check with your health care provider or pharmacist  before using salt substitutes.  Do not fry foods. Cook foods using healthy methods such as baking, boiling, grilling, and broiling instead.  Cook with heart-healthy oils, such as olive, canola, soybean, or sunflower oil. Meal planning   Eat a balanced diet that includes: ? 5 or more servings of fruits and vegetables each day. At each meal, try to fill half of your plate with fruits and vegetables. ? Up to 6-8 servings of whole grains each day. ? Less than 6 oz of lean meat, poultry, or fish each day. A 3-oz serving of meat is about the same size as a deck of cards. One egg equals 1 oz. ? 2 servings of low-fat dairy each day. ? A serving of nuts, seeds, or beans 5 times each week. ? Heart-healthy fats. Healthy fats called Omega-3 fatty acids are found in foods such as flaxseeds and coldwater fish, like sardines, salmon, and mackerel.  Limit how much you eat of the following: ? Canned or prepackaged foods. ? Food that is high in trans fat, such as fried foods. ? Food that is high in saturated fat, such as fatty meat. ? Sweets, desserts, sugary drinks, and other foods with added sugar. ? Full-fat dairy products.  Do not salt foods before eating.  Try to eat at least 2 vegetarian meals each week.  Eat more home-cooked food and less restaurant, buffet, and fast food.  When eating at a restaurant,  ask that your food be prepared with less salt or no salt, if possible. What foods are recommended? The items listed may not be a complete list. Talk with your dietitian about what dietary choices are best for you. Grains Whole-grain or whole-wheat bread. Whole-grain or whole-wheat pasta. Brown rice. [place]. Bulgur. Whole-grain and low-sodium cereals. Pita bread. Low-fat, low-sodium crackers. Whole-wheat flour tortillas. Vegetables Fresh or frozen vegetables (raw, steamed, roasted, or grilled). Low-sodium or reduced-sodium tomato and vegetable juice. Low-sodium or reduced-sodium tomato  sauce and tomato paste. Low-sodium or reduced-sodium canned vegetables. Fruits All fresh, dried, or frozen fruit. Canned fruit in natural juice (without added sugar). Meat and other protein foods Skinless chicken or [place]. Ground chicken or [place]. Pork with fat trimmed off. Fish and seafood. Egg whites. Dried beans, peas, or lentils. Unsalted nuts, nut butters, and seeds. Unsalted canned beans. Lean cuts of beef with fat trimmed off. Low-sodium, lean deli meat. Dairy Low-fat (1%) or fat-free (skim) milk. Fat-free, low-fat, or reduced-fat cheeses. Nonfat, low-sodium ricotta or cottage cheese. Low-fat or nonfat yogurt. Low-fat, low-sodium cheese. Fats and oils Soft margarine without trans fats. Vegetable oil. Low-fat, reduced-fat, or light mayonnaise and salad dressings (reduced-sodium). Canola, safflower, olive, soybean, and sunflower oils. Avocado. Seasoning and other foods Herbs. Spices. Seasoning mixes without salt. Unsalted popcorn and pretzels. Fat-free sweets. What foods are not recommended? The items listed may not be a complete list. Talk with your dietitian about what dietary choices are best for you. Grains Baked goods made with fat, such as croissants, muffins, or some breads. Dry pasta or rice meal packs. Vegetables Creamed or fried vegetables. Vegetables in a cheese sauce. Regular canned vegetables (not low-sodium or reduced-sodium). Regular canned tomato sauce and paste (not low-sodium or reduced-sodium). Regular tomato and vegetable juice (not low-sodium or reduced-sodium). Krystal Allen. Olives. Fruits Canned fruit in a light or heavy syrup. Fried fruit. Fruit in cream or butter sauce. Meat and other protein foods Fatty cuts of meat. Ribs. Fried meat. Krystal Allen. Sausage. Bologna and other processed lunch meats. Salami. Fatback. Hotdogs. Bratwurst. Salted nuts and seeds. Canned beans with added salt. Canned or smoked fish. Whole eggs or egg yolks. Chicken or [place] with skin. Dairy Whole  or 2% milk, cream, and half-and-half. Whole or full-fat cream cheese. Whole-fat or sweetened yogurt. Full-fat cheese. Nondairy creamers. Whipped toppings. Processed cheese and cheese spreads. Fats and oils Butter. Stick margarine. Lard. Shortening. Ghee. Bacon fat. Tropical oils, such as coconut, palm kernel, or palm oil. Seasoning and other foods Salted popcorn and pretzels. Onion salt, garlic salt, seasoned salt, table salt, and sea salt. Worcestershire sauce. Tartar sauce. Barbecue sauce. Teriyaki sauce. Soy sauce, including reduced-sodium. Steak sauce. Canned and packaged gravies. Fish sauce. Oyster sauce. Cocktail sauce. Horseradish that you find on the shelf. Ketchup. Mustard. Meat flavorings and tenderizers. Bouillon cubes. Hot sauce and Tabasco sauce. Premade or packaged marinades. Premade or packaged taco seasonings. Relishes. Regular salad dressings. Where to find more information:  National Heart, Lung, and [place]: https://wilson-eaton.com/  American Heart Association: www.heart.org Summary  The DASH eating plan is a healthy eating plan that has been shown to reduce high blood pressure (hypertension). It may also reduce your risk for type 2 diabetes, heart disease, and stroke.  With the DASH eating plan, you should limit salt (sodium) intake to 2,300 mg a day. If you have hypertension, you may need to reduce your sodium intake to 1,500 mg a day.  When on the DASH eating plan, aim to eat more fresh  fruits and vegetables, whole grains, lean proteins, low-fat dairy, and heart-healthy fats.  Work with your health care provider or diet and nutrition specialist (dietitian) to adjust your eating plan to your individual calorie needs. This information is not intended to replace advice given to you by your health care provider. Make sure you discuss any questions you have with your health care provider. Document Released: 10/06/2011 Document Revised: 10/10/2016 Document Reviewed:  10/10/2016 Elsevier Interactive Patient Education  2017 [employer].

## 2017-04-20 NOTE — Addendum Note (Signed)
Addended by: Rica Mast on: 04/20/2017 11:27 AM   Modules accepted: Level of Service

## 2017-04-20 NOTE — Telephone Encounter (Signed)
Pt was called and informed of lab results. 

## 2017-04-27 ENCOUNTER — Ambulatory Visit: Payer: BLUE CROSS/BLUE SHIELD | Attending: Family Medicine | Admitting: Pharmacist

## 2017-04-27 VITALS — BP 134/85 | HR 64

## 2017-04-27 DIAGNOSIS — I10 Essential (primary) hypertension: Secondary | ICD-10-CM | POA: Insufficient documentation

## 2017-04-27 NOTE — Patient Instructions (Addendum)
Thanks for coming to see [place]!  Your blood pressure looks great!  Follow up with Dr. Adrian Blackwater in 1 month for blood pressure

## 2017-04-27 NOTE — Progress Notes (Signed)
   S:    Patient arrives in good spirits with her grandson.  Presents to the clinic for hypertension evaluation. Patient was referred on 04/13/17.  Patient was last seen by Primary Care Provider on 04/13/17.   Patient reports adherence with medications.  Current BP Medications include: amlodipine 10 mg daily.  Antihypertensives tried in the past include: lisinopril (patient didn't get to goal)   O:   Last 3 Office BP readings: BP Readings from Last 3 Encounters:  04/27/17 134/85  04/20/17 (!) 167/100  04/13/17 (!) 164/96    BMET    Component Value Date/Time   NA 143 04/13/2017 1447   NA 146 (H) 04/06/2017 1152   K 4.0 04/13/2017 1447   CL 108 04/13/2017 1447   CO2 24 04/13/2017 1437   GLUCOSE 114 (H) 04/13/2017 1447   BUN 21 (H) 04/13/2017 1447   BUN 13 04/06/2017 1152   CREATININE 1.00 04/13/2017 1447   CREATININE 1.22 (H) 01/15/2016 1000   CALCIUM 9.1 04/13/2017 1437   GFRNONAA 52 (L) 04/13/2017 1437   GFRNONAA 48 (L) 01/15/2016 1000   GFRAA >60 04/13/2017 1437   GFRAA 56 (L) 01/15/2016 1000    A/P: Hypertension longstanding currently controlled on current medication. No changes to any medications at this time. Encouraged patient to continue cutting back on cigarrette intake. Patient to follow up with Dr. Adrian Blackwater as directed.  Results reviewed and written information provided.   Total time in face-to-face counseling 10 minutes.   Patient seen with Maple Mirza, PharmD Candidate and Mechele Claude, PharmD Candidate

## 2017-05-09 MED FILL — ATORVASTATIN 40 MG TABLET: 40 | 30 days supply | Qty: 30 | Fill #1

## 2017-05-09 MED FILL — VENTOLIN HFA 90 MCG INHALER: 108 (90 BAS | 25 days supply | Qty: 18 | Fill #0

## 2017-05-09 MED FILL — NEO/POLYMYXIN/DEXAMETH DROP: 3.5-10000-0 | 12 days supply | Qty: 5 | Fill #1

## 2017-05-12 MED FILL — ADVAIR 250/50 DISKUS: 250-50 | 30 days supply | Qty: 60 | Fill #0

## 2017-05-19 ENCOUNTER — Encounter: Payer: Self-pay | Admitting: Family Medicine

## 2017-05-19 ENCOUNTER — Ambulatory Visit: Payer: BLUE CROSS/BLUE SHIELD | Attending: Family Medicine | Admitting: Family Medicine

## 2017-05-19 VITALS — BP 131/75 | HR 66 | Temp 98.5°F | Wt 233.4 lb

## 2017-05-19 DIAGNOSIS — E785 Hyperlipidemia, unspecified: Secondary | ICD-10-CM | POA: Insufficient documentation

## 2017-05-19 DIAGNOSIS — M79602 Pain in left arm: Secondary | ICD-10-CM | POA: Diagnosis not present

## 2017-05-19 DIAGNOSIS — Z7982 Long term (current) use of aspirin: Secondary | ICD-10-CM | POA: Diagnosis not present

## 2017-05-19 DIAGNOSIS — B351 Tinea unguium: Secondary | ICD-10-CM

## 2017-05-19 DIAGNOSIS — M79605 Pain in left leg: Secondary | ICD-10-CM | POA: Diagnosis not present

## 2017-05-19 DIAGNOSIS — J449 Chronic obstructive pulmonary disease, unspecified: Secondary | ICD-10-CM | POA: Diagnosis not present

## 2017-05-19 DIAGNOSIS — Z1231 Encounter for screening mammogram for malignant neoplasm of breast: Secondary | ICD-10-CM

## 2017-05-19 DIAGNOSIS — M4802 Spinal stenosis, cervical region: Secondary | ICD-10-CM | POA: Diagnosis not present

## 2017-05-19 DIAGNOSIS — F17210 Nicotine dependence, cigarettes, uncomplicated: Secondary | ICD-10-CM | POA: Diagnosis not present

## 2017-05-19 DIAGNOSIS — I10 Essential (primary) hypertension: Secondary | ICD-10-CM | POA: Insufficient documentation

## 2017-05-19 DIAGNOSIS — M5412 Radiculopathy, cervical region: Secondary | ICD-10-CM | POA: Diagnosis not present

## 2017-05-19 MED ORDER — LAMOTRIGINE 150 MG PO TABS: 150.0000 mg | ORAL_TABLET | Freq: Every day | ORAL | 2 refills | Status: DC

## 2017-05-19 MED ORDER — TERBINAFINE HCL 250 MG PO TABS: 250.0000 mg | ORAL_TABLET | Freq: Every day | ORAL | 2 refills | Status: DC

## 2017-05-19 MED ORDER — GABAPENTIN 100 MG PO CAPS: 100.0000 mg | ORAL_CAPSULE | Freq: Three times a day (TID) | ORAL | 1 refills | Status: DC

## 2017-05-19 MED FILL — AMLODIPINE BESYLATE 10 MG T: 10 | 30 days supply | Qty: 30 | Fill #1

## 2017-05-19 MED FILL — GABAPENTIN 100 MG CAPSULE: 100 | 30 days supply | Qty: 90 | Fill #0

## 2017-05-19 MED FILL — AMLODIPINE BESYLATE 5 MG TA: 5 | 30 days supply | Qty: 30 | Fill #1

## 2017-05-19 MED FILL — TERBINAFINE HCL 250 MG TAB: 250 | 30 days supply | Qty: 30 | Fill #0

## 2017-05-19 NOTE — Progress Notes (Signed)
Subjective:  Patient ID: Krystal Allen, female    DOB: 24-Mar-1956  Age: 61 y.o. MRN: [id number]  CC: Hypertension   HPI Krystal Allen has COPD, HTN, HLD, she presents for    1. CHRONIC HYPERTENSION  Disease Monitoring  Blood pressure range: 175/99 at home, elevated for the last month. This is associated with redness in her eyes and headache over the L eye.   Chest pain: no   Dyspnea: no   Claudication: yes, especially the L leg. She also has lower back pains    Medication compliance: yes Medication Side Effects  Lightheadedness: no   Urinary frequency: no   Edema: no    Preventitive Healthcare:  Exercise: yes, walking every day for 15-20 minutes. She is limited by L leg pain    Diet Pattern: eats 2 meals per day and 3-5 snacks per day   Salt Restriction: yes   2. L arm pain: she went to the ED on 04/13/17 due to pain radiating from left lateral chest down arm. She thought she was having a stroke. MRI of head was negative for acute infarct. MRI cervical spine revealed:  2. Moderate multilevel degenerative spondylolysis extending from C3-4 through C6-7 without significant canal stenosis. 3. Multifactorial degenerative changes with resultant multilevel foraminal narrowing as above. Most notable changes include moderate left C4 foraminal stenosis, severe right with moderate left C5 foraminal narrowing, with moderate bilateral C6 and C7 foraminal stenosis.  She reports pain has improved. She feels slight weakness in her L hand. She is R handed.   Social History  Substance Use Topics  . Smoking status: Current Every Day Smoker    Packs/day: 0.50    Types: Cigarettes  . Smokeless tobacco: Never Used  . Alcohol use 0.0 oz/week     Comment: occasional    Outpatient Medications Prior to Visit  Medication Sig Dispense Refill  . acetaminophen (TYLENOL 8 HOUR) 650 MG CR tablet Take 1 tablet (650 mg total) by mouth 2 (two) times daily as needed for pain. 60 tablet 5  .  albuterol (PROVENTIL HFA;VENTOLIN HFA) 108 (90 Base) MCG/ACT inhaler Inhale 2 puffs into the lungs every 6 (six) hours as needed for wheezing or shortness of breath. 54 g 3  . amLODipine (NORVASC) 10 MG tablet Take 1 tablet (10 mg total) by mouth daily. 30 tablet 2  . aspirin EC 81 MG tablet Take 1 tablet (81 mg total) by mouth daily. 30 tablet 11  . atorvastatin (LIPITOR) 40 MG tablet Take 1 tablet (40 mg total) by mouth daily. 30 tablet 11  . Fluticasone-Salmeterol (ADVAIR) 250-50 MCG/DOSE AEPB Inhale 1 puff into the lungs 2 (two) times daily. 180 each 3   No facility-administered medications prior to visit.     ROS Review of Systems  Constitutional: Negative for chills and fever.  Eyes: Negative for visual disturbance.  Respiratory: Negative for shortness of breath.   Cardiovascular: Negative for chest pain.  Gastrointestinal: Negative for abdominal pain and blood in stool.  Musculoskeletal: Positive for myalgias, neck pain and neck stiffness. Negative for arthralgias and back pain.  Skin: Negative for rash.  Allergic/Immunologic: Negative for immunocompromised state.  Hematological: Negative for adenopathy. Does not bruise/bleed easily.  Psychiatric/Behavioral: Negative for dysphoric mood and suicidal ideas.    Objective:  BP 131/75   Pulse 66   Temp 98.5 F (36.9 C) (Oral)   Wt 233 lb 6.4 oz (105.9 kg)   SpO2 98%   BMI 36.56 kg/m  BP/Weight 05/19/2017 04/27/2017 7/49/4496  Systolic BP 759 163 846  Diastolic BP 75 85 659  Wt. (Lbs) 233.4 - -  BMI 36.56 - -    Physical Exam  Constitutional: She is oriented to person, place, and time. She appears well-developed and well-nourished. No distress.  HENT:  Head: Normocephalic and atraumatic.  Neck: Normal range of motion. Neck supple. Muscular tenderness present.  Pain with neck extension and rotation to the left  Cardiovascular: Normal rate, regular rhythm, normal heart sounds and intact distal pulses.   Pulses:       Dorsalis pedis pulses are 2+ on the right side, and 1+ on the left side.       Posterior tibial pulses are 2+ on the right side, and 1+ on the left side.  Pulmonary/Chest: Effort normal and breath sounds normal.  Musculoskeletal: She exhibits no edema.       Feet:      Neurological: She is alert and oriented to person, place, and time.  Skin: Skin is warm and dry. No rash noted.  Psychiatric: She has a normal mood and affect.   Lab Results  Component Value Date   HGBA1C 5.7 04/06/2017     Assessment & Plan:  Krystal Allen was seen today for hypertension.  Diagnoses and all orders for this visit:  Onychomycosis of toenail -     Discontinue: lamoTRIgine (LAMICTAL) 150 MG tablet; Take 1 tablet (150 mg total) by mouth daily. -     terbinafine (LAMISIL) 250 MG tablet; Take 1 tablet (250 mg total) by mouth daily.  Cervical radiculopathy -     gabapentin (NEURONTIN) 100 MG capsule; Take 1 capsule (100 mg total) by mouth 3 (three) times daily.  Essential hypertension  Visit for screening mammogram -     MM DIGITAL SCREENING BILATERAL; Future   There are no diagnoses linked to this encounter.  No orders of the defined types were placed in this encounter.   Follow-up: Return in about 8 weeks (around 07/14/2017) for flu shot and L arm pain.   Boykin Nearing MD

## 2017-05-19 NOTE — Assessment & Plan Note (Signed)
Cervical radiculopathy She declined PT Gabapentin ordered

## 2017-05-19 NOTE — Patient Instructions (Addendum)
Dovey was seen today for hypertension.  Diagnoses and all orders for this visit:  Onychomycosis of toenail -     Discontinue: lamoTRIgine (LAMICTAL) 150 MG tablet; Take 1 tablet (150 mg total) by mouth daily. -     terbinafine (LAMISIL) 250 MG tablet; Take 1 tablet (250 mg total) by mouth daily.  Cervical radiculopathy -     gabapentin (NEURONTIN) 100 MG capsule; Take 1 capsule (100 mg total) by mouth 3 (three) times daily.  Essential hypertension  Visit for screening mammogram -     MM DIGITAL SCREENING BILATERAL; Future   Your blood pressure is doing great.  Smoking cessation support: smoking cessation hotline: 1-800-QUIT-NOW.  Smoking cessation classes are available through [facility]. Call [phone] or visit our website at [email or web address]   F/u in 8 weeks for flu shot/ check L sided arm pain   Cervical Radiculopathy Cervical radiculopathy means that a nerve in the neck is pinched or bruised. This can cause pain or loss of feeling (numbness) that runs from your neck to your arm and fingers. Follow these instructions at home: Managing pain  Take over-the-counter and prescription medicines only as told by your doctor.  If directed, put ice on the injured or painful area. ? Put ice in a plastic bag. ? Place a towel between your skin and the bag. ? Leave the ice on for 20 minutes, 2-3 times per day.  If ice does not help, you can try using heat. Take a warm shower or warm bath, or use a heat pack as told by your doctor.  You may try a gentle neck and shoulder massage. Activity  Rest as needed. Follow instructions from your doctor about any activities to avoid.  Do exercises as told by your doctor or physical therapist. General instructions  If you were given a soft collar, wear it as told by your doctor.  Use a flat pillow when you sleep.  Keep all follow-up visits as told by your doctor. This is important. Contact a doctor  if:  Your condition does not improve with treatment. Get help right away if:  Your pain gets worse and is not controlled with medicine.  You lose feeling or feel weak in your hand, arm, face, or leg.  You have a fever.  You have a stiff neck.  You cannot control when you poop or pee (have incontinence).  You have trouble with walking, balance, or talking. This information is not intended to replace advice given to you by your health care provider. Make sure you discuss any questions you have with your health care provider. Document Released: 10/06/2011 Document Revised: 03/24/2016 Document Reviewed: 12/11/2014 Elsevier Interactive Patient Education  [employer].

## 2017-05-19 NOTE — Assessment & Plan Note (Signed)
A: controlled Med: compliant P: continue Norvasc 10 mg daily

## 2017-05-19 NOTE — Assessment & Plan Note (Signed)
Lamisil x 12 week course

## 2017-07-21 ENCOUNTER — Ambulatory Visit: Payer: BLUE CROSS/BLUE SHIELD | Admitting: Internal Medicine

## 2017-07-31 MED FILL — TERBINAFINE HCL 250 MG TAB: 250 | 30 days supply | Qty: 30 | Fill #1

## 2017-07-31 MED FILL — ATORVASTATIN 40 MG TABLET: 40 | 30 days supply | Qty: 30 | Fill #2

## 2017-07-31 MED FILL — AMLODIPINE BESYLATE 10 MG T: 10 | 30 days supply | Qty: 30 | Fill #2

## 2017-10-22 ENCOUNTER — Ambulatory Visit (INDEPENDENT_AMBULATORY_CARE_PROVIDER_SITE_OTHER): Payer: BLUE CROSS/BLUE SHIELD

## 2017-10-22 ENCOUNTER — Ambulatory Visit (HOSPITAL_COMMUNITY)
Admission: EM | Admit: 2017-10-22 | Discharge: 2017-10-22 | Disposition: A | Discharge status: Home / Self Care | Payer: BLUE CROSS/BLUE SHIELD | Attending: Family Medicine | Admitting: Family Medicine

## 2017-10-22 DIAGNOSIS — R079 Chest pain, unspecified: Secondary | ICD-10-CM | POA: Diagnosis not present

## 2017-10-22 DIAGNOSIS — J440 Chronic obstructive pulmonary disease with acute lower respiratory infection: Secondary | ICD-10-CM

## 2017-10-22 DIAGNOSIS — R0602 Shortness of breath: Secondary | ICD-10-CM

## 2017-10-22 DIAGNOSIS — J209 Acute bronchitis, unspecified: Secondary | ICD-10-CM

## 2017-10-22 DIAGNOSIS — R059 Cough, unspecified: Secondary | ICD-10-CM

## 2017-10-22 DIAGNOSIS — J42 Unspecified chronic bronchitis: Secondary | ICD-10-CM

## 2017-10-22 DIAGNOSIS — R05 Cough: Secondary | ICD-10-CM | POA: Diagnosis not present

## 2017-10-22 MED ORDER — ALBUTEROL SULFATE HFA 108 (90 BASE) MCG/ACT IN AERS
2.0000 | INHALATION_SPRAY | Freq: Four times a day (QID) | RESPIRATORY_TRACT | Status: DC
  Administered 2017-10-22: 2 via RESPIRATORY_TRACT

## 2017-10-22 MED ORDER — AZITHROMYCIN 250 MG PO TABS: ORAL_TABLET | ORAL | 0 refills | Status: DC

## 2017-10-22 MED ORDER — ALBUTEROL SULFATE HFA 108 (90 BASE) MCG/ACT IN AERS
INHALATION_SPRAY | RESPIRATORY_TRACT | Status: AC
  Filled 2017-10-22: qty 6.7

## 2017-10-22 MED ORDER — PREDNISONE 10 MG PO TABS: 20.0000 mg | ORAL_TABLET | Freq: Two times a day (BID) | ORAL | 0 refills | Status: AC

## 2017-10-22 MED ORDER — IPRATROPIUM-ALBUTEROL 0.5-2.5 (3) MG/3ML IN SOLN
RESPIRATORY_TRACT | Status: AC
  Filled 2017-10-22: qty 6

## 2017-10-22 MED ORDER — IPRATROPIUM-ALBUTEROL 0.5-2.5 (3) MG/3ML IN SOLN
RESPIRATORY_TRACT | Status: AC
  Filled 2017-10-22: qty 3

## 2017-10-22 MED ORDER — PREDNISONE 10 MG PO TABS
20.0000 mg | ORAL_TABLET | Freq: Two times a day (BID) | ORAL | 0 refills | Status: DC
Start: 2017-10-22 — End: 2017-10-22

## 2017-10-22 MED ORDER — IPRATROPIUM-ALBUTEROL 0.5-2.5 (3) MG/3ML IN SOLN
3.0000 mL | Freq: Once | RESPIRATORY_TRACT | Status: AC
  Administered 2017-10-22: 3 mL via RESPIRATORY_TRACT

## 2017-10-22 MED ORDER — FLUTICASONE-SALMETEROL 250-50 MCG/DOSE IN AEPB: 1.0000 | INHALATION_SPRAY | Freq: Two times a day (BID) | RESPIRATORY_TRACT | 0 refills | Status: DC

## 2017-10-22 MED ORDER — ALBUTEROL SULFATE HFA 108 (90 BASE) MCG/ACT IN AERS: 2.0000 | INHALATION_SPRAY | Freq: Four times a day (QID) | RESPIRATORY_TRACT | 0 refills | Status: DC | PRN

## 2017-10-22 NOTE — Discharge Instructions (Addendum)
I have given you prescriptions to get back on daily COPD inhaler (Advair), albuterol rescue inhaler refilled. Please use Advair daily and Albuterol every 6 hours for the first 24-48 hours, follow by as needed use for shortness of breath.  Prednisone for 3 days to help with inflammation.  Azithromycin 2 tablets today, 1 tablet for the next 4 days.   You may take mucinex (oral decongstant) with daily anti-histamine/allergy pill like zyrtec, claritin, store brand for congestion.  Honey Tea For sore throat try using a honey-based tea. Use 3 teaspoons of honey with juice squeezed from half lemon. Place shaved pieces of ginger into 1/2-1 cup of water and warm over stove top. Then mix the ingredients and repeat every 4 hours as needed.

## 2017-10-22 NOTE — ED Triage Notes (Signed)
Pt c/o cough, sob, and back pain, with urinary and fecal incontinence onset Friday. Pt reports she has been using otc theraflu, delsym, and cough drops with no relief. Pt is unsure if she has had fever but has felt has had hot and cold chills. She did take it once and it was 99.4. 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

## 2017-10-22 NOTE — ED Provider Notes (Signed)
[facility]    CSN: [id number] Arrival date & time: 10/22/17  1817     History   Chief Complaint Chief Complaint  Patient presents with  . URI    HPI Krystal Allen is a 61 y.o. female history of COPD presenting with cough, congestion, sore throat since Friday. Associated with shortness of breath, headache, backache. Has developed urinary and fecal incontinence with coughing. Hot and cold chills. Tried theraflu, delsym, and cough drops. Denies using advair inhaler for daily COPD treatment. Also has albuterol but states she is almost out. She has not had a COPD exacerbation in a long time. Smoke 4 cigarettes a day.   HPI  Past Medical History:  Diagnosis Date  . COPD (chronic obstructive pulmonary disease) ([place])   . Depression   . GERD (gastroesophageal reflux disease)   . Hernia, abdominal   . History of chicken pox   . Hyperlipidemia   . Hypertension   . Internal hemorrhoids with Grade 3 prolapse and bleeding 06/19/2013  . Osteoarthritis   . Personal history of colonic adenomas 06/25/2013    Patient Active Problem List   Diagnosis Date Noted  . Cervical radiculopathy 05/19/2017  . Onychomycosis of toenail 05/19/2017  . Pain of left calf 04/06/2017  . COPD (chronic obstructive pulmonary disease) ([place]) 02/23/2016  . Enlarged tonsils 02/23/2016  . Obesity (BMI 30-39.9) 01/15/2016  . Bilateral shoulder pain 01/15/2016  . Esophageal reflux 01/15/2016  . Tobacco abuse 05/13/2015  . Lung nodule 08/14/2014  . Impaired fasting glucose 08/14/2014  . Hyperlipidemia 08/14/2014  . Personal history of colonic adenomas 06/25/2013  . HTN (hypertension) 05/20/2013    Past Surgical History:  Procedure Laterality Date  . ABDOMINAL HYSTERECTOMY  19yrs ago   due to heavy bleeding and fibroids   . COLONOSCOPY    . HEMORRHOID BANDING  2014  . HEMORRHOID SURGERY  22yrs agao  . UMBILICAL HERNIA REPAIR  06/2015    OB History    No data available       Home  Medications    Prior to Admission medications   Medication Sig Start Date End Date Taking? Authorizing Provider  acetaminophen (TYLENOL 8 HOUR) 650 MG CR tablet Take 1 tablet (650 mg total) by mouth 2 (two) times daily as needed for pain. 04/06/17  Yes Funches, Josalyn, MD  amLODipine (NORVASC) 10 MG tablet Take 1 tablet (10 mg total) by mouth daily. 04/20/17  Yes Tresa Garter, MD  aspirin EC 81 MG tablet Take 1 tablet (81 mg total) by mouth daily. 04/06/17  Yes Funches, Josalyn, MD  atorvastatin (LIPITOR) 40 MG tablet Take 1 tablet (40 mg total) by mouth daily. 04/06/17  Yes Funches, Josalyn, MD  gabapentin (NEURONTIN) 100 MG capsule Take 1 capsule (100 mg total) by mouth 3 (three) times daily. 05/19/17  Yes Funches, Josalyn, MD  terbinafine (LAMISIL) 250 MG tablet Take 1 tablet (250 mg total) by mouth daily. 05/19/17  Yes Funches, Josalyn, MD  albuterol (PROVENTIL HFA;VENTOLIN HFA) 108 (90 Base) MCG/ACT inhaler Inhale 2 puffs into the lungs every 6 (six) hours as needed for up to 14 days for wheezing or shortness of breath. 10/22/17 11/05/17  Wieters, Hallie C, PA-C  azithromycin (ZITHROMAX Z-PAK) 250 MG tablet Please take 2 tablets on the first day, then 1 tablet for the following 4 days. 10/22/17   Wieters, Hallie C, PA-C  Fluticasone-Salmeterol (ADVAIR) 250-50 MCG/DOSE AEPB Inhale 1 puff into the lungs 2 (two) times daily for 14 days. 10/22/17  11/05/17  Wieters, Hallie C, PA-C  predniSONE (DELTASONE) 10 MG tablet Take 2 tablets (20 mg total) by mouth 2 (two) times daily with a meal for 3 days. 10/22/17 10/25/17  Wieters, Elesa Hacker, PA-C    Family History Family History  Problem Relation Age of Onset  . Cancer Father        prostate  . Hypertension Mother   . Diabetes Mother   . Hypertension Sister   . Heart disease Maternal Uncle   . Stroke Maternal Grandmother   . Hypertension Maternal Grandmother   . Hypertension Sister   . Colon cancer Neg Hx     Social History Social History    Tobacco Use  . Smoking status: Current Every Day Smoker    Packs/day: 0.50    Types: Cigarettes  . Smokeless tobacco: Never Used  Substance Use Topics  . Alcohol use: Yes    Alcohol/week: 0.0 oz    Comment: occasional  . Drug use: No     Allergies   Sulfa antibiotics; Fish allergy; Penicillins; and Latex   Review of Systems Review of Systems  Constitutional: Positive for chills, fatigue and fever.  HENT: Positive for congestion, rhinorrhea and sore throat. Negative for trouble swallowing.   Eyes: Negative for pain and visual disturbance.  Respiratory: Positive for cough, chest tightness and shortness of breath.   Cardiovascular: Negative for chest pain.  Gastrointestinal: Negative for abdominal pain, diarrhea, nausea and vomiting.  Genitourinary:       Incontinence- urinary and fecal  Musculoskeletal: Positive for back pain and myalgias.  Skin: Negative for rash.  Neurological: Positive for headaches. Negative for dizziness, weakness and light-headedness.     Physical Exam Triage Vital Signs ED Triage Vitals  Enc Vitals Group     BP 10/22/17 1836 (!) 168/95     Pulse Rate 10/22/17 1836 88     Resp --      Temp 10/22/17 1836 99.3 F (37.4 C)     Temp Source 10/22/17 1836 Oral     SpO2 10/22/17 1836 93 %     Weight --      Height --      Head Circumference --      Peak Flow --      Pain Score 10/22/17 1838 10     Pain Loc --      Pain Edu? --      Excl. in [place]? --    No data found.  Updated Vital Signs BP (!) 168/95 (BP Location: Left Arm)   Pulse 88   Temp 99.3 F (37.4 C) (Oral)   SpO2 93% Comment: pt states she has COPD   Physical Exam  Constitutional: She is oriented to person, place, and time. She appears well-developed and well-nourished. No distress.  HENT:  Head: Normocephalic and atraumatic.  Right Ear: Tympanic membrane and ear canal normal.  Left Ear: Tympanic membrane and ear canal normal.  Nose: Nose normal.  Mouth/Throat: Uvula is  midline and mucous membranes are normal. No trismus in the jaw. No uvula swelling. Posterior oropharyngeal erythema present. Tonsils are 1+ on the right. Tonsils are 1+ on the left.  Eyes: Conjunctivae are normal.  Neck: Neck supple.  Cardiovascular: Normal rate and regular rhythm.  No murmur heard. Pulmonary/Chest: Effort normal. No respiratory distress. She has wheezes.  Inspiratory rhonchi, inspiratory and expiratory wheezing throughout bilateral lung fields  After breathing treatment- rhonchi cleared, mild inspiratory wheezing.  Abdominal: Soft. She exhibits no distension. There is  no tenderness. There is no guarding.  Musculoskeletal: She exhibits no edema.  Neurological: She is alert and oriented to person, place, and time.  Skin: Skin is warm and dry.  Psychiatric: She has a normal mood and affect.  Nursing note and vitals reviewed.    UC Treatments / Results  Labs (all labs ordered are listed, but only abnormal results are displayed) Labs Reviewed - No data to display  EKG  EKG Interpretation None       Radiology Dg Chest 2 View  Result Date: 10/22/2017 CLINICAL DATA:  Per pt: sick for two days with cough, low grade fever, sore throat, chest and back pain. History of Bronchitis. No history of cardiac disease. HBP controlled with medication. Smoker of 4+ cigarettes per day. Patient is not a diabetic EXAM: CHEST  2 VIEW COMPARISON:  Chest radiograph, 07/23/2012.  Chest CT, 01/20/2016. FINDINGS: Cardiac silhouette is normal in size and configuration. No mediastinal or hilar masses. No evidence of adenopathy. Lungs show prominent bronchovascular markings diffusely, increased when compared to the prior chest radiographs. This could a diffuse to interstitial inflammatory process is more likely chronic interstitial thickening related to smoking. There is no lung consolidation to suggest pneumonia. No evidence of pulmonary edema. No pleural effusion or pneumothorax. Skeletal  structures are intact. IMPRESSION: 1. Prominent bronchovascular markings interstitial markings increased when compared to the prior chest radiograph. This is most likely worsened smoking related interstitial disease. 2. No pneumonia or pulmonary edema. Electronically Signed   By: Lajean Manes M.D.   On: 10/22/2017 19:29    Procedures Procedures (including critical care time)  Medications Ordered in UC Medications  albuterol (PROVENTIL HFA;VENTOLIN HFA) 108 (90 Base) MCG/ACT inhaler 2 puff (2 puffs Inhalation Given 10/22/17 1948)  ipratropium-albuterol (DUONEB) 0.5-2.5 (3) MG/3ML nebulizer solution 3 mL (3 mLs Nebulization Given 10/22/17 1945)     Initial Impression / Assessment and Plan / UC Course  I have reviewed the triage vital signs and the nursing notes.  Pertinent labs & imaging results that were available during my care of the patient were reviewed by me and considered in my medical decision making (see chart for details).     Likely a viral URI complicated by COPD. Wheezing improved with breathing treatment. Advised to get back on daily COPD medications of Advair, Albuterol PRN. Given prednisone 20 mg BID x 3 days and azithromycin. Follow up with Primary care and a pulmonologist.   Discussed return precautions with patient and daughter. Patient verbalized understanding and is agreeable with plan.   Final Clinical Impressions(s) / UC Diagnoses   Final diagnoses:  Cough  COPD (chronic obstructive pulmonary disease) with acute bronchitis [facility])    ED Discharge Orders        Ordered    albuterol (PROVENTIL HFA;VENTOLIN HFA) 108 (90 Base) MCG/ACT inhaler  Every 6 hours PRN     10/22/17 1923    Fluticasone-Salmeterol (ADVAIR) 250-50 MCG/DOSE AEPB  2 times daily     10/22/17 1923    predniSONE (DELTASONE) 10 MG tablet  2 times daily with meals,   Status:  Discontinued     10/22/17 1923    azithromycin (ZITHROMAX Z-PAK) 250 MG tablet     10/22/17 1923    predniSONE  (DELTASONE) 10 MG tablet  2 times daily with meals     10/22/17 1924       Controlled Substance Prescriptions [place] Controlled Substance Registry consulted? Not Applicable   Janith Lima, [place] 10/22/17 2050

## 2017-10-25 MED FILL — predniSONE 10 MG TABS: 10 | 3 days supply | Qty: 12 | Fill #0

## 2017-10-26 ENCOUNTER — Ambulatory Visit (HOSPITAL_COMMUNITY)
Admission: EM | Admit: 2017-10-26 | Discharge: 2017-10-26 | Disposition: A | Discharge status: Home / Self Care | Payer: BLUE CROSS/BLUE SHIELD | Attending: Emergency Medicine | Admitting: Emergency Medicine

## 2017-10-26 ENCOUNTER — Encounter (HOSPITAL_COMMUNITY): Payer: Self-pay | Admitting: Family Medicine

## 2017-10-26 DIAGNOSIS — R05 Cough: Secondary | ICD-10-CM

## 2017-10-26 DIAGNOSIS — R0602 Shortness of breath: Secondary | ICD-10-CM

## 2017-10-26 DIAGNOSIS — J449 Chronic obstructive pulmonary disease, unspecified: Secondary | ICD-10-CM

## 2017-10-26 DIAGNOSIS — R059 Cough, unspecified: Secondary | ICD-10-CM

## 2017-10-26 DIAGNOSIS — J029 Acute pharyngitis, unspecified: Secondary | ICD-10-CM | POA: Diagnosis not present

## 2017-10-26 MED ORDER — IPRATROPIUM-ALBUTEROL 0.5-2.5 (3) MG/3ML IN SOLN
RESPIRATORY_TRACT | Status: AC
  Filled 2017-10-26: qty 3

## 2017-10-26 MED ORDER — IPRATROPIUM-ALBUTEROL 0.5-2.5 (3) MG/3ML IN SOLN
3.0000 mL | Freq: Once | RESPIRATORY_TRACT | Status: AC
  Administered 2017-10-26: 3 mL via RESPIRATORY_TRACT

## 2017-10-26 MED ORDER — HYDROCODONE-HOMATROPINE 5-1.5 MG/5ML PO SYRP: 5.0000 mL | ORAL_SOLUTION | Freq: Four times a day (QID) | ORAL | 0 refills | Status: AC | PRN

## 2017-10-26 NOTE — ED Provider Notes (Signed)
[facility]    CSN: [id number] Arrival date & time: 10/26/17  1025     History   Chief Complaint Chief Complaint  Patient presents with  . Shortness of Breath  . Cough    HPI ANIESHA HAUGHN is a 61 y.o. female with history of COPD presenting with persistent cough and shortness of breath. She was seen here on Sunday 12/23 by myself and had a CXR that was negative for pneumonia, mild changes likely related to smoking, received breathing treatment before leaving and sent home to get back on her inhalers, azithromycin and 3 day course of prednisone. Today she is back with persistent cough, states she does not feel worse, just has not improved. Her main issue is the cough as it has caused some urinary and fecal incontinence. She denies any difficulty breathing, states it increases when she walks but she is comfortable at rest.   HPI  Past Medical History:  Diagnosis Date  . COPD (chronic obstructive pulmonary disease) ([place])   . Depression   . GERD (gastroesophageal reflux disease)   . Hernia, abdominal   . History of chicken pox   . Hyperlipidemia   . Hypertension   . Internal hemorrhoids with Grade 3 prolapse and bleeding 06/19/2013  . Osteoarthritis   . Personal history of colonic adenomas 06/25/2013    Patient Active Problem List   Diagnosis Date Noted  . Cervical radiculopathy 05/19/2017  . Onychomycosis of toenail 05/19/2017  . Pain of left calf 04/06/2017  . COPD (chronic obstructive pulmonary disease) ([place]) 02/23/2016  . Enlarged tonsils 02/23/2016  . Obesity (BMI 30-39.9) 01/15/2016  . Bilateral shoulder pain 01/15/2016  . Esophageal reflux 01/15/2016  . Tobacco abuse 05/13/2015  . Lung nodule 08/14/2014  . Impaired fasting glucose 08/14/2014  . Hyperlipidemia 08/14/2014  . Personal history of colonic adenomas 06/25/2013  . HTN (hypertension) 05/20/2013    Past Surgical History:  Procedure Laterality Date  . ABDOMINAL HYSTERECTOMY  29yrs ago   due to heavy bleeding and fibroids   . COLONOSCOPY    . HEMORRHOID BANDING  2014  . HEMORRHOID SURGERY  3yrs agao  . UMBILICAL HERNIA REPAIR  06/2015    OB History    No data available       Home Medications    Prior to Admission medications   Medication Sig Start Date End Date Taking? Authorizing Provider  acetaminophen (TYLENOL 8 HOUR) 650 MG CR tablet Take 1 tablet (650 mg total) by mouth 2 (two) times daily as needed for pain. 04/06/17   Funches, Adriana Mccallum, MD  albuterol (PROVENTIL HFA;VENTOLIN HFA) 108 (90 Base) MCG/ACT inhaler Inhale 2 puffs into the lungs every 6 (six) hours as needed for up to 14 days for wheezing or shortness of breath. 10/22/17 11/05/17  Laporche Martelle C, PA-C  amLODipine (NORVASC) 10 MG tablet Take 1 tablet (10 mg total) by mouth daily. 04/20/17   Tresa Garter, MD  aspirin EC 81 MG tablet Take 1 tablet (81 mg total) by mouth daily. 04/06/17   Funches, Adriana Mccallum, MD  atorvastatin (LIPITOR) 40 MG tablet Take 1 tablet (40 mg total) by mouth daily. 04/06/17   Funches, Adriana Mccallum, MD  azithromycin (ZITHROMAX Z-PAK) 250 MG tablet Please take 2 tablets on the first day, then 1 tablet for the following 4 days. 10/22/17   Gem Conkle C, PA-C  Fluticasone-Salmeterol (ADVAIR) 250-50 MCG/DOSE AEPB Inhale 1 puff into the lungs 2 (two) times daily for 14 days. 10/22/17 11/05/17  Debara Pickett  C, PA-C  gabapentin (NEURONTIN) 100 MG capsule Take 1 capsule (100 mg total) by mouth 3 (three) times daily. 05/19/17   Funches, Adriana Mccallum, MD  HYDROcodone-homatropine (HYCODAN) 5-1.5 MG/5ML syrup Take 5 mLs by mouth every 6 (six) hours as needed for up to 7 days for cough. Do not drive while taking this medicine. Please reserve for evening/at home use. 10/26/17 11/02/17  Hasheem Voland C, PA-C  terbinafine (LAMISIL) 250 MG tablet Take 1 tablet (250 mg total) by mouth daily. 05/19/17   Boykin Nearing, MD    Family History Family History  Problem Relation Age of Onset  . Cancer Father         prostate  . Hypertension Mother   . Diabetes Mother   . Hypertension Sister   . Heart disease Maternal Uncle   . Stroke Maternal Grandmother   . Hypertension Maternal Grandmother   . Hypertension Sister   . Colon cancer Neg Hx     Social History Social History   Tobacco Use  . Smoking status: Current Every Day Smoker    Packs/day: 0.50    Types: Cigarettes  . Smokeless tobacco: Never Used  Substance Use Topics  . Alcohol use: Yes    Alcohol/week: 0.0 oz    Comment: occasional  . Drug use: No     Allergies   Sulfa antibiotics; Fish allergy; Penicillins; and Latex   Review of Systems Review of Systems  Constitutional: Positive for fatigue. Negative for fever.  HENT: Positive for sore throat and voice change. Negative for congestion.   Respiratory: Positive for cough. Negative for shortness of breath.   Cardiovascular: Negative for chest pain.  Gastrointestinal: Negative for abdominal pain, nausea and vomiting.  Genitourinary:       Fecal/urinary incontinence  Musculoskeletal: Negative for myalgias.  Neurological: Negative for dizziness, syncope, weakness, light-headedness and headaches.     Physical Exam Triage Vital Signs ED Triage Vitals [10/26/17 1057]  Enc Vitals Group     BP 138/75     Pulse Rate 74     Resp 18     Temp 98.6 F (37 C)     Temp src      SpO2 94 %     Weight      Height      Head Circumference      Peak Flow      Pain Score      Pain Loc      Pain Edu?      Excl. in [place]?    No data found.  Updated Vital Signs BP 138/75   Pulse 74   Temp 98.6 F (37 C)   Resp 18   SpO2 94%  95% after breathing treatment  Physical Exam  Constitutional: She is oriented to person, place, and time. She appears well-developed and well-nourished. No distress.  HENT:  Head: Normocephalic and atraumatic.  Eyes: Conjunctivae are normal.  Neck: Neck supple.  Cardiovascular: Normal rate and regular rhythm.  No murmur heard. Pulmonary/Chest:  Effort normal. No respiratory distress. She has wheezes. She has rhonchi.  Coarse breath sounds throughout chest bilaterally, inspiratory and expiratory wheezing and rhonchi.  Abdominal: Soft. There is no tenderness.  Musculoskeletal: She exhibits no edema.  Neurological: She is alert and oriented to person, place, and time.  Skin: Skin is warm and dry.  Psychiatric: She has a normal mood and affect.  Nursing note and vitals reviewed.    UC Treatments / Results  Labs (all labs ordered are  listed, but only abnormal results are displayed) Labs Reviewed - No data to display  EKG  EKG Interpretation None       Radiology No results found.  Procedures Procedures (including critical care time)  Medications Ordered in UC Medications  ipratropium-albuterol (DUONEB) 0.5-2.5 (3) MG/3ML nebulizer solution 3 mL (3 mLs Nebulization Given 10/26/17 1116)     Initial Impression / Assessment and Plan / UC Course  I have reviewed the triage vital signs and the nursing notes.  Pertinent labs & imaging results that were available during my care of the patient were reviewed by me and considered in my medical decision making (see chart for details).    Patient experiencing a cough/URI complicated by COPD. Main concern today appears to be the cough. Advised this may continue for 2-3 weeks. Provided Hycodan syrup for use only in the evening. Advised not to drive or operate machinery while using. Discussed with Dr. Valere Dross need for additional imaging/steroids/antibiotics; as patient is not worsening this will be deferred for now. Will treat the cough and continue inhalers today. Discussed return precautions. Patient verbalized understanding and is agreeable with plan.    Final Clinical Impressions(s) / UC Diagnoses   Final diagnoses:  Cough  Chronic obstructive pulmonary disease, unspecified COPD type [facility])    ED Discharge Orders        Ordered    HYDROcodone-homatropine (HYCODAN) 5-1.5  MG/5ML syrup  Every 6 hours PRN     10/26/17 1159       Controlled Substance Prescriptions [place] Controlled Substance Registry consulted? Yes, evaluated risk vs. Benefit, patient was not on registry.   Eugean Arnott, [place] C, PA-C 10/26/17 2200    Janith Lima, [place] 10/26/17 2235

## 2017-10-26 NOTE — ED Triage Notes (Signed)
Pt here for continued SOB and cough. Was seen here Sunday. sts that she isnt getting any better.

## 2017-10-26 NOTE — ED Notes (Signed)
Notified Hallie PA of completion of breathing treatment

## 2017-10-26 NOTE — Discharge Instructions (Signed)
Cough will likely persist for 2-3 weeks. Cough syrup (Hycodan) for relief of cough when at home/ in the evening. Please do not drive while using this as it may cause some drowsiness.   Continue Advair and Albuterol inhalers. Honey as needed.  Stay hydrated, drink plenty of fluids.  Please return if develop increased shortness of breath, difficulty breathing, chest pain, increased phlegm production.

## 2017-10-30 ENCOUNTER — Other Ambulatory Visit: Payer: Self-pay | Admitting: Internal Medicine

## 2017-10-30 DIAGNOSIS — I10 Essential (primary) hypertension: Secondary | ICD-10-CM

## 2017-10-30 MED FILL — AMLODIPINE BESYLATE 10 MG T: 10 | 30 days supply | Qty: 30 | Fill #0

## 2017-11-08 ENCOUNTER — Ambulatory Visit: Payer: BLUE CROSS/BLUE SHIELD | Attending: Internal Medicine | Admitting: Physician Assistant

## 2017-11-08 VITALS — BP 130/84 | HR 51 | Temp 98.0°F | Resp 16 | Wt 238.6 lb

## 2017-11-08 DIAGNOSIS — Z880 Allergy status to penicillin: Secondary | ICD-10-CM | POA: Diagnosis not present

## 2017-11-08 DIAGNOSIS — J42 Unspecified chronic bronchitis: Secondary | ICD-10-CM | POA: Diagnosis not present

## 2017-11-08 DIAGNOSIS — Z91013 Allergy to seafood: Secondary | ICD-10-CM | POA: Diagnosis not present

## 2017-11-08 DIAGNOSIS — Z09 Encounter for follow-up examination after completed treatment for conditions other than malignant neoplasm: Secondary | ICD-10-CM | POA: Diagnosis not present

## 2017-11-08 DIAGNOSIS — Z91040 Latex allergy status: Secondary | ICD-10-CM | POA: Diagnosis not present

## 2017-11-08 DIAGNOSIS — Z7982 Long term (current) use of aspirin: Secondary | ICD-10-CM | POA: Insufficient documentation

## 2017-11-08 DIAGNOSIS — K219 Gastro-esophageal reflux disease without esophagitis: Secondary | ICD-10-CM | POA: Diagnosis not present

## 2017-11-08 DIAGNOSIS — Z882 Allergy status to sulfonamides status: Secondary | ICD-10-CM | POA: Diagnosis not present

## 2017-11-08 DIAGNOSIS — E785 Hyperlipidemia, unspecified: Secondary | ICD-10-CM | POA: Diagnosis not present

## 2017-11-08 DIAGNOSIS — Z86010 Personal history of colonic polyps: Secondary | ICD-10-CM | POA: Insufficient documentation

## 2017-11-08 DIAGNOSIS — J449 Chronic obstructive pulmonary disease, unspecified: Secondary | ICD-10-CM | POA: Insufficient documentation

## 2017-11-08 DIAGNOSIS — Z79899 Other long term (current) drug therapy: Secondary | ICD-10-CM | POA: Insufficient documentation

## 2017-11-08 DIAGNOSIS — I10 Essential (primary) hypertension: Secondary | ICD-10-CM

## 2017-11-08 DIAGNOSIS — R05 Cough: Secondary | ICD-10-CM | POA: Diagnosis present

## 2017-11-08 DIAGNOSIS — M1990 Unspecified osteoarthritis, unspecified site: Secondary | ICD-10-CM | POA: Diagnosis not present

## 2017-11-08 MED ORDER — FLUTICASONE-SALMETEROL 250-50 MCG/DOSE IN AEPB: 1.0000 | INHALATION_SPRAY | Freq: Two times a day (BID) | RESPIRATORY_TRACT | 1 refills | Status: DC

## 2017-11-08 MED ORDER — BENZONATATE 100 MG PO CAPS: 100.0000 mg | ORAL_CAPSULE | Freq: Two times a day (BID) | ORAL | 0 refills | Status: DC | PRN

## 2017-11-08 MED ORDER — ALBUTEROL SULFATE HFA 108 (90 BASE) MCG/ACT IN AERS: 2.0000 | INHALATION_SPRAY | Freq: Four times a day (QID) | RESPIRATORY_TRACT | 0 refills | Status: DC | PRN

## 2017-11-08 MED ORDER — ALBUTEROL SULFATE (2.5 MG/3ML) 0.083% IN NEBU: 2.5000 mg | INHALATION_SOLUTION | Freq: Four times a day (QID) | RESPIRATORY_TRACT | 1 refills | Status: DC | PRN

## 2017-11-08 MED FILL — VENTOLIN HFA 90 MCG INHALER: 108 (90 BAS | 24 days supply | Qty: 18 | Fill #0

## 2017-11-08 MED FILL — BENZONATATE 100 MG CAPSULE: 100 | 10 days supply | Qty: 20 | Fill #0

## 2017-11-08 MED FILL — ALBUTEROL 0.083% INHAL SOLN: (2.5 MG/3ML | 30 days supply | Qty: 90 | Fill #0

## 2017-11-08 NOTE — Progress Notes (Signed)
Patient ID: Krystal Allen, female   DOB: Feb 08, 1956, 62 y.o.   MRN: [id number]   Krystal Allen, is a 62 y.o. female  HFW:[id number]  OYD:[id number]  DOB - [birth year]  Subjective:  Chief Complaint and HPI: Krystal Allen is a 62 y.o. female here today to establish care and for a follow up visit After being seen in the ED 12/23 and 10/26/2017 for cough.  CXR was negative but Given prednisone, restarted on COPD meds, and given breathing treatments on 12/23.  Hycodan syrup was added on 10/26/2017.  She is much improved.  No f/c.  Still coughing and wheezing some.  Needs a new nebulizer for home use.  Has COPD.    ED/Hospital notes reviewed.    ROS:   Constitutional:  No f/c, No night sweats, No unexplained weight loss. EENT:  No vision changes, No blurry vision, No hearing changes. No mouth, throat, or ear problems.  Respiratory: + cough, + SOB/wheezing Cardiac: No CP, no palpitations GI:  No abd pain, No N/V/D. GU: No Urinary s/sx Musculoskeletal: No joint pain Neuro: No headache, no dizziness, no motor weakness.  Skin: No rash Endocrine:  No polydipsia. No polyuria.  Psych: Denies SI/HI  No problems updated.  ALLERGIES: Allergies  Allergen Reactions  . Sulfa Antibiotics Anaphylaxis and Hives  . Fish Allergy Other (See Comments)    Severe stomach cramps   . Penicillins Other (See Comments)    Has patient had a PCN reaction causing immediate rash, facial/tongue/throat swelling, SOB or lightheadedness with hypotension: No Has patient had a PCN reaction causing severe rash involving mucus membranes or skin necrosis: No Has patient had a PCN reaction that required hospitalization No Has patient had a PCN reaction occurring within the last 10 years: No If all of the above answers are "NO", then may proceed with Cephalosporin use.  Welts   . Latex Rash and Other (See Comments)    Dry skin    PAST MEDICAL HISTORY: Past Medical History:  Diagnosis Date  . COPD (chronic  obstructive pulmonary disease) ([place])   . Depression   . GERD (gastroesophageal reflux disease)   . Hernia, abdominal   . History of chicken pox   . Hyperlipidemia   . Hypertension   . Internal hemorrhoids with Grade 3 prolapse and bleeding 06/19/2013  . Osteoarthritis   . Personal history of colonic adenomas 06/25/2013    MEDICATIONS AT HOME: Prior to Admission medications   Medication Sig Start Date End Date Taking? Authorizing Provider  acetaminophen (TYLENOL 8 HOUR) 650 MG CR tablet Take 1 tablet (650 mg total) by mouth 2 (two) times daily as needed for pain. 04/06/17   Funches, Adriana Mccallum, MD  albuterol (PROVENTIL HFA;VENTOLIN HFA) 108 (90 Base) MCG/ACT inhaler Inhale 2 puffs into the lungs every 6 (six) hours as needed for up to 14 days for wheezing or shortness of breath. 11/08/17 11/22/17  Argentina Donovan, PA-C  albuterol (PROVENTIL) (2.5 MG/3ML) 0.083% nebulizer solution Take 3 mLs (2.5 mg total) by nebulization every 6 (six) hours as needed for wheezing or shortness of breath. 11/08/17   Argentina Donovan, PA-C  amLODipine (NORVASC) 10 MG tablet TAKE 1 TABLET BY MOUTH DAILY. 10/30/17   Arnoldo Morale, MD  aspirin EC 81 MG tablet Take 1 tablet (81 mg total) by mouth daily. 04/06/17   Funches, Adriana Mccallum, MD  atorvastatin (LIPITOR) 40 MG tablet Take 1 tablet (40 mg total) by mouth daily. 04/06/17   Boykin Nearing, MD  benzonatate (TESSALON) 100 MG  capsule Take 1 capsule (100 mg total) by mouth 2 (two) times daily as needed for cough. 11/08/17   Argentina Donovan, PA-C  Fluticasone-Salmeterol (ADVAIR) 250-50 MCG/DOSE AEPB Inhale 1 puff into the lungs 2 (two) times daily for 14 days. 11/08/17 11/22/17  Argentina Donovan, PA-C  gabapentin (NEURONTIN) 100 MG capsule Take 1 capsule (100 mg total) by mouth 3 (three) times daily. Patient not taking: Reported on 11/08/2017 05/19/17   Boykin Nearing, MD  terbinafine (LAMISIL) 250 MG tablet Take 1 tablet (250 mg total) by mouth daily. 05/19/17   Funches, Adriana Mccallum, MD       Objective:  EXAM:   Vitals:   11/08/17 1053  BP: 130/84  Pulse: (!) 51  Resp: 16  Temp: 98 F (36.7 C)  TempSrc: Oral  SpO2: 96%  Weight: 238 lb 9.6 oz (108.2 kg)    General appearance : A&OX3. NAD. Non-toxic-appearing HEENT: Atraumatic and Normocephalic.  PERRLA. EOM intact.  TM clear B. Mouth-MMM, post pharynx WNL w/o erythema, No PND. Neck: supple, no JVD. No cervical lymphadenopathy. No thyromegaly Chest/Lungs:  Breathing-non-labored, Good air entry bilaterally, breath sounds normal without rales, rhonchi.  There is mild wheezing  CVS: S1 S2 regular, no murmurs, gallops, rubs  Extremities: Bilateral Lower Ext shows no edema, both legs are warm to touch with = pulse throughout Neurology:  CN II-XII grossly intact, Non focal.   Psych:  TP linear. J/I WNL. Normal speech. Appropriate eye contact and affect.  Skin:  No Rash  Data Review Lab Results  Component Value Date   HGBA1C 5.7 04/06/2017   HGBA1C 5.9 01/15/2016   HGBA1C 5.9 08/14/2014     Assessment & Plan   1. Chronic bronchitis, unspecified chronic bronchitis type (HCC) - albuterol (PROVENTIL HFA;VENTOLIN HFA) 108 (90 Base) MCG/ACT inhaler; Inhale 2 puffs into the lungs every 6 (six) hours as needed for up to 14 days for wheezing or shortness of breath.  Dispense: 54 g; Refill: 0 - Fluticasone-Salmeterol (ADVAIR) 250-50 MCG/DOSE AEPB; Inhale 1 puff into the lungs 2 (two) times daily for 14 days.  Dispense: 180 each; Refill: 1 - benzonatate (TESSALON) 100 MG capsule; Take 1 capsule (100 mg total) by mouth 2 (two) times daily as needed for cough.  Dispense: 20 capsule; Refill: 0 - albuterol (PROVENTIL) (2.5 MG/3ML) 0.083% nebulizer solution; Take 3 mLs (2.5 mg total) by nebulization every 6 (six) hours as needed for wheezing or shortness of breath.  Dispense: 150 mL; Refill: 1  2. Essential hypertension Controlled-continue current regimen  3. Encounter for examination following treatment at hospital much  improved     Patient have been counseled extensively about nutrition and exercise  Return in about 1 month (around 12/09/2017) for assign PCP; recheck hypertension, lipids, COPD.  The patient was given clear instructions to go to ER or return to medical center if symptoms don't improve, worsen or new problems develop. The patient verbalized understanding. The patient was told to call to get lab results if they haven't heard anything in the next week.     Freeman Caldron, PA-C [facility] and [facility] [place], [place]   11/08/2017, 11:15 AM

## 2017-11-17 ENCOUNTER — Institutional Professional Consult (permissible substitution): Payer: BLUE CROSS/BLUE SHIELD | Admitting: Pulmonary Disease

## 2017-12-01 ENCOUNTER — Telehealth: Payer: Self-pay | Admitting: Pulmonary Disease

## 2017-12-01 ENCOUNTER — Encounter: Payer: Self-pay | Admitting: Pulmonary Disease

## 2017-12-01 ENCOUNTER — Ambulatory Visit (INDEPENDENT_AMBULATORY_CARE_PROVIDER_SITE_OTHER): Payer: BLUE CROSS/BLUE SHIELD | Admitting: Pulmonary Disease

## 2017-12-01 VITALS — BP 164/90 | HR 71 | Ht 67.0 in | Wt 239.0 lb

## 2017-12-01 DIAGNOSIS — R911 Solitary pulmonary nodule: Secondary | ICD-10-CM

## 2017-12-01 DIAGNOSIS — R0602 Shortness of breath: Secondary | ICD-10-CM

## 2017-12-01 DIAGNOSIS — R0600 Dyspnea, unspecified: Secondary | ICD-10-CM | POA: Diagnosis not present

## 2017-12-01 MED ORDER — BENZONATATE 200 MG PO CAPS: 200.0000 mg | ORAL_CAPSULE | Freq: Three times a day (TID) | ORAL | 1 refills | Status: DC | PRN

## 2017-12-01 MED ORDER — FLUTTER DEVI: 0 refills | Status: AC

## 2017-12-01 NOTE — Telephone Encounter (Signed)
Attempted to call Krystal Allen at [place] but the pharmacy was currently closed.  Tried to call pt to let her know this information but pt did not answer.  Left a message with pt to return our call on Monday to verify if there is another pharmacy other than [facility] that we could try to see if they had the 200mg  tessalon in stock.  Will also check with Dr. Vaughan Browner Monday to see if it is possible to change the dose of the tessalon since the 200mg  is not in stock and they only carry 100mg .  Am going to go ahead and route this to PM.

## 2017-12-01 NOTE — Patient Instructions (Signed)
We will schedule you for pulmonary function test. Use Mucinex over-the-counter.  We will give flutter valve for clearance of secretion We will prescribe Tessalon Perles for cough Continue the Advair and albuterol inhalers Follow-up in 1-2 months.

## 2017-12-01 NOTE — Progress Notes (Signed)
Krystal Allen    [id number]    [birth year]  Primary Care Physician:Funches, Adriana Mccallum, MD  Referring Physician: Boykin Nearing, MD No address on file  Chief complaint:  Consult for COPD.  HPI:  62 year old with history of COPD, active smoker.  She had been maintained on Advair for many years but had stopped taking it about a year ago. Seen in the ED on October 22, 2018 for cough, congestion.  Chest x-ray at that time was negative.  She was restarted on Advair and albuterol.  Given prednisone and azithromycin.  Seen back in the ED on December 27 for unchanged symptoms.  She is prescribed hydrocodone with improvement. Referred to pulmonary clinic for further evaluation.  She is using her Advair on a regular basis and albuterol 2 times a day.  She reports productive cough with white mucus, dyspnea on exertion.  Denies any wheezing, chills.  Pets: Has a dog.  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 Exposures: Exposure to cotton dust.  No exposure to asbestos.  No mold, hot tubs. Smoking history: Smoked about 1 pack/day for 40 years.  She is trying to quit and is down to 2-3 cigarettes/day.   Travel History:Not significant.  Outpatient Encounter Medications as of 12/01/2017  Medication Sig  . acetaminophen (TYLENOL 8 HOUR) 650 MG CR tablet Take 1 tablet (650 mg total) by mouth 2 (two) times daily as needed for pain.  Marland Kitchen albuterol (PROVENTIL) (2.5 MG/3ML) 0.083% nebulizer solution Take 3 mLs (2.5 mg total) by nebulization every 6 (six) hours as needed for wheezing or shortness of breath.  Marland Kitchen amLODipine (NORVASC) 10 MG tablet TAKE 1 TABLET BY MOUTH DAILY.  Marland Kitchen aspirin EC 81 MG tablet Take 1 tablet (81 mg total) by mouth daily.  Marland Kitchen atorvastatin (LIPITOR) 40 MG tablet Take 1 tablet (40 mg total) by mouth daily.  . benzonatate (TESSALON) 100 MG capsule Take 1 capsule (100 mg total) by mouth 2 (two) times daily as needed for cough.  . gabapentin  (NEURONTIN) 100 MG capsule Take 1 capsule (100 mg total) by mouth 3 (three) times daily.  Marland Kitchen terbinafine (LAMISIL) 250 MG tablet Take 1 tablet (250 mg total) by mouth daily.  Marland Kitchen albuterol (PROVENTIL HFA;VENTOLIN HFA) 108 (90 Base) MCG/ACT inhaler Inhale 2 puffs into the lungs every 6 (six) hours as needed for up to 14 days for wheezing or shortness of breath.  . Fluticasone-Salmeterol (ADVAIR) 250-50 MCG/DOSE AEPB Inhale 1 puff into the lungs 2 (two) times daily for 14 days.   No facility-administered encounter medications on file as of 12/01/2017.     Allergies as of 12/01/2017 - Review Complete 12/01/2017  Allergen Reaction Noted  . Sulfa antibiotics Anaphylaxis and Hives 01/16/2012  . Fish allergy Other (See Comments) 07/23/2012  . Penicillins Other (See Comments) 01/16/2012  . Latex Rash and Other (See Comments) 07/01/2014    Past Medical History:  Diagnosis Date  . COPD (chronic obstructive pulmonary disease) ([place])   . Depression   . GERD (gastroesophageal reflux disease)   . Hernia, abdominal   . History of chicken pox   . Hyperlipidemia   . Hypertension   . Internal hemorrhoids with Grade 3 prolapse and bleeding 06/19/2013  . Osteoarthritis   . Personal history of colonic adenomas 06/25/2013    Past Surgical History:  Procedure Laterality Date  . ABDOMINAL HYSTERECTOMY  38yrs ago   due to heavy bleeding and fibroids   .  COLONOSCOPY    . HEMORRHOID BANDING  2014  . HEMORRHOID SURGERY  1yrs agao  . UMBILICAL HERNIA REPAIR  06/2015    Family History  Problem Relation Age of Onset  . Cancer Father        prostate  . Hypertension Mother   . Diabetes Mother   . Hypertension Sister   . Heart disease Maternal Uncle   . Stroke Maternal Grandmother   . Hypertension Maternal Grandmother   . Hypertension Sister   . Colon cancer Neg Hx     Social History   Socioeconomic History  . Marital status: Married    Spouse name: Not on file  . Number of children: Not on file  .  Years of education: 12+  . Highest education level: Not on file  Social Needs  . Financial resource strain: Not on file  . Food insecurity - worry: Not on file  . Food insecurity - inability: Not on file  . Transportation needs - medical: Not on file  . Transportation needs - non-medical: Not on file  Occupational History  . Occupation: PCA    Employer: [employer]  Tobacco Use  . Smoking status: Current Every Day Smoker    Packs/day: 0.25    Types: Cigarettes  . Smokeless tobacco: Never Used  . Tobacco comment: smokes 3 cigarettes daily- 12/01/17  Substance and Sexual Activity  . Alcohol use: Yes    Alcohol/week: 0.0 oz    Comment: occasional  . Drug use: No  . Sexual activity: Not on file  Other Topics Concern  . Not on file  Social History Narrative   Regular exercise-no   Caffeine Use-    Review of systems: Review of Systems  Constitutional: Negative for fever and chills.  HENT: Negative.   Eyes: Negative for blurred vision.  Respiratory: as per HPI  Cardiovascular: Negative for chest pain and palpitations.  Gastrointestinal: Negative for vomiting, diarrhea, blood per rectum. Genitourinary: Negative for dysuria, urgency, frequency and hematuria.  Musculoskeletal: Negative for myalgias, back pain and joint pain.  Skin: Negative for itching and rash.  Neurological: Negative for dizziness, tremors, focal weakness, seizures and loss of consciousness.  Endo/Heme/Allergies: Negative for environmental allergies.  Psychiatric/Behavioral: Negative for depression, suicidal ideas and hallucinations.  All other systems reviewed and are negative.  Physical Exam: Blood pressure (!) 164/90, pulse 71, height 5\' 7"  (1.702 m), weight 239 lb (108.4 kg), SpO2 96 %. Gen:      No acute distress HEENT:  EOMI, sclera anicteric Neck:     No masses; no thyromegaly Lungs:    Clear to auscultation bilaterally; normal respiratory effort CV:         Regular rate and rhythm; no murmurs Abd:       + bowel sounds; soft, non-tender; no palpable masses, no distension Ext:    No edema; adequate peripheral perfusion Skin:      Warm and dry; no rash Neuro: alert and oriented x 3 Psych: normal mood and affect  Data Reviewed: CBC 04/13/17-WBC 6.9, eos 3%, absolute eosinophil count 200.  Chest x-ray 10/22/17-mild increase in interstitial markings.  No infiltrate or pneumonia CT chest 01/21/16- subcentimeter pulmonary nodules bilaterally.  Mild emphysematous changes, bronchial wall thickening.  Scattered cystic areas in the upper lobe.  I have reviewed the images personally.  Assessment:  Emphysema Likely has COPD based on CT changes and smoking history.  Schedule pulmonary function test for further evaluation.  Continue albuterol PRN and Advair. Mucinex, flutter wall  and Tessalon for cough  Abnormal CT scan CT scan reviewed showing subcentimeter pulmonary nodules, there are cystic changes in the upper lobe which may represent smoking-related interstitial lung disease versus emphysematous bullae Get a high-resolution CT.  Encouraged to quit smoking completely.  Plan/Recommendations: - PFTs, High res CT - Continue advair, albuterol - Mucinex, flutter valve, tessalon - Smoking cessation  Marshell Garfinkel MD [facility] Pager [phone] 12/01/2017, 3:55 PM  CC: Boykin Nearing, MD

## 2017-12-02 NOTE — Telephone Encounter (Signed)
Ok to use tessalon 100 mg.  Please let her know that I would like her to get a high resolution CT to follow lung nodules and interstitial changes on last CT scan. Please order high res CT. Thanks.  Marshell Garfinkel MD [facility] Pager [phone] 12/02/2017, 6:32 AM

## 2017-12-04 MED ORDER — BENZONATATE 100 MG PO CAPS: 100.0000 mg | ORAL_CAPSULE | Freq: Three times a day (TID) | ORAL | 1 refills | Status: DC | PRN

## 2017-12-04 NOTE — Telephone Encounter (Signed)
Spoke with pt. She is aware of PM's message. Rx has been sent in for Tessalon, pt asked to have this sent to [facility]. Order has been placed for CT. Nothing further was needed.

## 2017-12-05 ENCOUNTER — Other Ambulatory Visit: Payer: Self-pay | Admitting: Physician Assistant

## 2017-12-05 DIAGNOSIS — J42 Unspecified chronic bronchitis: Secondary | ICD-10-CM

## 2017-12-06 ENCOUNTER — Other Ambulatory Visit: Payer: BLUE CROSS/BLUE SHIELD

## 2017-12-11 ENCOUNTER — Other Ambulatory Visit: Payer: BLUE CROSS/BLUE SHIELD

## 2017-12-15 ENCOUNTER — Ambulatory Visit (INDEPENDENT_AMBULATORY_CARE_PROVIDER_SITE_OTHER)
Admission: RE | Admit: 2017-12-15 | Discharge: 2017-12-15 | Disposition: A | Discharge status: Home / Self Care | Payer: BLUE CROSS/BLUE SHIELD | Source: Ambulatory Visit | Attending: Pulmonary Disease | Admitting: Pulmonary Disease

## 2017-12-15 DIAGNOSIS — R911 Solitary pulmonary nodule: Secondary | ICD-10-CM

## 2017-12-22 ENCOUNTER — Encounter: Payer: Self-pay | Admitting: Internal Medicine

## 2017-12-22 ENCOUNTER — Ambulatory Visit: Payer: BLUE CROSS/BLUE SHIELD | Attending: Internal Medicine | Admitting: Internal Medicine

## 2017-12-22 VITALS — BP 135/80 | HR 60 | Temp 98.1°F | Resp 16 | Ht 67.0 in | Wt 237.4 lb

## 2017-12-22 DIAGNOSIS — Z90710 Acquired absence of both cervix and uterus: Secondary | ICD-10-CM | POA: Diagnosis not present

## 2017-12-22 DIAGNOSIS — Z91013 Allergy to seafood: Secondary | ICD-10-CM | POA: Diagnosis not present

## 2017-12-22 DIAGNOSIS — K219 Gastro-esophageal reflux disease without esophagitis: Secondary | ICD-10-CM | POA: Diagnosis not present

## 2017-12-22 DIAGNOSIS — Z8042 Family history of malignant neoplasm of prostate: Secondary | ICD-10-CM | POA: Diagnosis not present

## 2017-12-22 DIAGNOSIS — Z880 Allergy status to penicillin: Secondary | ICD-10-CM | POA: Insufficient documentation

## 2017-12-22 DIAGNOSIS — Z823 Family history of stroke: Secondary | ICD-10-CM | POA: Diagnosis not present

## 2017-12-22 DIAGNOSIS — I10 Essential (primary) hypertension: Secondary | ICD-10-CM

## 2017-12-22 DIAGNOSIS — J449 Chronic obstructive pulmonary disease, unspecified: Secondary | ICD-10-CM

## 2017-12-22 DIAGNOSIS — Z7982 Long term (current) use of aspirin: Secondary | ICD-10-CM | POA: Diagnosis not present

## 2017-12-22 DIAGNOSIS — Z8249 Family history of ischemic heart disease and other diseases of the circulatory system: Secondary | ICD-10-CM | POA: Diagnosis not present

## 2017-12-22 DIAGNOSIS — M47812 Spondylosis without myelopathy or radiculopathy, cervical region: Secondary | ICD-10-CM

## 2017-12-22 DIAGNOSIS — R7303 Prediabetes: Secondary | ICD-10-CM | POA: Diagnosis not present

## 2017-12-22 DIAGNOSIS — Z79899 Other long term (current) drug therapy: Secondary | ICD-10-CM | POA: Diagnosis not present

## 2017-12-22 DIAGNOSIS — Z882 Allergy status to sulfonamides status: Secondary | ICD-10-CM | POA: Insufficient documentation

## 2017-12-22 DIAGNOSIS — E785 Hyperlipidemia, unspecified: Secondary | ICD-10-CM | POA: Diagnosis not present

## 2017-12-22 DIAGNOSIS — F17210 Nicotine dependence, cigarettes, uncomplicated: Secondary | ICD-10-CM | POA: Insufficient documentation

## 2017-12-22 MED ORDER — OMEPRAZOLE 20 MG PO CPDR: 20.0000 mg | DELAYED_RELEASE_CAPSULE | Freq: Every day | ORAL | 3 refills | Status: DC

## 2017-12-22 MED ORDER — AMLODIPINE BESYLATE 10 MG PO TABS: 10.0000 mg | ORAL_TABLET | Freq: Every day | ORAL | 0 refills | Status: DC

## 2017-12-22 NOTE — Patient Instructions (Signed)

## 2017-12-22 NOTE — Progress Notes (Signed)
Patient ID: Krystal Allen, female    DOB: 1956-02-11  MRN: [id number]  CC: re-establish; Hypertension; COPD; and Gastroesophageal Reflux   Subjective: Krystal Allen is a 62 y.o. female who presents for chronic ds management and est with me as PCP.  Last saw Dr. Adrian Blackwater 04/2017 Her concerns today include:  Pt with hx of HTN, HL, COPD, tob dep, DJD c-spine, spinal stenosis  1.  HTN: -no device to check BP -takes Norvasc daily -does not use salt in her food No CP/LE edema.  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   2. COPD -followed by Dr. Vaughan Browner whom she saw recently.  High-resolution CT scan and PFTs ordered.  I saw the results of the CT and went over it briefly with her.  She will have a more detailed discussion with her pulmonologist -Reports compliance with Advair.  Uses albuterol as needed.  she feels her breathing is better now that she is gotten over a cold  Tried Chantix in past but stopped due to bad dreams.  Nicotine patches caused rash -down to 3 cig/day from 1/2 pk a day.   3.  HL:  Forgets to take the Lipitor  4.  DJD:  Doing ok on Tylenol PRN.  Not taking Gabapentin  5. Pre-DM:  A1C of 5.7 03/2017.  Patient states she was aware of the diagnosis and wanted to know more information about it.  Strong family history of diabetes.  6. GERD: Complains of flareup of heartburn over the past several weeks.  Feels like food gets stuck in the lower chest associated with a lot of burping.similar symptoms a few years ago which resolved with omeprazole.  No nausea or vomiting.  No significant weight changes. Patient Active Problem List   Diagnosis Date Noted  . Cervical radiculopathy 05/19/2017  . Onychomycosis of toenail 05/19/2017  . Pain of left calf 04/06/2017  . COPD (chronic obstructive pulmonary disease) ([place]) 02/23/2016  . Enlarged tonsils 02/23/2016  . Obesity (BMI 30-39.9) 01/15/2016  . Bilateral shoulder pain  01/15/2016  . Esophageal reflux 01/15/2016  . Tobacco abuse 05/13/2015  . Lung nodule 08/14/2014  . Impaired fasting glucose 08/14/2014  . Hyperlipidemia 08/14/2014  . Personal history of colonic adenomas 06/25/2013  . HTN (hypertension) 05/20/2013     Current Outpatient Medications on File Prior to Visit  Medication Sig Dispense Refill  . acetaminophen (TYLENOL 8 HOUR) 650 MG CR tablet Take 1 tablet (650 mg total) by mouth 2 (two) times daily as needed for pain. 60 tablet 5  . albuterol (PROVENTIL HFA;VENTOLIN HFA) 108 (90 Base) MCG/ACT inhaler Inhale 2 puffs into the lungs every 6 (six) hours as needed for up to 14 days for wheezing or shortness of breath. 54 g 0  . albuterol (PROVENTIL) (2.5 MG/3ML) 0.083% nebulizer solution Take 3 mLs (2.5 mg total) by nebulization every 6 (six) hours as needed for wheezing or shortness of breath. 150 mL 1  . aspirin EC 81 MG tablet Take 1 tablet (81 mg total) by mouth daily. 30 tablet 11  . atorvastatin (LIPITOR) 40 MG tablet Take 1 tablet (40 mg total) by mouth daily. 30 tablet 11  . Fluticasone-Salmeterol (ADVAIR) 250-50 MCG/DOSE AEPB Inhale 1 puff into the lungs 2 (two) times daily for 14 days. 180 each 1  . gabapentin (NEURONTIN) 100 MG capsule Take 1 capsule (100 mg total) by mouth 3 (three) times daily. 90 capsule 1  .  Respiratory Therapy Supplies (FLUTTER) DEVI Use as directed 1 each 0   No current facility-administered medications on file prior to visit.     Allergies  Allergen Reactions  . Sulfa Antibiotics Anaphylaxis and Hives  . Chantix [Varenicline Tartrate]     Bad dreams  . Fish Allergy Other (See Comments)    Severe stomach cramps   . Penicillins Other (See Comments)    Has patient had a PCN reaction causing immediate rash, facial/tongue/throat swelling, SOB or lightheadedness with hypotension: No Has patient had a PCN reaction causing severe rash involving mucus membranes or skin necrosis: No Has patient had a PCN reaction that  required hospitalization No Has patient had a PCN reaction occurring within the last 10 years: No If all of the above answers are "NO", then may proceed with Cephalosporin use.  Welts   . Latex Rash and Other (See Comments)    Dry skin    Social History   Socioeconomic History  . Marital status: Married    Spouse name: Not on file  . Number of children: Not on file  . Years of education: 12+  . Highest education level: Not on file  Social Needs  . Financial resource strain: Not on file  . Food insecurity - worry: Not on file  . Food insecurity - inability: Not on file  . Transportation needs - medical: Not on file  . Transportation needs - non-medical: Not on file  Occupational History  . Occupation: PCA    Employer: [employer]  Tobacco Use  . Smoking status: Current Every Day Smoker    Packs/day: 0.25    Types: Cigarettes  . Smokeless tobacco: Never Used  . Tobacco comment: smokes 3 cigarettes daily- 12/01/17  Substance and Sexual Activity  . Alcohol use: Yes    Alcohol/week: 0.0 oz    Comment: occasional  . Drug use: No  . Sexual activity: Not on file  Other Topics Concern  . Not on file  Social History Narrative   Regular exercise-no   Caffeine Use-    Family History  Problem Relation Age of Onset  . Cancer Father        prostate  . Hypertension Mother   . Diabetes Mother   . Hypertension Sister   . Heart disease Maternal Uncle   . Stroke Maternal Grandmother   . Hypertension Maternal Grandmother   . Hypertension Sister   . Colon cancer Neg Hx     Past Surgical History:  Procedure Laterality Date  . ABDOMINAL HYSTERECTOMY  42yrs ago   due to heavy bleeding and fibroids   . COLONOSCOPY    . HEMORRHOID BANDING  2014  . HEMORRHOID SURGERY  13yrs agao  . UMBILICAL HERNIA REPAIR  06/2015    ROS: Review of Systems Negative except as stated above. PHYSICAL EXAM: BP 135/80   Pulse 60   Temp 98.1 F (36.7 C) (Oral)   Resp 16   Ht 5\' 7"  (1.702 m)    Wt 237 lb 6.4 oz (107.7 kg)   SpO2 95%   BMI 37.18 kg/m   Wt Readings from Last 3 Encounters:  12/22/17 237 lb 6.4 oz (107.7 kg)  12/01/17 239 lb (108.4 kg)  11/08/17 238 lb 9.6 oz (108.2 kg)    Physical Exam  General appearance - alert, well appearing, older African-American female and in no distress Mental status - alert, oriented to person, place, and time, normal mood, behavior, speech, dress, motor activity, and thought processes Nose -  normal and patent, no erythema, discharge or polyps Mouth - mucous membranes moist, pharynx normal without lesions Neck - supple, no significant adenopathy Chest - clear to auscultation, no wheezes, rales or rhonchi, symmetric air entry Heart - normal rate, regular rhythm, normal S1, S2, no murmurs, rubs, clicks or gallops Extremities - peripheral pulses normal, no pedal edema, no clubbing or cyanosis     Chemistry      Component Value Date/Time   NA 143 04/13/2017 1447   NA 146 (H) 04/06/2017 1152   K 4.0 04/13/2017 1447   CL 108 04/13/2017 1447   CO2 24 04/13/2017 1437   BUN 21 (H) 04/13/2017 1447   BUN 13 04/06/2017 1152   CREATININE 1.00 04/13/2017 1447   CREATININE 1.22 (H) 01/15/2016 1000      Component Value Date/Time   CALCIUM 9.1 04/13/2017 1437   ALKPHOS 95 04/13/2017 1437   AST 21 04/13/2017 1437   ALT 21 04/13/2017 1437   BILITOT 0.2 (L) 04/13/2017 1437   BILITOT <0.2 04/06/2017 1152     Lab Results  Component Value Date   WBC 6.9 04/13/2017   HGB 14.6 04/13/2017   HCT 43.0 04/13/2017   MCV 88.9 04/13/2017   PLT 236 04/13/2017   Lab Results  Component Value Date   HGBA1C 5.7 04/06/2017   Lab Results  Component Value Date   CHOL 213 (H) 04/06/2017   HDL 46 04/06/2017   LDLCALC 149 (H) 04/06/2017   LDLDIRECT 190.1 04/16/2013   TRIG 91 04/06/2017   CHOLHDL 4.6 (H) 04/06/2017     ASSESSMENT AND PLAN: 1. Essential hypertension -Not at goal of less than 130/80.  However she has not taken amlodipine as yet  for the morning.  She will take once she returns home. - amLODipine (NORVASC) 10 MG tablet; Take 1 tablet (10 mg total) by mouth daily.  Dispense: 30 tablet; Refill: 0  2. Chronic obstructive pulmonary disease, unspecified COPD type ([place]) -Continue current inhalers.  Keep follow-up visit with Dr. Vaughan Browner to discuss high-resolution CT results  3. Hyperlipidemia, unspecified hyperlipidemia type -Encourage her to put an elastic band around her bottles of amlodipine and atorvastatin so that she remembers to take both at the same time.  Went over the last lipid profile with her  4. Gastroesophageal reflux disease without esophagitis GERD precautions discussed. Start amlodipine Let me know if symptoms do not resolve after being on amlodipine for 2 weeks at which point we will refer to GI for endoscopy - omeprazole (PRILOSEC) 20 MG capsule; Take 1 capsule (20 mg total) by mouth daily.  Dispense: 30 capsule; Refill: 3  5. Osteoarthritis of cervical spine, unspecified spinal osteoarthritis complication status Doing well on over-the-counter Tylenol as needed  6. Prediabetes Discussed importance of healthy eating habits and regular physical activity to prevent progression to diabetes.  Patient was given the opportunity to ask questions.  Patient verbalized understanding of the plan and was able to repeat key elements of the plan.   No orders of the defined types were placed in this encounter.    Requested Prescriptions   Signed Prescriptions Disp Refills  . amLODipine (NORVASC) 10 MG tablet 30 tablet 0    Sig: Take 1 tablet (10 mg total) by mouth daily.  Marland Kitchen omeprazole (PRILOSEC) 20 MG capsule 30 capsule 3    Sig: Take 1 capsule (20 mg total) by mouth daily.    Return in about 3 months (around 03/21/2018).  Karle Plumber, MD, FACP

## 2018-01-01 MED FILL — OMEPRAZOLE DR 20 MG CAPSULE: 20 | 30 days supply | Qty: 30 | Fill #0

## 2018-01-01 MED FILL — AMLODIPINE BESYLATE 10 MG T: 10 | 30 days supply | Qty: 30 | Fill #0

## 2018-01-26 ENCOUNTER — Encounter: Payer: Self-pay | Admitting: Pulmonary Disease

## 2018-01-26 ENCOUNTER — Ambulatory Visit (INDEPENDENT_AMBULATORY_CARE_PROVIDER_SITE_OTHER): Payer: BLUE CROSS/BLUE SHIELD | Admitting: Pulmonary Disease

## 2018-01-26 VITALS — BP 130/64 | HR 69 | Ht 68.0 in | Wt 236.0 lb

## 2018-01-26 DIAGNOSIS — R0602 Shortness of breath: Secondary | ICD-10-CM

## 2018-01-26 DIAGNOSIS — J849 Interstitial pulmonary disease, unspecified: Secondary | ICD-10-CM

## 2018-01-26 DIAGNOSIS — J439 Emphysema, unspecified: Secondary | ICD-10-CM

## 2018-01-26 DIAGNOSIS — F17210 Nicotine dependence, cigarettes, uncomplicated: Secondary | ICD-10-CM

## 2018-01-26 DIAGNOSIS — C966 Unifocal Langerhans-cell histiocytosis: Secondary | ICD-10-CM | POA: Insufficient documentation

## 2018-01-26 LAB — PULMONARY FUNCTION TEST
DL/VA % PRED: 61 %
DL/VA: 3.23 ml/min/mmHg/L
DLCO UNC: 15.79 ml/min/mmHg
DLCO unc % pred: 53 %
FEF 25-75 POST: 2.24 L/s
FEF 25-75 Pre: 2.42 L/sec
FEF2575-%Change-Post: -7 %
FEF2575-%Pred-Post: 97 %
FEF2575-%Pred-Pre: 105 %
FEV1-%Change-Post: 0 %
FEV1-%PRED-POST: 103 %
FEV1-%PRED-PRE: 104 %
FEV1-Post: 2.49 L
FEV1-Pre: 2.51 L
FEV1FVC-%Change-Post: 5 %
FEV1FVC-%Pred-Pre: 100 %
FEV6-%CHANGE-POST: -5 %
FEV6-%PRED-PRE: 107 %
FEV6-%Pred-Post: 100 %
FEV6-Post: 2.99 L
FEV6-Pre: 3.18 L
FEV6FVC-%PRED-POST: 103 %
FEV6FVC-%Pred-Pre: 103 %
FVC-%Change-Post: -5 %
FVC-%Pred-Post: 97 %
FVC-%Pred-Pre: 103 %
FVC-POST: 2.99 L
FVC-Pre: 3.18 L
POST FEV6/FVC RATIO: 100 %
PRE FEV1/FVC RATIO: 79 %
Post FEV1/FVC ratio: 83 %
Pre FEV6/FVC Ratio: 100 %
RV % pred: 85 %
RV: 1.91 L
TLC % PRED: 88 %
TLC: 5.02 L

## 2018-01-26 MED ORDER — NICOTINE 7 MG/24HR TD PT24: 7.0000 mg | MEDICATED_PATCH | Freq: Every day | TRANSDERMAL | 0 refills | Status: DC

## 2018-01-26 MED FILL — NICOTINE 7 MG/24HR PATCH: 7 | 28 days supply | Qty: 28 | Fill #0

## 2018-01-26 NOTE — Progress Notes (Signed)
Krystal Allen    [id number]    Jan 17, 1956  Primary Care Physician:Johnson, Dalbert Batman, MD  Referring Physician: Boykin Nearing, MD No address on file  Chief complaint:   Follow-up for COPD Interstitial lung disease, likely Langerhans cell histiocytosis Active smoker  HPI:  62 year old with history of COPD, active smoker.  She had been maintained on Advair for many years but had stopped taking it about a year ago. Seen in the ED on October 22, 2018 for cough, congestion.  Chest x-ray at that time was negative.  She was restarted on Advair and albuterol.  Given prednisone and azithromycin.  Seen back in the ED on December 27 for unchanged symptoms.  She is prescribed hydrocodone with improvement. Referred to pulmonary clinic for further evaluation.  She is using her Advair on a regular basis and albuterol 2 times a day.  She reports productive cough with white mucus, dyspnea on exertion.  Denies any wheezing, chills.  Pets: Has a dog.  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  No exposure to asbestos.  No mold, hot tubs. Smoking history: Smoked about 1 pack/day for 40 years.  She is trying to quit and is down to 2-3 cigarettes/day.   Travel History:Not significant.  Interim history: She is here for review of CT scan and pulmonary function test.  Continues on Advair.  Dyspnea on exertion cough with chronic mucus is stable.  Outpatient Encounter Medications as of 01/26/2018  Medication Sig  . acetaminophen (TYLENOL 8 HOUR) 650 MG CR tablet Take 1 tablet (650 mg total) by mouth 2 (two) times daily as needed for pain.  Marland Kitchen albuterol (PROVENTIL) (2.5 MG/3ML) 0.083% nebulizer solution Take 3 mLs (2.5 mg total) by nebulization every 6 (six) hours as needed for wheezing or shortness of breath.  Marland Kitchen amLODipine (NORVASC) 10 MG tablet Take 1 tablet (10 mg total) by mouth daily.  Marland Kitchen aspirin EC 81  MG tablet Take 1 tablet (81 mg total) by mouth daily.  Marland Kitchen atorvastatin (LIPITOR) 40 MG tablet Take 1 tablet (40 mg total) by mouth daily.  Marland Kitchen omeprazole (PRILOSEC) 20 MG capsule Take 1 capsule (20 mg total) by mouth daily.  Marland Kitchen Respiratory Therapy Supplies (FLUTTER) DEVI Use as directed  . albuterol (PROVENTIL HFA;VENTOLIN HFA) 108 (90 Base) MCG/ACT inhaler Inhale 2 puffs into the lungs every 6 (six) hours as needed for up to 14 days for wheezing or shortness of breath.  . Fluticasone-Salmeterol (ADVAIR) 250-50 MCG/DOSE AEPB Inhale 1 puff into the lungs 2 (two) times daily for 14 days.   No facility-administered encounter medications on file as of 01/26/2018.     Allergies as of 01/26/2018 - Review Complete 01/26/2018  Allergen Reaction Noted  . Sulfa antibiotics Anaphylaxis and Hives 01/16/2012  . Chantix [varenicline tartrate]  12/22/2017  . Fish allergy Other (See Comments) 07/23/2012  . Penicillins Other (See Comments) 01/16/2012  . Latex Rash and Other (See Comments) 07/01/2014    Past Medical History:  Diagnosis Date  . COPD (chronic obstructive pulmonary disease) ([place])   . Depression   . GERD (gastroesophageal reflux disease)   . Hernia, abdominal   . History of chicken pox   . Hyperlipidemia   . Hypertension   . Internal hemorrhoids with Grade 3 prolapse and bleeding 06/19/2013  . Osteoarthritis   . Personal history of colonic adenomas 06/25/2013    Past Surgical  History:  Procedure Laterality Date  . ABDOMINAL HYSTERECTOMY  27yrs ago   due to heavy bleeding and fibroids   . COLONOSCOPY    . HEMORRHOID BANDING  2014  . HEMORRHOID SURGERY  66yrs agao  . UMBILICAL HERNIA REPAIR  06/2015    Family History  Problem Relation Age of Onset  . Cancer Father        prostate  . Hypertension Mother   . Diabetes Mother   . Hypertension Sister   . Heart disease Maternal Uncle   . Stroke Maternal Grandmother   . Hypertension Maternal Grandmother   . Hypertension Sister   .  Colon cancer Neg Hx     Social History   Socioeconomic History  . Marital status: Married    Spouse name: Not on file  . Number of children: Not on file  . Years of education: 12+  . Highest education level: Not on file  Occupational History  . Occupation: PCA    Employer: [employer]  . Financial resource strain: Not on file  . Food insecurity:    Worry: Not on file    Inability: Not on file  . Transportation needs:    Medical: Not on file    Non-medical: Not on file  Tobacco Use  . Smoking status: Current Every Day Smoker    Packs/day: 0.25    Types: Cigarettes  . Smokeless tobacco: Never Used  . Tobacco comment: smokes 3 cigarettes daily- 12/01/17  Substance and Sexual Activity  . Alcohol use: Yes    Alcohol/week: 0.0 oz    Comment: occasional  . Drug use: No  . Sexual activity: Not on file  Lifestyle  . Physical activity:    Days per week: Not on file    Minutes per session: Not on file  . Stress: Not on file  Relationships  . Social connections:    Talks on phone: Not on file    Gets together: Not on file    Attends religious service: Not on file    Active member of club or organization: Not on file    Attends meetings of clubs or organizations: Not on file    Relationship status: Not on file  . Intimate partner violence:    Fear of current or ex partner: Not on file    Emotionally abused: Not on file    Physically abused: Not on file    Forced sexual activity: Not on file  Other Topics Concern  . Not on file  Social History Narrative   Regular exercise-no   Caffeine Use-    Review of systems: Review of Systems  Constitutional: Negative for fever and chills.  HENT: Negative.   Eyes: Negative for blurred vision.  Respiratory: as per HPI  Cardiovascular: Negative for chest pain and palpitations.  Gastrointestinal: Negative for vomiting, diarrhea, blood per rectum. Genitourinary: Negative for dysuria, urgency, frequency and hematuria.    Musculoskeletal: Negative for myalgias, back pain and joint pain.  Skin: Negative for itching and rash.  Neurological: Negative for dizziness, tremors, focal weakness, seizures and loss of consciousness.  Endo/Heme/Allergies: Negative for environmental allergies.  Psychiatric/Behavioral: Negative for depression, suicidal ideas and hallucinations.  All other systems reviewed and are negative.  Physical Exam: Blood pressure 130/64, pulse 69, height 5\' 8"  (1.727 m), weight 236 lb (107 kg), SpO2 95 %. Gen:      No acute distress HEENT:  EOMI, sclera anicteric Neck:     No masses; no  thyromegaly Lungs:    Clear to auscultation bilaterally; normal respiratory effort CV:         Regular rate and rhythm; no murmurs Abd:      + bowel sounds; soft, non-tender; no palpable masses, no distension Ext:    No edema; adequate peripheral perfusion Skin:      Warm and dry; no rash Neuro: alert and oriented x 3 Psych: normal mood and affect  Data Reviewed: CBC 04/13/17-WBC 6.9, eos 3%, absolute eosinophil count 200.  Chest x-ray 10/22/17-mild increase in interstitial markings.  No infiltrate or pneumonia CT chest 01/21/16- subcentimeter pulmonary nodules bilaterally.  Mild emphysematous changes, bronchial wall thickening.  Scattered cystic areas in the upper lobe.  High resolution CT 12/15/17-stable subcentimeter pulmonary nodule, emphysematous changes, cystic changes in the upper lobe consistent with Langerhans cell histiocytosis.  Dilated pulmonary artery  I have reviewed the images personally.  PFTs 01/26/18 FVC 2.99 [97%], FEV1 2.49 [103%], F/F 83, TLC 88%, DLCO 53% Moderate-severe reduction in diffusion capacity.  Assessment:  Emphysema Has emphysematous changes but no obstruction on PFTs Continue on Advair and albuterol as needed Mucinex and flutter wall for clearance of secretion.  Abnormal CT scan CT scan reviewed showing subcentimeter pulmonary nodules, there are cystic changes in the  upper lobe which may represent smoking-related interstitial lung disease such as langerhan cell histiocytosis.  Treatment is smoking cessation.   Follow-up high-resolution CT in 1 year.  Smoking cessation Encouraged her to quit smoking completely given findings on CT scan.  Prescribe nicotine patches.  Time spent counseling-5 minutes  Suspected pulmonary hypertension Suggested by enlarged PA artery on CT scan and isolated reduction in diffusion capacity.  Order echocardiogram for evaluation.  Plan/Recommendations: - Continue advair, albuterol - Mucinex, flutter valve, tessalon - Smoking cessation, nicotine patches - Echocardiogram  Marshell Garfinkel MD [facility] Pager [phone] 01/26/2018, 11:35 AM  CC: Boykin Nearing, MD

## 2018-01-26 NOTE — Patient Instructions (Signed)
His CT scan shows changes consistent with emphysema and smoking related inflammation in the lung Please work on quitting smoking completely We will prescribe low-dose nicotine patches Will order an echocardiogram to evaluate for pulmonary hypertension. Follow-up in 3 months.

## 2018-01-26 NOTE — Progress Notes (Signed)
PFT done today. 

## 2018-01-31 MED FILL — ATORVASTATIN 40 MG TABLET: 40 | 30 days supply | Qty: 30 | Fill #3

## 2018-02-05 ENCOUNTER — Ambulatory Visit (HOSPITAL_COMMUNITY): Payer: BLUE CROSS/BLUE SHIELD | Attending: Pulmonary Disease

## 2018-02-05 ENCOUNTER — Other Ambulatory Visit: Payer: Self-pay

## 2018-02-05 DIAGNOSIS — J849 Interstitial pulmonary disease, unspecified: Secondary | ICD-10-CM | POA: Insufficient documentation

## 2018-02-05 DIAGNOSIS — E785 Hyperlipidemia, unspecified: Secondary | ICD-10-CM | POA: Insufficient documentation

## 2018-02-05 DIAGNOSIS — Z720 Tobacco use: Secondary | ICD-10-CM | POA: Insufficient documentation

## 2018-02-05 DIAGNOSIS — R0602 Shortness of breath: Secondary | ICD-10-CM

## 2018-02-05 DIAGNOSIS — I272 Pulmonary hypertension, unspecified: Secondary | ICD-10-CM | POA: Insufficient documentation

## 2018-02-05 DIAGNOSIS — I10 Essential (primary) hypertension: Secondary | ICD-10-CM | POA: Insufficient documentation

## 2018-02-05 DIAGNOSIS — J449 Chronic obstructive pulmonary disease, unspecified: Secondary | ICD-10-CM | POA: Insufficient documentation

## 2018-03-13 ENCOUNTER — Other Ambulatory Visit: Payer: Self-pay

## 2018-03-13 ENCOUNTER — Other Ambulatory Visit: Payer: Self-pay | Admitting: Internal Medicine

## 2018-03-13 DIAGNOSIS — I10 Essential (primary) hypertension: Secondary | ICD-10-CM

## 2018-03-13 MED ORDER — AMLODIPINE BESYLATE 10 MG PO TABS: 10.0000 mg | ORAL_TABLET | Freq: Every day | ORAL | 0 refills | Status: DC

## 2018-03-13 MED FILL — ATORVASTATIN CALCIUM 40 MG: 40 | 30 days supply | Qty: 30 | Fill #4

## 2018-03-13 MED FILL — AMLODIPINE BESYLATE 10 MG T: 10 | 30 days supply | Qty: 30 | Fill #0

## 2018-03-23 ENCOUNTER — Ambulatory Visit: Payer: BLUE CROSS/BLUE SHIELD | Admitting: Internal Medicine

## 2018-04-06 ENCOUNTER — Ambulatory Visit: Payer: Self-pay | Admitting: Internal Medicine

## 2018-07-04 MED FILL — AMLODIPINE BESYLATE 10 MG T: 10 | 30 days supply | Qty: 30 | Fill #0

## 2018-08-27 ENCOUNTER — Other Ambulatory Visit: Payer: Self-pay | Admitting: Family Medicine

## 2018-08-27 DIAGNOSIS — I10 Essential (primary) hypertension: Secondary | ICD-10-CM

## 2018-09-22 ENCOUNTER — Other Ambulatory Visit: Payer: Self-pay

## 2018-09-22 ENCOUNTER — Ambulatory Visit (HOSPITAL_COMMUNITY)
Admission: EM | Admit: 2018-09-22 | Discharge: 2018-09-22 | Disposition: A | Discharge status: Home / Self Care | Payer: 59 | Attending: Family Medicine | Admitting: Family Medicine

## 2018-09-22 ENCOUNTER — Encounter (HOSPITAL_COMMUNITY): Payer: Self-pay | Admitting: Emergency Medicine

## 2018-09-22 DIAGNOSIS — R42 Dizziness and giddiness: Secondary | ICD-10-CM

## 2018-09-22 DIAGNOSIS — I10 Essential (primary) hypertension: Secondary | ICD-10-CM

## 2018-09-22 DIAGNOSIS — K0889 Other specified disorders of teeth and supporting structures: Secondary | ICD-10-CM

## 2018-09-22 MED ORDER — CLINDAMYCIN HCL 300 MG PO CAPS: 300.0000 mg | ORAL_CAPSULE | Freq: Three times a day (TID) | ORAL | 0 refills | Status: DC

## 2018-09-22 MED ORDER — AMLODIPINE BESYLATE 10 MG PO TABS: 10.0000 mg | ORAL_TABLET | Freq: Every day | ORAL | 0 refills | Status: DC

## 2018-09-22 MED ORDER — IBUPROFEN 800 MG PO TABS: 800.0000 mg | ORAL_TABLET | Freq: Three times a day (TID) | ORAL | 0 refills | Status: DC

## 2018-09-22 NOTE — ED Triage Notes (Signed)
Dental pain for 2 days, bottom, right tooth.   Dizziness since tooth pain started.   Out of blood pressure medicine for 10-12.  No refills

## 2018-09-22 NOTE — ED Notes (Signed)
Refused to place on gown, "im ok"

## 2018-09-26 NOTE — ED Provider Notes (Signed)
[place]   [id number] 09/22/18 Arrival Time: 1937  ASSESSMENT & PLAN:  1. Pain, dental   2. Hypertension, uncontrolled   3. Essential hypertension   4. Lightheadedness    No sign of dental abscess requiring I&D at this time. Discussed.  Meds ordered this encounter  Medications  . amLODipine (NORVASC) 10 MG tablet    Sig: Take 1 tablet (10 mg total) by mouth daily.    Dispense:  30 tablet    Refill:  0  . ibuprofen (ADVIL,MOTRIN) 800 MG tablet    Sig: Take 1 tablet (800 mg total) by mouth 3 (three) times daily.    Dispense:  21 tablet    Refill:  0  . clindamycin (CLEOCIN) 300 MG capsule    Sig: Take 1 capsule (300 mg total) by mouth 3 (three) times daily.    Dispense:  30 capsule    Refill:  0   Dental resource written instructions given. She will schedule dental evaluation as soon as possible.  Recommend f/u with PCP to follow BP.  Reviewed expectations re: course of current medical issues. Questions answered. Outlined signs and symptoms indicating need for more acute intervention. Patient verbalized understanding. After Visit Summary given.   SUBJECTIVE:  Krystal Allen is a 62 y.o. female who reports gradual onset of right lower dental pain described as "aching". Present for 2-3 days. Afebrile. Tolerating PO intake but reports pain with chewing. Normal swallowing. She does not see a dentist regularly. No neck swelling or pain. OTC analgesics without relief. Occasional "dizzy feeling" described as lightheadedness when pain flares up. No vertigo. No headaches or visual changes. No extremity sensation changes or weakness. No n/v.  Also requests refill of HTN medication. Out for the past few weeks. No CP/SOB/LE edema/orthopnea/PND reported.  ROS: As per HPI. All other systems negative.   OBJECTIVE:  Vitals:   09/22/18 1112  BP: (!) 160/93  Pulse: 66  Resp: 20  Temp: 98.4 F (36.9 C)  TempSrc: Oral  SpO2: 96%    General appearance: alert; no  distress HENT: normocephalic; atraumatic; dentition: fair; mild erythema and tenderness to palpation over right lower gums without areas of fluctuance; normal jaw movement Neck: supple without LAD; FROM; trachea midline CV: RRR Lungs: normal respirations; unlabored Abd: soft; non-tender Skin: warm and dry Psychological: alert and cooperative; normal mood and affect  Allergies  Allergen Reactions  . Sulfa Antibiotics Anaphylaxis and Hives  . Chantix [Varenicline Tartrate]     Bad dreams  . Fish Allergy Other (See Comments)    Severe stomach cramps   . Penicillins Other (See Comments)    Has patient had a PCN reaction causing immediate rash, facial/tongue/throat swelling, SOB or lightheadedness with hypotension: No Has patient had a PCN reaction causing severe rash involving mucus membranes or skin necrosis: No Has patient had a PCN reaction that required hospitalization No Has patient had a PCN reaction occurring within the last 10 years: No If all of the above answers are "NO", then may proceed with Cephalosporin use.  Welts   . Latex Rash and Other (See Comments)    Dry skin    Past Medical History:  Diagnosis Date  . COPD (chronic obstructive pulmonary disease) ([place])   . Depression   . GERD (gastroesophageal reflux disease)   . Hernia, abdominal   . History of chicken pox   . Hyperlipidemia   . Hypertension   . Internal hemorrhoids with Grade 3 prolapse and bleeding 06/19/2013  .  Osteoarthritis   . Personal history of colonic adenomas 06/25/2013   Social History   Socioeconomic History  . Marital status: Married    Spouse name: Not on file  . Number of children: Not on file  . Years of education: 12+  . Highest education level: Not on file  Occupational History  . Occupation: PCA    Employer: [employer]  . Financial resource strain: Not on file  . Food insecurity:    Worry: Not on file    Inability: Not on file  . Transportation needs:     Medical: Not on file    Non-medical: Not on file  Tobacco Use  . Smoking status: Current Every Day Smoker    Packs/day: 0.25    Types: Cigarettes  . Smokeless tobacco: Never Used  . Tobacco comment: smokes 3 cigarettes daily- 12/01/17  Substance and Sexual Activity  . Alcohol use: Yes    Comment: occasional  . Drug use: No  . Sexual activity: Not on file  Lifestyle  . Physical activity:    Days per week: Not on file    Minutes per session: Not on file  . Stress: Not on file  Relationships  . Social connections:    Talks on phone: Not on file    Gets together: Not on file    Attends religious service: Not on file    Active member of club or organization: Not on file    Attends meetings of clubs or organizations: Not on file    Relationship status: Not on file  . Intimate partner violence:    Fear of current or ex partner: Not on file    Emotionally abused: Not on file    Physically abused: Not on file    Forced sexual activity: Not on file  Other Topics Concern  . Not on file  Social History Narrative   Regular exercise-no   Caffeine Use-   Family History  Problem Relation Age of Onset  . Cancer Father        prostate  . Hypertension Mother   . Diabetes Mother   . Hypertension Sister   . Heart disease Maternal Uncle   . Stroke Maternal Grandmother   . Hypertension Maternal Grandmother   . Hypertension Sister   . Colon cancer Neg Hx    Past Surgical History:  Procedure Laterality Date  . ABDOMINAL HYSTERECTOMY  52yrs ago   due to heavy bleeding and fibroids   . COLONOSCOPY    . HEMORRHOID BANDING  2014  . HEMORRHOID SURGERY  52yrs agao  . UMBILICAL HERNIA REPAIR  06/2015     Vanessa Kick, MD 10/17/18 1426

## 2018-10-02 ENCOUNTER — Encounter (HOSPITAL_COMMUNITY): Payer: Self-pay | Admitting: Emergency Medicine

## 2018-10-02 ENCOUNTER — Other Ambulatory Visit: Payer: Self-pay

## 2018-10-02 ENCOUNTER — Ambulatory Visit (HOSPITAL_COMMUNITY)
Admission: EM | Admit: 2018-10-02 | Discharge: 2018-10-02 | Disposition: A | Discharge status: Home / Self Care | Payer: 59 | Attending: Internal Medicine | Admitting: Internal Medicine

## 2018-10-02 DIAGNOSIS — H6501 Acute serous otitis media, right ear: Secondary | ICD-10-CM

## 2018-10-02 MED ORDER — BENZONATATE 100 MG PO CAPS: 100.0000 mg | ORAL_CAPSULE | Freq: Three times a day (TID) | ORAL | 0 refills | Status: DC

## 2018-10-02 MED ORDER — AMOXICILLIN 875 MG PO TABS: 875.0000 mg | ORAL_TABLET | Freq: Two times a day (BID) | ORAL | 0 refills | Status: DC

## 2018-10-02 MED FILL — AMOXICILLIN 875 MG TABLET: 875 | 7 days supply | Qty: 14 | Fill #0

## 2018-10-02 MED FILL — BENZONATATE 100 MG CAPS: 100 | 7 days supply | Qty: 21 | Fill #0

## 2018-10-02 NOTE — ED Triage Notes (Addendum)
PT reports cough, congestion, and subjective fever for 2 days. Afebrile today. PT reports pain over ribs while coughing.

## 2018-10-02 NOTE — ED Provider Notes (Signed)
[place]    CSN: [id number] Arrival date & time: 10/02/18  1020     History   Chief Complaint Chief Complaint  Patient presents with  . URI    HPI Krystal Allen is a 62 y.o. female.   62 year old female with COPD presents to urgent care complaining of fever and cough x3 days.  She reports that her grandson has had similar symptoms.  Her cough is nonproductive.  She denies body aches, nausea or vomiting but she admits to one episode of diarrhea at the onset of symptoms.     Past Medical History:  Diagnosis Date  . COPD (chronic obstructive pulmonary disease) ([place])   . Depression   . GERD (gastroesophageal reflux disease)   . Hernia, abdominal   . History of chicken pox   . Hyperlipidemia   . Hypertension   . Internal hemorrhoids with Grade 3 prolapse and bleeding 06/19/2013  . Osteoarthritis   . Personal history of colonic adenomas 06/25/2013    Patient Active Problem List   Diagnosis Date Noted  . ILD (interstitial lung disease) ([place]) 01/26/2018  . Cigarette smoker 01/26/2018  . Osteoarthritis of cervical spine 12/22/2017  . Prediabetes 12/22/2017  . Pain of left calf 04/06/2017  . Pulmonary emphysema ([place]) 02/23/2016  . Obesity (BMI 30-39.9) 01/15/2016  . Bilateral shoulder pain 01/15/2016  . Esophageal reflux 01/15/2016  . Tobacco abuse 05/13/2015  . Lung nodule 08/14/2014  . Impaired fasting glucose 08/14/2014  . Hyperlipidemia 08/14/2014  . Personal history of colonic adenomas 06/25/2013  . HTN (hypertension) 05/20/2013    Past Surgical History:  Procedure Laterality Date  . ABDOMINAL HYSTERECTOMY  48yrs ago   due to heavy bleeding and fibroids   . COLONOSCOPY    . HEMORRHOID BANDING  2014  . HEMORRHOID SURGERY  3yrs agao  . UMBILICAL HERNIA REPAIR  06/2015    OB History   None      Home Medications    Prior to Admission medications   Medication Sig Start Date End Date Taking? Authorizing Provider  acetaminophen (TYLENOL 8  HOUR) 650 MG CR tablet Take 1 tablet (650 mg total) by mouth 2 (two) times daily as needed for pain. 04/06/17   Funches, Adriana Mccallum, MD  albuterol (PROVENTIL HFA;VENTOLIN HFA) 108 (90 Base) MCG/ACT inhaler Inhale 2 puffs into the lungs every 6 (six) hours as needed for up to 14 days for wheezing or shortness of breath. 11/08/17 11/22/17  Argentina Donovan, PA-C  albuterol (PROVENTIL) (2.5 MG/3ML) 0.083% nebulizer solution Take 3 mLs (2.5 mg total) by nebulization every 6 (six) hours as needed for wheezing or shortness of breath. Patient not taking: Reported on 09/22/2018 11/08/17   Argentina Donovan, PA-C  amLODipine (NORVASC) 10 MG tablet Take 1 tablet (10 mg total) by mouth daily. 09/22/18   Vanessa Kick, MD  amoxicillin (AMOXIL) 875 MG tablet Take 1 tablet (875 mg total) by mouth 2 (two) times daily. 10/02/18   Harrie Foreman, MD  aspirin EC 81 MG tablet Take 1 tablet (81 mg total) by mouth daily. Patient not taking: Reported on 09/22/2018 04/06/17   Boykin Nearing, MD  atorvastatin (LIPITOR) 40 MG tablet Take 1 tablet (40 mg total) by mouth daily. 04/06/17   Funches, Adriana Mccallum, MD  benzonatate (TESSALON) 100 MG capsule Take 1 capsule (100 mg total) by mouth every 8 (eight) hours. 10/02/18   Harrie Foreman, MD  clindamycin (CLEOCIN) 300 MG capsule Take 1 capsule (300 mg total) by mouth  3 (three) times daily. 09/22/18   Vanessa Kick, MD  Fluticasone-Salmeterol (ADVAIR) 250-50 MCG/DOSE AEPB Inhale 1 puff into the lungs 2 (two) times daily for 14 days. 11/08/17 11/22/17  Argentina Donovan, PA-C  ibuprofen (ADVIL,MOTRIN) 800 MG tablet Take 1 tablet (800 mg total) by mouth 3 (three) times daily. 09/22/18   Vanessa Kick, MD  nicotine (NICODERM CQ - DOSED IN MG/24 HR) 7 mg/24hr patch Place 1 patch (7 mg total) onto the skin daily. Patient not taking: Reported on 09/22/2018 01/26/18   Marshell Garfinkel, MD  omeprazole (PRILOSEC) 20 MG capsule Take 1 capsule (20 mg total) by mouth daily. 12/22/17   Ladell Pier,  MD  Respiratory Therapy Supplies (FLUTTER) DEVI Use as directed 12/01/17   Marshell Garfinkel, MD    Family History Family History  Problem Relation Age of Onset  . Cancer Father        prostate  . Hypertension Mother   . Diabetes Mother   . Hypertension Sister   . Heart disease Maternal Uncle   . Stroke Maternal Grandmother   . Hypertension Maternal Grandmother   . Hypertension Sister   . Colon cancer Neg Hx     Social History Social History   Tobacco Use  . Smoking status: Current Every Day Smoker    Packs/day: 0.25    Types: Cigarettes  . Smokeless tobacco: Never Used  . Tobacco comment: smokes 3 cigarettes daily- 12/01/17  Substance Use Topics  . Alcohol use: Yes    Comment: occasional  . Drug use: No     Allergies   Sulfa antibiotics; Chantix [varenicline tartrate]; Fish allergy; Penicillins; and Latex   Review of Systems Review of Systems  Constitutional: Positive for fever. Negative for chills.  HENT: Negative for sore throat and tinnitus.   Eyes: Negative for redness.  Respiratory: Positive for cough. Negative for shortness of breath.   Cardiovascular: Negative for chest pain and palpitations.  Gastrointestinal: Negative for abdominal pain, diarrhea, nausea and vomiting.  Genitourinary: Negative for dysuria, frequency and urgency.  Musculoskeletal: Negative for myalgias.  Skin: Negative for rash.       No lesions  Neurological: Negative for weakness.  Hematological: Does not bruise/bleed easily.  Psychiatric/Behavioral: Negative for suicidal ideas.     Physical Exam Triage Vital Signs ED Triage Vitals  Enc Vitals Group     BP 10/02/18 1121 (!) 141/76     Pulse Rate 10/02/18 1120 64     Resp 10/02/18 1120 16     Temp 10/02/18 1120 97.8 F (36.6 C)     Temp Source 10/02/18 1120 Oral     SpO2 10/02/18 1120 97 %     Weight --      Height --      Head Circumference --      Peak Flow --      Pain Score 10/02/18 1121 5     Pain Loc --      Pain Edu?  --      Excl. in [place]? --    No data found.  Updated Vital Signs BP (!) 141/76   Pulse 64   Temp 97.8 F (36.6 C) (Oral)   Resp 16   SpO2 97%   Visual Acuity Right Eye Distance:   Left Eye Distance:   Bilateral Distance:    Right Eye Near:   Left Eye Near:    Bilateral Near:     Physical Exam  Constitutional: She is oriented to person, place, and  time. She appears well-developed and well-nourished. No distress.  HENT:  Head: Normocephalic and atraumatic.  Right Ear: A middle ear effusion is present.  Mouth/Throat: Oropharynx is clear and moist.  Eyes: Pupils are equal, round, and reactive to light. Conjunctivae and EOM are normal. No scleral icterus.  Neck: Normal range of motion. Neck supple. No JVD present. No tracheal deviation present. No thyromegaly present.  Cardiovascular: Normal rate, regular rhythm and normal heart sounds. Exam reveals no gallop and no friction rub.  No murmur heard. Pulmonary/Chest: Effort normal and breath sounds normal.  Abdominal: Soft. Bowel sounds are normal. She exhibits no distension. There is no tenderness.  Musculoskeletal: Normal range of motion. She exhibits no edema.  Lymphadenopathy:    She has no cervical adenopathy.  Neurological: She is alert and oriented to person, place, and time. No cranial nerve deficit.  Skin: Skin is warm and dry.  Psychiatric: She has a normal mood and affect. Her behavior is normal. Judgment and thought content normal.  Nursing note and vitals reviewed.    UC Treatments / Results  Labs (all labs ordered are listed, but only abnormal results are displayed) Labs Reviewed - No data to display  EKG None  Radiology No results found.  Procedures Procedures (including critical care time)  Medications Ordered in UC Medications - No data to display  Initial Impression / Assessment and Plan / UC Course  I have reviewed the triage vital signs and the nursing notes.  Pertinent labs & imaging results  that were available during my care of the patient were reviewed by me and considered in my medical decision making (see chart for details).     Patient has an ear infection which may be viral but also a scratchy throat.  No signs or symptoms of sepsis.  No wheezing or COPD exacerbation.  Final Clinical Impressions(s) / UC Diagnoses   Final diagnoses:  Non-recurrent acute serous otitis media of right ear   Discharge Instructions   None    ED Prescriptions    Medication Sig Dispense Auth. Provider   amoxicillin (AMOXIL) 875 MG tablet Take 1 tablet (875 mg total) by mouth 2 (two) times daily. 14 tablet Harrie Foreman, MD   benzonatate (TESSALON) 100 MG capsule Take 1 capsule (100 mg total) by mouth every 8 (eight) hours. 21 capsule Harrie Foreman, MD     Controlled Substance Prescriptions [place] Controlled Substance Registry consulted? Not Applicable   Harrie Foreman, MD 10/02/18 1320

## 2018-10-04 ENCOUNTER — Encounter: Payer: Self-pay | Admitting: Internal Medicine

## 2018-10-11 NOTE — Progress Notes (Signed)
Patient ID: Krystal Allen, female   DOB: 02-11-1956, 62 y.o.   MRN: [id number]     Bernisha Verma, is a 62 y.o. female  GUR:[id number]  EGB:[id number]  DOB - 1955/11/21  Subjective:  Chief Complaint and HPI: Krystal Allen is a 62 y.o. female here today for a follow up visit After being seen at [facility] 10/02/2018 for serous otitis media and prescribed amoxicillin and tessalon perles.  She is doing much better, but R ear still feels congested and hearing is a little muffled.  No fever.  Finished meds.    L knee bothering her for several days. No fever.    She has known arthritis in the knee.  NKI.    BP has been controlled.  No CP/dizziness/HA.    Also needs RF on BP meds and inhalers.  ED/Hospital notes reviewed.     ROS:   Constitutional:  No f/c, No night sweats, No unexplained weight loss. EENT:  No vision changes, No blurry vision, No hearing changes. Respiratory: No cough, No SOB Cardiac: No CP, no palpitations GI:  No abd pain, No N/V/D. GU: No Urinary s/sx Musculoskeletal: +L knee pain Neuro: No headache, no dizziness, no motor weakness.  Skin: No rash Endocrine:  No polydipsia. No polyuria.  Psych: Denies SI/HI  No problems updated.  ALLERGIES: Allergies  Allergen Reactions  . Sulfa Antibiotics Anaphylaxis and Hives  . Chantix [Varenicline Tartrate]     Bad dreams  . Fish Allergy Other (See Comments)    Severe stomach cramps   . Penicillins Other (See Comments)    Has patient had a PCN reaction causing immediate rash, facial/tongue/throat swelling, SOB or lightheadedness with hypotension: No Has patient had a PCN reaction causing severe rash involving mucus membranes or skin necrosis: No Has patient had a PCN reaction that required hospitalization No Has patient had a PCN reaction occurring within the last 10 years: No If all of the above answers are "NO", then may proceed with Cephalosporin use.  Welts   . Latex Rash and Other (See Comments)    Dry skin     PAST MEDICAL HISTORY: Past Medical History:  Diagnosis Date  . COPD (chronic obstructive pulmonary disease) ([place])   . Depression   . GERD (gastroesophageal reflux disease)   . Hernia, abdominal   . History of chicken pox   . Hyperlipidemia   . Hypertension   . Internal hemorrhoids with Grade 3 prolapse and bleeding 06/19/2013  . Osteoarthritis   . Personal history of colonic adenomas 06/25/2013    MEDICATIONS AT HOME: Prior to Admission medications   Medication Sig Start Date End Date Taking? Authorizing Provider  acetaminophen (TYLENOL 8 HOUR) 650 MG CR tablet Take 1 tablet (650 mg total) by mouth 2 (two) times daily as needed for pain. 04/06/17   Funches, Adriana Mccallum, MD  albuterol (PROVENTIL HFA;VENTOLIN HFA) 108 (90 Base) MCG/ACT inhaler Inhale 2 puffs into the lungs every 6 (six) hours as needed for up to 14 days for wheezing or shortness of breath. 11/08/17 11/22/17  Argentina Donovan, PA-C  albuterol (PROVENTIL) (2.5 MG/3ML) 0.083% nebulizer solution Take 3 mLs (2.5 mg total) by nebulization every 6 (six) hours as needed for wheezing or shortness of breath. Patient not taking: Reported on 09/22/2018 11/08/17   Argentina Donovan, PA-C  amLODipine (NORVASC) 10 MG tablet Take 1 tablet (10 mg total) by mouth daily. 10/17/18   Argentina Donovan, PA-C  amoxicillin (AMOXIL) 875 MG tablet Take 1 tablet (875 mg  total) by mouth 2 (two) times daily. Patient not taking: Reported on 10/17/2018 10/02/18   Harrie Foreman, MD  aspirin EC 81 MG tablet Take 1 tablet (81 mg total) by mouth daily. Patient not taking: Reported on 09/22/2018 04/06/17   Boykin Nearing, MD  atorvastatin (LIPITOR) 40 MG tablet Take 1 tablet (40 mg total) by mouth daily. 04/06/17   Funches, Adriana Mccallum, MD  benzonatate (TESSALON) 100 MG capsule Take 1 capsule (100 mg total) by mouth every 8 (eight) hours. Patient not taking: Reported on 10/17/2018 10/02/18   Harrie Foreman, MD  clindamycin (CLEOCIN) 300 MG capsule Take 1 capsule  (300 mg total) by mouth 3 (three) times daily. Patient not taking: Reported on 10/17/2018 09/22/18   Vanessa Kick, MD  fluticasone [facility]) 50 MCG/ACT nasal spray Place 2 sprays into both nostrils daily. 10/17/18   Argentina Donovan, PA-C  Fluticasone-Salmeterol (ADVAIR) 250-50 MCG/DOSE AEPB Inhale 1 puff into the lungs 2 (two) times daily for 14 days. 11/08/17 11/22/17  Argentina Donovan, PA-C  ibuprofen (ADVIL,MOTRIN) 800 MG tablet Take 1 tablet (800 mg total) by mouth 3 (three) times daily. 09/22/18   Vanessa Kick, MD  nicotine (NICODERM CQ - DOSED IN MG/24 HR) 7 mg/24hr patch Place 1 patch (7 mg total) onto the skin daily. Patient not taking: Reported on 09/22/2018 01/26/18   Marshell Garfinkel, MD  omeprazole (PRILOSEC) 20 MG capsule Take 1 capsule (20 mg total) by mouth daily. 12/22/17   Ladell Pier, MD  predniSONE (DELTASONE) 10 MG tablet 6,5,4,3,2,1 take each days dose all at once in am with food 10/17/18   [place] Donovan, PA-C  Respiratory Therapy Supplies (FLUTTER) DEVI Use as directed 12/01/17   Marshell Garfinkel, MD     Objective:  EXAM:   Vitals:   10/17/18 1105  BP: 133/84  Pulse: 67  Resp: 16  Temp: 98.3 F (36.8 C)  TempSrc: Oral  SpO2: 95%  Weight: 244 lb 9.6 oz (110.9 kg)    General appearance : A&OX3. NAD. Non-toxic-appearing HEENT: Atraumatic and Normocephalic.  PERRLA. EOM intact.  TM full on R, WNL on L.  . Mouth-MMM, post pharynx WNL w/o erythema, No PND. Neck: supple, no JVD. No cervical lymphadenopathy. No thyromegaly Chest/Lungs:  Breathing-non-labored, Good air entry bilaterally, breath sounds normal without rales, rhonchi, or wheezing  CVS: S1 S2 regular, no murmurs, gallops, rubs  L knee without erythema or ballotment/effusion.  Mild crepitus with extension.  ROM full.  Ligaments stable.  No calf erythema/swelling/TTP.  Negative Homan's.   Extremities: Bilateral Lower Ext shows no edema, both legs are warm to touch with = pulse throughout Neurology:   CN II-XII grossly intact, Non focal.   Psych:  TP linear. J/I WNL. Normal speech. Appropriate eye contact and affect.  Skin:  No Rash  Data Review Lab Results  Component Value Date   HGBA1C 5.7 04/06/2017   HGBA1C 5.9 01/15/2016   HGBA1C 5.9 08/14/2014     Assessment & Plan   1. Hyperglycemia Not diabetic.  I have had a lengthy discussion and provided education about insulin resistance and the intake of too much sugar/refined carbohydrates.  I have advised the patient to work at a goal of eliminating sugary drinks, candy, desserts, sweets, refined sugars, processed foods, and white carbohydrates.  The patient expresses understanding.   - HgB A1c - Glucose (CBG)  2. Dysfunction of right eustachian tube - fluticasone (FLONASE) 50 MCG/ACT nasal spray; Place 2 sprays into both nostrils daily.  Dispense:  16 g; Refill: 6 - predniSONE (DELTASONE) 10 MG tablet; 6,5,4,3,2,1 take each days dose all at once in am with food  Dispense: 21 tablet; Refill: 0 Can also try phenylephrine and plain mucinex.  Check BP to make sure not affected  3. Essential hypertension Controlled-continue - amLODipine (NORVASC) 10 MG tablet; Take 1 tablet (10 mg total) by mouth daily.  Dispense: 30 tablet; Refill: 5  4. Encounter for examination following treatment at hospital improving  5. Pain in lateral portion of left knee - predniSONE (DELTASONE) 10 MG tablet; 6,5,4,3,2,1 take each days dose all at once in am with food  Dispense: 21 tablet; Refill: 0 She can take tylenol for pain while on prednisone.    Patient have been counseled extensively about nutrition and exercise  Return in about 4 months (around 02/16/2019) for Dr Wynetta Emery; blood sugar and BP f/up.  The patient was given clear instructions to go to ER or return to medical center if symptoms don't improve, worsen or new problems develop. The patient verbalized understanding. The patient was told to call to get lab results if they haven't heard  anything in the next week.     Freeman Caldron, PA-C [facility] [place], [place]   10/17/2018, 11:23 AM

## 2018-10-17 ENCOUNTER — Ambulatory Visit: Payer: 59 | Attending: Internal Medicine | Admitting: Physician Assistant

## 2018-10-17 VITALS — BP 133/84 | HR 67 | Temp 98.3°F | Resp 16 | Wt 244.6 lb

## 2018-10-17 DIAGNOSIS — F329 Major depressive disorder, single episode, unspecified: Secondary | ICD-10-CM | POA: Insufficient documentation

## 2018-10-17 DIAGNOSIS — K219 Gastro-esophageal reflux disease without esophagitis: Secondary | ICD-10-CM

## 2018-10-17 DIAGNOSIS — J449 Chronic obstructive pulmonary disease, unspecified: Secondary | ICD-10-CM | POA: Insufficient documentation

## 2018-10-17 DIAGNOSIS — H6981 Other specified disorders of Eustachian tube, right ear: Secondary | ICD-10-CM | POA: Diagnosis not present

## 2018-10-17 DIAGNOSIS — Z7951 Long term (current) use of inhaled steroids: Secondary | ICD-10-CM | POA: Insufficient documentation

## 2018-10-17 DIAGNOSIS — I10 Essential (primary) hypertension: Secondary | ICD-10-CM

## 2018-10-17 DIAGNOSIS — E785 Hyperlipidemia, unspecified: Secondary | ICD-10-CM | POA: Insufficient documentation

## 2018-10-17 DIAGNOSIS — M1710 Unilateral primary osteoarthritis, unspecified knee: Secondary | ICD-10-CM | POA: Insufficient documentation

## 2018-10-17 DIAGNOSIS — Z09 Encounter for follow-up examination after completed treatment for conditions other than malignant neoplasm: Secondary | ICD-10-CM

## 2018-10-17 DIAGNOSIS — Z7982 Long term (current) use of aspirin: Secondary | ICD-10-CM | POA: Insufficient documentation

## 2018-10-17 DIAGNOSIS — M25562 Pain in left knee: Secondary | ICD-10-CM | POA: Diagnosis not present

## 2018-10-17 DIAGNOSIS — R739 Hyperglycemia, unspecified: Secondary | ICD-10-CM

## 2018-10-17 DIAGNOSIS — J42 Unspecified chronic bronchitis: Secondary | ICD-10-CM | POA: Diagnosis not present

## 2018-10-17 DIAGNOSIS — Z791 Long term (current) use of non-steroidal anti-inflammatories (NSAID): Secondary | ICD-10-CM | POA: Insufficient documentation

## 2018-10-17 DIAGNOSIS — Z79899 Other long term (current) drug therapy: Secondary | ICD-10-CM | POA: Insufficient documentation

## 2018-10-17 DIAGNOSIS — H6991 Unspecified Eustachian tube disorder, right ear: Secondary | ICD-10-CM

## 2018-10-17 LAB — POCT GLYCOSYLATED HEMOGLOBIN (HGB A1C): HbA1c, POC (prediabetic range): 5.7 % (ref 5.7–6.4)

## 2018-10-17 LAB — GLUCOSE, POCT (MANUAL RESULT ENTRY): POC Glucose: 105 mg/dl — AB (ref 70–99)

## 2018-10-17 MED ORDER — OMEPRAZOLE 20 MG PO CPDR: 20.0000 mg | DELAYED_RELEASE_CAPSULE | Freq: Every day | ORAL | 3 refills | Status: DC

## 2018-10-17 MED ORDER — AMLODIPINE BESYLATE 10 MG PO TABS: 10.0000 mg | ORAL_TABLET | Freq: Every day | ORAL | 5 refills | Status: DC

## 2018-10-17 MED ORDER — FLUTICASONE PROPIONATE 50 MCG/ACT NA SUSP: 2.0000 | Freq: Every day | NASAL | 6 refills | Status: DC

## 2018-10-17 MED ORDER — FLUTICASONE-SALMETEROL 250-50 MCG/DOSE IN AEPB: INHALATION_SPRAY | RESPIRATORY_TRACT | 4 refills | Status: DC

## 2018-10-17 MED ORDER — PREDNISONE 10 MG PO TABS: ORAL_TABLET | ORAL | 0 refills | Status: DC

## 2018-10-17 MED ORDER — ALBUTEROL SULFATE 108 (90 BASE) MCG/ACT IN AEPB: 2.0000 | INHALATION_SPRAY | RESPIRATORY_TRACT | 2 refills | Status: DC | PRN

## 2018-10-17 MED FILL — AMLODIPINE BESYLATE 10 MG T: 10 | 90 days supply | Qty: 90 | Fill #0

## 2018-10-17 MED FILL — OMEPRAZOLE 20 MG CPDR: 20 | 30 days supply | Qty: 30 | Fill #0

## 2018-10-17 MED FILL — FLUTICASONE PROP 50 MCG SPR: 50 | 30 days supply | Qty: 16 | Fill #0

## 2018-10-17 MED FILL — predniSONE 10 MG TABS: 10 | 6 days supply | Qty: 21 | Fill #0

## 2018-10-17 MED FILL — ADVAIR 250/50 DISKUS: 250-50 | 90 days supply | Qty: 180 | Fill #0

## 2018-10-17 NOTE — Patient Instructions (Addendum)
Can try Mucinex D or Phenylephrine plus mucinex  Eustachian Tube Dysfunction  Eustachian tube dysfunction refers to a condition in which a blockage develops in the narrow passage that connects the middle ear to the back of the nose (eustachian tube). The eustachian tube regulates air pressure in the middle ear by letting air move between the ear and nose. It also helps to drain fluid from the middle ear space. Eustachian tube dysfunction can affect one or both ears. When the eustachian tube does not function properly, air pressure, fluid, or both can build up in the middle ear. What are the causes? This condition occurs when the eustachian tube becomes blocked or cannot open normally. Common causes of this condition include:  Ear infections.  Colds and other infections that affect the nose, mouth, and throat (upper respiratory tract).  Allergies.  Irritation from cigarette smoke.  Irritation from stomach acid coming up into the esophagus (gastroesophageal reflux). The esophagus is the tube that carries food from the mouth to the stomach.  Sudden changes in air pressure, such as from descending in an airplane or scuba diving.  Abnormal growths in the nose or throat, such as: ? Growths that line the nose (nasal polyps). ? Abnormal growth of cells (tumors). ? Enlarged tissue at the back of the throat (adenoids). What increases the risk? You are more likely to develop this condition if:  You smoke.  You are overweight.  You are a child who has: ? Certain birth defects of the mouth, such as cleft palate. ? Large tonsils or adenoids. What are the signs or symptoms? Common symptoms of this condition include:  A feeling of fullness in the ear.  Ear pain.  Clicking or popping noises in the ear.  Ringing in the ear.  Hearing loss.  Loss of balance.  Dizziness. Symptoms may get worse when the air pressure around you changes, such as when you travel to an area of high  elevation, fly on an airplane, or go scuba diving. How is this diagnosed? This condition may be diagnosed based on:  Your symptoms.  A physical exam of your ears, nose, and throat.  Tests, such as those that measure: ? The movement of your eardrum (tympanogram). ? Your hearing (audiometry). How is this treated? Treatment depends on the cause and severity of your condition.  In mild cases, you may relieve your symptoms by moving air into your ears. This is called "popping the ears."  In more severe cases, or if you have symptoms of fluid in your ears, treatment may include: ? Medicines to relieve congestion (decongestants). ? Medicines that treat allergies (antihistamines). ? Nasal sprays or ear drops that contain medicines that reduce swelling (steroids). ? A procedure to drain the fluid in your eardrum (myringotomy). In this procedure, a small tube is placed in the eardrum to:  Drain the fluid.  Restore the air in the middle ear space. ? A procedure to insert a balloon device through the nose to inflate the opening of the eustachian tube (balloon dilation). Follow these instructions at home: Lifestyle  Do not do any of the following until your health care provider approves: ? Travel to high altitudes. ? Fly in airplanes. ? Work in a Pension scheme manager or room. ? Scuba dive.  Do not use any products that contain nicotine or tobacco, such as cigarettes and e-cigarettes. If you need help quitting, ask your health care provider.  Keep your ears dry. Wear fitted earplugs during showering and bathing. Dry  your ears completely after. General instructions  Take over-the-counter and prescription medicines only as told by your health care provider.  Use techniques to help pop your ears as recommended by your health care provider. These may include: ? Chewing gum. ? Yawning. ? Frequent, forceful swallowing. ? Closing your mouth, holding your nose closed, and gently blowing as if  you are trying to blow air out of your nose.  Keep all follow-up visits as told by your health care provider. This is important. Contact a health care provider if:  Your symptoms do not go away after treatment.  Your symptoms come back after treatment.  You are unable to pop your ears.  You have: ? A fever. ? Pain in your ear. ? Pain in your head or neck. ? Fluid draining from your ear.  Your hearing suddenly changes.  You become very dizzy.  You lose your balance. Summary  Eustachian tube dysfunction refers to a condition in which a blockage develops in the eustachian tube.  It can be caused by ear infections, allergies, inhaled irritants, or abnormal growths in the nose or throat.  Symptoms include ear pain, hearing loss, or ringing in the ears.  Mild cases are treated with maneuvers to unblock the ears, such as yawning or ear popping.  Severe cases are treated with medicines. Surgery may also be done (rare). This information is not intended to replace advice given to you by your health care provider. Make sure you discuss any questions you have with your health care provider. Document Released: 11/13/2015 Document Revised: 02/06/2018 Document Reviewed: 02/06/2018 Elsevier Interactive Patient Education  2019 [employer].

## 2019-02-11 MED FILL — AMLODIPINE BESYLATE 10 MG T: 10 | 30 days supply | Qty: 30 | Fill #0

## 2019-03-28 ENCOUNTER — Other Ambulatory Visit: Payer: Self-pay | Admitting: Physician Assistant

## 2019-03-28 DIAGNOSIS — I10 Essential (primary) hypertension: Secondary | ICD-10-CM

## 2019-03-28 MED FILL — AMLODIPINE BESYLATE 10 MG T: 10 | 30 days supply | Qty: 30 | Fill #0

## 2019-04-13 DIAGNOSIS — F17210 Nicotine dependence, cigarettes, uncomplicated: Secondary | ICD-10-CM | POA: Insufficient documentation

## 2019-04-13 DIAGNOSIS — J449 Chronic obstructive pulmonary disease, unspecified: Secondary | ICD-10-CM | POA: Insufficient documentation

## 2019-04-13 DIAGNOSIS — R600 Localized edema: Secondary | ICD-10-CM | POA: Diagnosis not present

## 2019-04-13 DIAGNOSIS — Z79899 Other long term (current) drug therapy: Secondary | ICD-10-CM | POA: Diagnosis not present

## 2019-04-13 DIAGNOSIS — Z7982 Long term (current) use of aspirin: Secondary | ICD-10-CM | POA: Diagnosis not present

## 2019-04-13 DIAGNOSIS — Z880 Allergy status to penicillin: Secondary | ICD-10-CM | POA: Insufficient documentation

## 2019-04-13 DIAGNOSIS — I10 Essential (primary) hypertension: Secondary | ICD-10-CM | POA: Diagnosis not present

## 2019-04-13 DIAGNOSIS — Z86718 Personal history of other venous thrombosis and embolism: Secondary | ICD-10-CM | POA: Diagnosis not present

## 2019-04-13 DIAGNOSIS — Z91040 Latex allergy status: Secondary | ICD-10-CM | POA: Insufficient documentation

## 2019-04-13 DIAGNOSIS — M79604 Pain in right leg: Secondary | ICD-10-CM | POA: Diagnosis not present

## 2019-04-13 DIAGNOSIS — M79661 Pain in right lower leg: Secondary | ICD-10-CM | POA: Diagnosis not present

## 2019-04-14 ENCOUNTER — Encounter (HOSPITAL_COMMUNITY): Payer: Self-pay | Admitting: Student

## 2019-04-14 ENCOUNTER — Encounter (HOSPITAL_COMMUNITY): Payer: Self-pay | Admitting: Emergency Medicine

## 2019-04-14 ENCOUNTER — Ambulatory Visit (HOSPITAL_BASED_OUTPATIENT_CLINIC_OR_DEPARTMENT_OTHER)
Admission: RE | Admit: 2019-04-14 | Discharge: 2019-04-14 | Disposition: A | Discharge status: Home / Self Care | Payer: 59 | Source: Ambulatory Visit | Attending: Emergency Medicine | Admitting: Emergency Medicine

## 2019-04-14 ENCOUNTER — Emergency Department (HOSPITAL_COMMUNITY)
Admission: EM | Admit: 2019-04-14 | Discharge: 2019-04-14 | Disposition: A | Discharge status: Home / Self Care | Payer: 59 | Source: Home / Self Care | Attending: Emergency Medicine | Admitting: Emergency Medicine

## 2019-04-14 ENCOUNTER — Emergency Department (HOSPITAL_COMMUNITY)
Admission: EM | Admit: 2019-04-14 | Discharge: 2019-04-14 | Disposition: A | Discharge status: Home / Self Care | Payer: 59 | Attending: Emergency Medicine | Admitting: Emergency Medicine

## 2019-04-14 ENCOUNTER — Other Ambulatory Visit: Payer: Self-pay

## 2019-04-14 DIAGNOSIS — Z86718 Personal history of other venous thrombosis and embolism: Secondary | ICD-10-CM

## 2019-04-14 DIAGNOSIS — Z79899 Other long term (current) drug therapy: Secondary | ICD-10-CM | POA: Insufficient documentation

## 2019-04-14 DIAGNOSIS — J449 Chronic obstructive pulmonary disease, unspecified: Secondary | ICD-10-CM | POA: Insufficient documentation

## 2019-04-14 DIAGNOSIS — R52 Pain, unspecified: Secondary | ICD-10-CM | POA: Diagnosis not present

## 2019-04-14 DIAGNOSIS — I10 Essential (primary) hypertension: Secondary | ICD-10-CM | POA: Insufficient documentation

## 2019-04-14 DIAGNOSIS — R609 Edema, unspecified: Secondary | ICD-10-CM

## 2019-04-14 DIAGNOSIS — F17210 Nicotine dependence, cigarettes, uncomplicated: Secondary | ICD-10-CM | POA: Insufficient documentation

## 2019-04-14 DIAGNOSIS — M79604 Pain in right leg: Secondary | ICD-10-CM | POA: Insufficient documentation

## 2019-04-14 DIAGNOSIS — Z880 Allergy status to penicillin: Secondary | ICD-10-CM | POA: Diagnosis not present

## 2019-04-14 DIAGNOSIS — R600 Localized edema: Secondary | ICD-10-CM | POA: Diagnosis not present

## 2019-04-14 DIAGNOSIS — M79661 Pain in right lower leg: Secondary | ICD-10-CM

## 2019-04-14 DIAGNOSIS — Z7982 Long term (current) use of aspirin: Secondary | ICD-10-CM | POA: Diagnosis not present

## 2019-04-14 DIAGNOSIS — Z91040 Latex allergy status: Secondary | ICD-10-CM | POA: Insufficient documentation

## 2019-04-14 LAB — I-STAT CHEM 8, ED
BUN: 16 mg/dL (ref 8–23)
Calcium, Ion: 1.12 mmol/L — ABNORMAL LOW (ref 1.15–1.40)
Chloride: 108 mmol/L (ref 98–111)
Creatinine, Ser: 1.1 mg/dL — ABNORMAL HIGH (ref 0.44–1.00)
Glucose, Bld: 89 mg/dL (ref 70–99)
HCT: 45 % (ref 36.0–46.0)
Hemoglobin: 15.3 g/dL — ABNORMAL HIGH (ref 12.0–15.0)
Potassium: 4.2 mmol/L (ref 3.5–5.1)
Sodium: 140 mmol/L (ref 135–145)
TCO2: 24 mmol/L (ref 22–32)

## 2019-04-14 LAB — MAGNESIUM: Magnesium: 1.9 mg/dL (ref 1.7–2.4)

## 2019-04-14 MED ORDER — DICLOFENAC SODIUM 1 % TD GEL: 4.0000 g | Freq: Four times a day (QID) | TRANSDERMAL | 0 refills | Status: DC | PRN

## 2019-04-14 MED ORDER — HYDROCODONE-ACETAMINOPHEN 5-325 MG PO TABS: 1.0000 | ORAL_TABLET | Freq: Four times a day (QID) | ORAL | 0 refills | Status: DC | PRN

## 2019-04-14 MED ORDER — ENOXAPARIN SODIUM 120 MG/0.8ML ~~LOC~~ SOLN
1.0000 mg/kg | Freq: Once | SUBCUTANEOUS | Status: AC
  Administered 2019-04-14: 110 mg via SUBCUTANEOUS
  Filled 2019-04-14: qty 0.73

## 2019-04-14 MED ORDER — HYDROCODONE-ACETAMINOPHEN 5-325 MG PO TABS
2.0000 | ORAL_TABLET | Freq: Once | ORAL | Status: AC
  Administered 2019-04-14: 2 via ORAL
  Filled 2019-04-14: qty 2

## 2019-04-14 MED ORDER — METHOCARBAMOL 500 MG PO TABS
750.0000 mg | ORAL_TABLET | Freq: Once | ORAL | Status: AC
  Administered 2019-04-14: 750 mg via ORAL
  Filled 2019-04-14: qty 2

## 2019-04-14 MED ORDER — METHOCARBAMOL 500 MG PO TABS: 500.0000 mg | ORAL_TABLET | Freq: Three times a day (TID) | ORAL | 0 refills | Status: DC | PRN

## 2019-04-14 NOTE — Progress Notes (Signed)
VASCULAR LAB PRELIMINARY  PRELIMINARY  PRELIMINARY  PRELIMINARY  Right lower extremity venous duplex completed.    Preliminary report:  See CV proc for preliminary results.   Dakiya Puopolo, RVT 04/14/2019, 11:57 AM

## 2019-04-14 NOTE — ED Provider Notes (Signed)
[facility] DEPT Provider Note   CSN: [id number] Arrival date & time: 04/13/19  2359     History   Chief Complaint Chief Complaint  Patient presents with  . Leg Pain    HPI ANILAH HUCK is a 63 y.o. female.     Patient is a 63 year old female with past medical history of COPD, GERD, hypertension, and prior DVT in the left leg.  She presents today for evaluation of right calf pain.  This started hurting several days ago and is worsening.  It began in the absence of any injury or trauma.  She is finding it difficult to ambulate secondary to pain.  She denies any chest pain or difficulty breathing.  She denies any fevers or chills.  The history is provided by the patient.  Leg Pain Location:  Leg Leg location:  R leg Pain details:    Quality:  Sharp   Radiates to:  Does not radiate   Severity:  Moderate   Timing:  Constant   Progression:  Worsening Chronicity:  New Relieved by:  Nothing Worsened by:  Bearing weight and activity   Past Medical History:  Diagnosis Date  . COPD (chronic obstructive pulmonary disease) ([place])   . Depression   . GERD (gastroesophageal reflux disease)   . Hernia, abdominal   . History of chicken pox   . Hyperlipidemia   . Hypertension   . Internal hemorrhoids with Grade 3 prolapse and bleeding 06/19/2013  . Osteoarthritis   . Personal history of colonic adenomas 06/25/2013    Patient Active Problem List   Diagnosis Date Noted  . ILD (interstitial lung disease) ([place]) 01/26/2018  . Cigarette smoker 01/26/2018  . Osteoarthritis of cervical spine 12/22/2017  . Prediabetes 12/22/2017  . Pain of left calf 04/06/2017  . Pulmonary emphysema ([place]) 02/23/2016  . Obesity (BMI 30-39.9) 01/15/2016  . Bilateral shoulder pain 01/15/2016  . Esophageal reflux 01/15/2016  . Tobacco abuse 05/13/2015  . Lung nodule 08/14/2014  . Impaired fasting glucose 08/14/2014  . Hyperlipidemia 08/14/2014  . Personal history of  colonic adenomas 06/25/2013  . HTN (hypertension) 05/20/2013    Past Surgical History:  Procedure Laterality Date  . ABDOMINAL HYSTERECTOMY  67yrs ago   due to heavy bleeding and fibroids   . COLONOSCOPY    . HEMORRHOID BANDING  2014  . HEMORRHOID SURGERY  35yrs agao  . UMBILICAL HERNIA REPAIR  06/2015     OB History   No obstetric history on file.      Home Medications    Prior to Admission medications   Medication Sig Start Date End Date Taking? Authorizing Provider  acetaminophen (TYLENOL 8 HOUR) 650 MG CR tablet Take 1 tablet (650 mg total) by mouth 2 (two) times daily as needed for pain. 04/06/17   Funches, Adriana Mccallum, MD  albuterol (PROVENTIL) (2.5 MG/3ML) 0.083% nebulizer solution Take 3 mLs (2.5 mg total) by nebulization every 6 (six) hours as needed for wheezing or shortness of breath. Patient not taking: Reported on 09/22/2018 11/08/17   Argentina Donovan, PA-C  Albuterol Sulfate (PROAIR RESPICLICK) 793 (90 Base) MCG/ACT AEPB Inhale 2 puffs into the lungs every 4 (four) hours as needed. 10/17/18   Argentina Donovan, PA-C  amLODipine (NORVASC) 10 MG tablet Take 1 tablet (10 mg total) by mouth daily. MUST MAKE APPT FOR FURTHER REFILLS 03/28/19   Ladell Pier, MD  aspirin EC 81 MG tablet Take 1 tablet (81 mg total) by mouth daily. Patient  not taking: Reported on 09/22/2018 04/06/17   Boykin Nearing, MD  atorvastatin (LIPITOR) 40 MG tablet Take 1 tablet (40 mg total) by mouth daily. 04/06/17   Funches, Adriana Mccallum, MD  fluticasone (FLONASE) 50 MCG/ACT nasal spray Place 2 sprays into both nostrils daily. 10/17/18   Argentina Donovan, PA-C  Fluticasone-Salmeterol (ADVAIR) 250-50 MCG/DOSE AEPB 1 puff bid 10/17/18   Argentina Donovan, PA-C  ibuprofen (ADVIL,MOTRIN) 800 MG tablet Take 1 tablet (800 mg total) by mouth 3 (three) times daily. 09/22/18   Vanessa Kick, MD  nicotine (NICODERM CQ - DOSED IN MG/24 HR) 7 mg/24hr patch Place 1 patch (7 mg total) onto the skin daily. Patient not  taking: Reported on 09/22/2018 01/26/18   Marshell Garfinkel, MD  omeprazole (PRILOSEC) 20 MG capsule Take 1 capsule (20 mg total) by mouth daily. 10/17/18   Argentina Donovan, PA-C  predniSONE (DELTASONE) 10 MG tablet 6,5,4,3,2,1 take each days dose all at once in am with food 10/17/18   Argentina Donovan, PA-C  Respiratory Therapy Supplies (FLUTTER) DEVI Use as directed 12/01/17   Marshell Garfinkel, MD    Family History Family History  Problem Relation Age of Onset  . Cancer Father        prostate  . Hypertension Mother   . Diabetes Mother   . Hypertension Sister   . Heart disease Maternal Uncle   . Stroke Maternal Grandmother   . Hypertension Maternal Grandmother   . Hypertension Sister   . Colon cancer Neg Hx     Social History Social History   Tobacco Use  . Smoking status: Current Every Day Smoker    Packs/day: 0.25    Types: Cigarettes  . Smokeless tobacco: Never Used  . Tobacco comment: smokes 3 cigarettes daily- 12/01/17  Substance Use Topics  . Alcohol use: Yes    Comment: occasional  . Drug use: No     Allergies   Sulfa antibiotics, Chantix [varenicline tartrate], Fish allergy, Penicillins, and Latex   Review of Systems Review of Systems  All other systems reviewed and are negative.    Physical Exam Updated Vital Signs BP (!) 157/99 (BP Location: Right Arm)   Pulse 78   Temp 98 F (36.7 C) (Oral)   Resp 18   Ht 5\' 7"  (1.702 m)   Wt 108.9 kg   SpO2 97%   BMI 37.59 kg/m   Physical Exam Vitals signs and nursing note reviewed.  Constitutional:      General: She is not in acute distress.    Appearance: Normal appearance.  HENT:     Head: Normocephalic and atraumatic.  Pulmonary:     Effort: Pulmonary effort is normal.  Musculoskeletal:     Comments: The right lower extremity reveals 1+ edema.  There is tenderness in the right calf.  Homans sign is positive.  DP pulses are palpable and sensation is intact of the leg and foot.  Skin:    General: Skin  is warm and dry.  Neurological:     Mental Status: She is alert and oriented to person, place, and time.      ED Treatments / Results  Labs (all labs ordered are listed, but only abnormal results are displayed) Labs Reviewed - No data to display  EKG    Radiology No results found.  Procedures Procedures (including critical care time)  Medications Ordered in ED Medications  enoxaparin (LOVENOX) injection 110 mg (has no administration in time range)  HYDROcodone-acetaminophen (NORCO/VICODIN) 5-325 MG per  tablet 2 tablet (has no administration in time range)     Initial Impression / Assessment and Plan / ED Course  I have reviewed the triage vital signs and the nursing notes.  Pertinent labs & imaging results that were available during my care of the patient were reviewed by me and considered in my medical decision making (see chart for details).  Patient with right calf pain and history of prior DVT currently not on anticoagulation.  Concern today is for DVT.  Patient will be given subcu Lovenox and return in the morning for an ultrasound to rule this out.  Patient without chest pain or difficulty breathing and no signs or symptoms of PE.  Final Clinical Impressions(s) / ED Diagnoses   Final diagnoses:  None    ED Discharge Orders    None       Veryl Speak, MD 04/14/19 [phone]

## 2019-04-14 NOTE — Discharge Instructions (Signed)
Hydrocodone as prescribed as needed for pain.  Go to the [facility] vascular lab tomorrow morning for an ultrasound of your leg to rule out a blood clot.

## 2019-04-14 NOTE — Discharge Instructions (Addendum)
You were seen in the emergency department today for right leg pain.  Your ultrasound was negative for DVT.  Your blood work did not show significant electrolyte problems.  Your kidney function was similar to prior.  We will complete your symptoms may be related to a muscle strain/spasm.  You are sending you home with the following medicines to help with this:  - Diclofenac gel-this is an anti-inflammatory topical gel which we would like you to apply directly to your area of discomfort up to 4 times per day as needed. - Robaxin is the muscle relaxer I have prescribed, this is meant to help with muscle tightness. Be aware that this medication may make you drowsy therefore the first time you take this it should be at a time you are in an environment where you can rest. Do not drive or operate heavy machinery when taking this medication. Do not drink alcohol or take other sedating medications with this medicine such as narcotics or benzodiazepines.   You make take Tylenol per over the counter dosing with these medications.   We have prescribed you new medication(s) today. Discuss the medications prescribed today with your pharmacist as they can have adverse effects and interactions with your other medicines including over the counter and prescribed medications. Seek medical evaluation if you start to experience new or abnormal symptoms after taking one of these medicines, seek care immediately if you start to experience difficulty breathing, feeling of your throat closing, facial swelling, or rash as these could be indications of a more serious allergic reaction   We would like you to follow-up with your primary care provider and/or an orthopedic specialist in the next few days for reevaluation.  Please return to the ER for new or worsening symptoms including but not limited to worsening pain, increased swelling, redness, fever, numbness, weakness, or any other concerns.

## 2019-04-14 NOTE — Progress Notes (Signed)
Orthopedic Tech Progress Note Patient Details:  Krystal Allen 01-16-1956 [id number]  Ortho Devices Type of Ortho Device: Knee Immobilizer, Crutches Ortho Device/Splint Location: right Ortho Device/Splint Interventions: Application, Adjustment   Post Interventions Patient Tolerated: Well Instructions Provided: Care of device   [place] Pink 04/14/2019, 2:43 PM

## 2019-04-14 NOTE — ED Triage Notes (Signed)
Pt reports pain in right leg that has been ongoing since Tuesday. Pt denies any injury.

## 2019-04-14 NOTE — ED Notes (Signed)
Patient has a history of hypertension. Will take medication. Patient stable at discharge. Discharge paperwork reviewed and opportunity for questions allowed.

## 2019-04-14 NOTE — ED Provider Notes (Signed)
[facility]ER[facility] DEPARTMENT Provider Note   CSN: [id number] Arrival date & time: 04/14/19  1151     History   Chief Complaint Chief Complaint  Patient presents with   Leg Pain    HPI Krystal Allen is a 63 y.o. female w/ a hx of GERD, HTN, hyperlipidemia, COPD, & prior LLE DVT not on anticoagulation who returns to the emergency department for RLE pain x 6 days. Patient states pain is located in the back of the knee & radiates into the calf. The pain is constant, worse with movement & palpation, improvement with rest. Reports associated swelling. No known traumatic injury or activity change patient can recall, she woke up w/ the discomfort. Seen in ER last night- scheduled for outpatient doppler study which she had this AM prior to ER arrival- negative for DVT. She was discharged last night w/ a prescription for hydrocodone which she has not filled yet. Denies fever, chills, redness, numbness, or weakness.      HPI  Past Medical History:  Diagnosis Date   COPD (chronic obstructive pulmonary disease) (HCC)    Depression    GERD (gastroesophageal reflux disease)    Hernia, abdominal    History of chicken pox    Hyperlipidemia    Hypertension    Internal hemorrhoids with Grade 3 prolapse and bleeding 06/19/2013   Osteoarthritis    Personal history of colonic adenomas 06/25/2013    Patient Active Problem List   Diagnosis Date Noted   ILD (interstitial lung disease) ([place]) 01/26/2018   Cigarette smoker 01/26/2018   Osteoarthritis of cervical spine 12/22/2017   Prediabetes 12/22/2017   Pain of left calf 04/06/2017   Pulmonary emphysema ([place]) 02/23/2016   Obesity (BMI 30-39.9) 01/15/2016   Bilateral shoulder pain 01/15/2016   Esophageal reflux 01/15/2016   Tobacco abuse 05/13/2015   Lung nodule 08/14/2014   Impaired fasting glucose 08/14/2014   Hyperlipidemia 08/14/2014   Personal history of colonic adenomas 06/25/2013   HTN  (hypertension) 05/20/2013    Past Surgical History:  Procedure Laterality Date   ABDOMINAL HYSTERECTOMY  90yrs ago   due to heavy bleeding and fibroids    COLONOSCOPY     HEMORRHOID BANDING  2014   HEMORRHOID SURGERY  18EXH agao   UMBILICAL HERNIA REPAIR  06/2015     OB History   No obstetric history on file.      Home Medications    Prior to Admission medications   Medication Sig Start Date End Date Taking? Authorizing Provider  acetaminophen (TYLENOL 8 HOUR) 650 MG CR tablet Take 1 tablet (650 mg total) by mouth 2 (two) times daily as needed for pain. 04/06/17   Funches, Adriana Mccallum, MD  albuterol (PROVENTIL) (2.5 MG/3ML) 0.083% nebulizer solution Take 3 mLs (2.5 mg total) by nebulization every 6 (six) hours as needed for wheezing or shortness of breath. Patient not taking: Reported on 09/22/2018 11/08/17   Argentina Donovan, PA-C  Albuterol Sulfate (PROAIR RESPICLICK) 371 (90 Base) MCG/ACT AEPB Inhale 2 puffs into the lungs every 4 (four) hours as needed. 10/17/18   Argentina Donovan, PA-C  amLODipine (NORVASC) 10 MG tablet Take 1 tablet (10 mg total) by mouth daily. MUST MAKE APPT FOR FURTHER REFILLS 03/28/19   Ladell Pier, MD  aspirin EC 81 MG tablet Take 1 tablet (81 mg total) by mouth daily. Patient not taking: Reported on 09/22/2018 04/06/17   Boykin Nearing, MD  atorvastatin (LIPITOR) 40 MG tablet Take 1 tablet (40  mg total) by mouth daily. 04/06/17   Funches, Adriana Mccallum, MD  fluticasone (FLONASE) 50 MCG/ACT nasal spray Place 2 sprays into both nostrils daily. 10/17/18   Argentina Donovan, PA-C  Fluticasone-Salmeterol (ADVAIR) 250-50 MCG/DOSE AEPB 1 puff bid 10/17/18   Argentina Donovan, PA-C  HYDROcodone-acetaminophen (NORCO) 5-325 MG tablet Take 1-2 tablets by mouth every 6 (six) hours as needed. 04/14/19   Veryl Speak, MD  ibuprofen (ADVIL,MOTRIN) 800 MG tablet Take 1 tablet (800 mg total) by mouth 3 (three) times daily. 09/22/18   Vanessa Kick, MD  nicotine (NICODERM CQ  - DOSED IN MG/24 HR) 7 mg/24hr patch Place 1 patch (7 mg total) onto the skin daily. Patient not taking: Reported on 09/22/2018 01/26/18   Marshell Garfinkel, MD  omeprazole (PRILOSEC) 20 MG capsule Take 1 capsule (20 mg total) by mouth daily. 10/17/18   Argentina Donovan, PA-C  predniSONE (DELTASONE) 10 MG tablet 6,5,4,3,2,1 take each days dose all at once in am with food 10/17/18   Argentina Donovan, PA-C  Respiratory Therapy Supplies (FLUTTER) DEVI Use as directed 12/01/17   Marshell Garfinkel, MD    Family History Family History  Problem Relation Age of Onset   Cancer Father        prostate   Hypertension Mother    Diabetes Mother    Hypertension Sister    Heart disease Maternal Uncle    Stroke Maternal Grandmother    Hypertension Maternal Grandmother    Hypertension Sister    Colon cancer Neg Hx     Social History Social History   Tobacco Use   Smoking status: Current Every Day Smoker    Packs/day: 0.25    Types: Cigarettes   Smokeless tobacco: Never Used   Tobacco comment: smokes 3 cigarettes daily- 12/01/17  Substance Use Topics   Alcohol use: Yes    Comment: occasional   Drug use: No     Allergies   Sulfa antibiotics, Chantix [varenicline tartrate], Fish allergy, Penicillins, and Latex   Review of Systems Review of Systems  Constitutional: Negative for chills and fever.  Respiratory: Negative for shortness of breath.   Cardiovascular: Positive for leg swelling. Negative for chest pain.  Genitourinary: Negative for hematuria.  Musculoskeletal: Positive for arthralgias and myalgias. Negative for back pain.  Skin: Negative for color change and wound.  Neurological: Negative for weakness and numbness.    Physical Exam Updated Vital Signs BP 136/72 (BP Location: Right Arm)    Pulse 66    Temp 98.3 F (36.8 C) (Oral)    Resp 18    SpO2 98%   Physical Exam Vitals signs and nursing note reviewed.  Constitutional:      General: She is not in acute  distress.    Appearance: She is not ill-appearing or toxic-appearing.  HENT:     Head: Normocephalic and atraumatic.  Cardiovascular:     Rate and Rhythm: Normal rate and regular rhythm.     Pulses:          Dorsalis pedis pulses are 2+ on the right side and 2+ on the left side.       Posterior tibial pulses are 2+ on the right side and 2+ on the left side.  Pulmonary:     Effort: Pulmonary effort is normal.     Breath sounds: Normal breath sounds.  Abdominal:     General: There is no distension.     Tenderness: There is no abdominal tenderness.  Musculoskeletal:  Comments: Lower extremities: patient has trace edema noted to bilateral lower legs, not significant asymmetric.  No obvious deformity, erythema, ecchymosis, warmth, or open wounds. Patient has intact AROM to bilateral hips, knees, ankles, and all digits. Tender to palpation to the posterior aspect of the distal 1/3rd of the R upper leg, R posterior knee in the popliteal fossa area as well as R calf tenderness to palpation. NO point/focal bony tenderness. Compartments are soft.   Skin:    General: Skin is warm and dry.     Capillary Refill: Capillary refill takes less than 2 seconds.  Neurological:     Mental Status: She is alert.     Comments: Alert. Clear speech. Sensation grossly intact to bilateral lower extremities. 5/5 strength with plantar/dorsiflexion bilaterally. Patient able to transition from wheelchair to stretcher putting some weight on the RLE. Ambulatory w/ antalgic gait.   Psychiatric:        Mood and Affect: Mood normal.        Behavior: Behavior normal.    ED Treatments / Results  Labs (all labs ordered are listed, but only abnormal results are displayed) Labs Reviewed  I-STAT CHEM 8, ED - Abnormal; Notable for the following components:      Result Value   Creatinine, Ser 1.10 (*)    Calcium, Ion 1.12 (*)    Hemoglobin 15.3 (*)    All other components within normal limits  MAGNESIUM    EKG     Radiology Le Venous  Result Date: 04/14/2019  Lower Venous Study Indications: Pain, and Edema. (knee and foot)  Comparison Study: No prior study on file for comparison. Performing Technologist: Sharion Dove RVS  Examination Guidelines: A complete evaluation includes B-mode imaging, spectral Doppler, color Doppler, and power Doppler as needed of all accessible portions of each vessel. Bilateral testing is considered an integral part of a complete examination. Limited examinations for reoccurring indications may be performed as noted.  +---------+---------------+---------+-----------+----------+-------+  RIGHT     Compressibility Phasicity Spontaneity Properties Summary  +---------+---------------+---------+-----------+----------+-------+  CFV       Full            Yes       Yes                             +---------+---------------+---------+-----------+----------+-------+  SFJ       Full                                                      +---------+---------------+---------+-----------+----------+-------+  FV Prox   Full                                                      +---------+---------------+---------+-----------+----------+-------+  FV Mid    Full                                                      +---------+---------------+---------+-----------+----------+-------+  FV Distal Full                                                      +---------+---------------+---------+-----------+----------+-------+  PFV       Full                                                      +---------+---------------+---------+-----------+----------+-------+  POP       Full            Yes       Yes                             +---------+---------------+---------+-----------+----------+-------+  PTV       Full                                                      +---------+---------------+---------+-----------+----------+-------+  PERO      Full                                                       +---------+---------------+---------+-----------+----------+-------+   +----+---------------+---------+-----------+----------+-------+  LEFT Compressibility Phasicity Spontaneity Properties Summary  +----+---------------+---------+-----------+----------+-------+  CFV  Full            Yes       Yes                             +----+---------------+---------+-----------+----------+-------+     Summary: Right: There is no evidence of deep vein thrombosis in the lower extremity. Left: No evidence of common femoral vein obstruction.  *See table(s) above for measurements and observations.    Preliminary     Procedures Procedures (including critical care time)  SPLINT APPLICATION Date/Time: 6:96 PM Authorized by: Kennith Maes Consent: Verbal consent obtained. Risks and benefits: risks, benefits and alternatives were discussed Consent given by: patient Splint applied by: NT/RN staff Location details: RLE Splint type: knee immobilizer Supplies used: knee immoblizer Post-procedure: The splinted body part was neurovascularly unchanged following the procedure. Patient tolerance: Patient tolerated the procedure well with no immediate complications.   Medications Ordered in ED Medications  methocarbamol (ROBAXIN) tablet 750 mg (750 mg Oral Given 04/14/19 1310)     Initial Impression / Assessment and Plan / ED Course  I have reviewed the triage vital signs and the nursing notes.  Pertinent labs & imaging results that were available during my care of the patient were reviewed by me and considered in my medical decision making (see chart for details).   Patient returns to the ER w/ complaints of RLE Pain x 6 days. Nontoxic appearing, no apparent distress, vitals WNL. Seen in the ER last night for similar, given lovenox w/ plan for outpatient DVT study this AM- she had this and it was negative for DVT. No overlying erythema/warmth or fevers to suggest infectious process such as cellulitis,  osteomyelitis, or septic joint. Good intact AROM throughout. No point/focal bony tenderness or hx of trauma to suggest fx/dislocation. No neuro deficits, 2+ DP/PT pulses, compartments are soft do not suspect arterial emboli or compartment syndrome.  No back pain, spinal stenosis  seems less likely. Worse with palpation do not suspect claudication. Labs checked no significant electrolyte derangement or anemia. Kidney function appears @ baseline for the patient. Unclear definitive etiology to patient's sxs. Suspect muscular etiology given worse w/ movement & palpation of the calf muscles and posterior knee as well as distal posterior 1/3rd of the upper leg. Patient requesting crutches which will be provided as well as knee immobilizer to assist w/ stability. Robaxin & diclofenac gel prescribed. Discussed not to take robaxin w/ hydrocodone prescribed at prior ER visit as well as not to drive/operate heavy machinery with this medicine. Orthopedics follow up.  I discussed results, treatment plan, need for follow-up, and return precautions with the patient. Provided opportunity for questions, patient confirmed understanding and is in agreement with plan.    Final Clinical Impressions(s) / ED Diagnoses   Final diagnoses:  Right leg pain    ED Discharge Orders         Ordered    methocarbamol (ROBAXIN) 500 MG tablet  Every 8 hours PRN     04/14/19 1405    diclofenac sodium (VOLTAREN) 1 % GEL  4 times daily PRN     04/14/19 1405           Schwanda Zima, Glynda Jaeger, PA-C 04/14/19 1620    Pattricia Boss, MD 04/14/19 [phone]

## 2019-04-14 NOTE — ED Triage Notes (Signed)
Right leg pain since Tuesday. Seen at [facility], states she was negative for a blood clot. States it is painful to put weight on right leg and it is tender to touch behind the knee and in calf. Heard something pop in knee while ambulating last night

## 2019-04-15 MED FILL — METHOCARBAMOL 500 MG TABS: 500 | 5 days supply | Qty: 15 | Fill #0

## 2019-04-15 MED FILL — HYDROCODON-APAP 5-325: 5-325 | 2 days supply | Qty: 15 | Fill #0

## 2019-04-15 MED FILL — DICLOFENAC SODIUM 1 % GEL: 1 | 6 days supply | Qty: 100 | Fill #0

## 2019-04-16 IMAGING — DX DG CHEST 2V
2 series · 2 of 2 positions shown · non-contrast
Comparison: Chest radiograph, 07/23/2012.  Chest CT, 01/20/2016.

CLINICAL DATA: Per pt: sick for two days with cough, low grade
fever, sore throat, chest and back pain. History of Bronchitis. No
history of cardiac disease. HBP controlled with medication. Smoker
of 4+ cigarettes per day. Patient is not a diabetic

EXAM:
CHEST  2 VIEW

[chest pa]
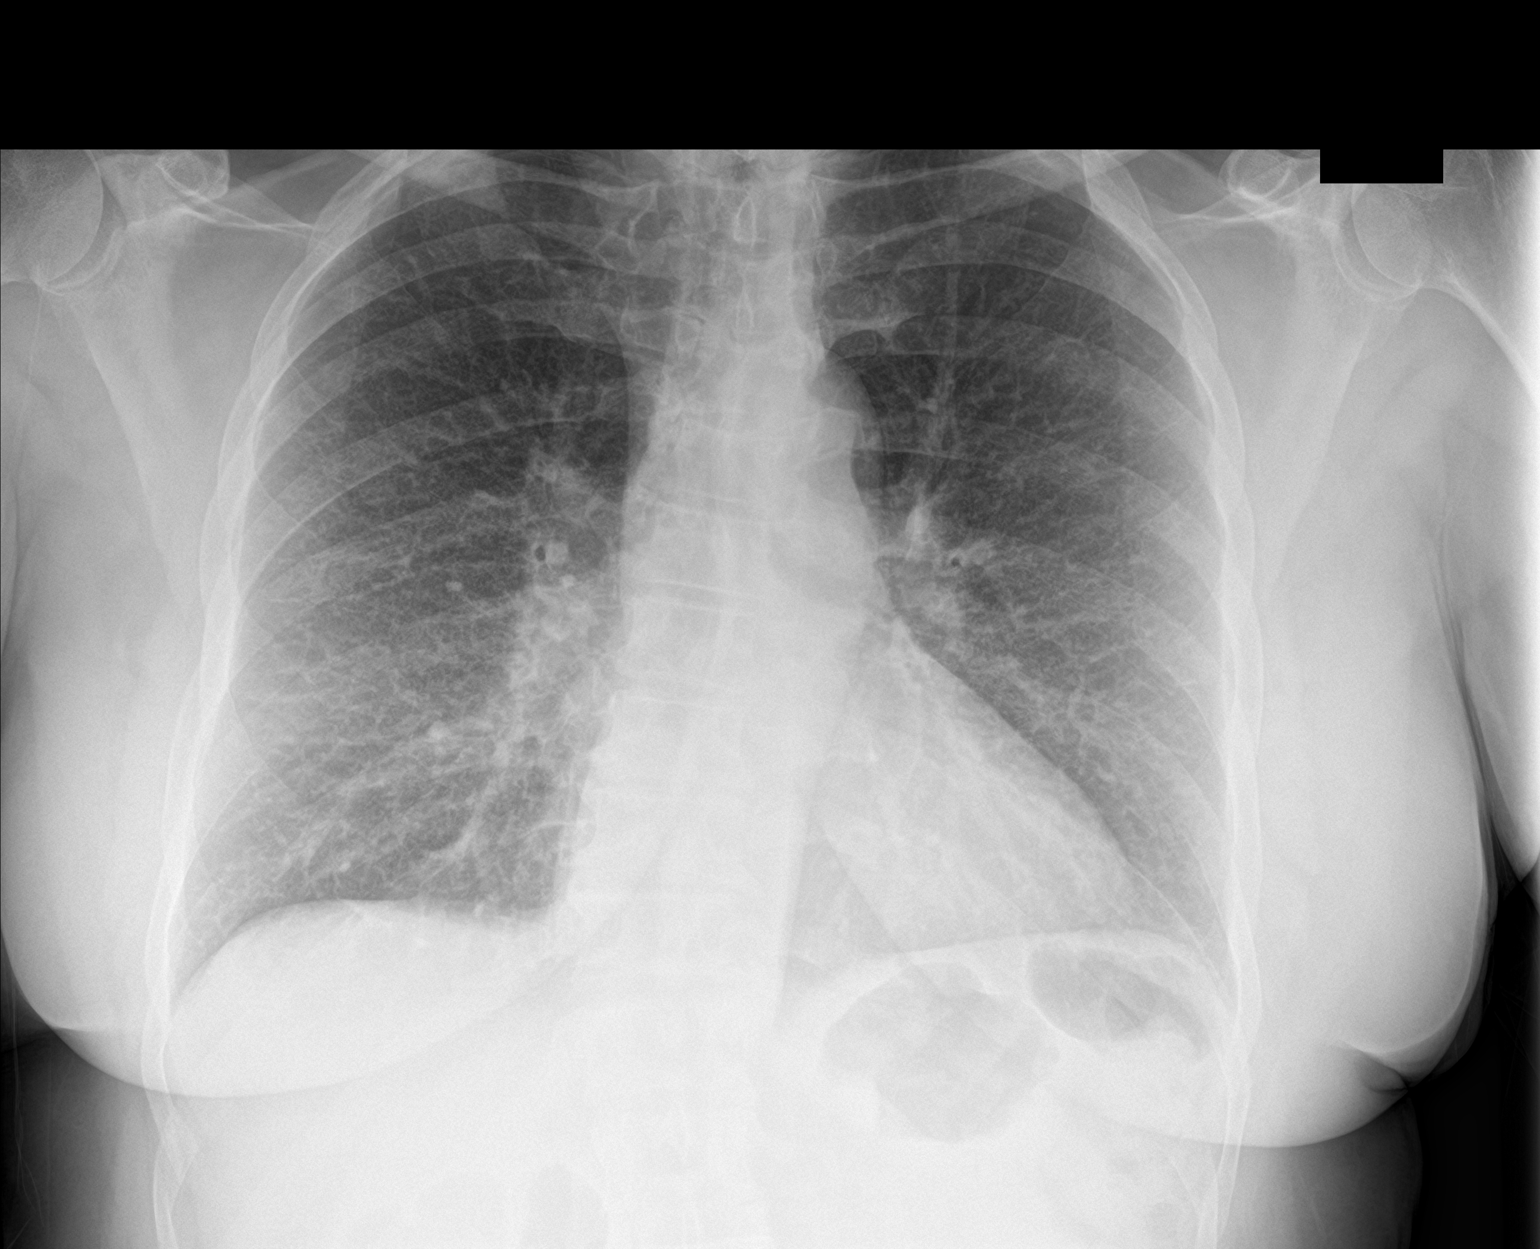

[chest lat]
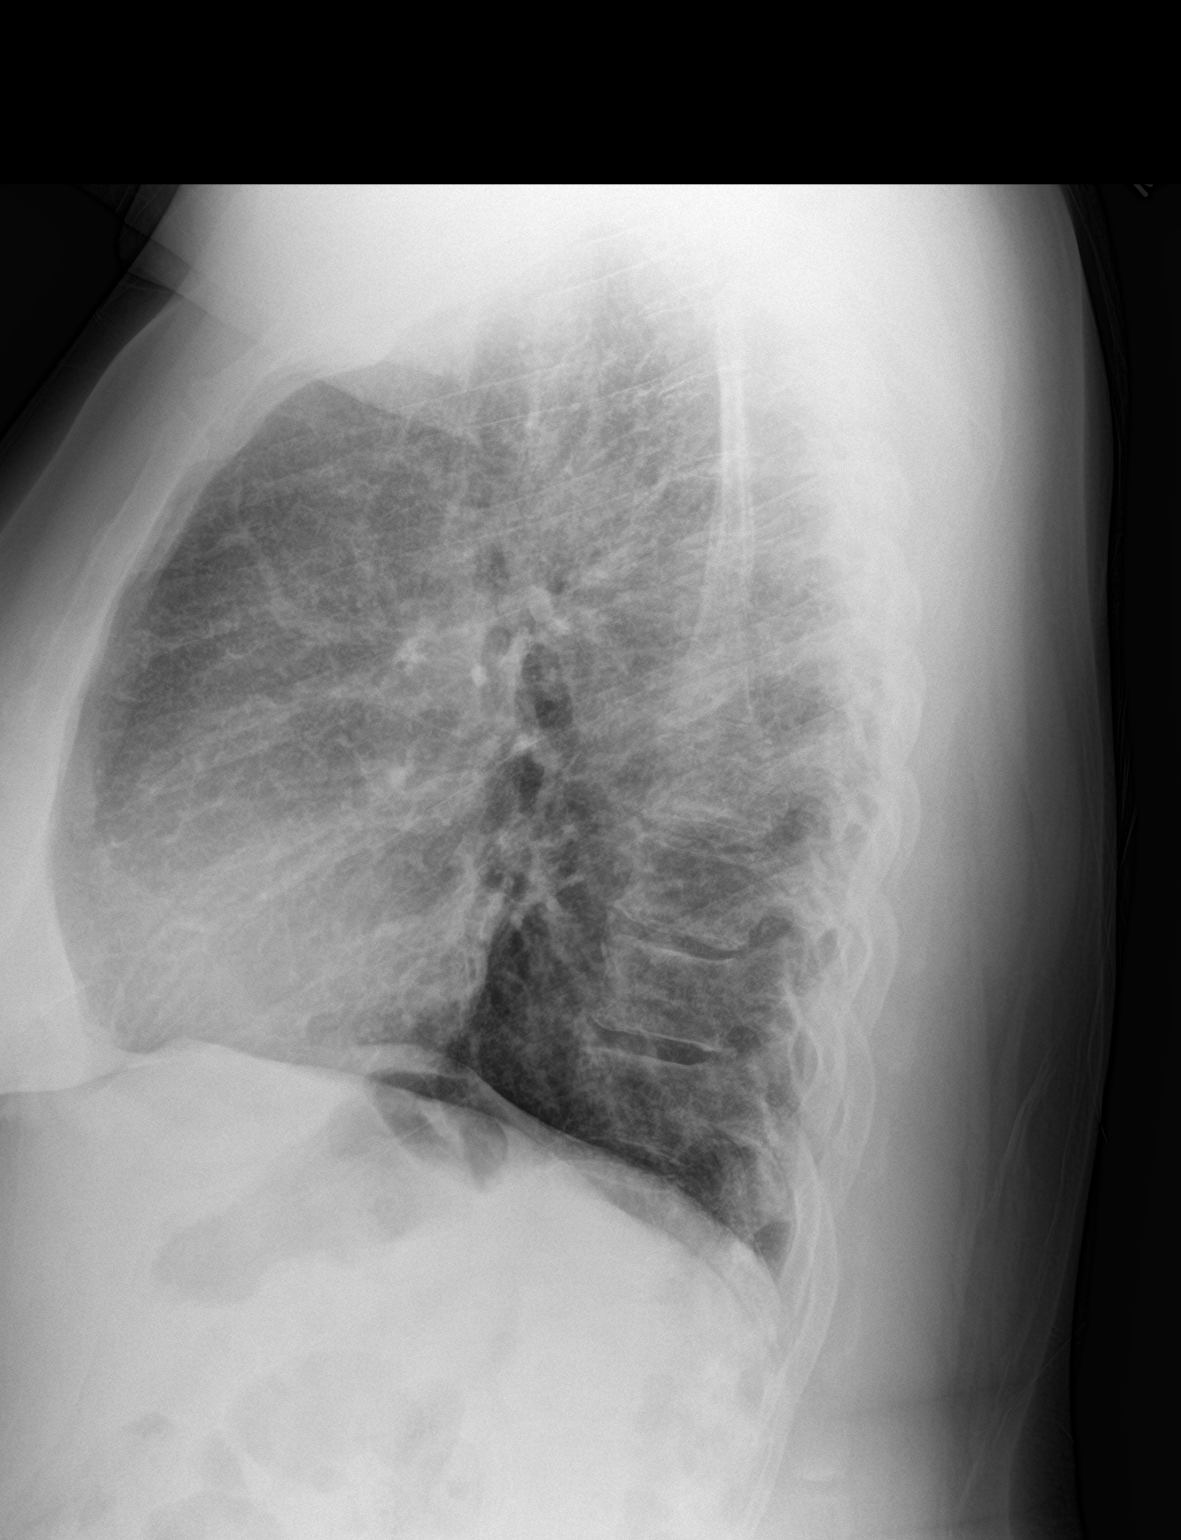

[2 of 2 positions shown; findings below may reference images not displayed]

FINDINGS: Cardiac silhouette is normal in size and configuration. No
mediastinal or hilar masses. No evidence of adenopathy.

Lungs show prominent bronchovascular markings diffusely, increased
when compared to the prior chest radiographs. This could a diffuse
to interstitial inflammatory process is more likely chronic
interstitial thickening related to smoking. There is no lung
consolidation to suggest pneumonia. No evidence of pulmonary edema.

No pleural effusion or pneumothorax.

Skeletal structures are intact.
IMPRESSION: 1. Prominent bronchovascular markings interstitial markings
increased when compared to the prior chest radiograph. This is most
likely worsened smoking related interstitial disease.
2. No pneumonia or pulmonary edema.

## 2019-04-22 DIAGNOSIS — M7121 Synovial cyst of popliteal space [Baker], right knee: Secondary | ICD-10-CM | POA: Diagnosis not present

## 2019-05-07 DIAGNOSIS — M7121 Synovial cyst of popliteal space [Baker], right knee: Secondary | ICD-10-CM | POA: Diagnosis not present

## 2019-05-08 ENCOUNTER — Telehealth (HOSPITAL_BASED_OUTPATIENT_CLINIC_OR_DEPARTMENT_OTHER): Payer: Self-pay | Admitting: Family Medicine

## 2019-05-08 ENCOUNTER — Encounter: Payer: Self-pay | Admitting: Family Medicine

## 2019-05-08 DIAGNOSIS — I10 Essential (primary) hypertension: Secondary | ICD-10-CM

## 2019-05-08 MED ORDER — AMLODIPINE BESYLATE 10 MG PO TABS: 10.0000 mg | ORAL_TABLET | Freq: Every day | ORAL | 1 refills | Status: DC

## 2019-05-08 MED FILL — AMLODIPINE BESYLATE 10 MG T: 10 | 90 days supply | Qty: 90 | Fill #0

## 2019-05-08 NOTE — Progress Notes (Signed)
Med refills

## 2019-05-08 NOTE — Progress Notes (Signed)
Virtual Visit via Telephone Note  I connected with Krystal Allen on 05/08/19 at  2:10 PM EDT by telephone and verified that I am speaking with the correct person using two identifiers.   I discussed the limitations, risks, security and privacy concerns of performing an evaluation and management service by telephone and the availability of in person appointments. I also discussed with the patient that there may be a patient responsible charge related to this service. The patient expressed understanding and agreed to proceed.  Patient Location: Home Provider Location: Office Others participating in call: Call initiated by Emilio Aspen, RMA   History of Present Illness:      63 year old female who was last seen in the office December 2019 who reports that when she recently called pharmacy regarding refills of her blood pressure medication, she was told that she needed to follow-up for doctor's visit prior to refill.  She reports no difficulty with her blood pressure.  She denies any headaches or dizziness and no issues with her blood pressure or with taking the medication.  Patient has recently been seen in the emergency department secondary to pain in her right leg and patient states she was told that there was a cyst in the back of her right leg that had burst.  She states that she was seen by Dr. Wilburn Mylar and was given a steroid injection into her knee and placed on a prednisone taper to help with the pain in her right leg.  Patient reports that her blood pressure was checked in the emergency department recently and it was good at that time.  Past Medical History:  Diagnosis Date  . COPD (chronic obstructive pulmonary disease) ([place])   . Depression   . GERD (gastroesophageal reflux disease)   . Hernia, abdominal   . History of chicken pox   . Hyperlipidemia   . Hypertension   . Internal hemorrhoids with Grade 3 prolapse and bleeding 06/19/2013  . Osteoarthritis   . Personal history of  colonic adenomas 06/25/2013    Past Surgical History:  Procedure Laterality Date  . ABDOMINAL HYSTERECTOMY  16yrs ago   due to heavy bleeding and fibroids   . COLONOSCOPY    . HEMORRHOID BANDING  2014  . HEMORRHOID SURGERY  15yrs agao  . UMBILICAL HERNIA REPAIR  06/2015    Family History  Problem Relation Age of Onset  . Cancer Father        prostate  . Hypertension Mother   . Diabetes Mother   . Hypertension Sister   . Heart disease Maternal Uncle   . Stroke Maternal Grandmother   . Hypertension Maternal Grandmother   . Hypertension Sister   . Colon cancer Neg Hx     Social History   Tobacco Use  . Smoking status: Current Every Day Smoker    Packs/day: 0.25    Types: Cigarettes  . Smokeless tobacco: Never Used  . Tobacco comment: smokes 3 cigarettes daily- 12/01/17  Substance Use Topics  . Alcohol use: Yes    Comment: occasional  . Drug use: No     Allergies  Allergen Reactions  . Sulfa Antibiotics Anaphylaxis and Hives  . Chantix [Varenicline Tartrate]     Bad dreams  . Fish Allergy Other (See Comments)    Severe stomach cramps   . Penicillins Other (See Comments)    Has patient had a PCN reaction causing immediate rash, facial/tongue/throat swelling, SOB or lightheadedness with hypotension: No Has patient had a PCN reaction  causing severe rash involving mucus membranes or skin necrosis: No Has patient had a PCN reaction that required hospitalization No Has patient had a PCN reaction occurring within the last 10 years: No If all of the above answers are "NO", then may proceed with Cephalosporin use.  Welts   . Latex Rash and Other (See Comments)    Dry skin       Observations/Objective: No vital signs or physical exam conducted as visit was done via telephone  Assessment and Plan: 1. Essential hypertension Patient is provided with a refill of her amlodipine which she takes for hypertension.  On review of notes from the emergency department, patient's blood  pressure was within normal at 136/72 at her emergency department visit on 04/14/2019.  She will continue use of amlodipine and a low-sodium diet.  I also encourage patient to call and schedule a follow-up visit with her primary care provider in the next 2 to 3 months for follow-up of her other chronic medical issues as well as any lab work that may be needed.  Patient was also encouraged to have an influenza immunization this fall but she reports that this is usually given at her workplace. - amLODipine (NORVASC) 10 MG tablet; Take 1 tablet (10 mg total) by mouth daily.  Dispense: 90 tablet; Refill: 1  Follow Up Instructions:Return in about 3 months (around 08/08/2019) for Chronic issues.    I discussed the assessment and treatment plan with the patient. The patient was provided an opportunity to ask questions and all were answered. The patient agreed with the plan and demonstrated an understanding of the instructions.   The patient was advised to call back or seek an in-person evaluation if the symptoms worsen or if the condition fails to improve as anticipated.  I provided 6 minutes of non-face-to-face time during this encounter.   Antony Blackbird, MD

## 2019-05-28 ENCOUNTER — Other Ambulatory Visit: Payer: Self-pay | Admitting: Psychiatry

## 2019-05-28 ENCOUNTER — Other Ambulatory Visit: Payer: Self-pay | Admitting: Orthopedic Surgery

## 2019-05-28 ENCOUNTER — Other Ambulatory Visit: Payer: Self-pay | Admitting: Student

## 2019-05-28 DIAGNOSIS — R52 Pain, unspecified: Secondary | ICD-10-CM

## 2019-05-28 DIAGNOSIS — R609 Edema, unspecified: Secondary | ICD-10-CM

## 2019-06-24 ENCOUNTER — Ambulatory Visit
Admission: RE | Admit: 2019-06-24 | Discharge: 2019-06-24 | Disposition: A | Discharge status: Home / Self Care | Payer: Self-pay | Source: Ambulatory Visit | Attending: Student | Admitting: Student

## 2019-06-24 ENCOUNTER — Other Ambulatory Visit: Payer: Self-pay

## 2019-06-24 DIAGNOSIS — R609 Edema, unspecified: Secondary | ICD-10-CM

## 2019-06-24 DIAGNOSIS — R52 Pain, unspecified: Secondary | ICD-10-CM

## 2019-06-26 ENCOUNTER — Other Ambulatory Visit: Payer: Self-pay

## 2019-06-26 ENCOUNTER — Ambulatory Visit: Payer: Self-pay | Attending: Internal Medicine

## 2019-07-05 ENCOUNTER — Telehealth: Payer: Self-pay | Admitting: Internal Medicine

## 2019-07-05 NOTE — Telephone Encounter (Signed)
Pt was informed that the CAFA will take 3 to 4 week to be review and right now only has been 2 days

## 2019-07-05 NOTE — Telephone Encounter (Signed)
Patient called wanting to get an update on her paperwork please follow up.

## 2019-07-11 ENCOUNTER — Other Ambulatory Visit: Payer: Self-pay

## 2019-07-11 ENCOUNTER — Ambulatory Visit (INDEPENDENT_AMBULATORY_CARE_PROVIDER_SITE_OTHER): Payer: Self-pay | Admitting: Orthopaedic Surgery

## 2019-07-11 ENCOUNTER — Ambulatory Visit: Payer: Self-pay

## 2019-07-11 ENCOUNTER — Encounter: Payer: Self-pay | Admitting: Orthopaedic Surgery

## 2019-07-11 VITALS — Ht 68.0 in | Wt 240.0 lb

## 2019-07-11 DIAGNOSIS — M1711 Unilateral primary osteoarthritis, right knee: Secondary | ICD-10-CM

## 2019-07-11 NOTE — Progress Notes (Signed)
Office Visit Note   Patient: Krystal Allen           Date of Birth: 10/03/1956           MRN: [id number] Visit Date: 07/11/2019              Requested by: Ladell Pier, MD [address] [place],  [place] [address] PCP: Ladell Pier, MD   Assessment & Plan: Visit Diagnoses:  1. Primary osteoarthritis of right knee     Plan: Impression is right knee end-stage of joint disease.  Plan of treatment of total knee replacement was discussed today with the patient.  Her daughter was present by phone.  She is in the process of applying for current financial assistance.  She also would like to send [place] x-rays from Dr. Doreatha Martin office.  She will follow-up with [place] as needed. Total face to face encounter time was greater than 45 minutes and over half of this time was spent in counseling and/or coordination of care.   Follow-Up Instructions: Return if symptoms worsen or fail to improve.   Orders:  Orders Placed This Encounter  Procedures  . XR Knee Complete 4 Views Right   No orders of the defined types were placed in this encounter.     Procedures: No procedures performed   Clinical Data: No additional findings.   Subjective: Chief Complaint  Patient presents with  . Right Knee - Pain    HPI patient is a 63 year old female who presents our clinic today with right knee pain.  This began approximately 3 months ago.  No known injury or change in activity.  She woke up one morning was unable to walk.  She was seen by what sounds like Dr. Doreatha Martin where her right knee was injected with cortisone.  She noticed relief, but only for 1 day.  Subsequent MRI obtained which showed moderate tricompartmental degenerative changes with a meniscal root tear which has subluxed.  She comes in today for further evaluation treatment recommendation.  Majority of her pain is to posterior aspect.  She describes this as a constant throb worse with ambulation and at night.  No locking or catching.   She has tried Tylenol with no relief of symptoms.  No numbness, tingling or burning.  No previous surgical intervention to the right knee.  Review of Systems as detailed in HPI.  All others reviewed and are negative.   Objective: Vital Signs: Ht 5\' 8"  (1.727 m)   Wt 240 lb (108.9 kg)   BMI 36.49 kg/m   Physical Exam well-developed well-nourished female no acute distress.  Alert and oriented x3.  Ortho Exam examination of the right knee shows a trace effusion.  Range of motion 0 to 115 degrees.  Medial and lateral joint line tenderness.  Moderate patellofemoral crepitus.  Limits are stable.  She is neurovascular intact distally.  Specialty Comments:  No specialty comments available.  Imaging: No new imaging   PMFS History: Patient Active Problem List   Diagnosis Date Noted  . ILD (interstitial lung disease) ([place]) 01/26/2018  . Cigarette smoker 01/26/2018  . Osteoarthritis of cervical spine 12/22/2017  . Prediabetes 12/22/2017  . Pain of left calf 04/06/2017  . Pulmonary emphysema ([place]) 02/23/2016  . Obesity (BMI 30-39.9) 01/15/2016  . Bilateral shoulder pain 01/15/2016  . Esophageal reflux 01/15/2016  . Tobacco abuse 05/13/2015  . Lung nodule 08/14/2014  . Impaired fasting glucose 08/14/2014  . Hyperlipidemia 08/14/2014  . Personal history of  colonic adenomas 06/25/2013  . HTN (hypertension) 05/20/2013   Past Medical History:  Diagnosis Date  . COPD (chronic obstructive pulmonary disease) ([place])   . Depression   . GERD (gastroesophageal reflux disease)   . Hernia, abdominal   . History of chicken pox   . Hyperlipidemia   . Hypertension   . Internal hemorrhoids with Grade 3 prolapse and bleeding 06/19/2013  . Osteoarthritis   . Personal history of colonic adenomas 06/25/2013    Family History  Problem Relation Age of Onset  . Cancer Father        prostate  . Hypertension Mother   . Diabetes Mother   . Hypertension Sister   . Heart disease Maternal Uncle   .  Stroke Maternal Grandmother   . Hypertension Maternal Grandmother   . Hypertension Sister   . Colon cancer Neg Hx     Past Surgical History:  Procedure Laterality Date  . ABDOMINAL HYSTERECTOMY  39yrs ago   due to heavy bleeding and fibroids   . COLONOSCOPY    . HEMORRHOID BANDING  2014  . HEMORRHOID SURGERY  70yrs agao  . UMBILICAL HERNIA REPAIR  06/2015   Social History   Occupational History  . Occupation: PCA    Employer: [employer]  Tobacco Use  . Smoking status: Current Every Day Smoker    Packs/day: 0.25    Types: Cigarettes  . Smokeless tobacco: Never Used  . Tobacco comment: smokes 3 cigarettes daily- 12/01/17  Substance and Sexual Activity  . Alcohol use: Yes    Comment: occasional  . Drug use: No  . Sexual activity: Not on file

## 2019-07-22 ENCOUNTER — Telehealth: Payer: Self-pay | Admitting: Internal Medicine

## 2019-07-22 DIAGNOSIS — Z1211 Encounter for screening for malignant neoplasm of colon: Secondary | ICD-10-CM

## 2019-07-22 DIAGNOSIS — Z1239 Encounter for other screening for malignant neoplasm of breast: Secondary | ICD-10-CM

## 2019-07-22 NOTE — Telephone Encounter (Signed)
Patient called stating she would like to get a mammogram and a colonoscopy referral. Pleas follow up.

## 2019-07-30 ENCOUNTER — Telehealth: Payer: Self-pay | Admitting: Internal Medicine

## 2019-07-30 NOTE — Telephone Encounter (Signed)
Pt was given the phone number of the breast center in [place] for her MMG as the order has been placed, however she would like more information on the FIT test she has to complete, please follow up

## 2019-08-08 NOTE — Telephone Encounter (Signed)
Instructions are inside of the bag, patient was advised of instructions in office and may reference the in hand instructions she has with the test.

## 2019-08-15 MED FILL — PROAIR RESPICLICK INHAL PWD: 108 (90 BAS | 17 days supply | Qty: 1 | Fill #0

## 2019-08-27 ENCOUNTER — Telehealth: Payer: Self-pay | Admitting: Internal Medicine

## 2019-08-27 NOTE — Telephone Encounter (Signed)
Contacted pt and made aware that Dr. Wynetta Emery has put in a order for the fit test and pt can come by and pick up the kit. Pt states she will come by tomorrow

## 2019-08-27 NOTE — Telephone Encounter (Signed)
Patient called and requested a referral for a colonoscopy. Patient states she called three weeks ago and no one has called her back about the referral.

## 2019-09-03 MED FILL — PROAIR RESPICLICK INHAL PWD: 108 (90 BAS | 17 days supply | Qty: 1 | Fill #1

## 2019-09-03 MED FILL — AMLODIPINE BESYLATE 10 MG T: 10 | 90 days supply | Qty: 90 | Fill #0

## 2019-12-27 ENCOUNTER — Other Ambulatory Visit: Payer: Self-pay | Admitting: Family Medicine

## 2019-12-27 DIAGNOSIS — I10 Essential (primary) hypertension: Secondary | ICD-10-CM

## 2019-12-27 MED FILL — AMLODIPINE BESYLATE 10 MG T: 10 | 30 days supply | Qty: 30 | Fill #0

## 2019-12-27 NOTE — Telephone Encounter (Signed)
Patient called and requested for listed medication to be refilled and send to [facility] pharmacy.  Amlodipine 10mg 

## 2020-02-04 ENCOUNTER — Telehealth: Payer: Self-pay | Admitting: Internal Medicine

## 2020-02-04 DIAGNOSIS — I10 Essential (primary) hypertension: Secondary | ICD-10-CM

## 2020-02-04 MED ORDER — AMLODIPINE BESYLATE 10 MG PO TABS: 10.0000 mg | ORAL_TABLET | Freq: Every day | ORAL | 0 refills | Status: DC

## 2020-02-04 MED FILL — AMLODIPINE BESYLATE 10 MG T: 10 | 30 days supply | Qty: 30 | Fill #0

## 2020-02-04 NOTE — Telephone Encounter (Signed)
Sent!

## 2020-02-04 NOTE — Telephone Encounter (Signed)
1) Medication(s) Requested (by name): amLODipine (NORVASC) 10 MG tablet        2) Pharmacy of Choice: [facility] pharmacy   3) Special Requests:   Approved medications will be sent to the pharmacy, we will reach out if there is an issue.  Requests made after 3pm may not be addressed until the following business day!  If a patient is unsure of the name of the medication(s) please note and ask patient to call back when they are able to provide all info, do not send to responsible party until all information is available!

## 2020-02-10 ENCOUNTER — Other Ambulatory Visit: Payer: Self-pay

## 2020-02-10 ENCOUNTER — Encounter: Payer: Self-pay | Admitting: Family

## 2020-02-10 ENCOUNTER — Ambulatory Visit: Payer: Self-pay | Attending: Family | Admitting: Family

## 2020-02-10 VITALS — BP 153/84 | HR 65 | Temp 98.1°F | Resp 16 | Wt 251.0 lb

## 2020-02-10 DIAGNOSIS — K219 Gastro-esophageal reflux disease without esophagitis: Secondary | ICD-10-CM

## 2020-02-10 DIAGNOSIS — I10 Essential (primary) hypertension: Secondary | ICD-10-CM

## 2020-02-10 DIAGNOSIS — Z1231 Encounter for screening mammogram for malignant neoplasm of breast: Secondary | ICD-10-CM

## 2020-02-10 MED ORDER — HYDROCHLOROTHIAZIDE 12.5 MG PO TABS: 12.5000 mg | ORAL_TABLET | Freq: Every day | ORAL | 0 refills | Status: DC

## 2020-02-10 MED ORDER — OMEPRAZOLE 20 MG PO CPDR: 20.0000 mg | DELAYED_RELEASE_CAPSULE | Freq: Every day | ORAL | 3 refills | Status: DC

## 2020-02-10 MED ORDER — AMLODIPINE BESYLATE 10 MG PO TABS: 10.0000 mg | ORAL_TABLET | Freq: Every day | ORAL | 2 refills | Status: DC

## 2020-02-10 MED FILL — HYDROCHLOROTHIAZIDE 12.5 MG: 12.5 | 30 days supply | Qty: 30 | Fill #0

## 2020-02-10 MED FILL — OMEPRAZOLE 20 MG CAP: 20 | 90 days supply | Qty: 90 | Fill #0

## 2020-02-10 NOTE — Progress Notes (Signed)
Patient ID: Krystal Allen, female    DOB: 1956-09-30  MRN: [id number]  CC: Hypertension follow-up  Subjective: Krystal Allen is a 64 y.o. female with history of hypertension, pulmonary emphysema, interstitial lung disease, esophageal reflux, impaired fasting glucose, osteoarthritis of cervical spine, lung nodule, hyperlipidemia, tobacco use, obesity, bilateral shoulder pain, personal history of colonic adenomas, pain of left calf, prediabetes, cigarette smoker who presents for hypertension follow-up.  1. HYPERTENSION FOLLOW-UP:   Currently taking: see medication list Med Adherence: [x]  Yes    []  No Medication side effects: []  Yes    [x]  No Adherence with salt restriction: [x]  Yes    []  No Exercise: Yes []  No [x]    Home Monitoring?: []  Yes    [x]  No  Monitoring Frequency: []  Yes    [x]  No Home BP results range: []  Yes    [x]  No  SOB? [x]  Yes, while walking both short/long distances, on level land and upstairs, when sitting down it subsides.  Positive history of pulmonary emphysema, interstitial lung disease, and cigarette smoking. Chest Pain?: []  Yes    [x]  No Leg swelling?: [x]  Yes, right knee was told need right knee replacement last year  Headaches?: []  Yes    [x]  No Dizziness? []  Yes    [x]  No Comments: Last visit July 2020 with Dr. Chapman Fitch.  During that encounter Amlodipine refill low-sodium diet encouraged.  Patient was advised to follow-up with primary care provider in the next 2 to 3 months for management of chronic medical issues in addition to labs. Reports in the past she was told not to take Lisinopril because it made her kidneys bad.  2. GERD FOLLOW-UP: Paitent complains of heartburn. This has been associated with feeling that food in stuck in throat..  She denies abdominal bloating, belching, belching and eructation, bilious reflux, chest pain, choking on food, cough, deep pressure at base of neck, dysphagia, early satiety, hematemesis, hoarseness, laryngitis, melena,  midespigastric pain, nausea, need to clear throat frequently, nocturnal burning, odynophagia, regurgitation of undigested food, unexpected weight loss and upper abdominal discomfort. Symptoms have been present for 8 years. She denies dysphagia.  She has not lost weight. She denies melena, hematochezia, hematemesis, and coffee ground emesis. Medical therapy in the past has included proton pump inhibitors.  3. MAMMOGRAM REFERRAL:  Requesting referral for mammogram. Reports an order was given last year but at the time she didn't have health insurance. Reports she currently has health insurance and would like referral as she is able to go for appointment at this time.  Patient Active Problem List   Diagnosis Date Noted  . ILD (interstitial lung disease) ([place]) 01/26/2018  . Cigarette smoker 01/26/2018  . Osteoarthritis of cervical spine 12/22/2017  . Prediabetes 12/22/2017  . Pain of left calf 04/06/2017  . Pulmonary emphysema ([place]) 02/23/2016  . Obesity (BMI 30-39.9) 01/15/2016  . Bilateral shoulder pain 01/15/2016  . Esophageal reflux 01/15/2016  . Tobacco abuse 05/13/2015  . Lung nodule 08/14/2014  . Impaired fasting glucose 08/14/2014  . Hyperlipidemia 08/14/2014  . Personal history of colonic adenomas 06/25/2013  . HTN (hypertension) 05/20/2013     Current Outpatient Medications on File Prior to Visit  Medication Sig Dispense Refill  . acetaminophen (TYLENOL 8 HOUR) 650 MG CR tablet Take 1 tablet (650 mg total) by mouth 2 (two) times daily as needed for pain. 60 tablet 5  . albuterol (PROVENTIL) (2.5 MG/3ML) 0.083% nebulizer solution Take 3 mLs (2.5 mg total) by nebulization every 6 (six)  hours as needed for wheezing or shortness of breath. (Patient not taking: Reported on 09/22/2018) 150 mL 1  . Albuterol Sulfate (PROAIR RESPICLICK) 903 (90 Base) MCG/ACT AEPB Inhale 2 puffs into the lungs every 4 (four) hours as needed. 1 each 2  . amLODipine (NORVASC) 10 MG tablet Take 1 tablet (10 mg  total) by mouth daily. Must have office visit for refills 30 tablet 0  . aspirin EC 81 MG tablet Take 1 tablet (81 mg total) by mouth daily. (Patient not taking: Reported on 09/22/2018) 30 tablet 11  . atorvastatin (LIPITOR) 40 MG tablet Take 1 tablet (40 mg total) by mouth daily. 30 tablet 11  . diclofenac sodium (VOLTAREN) 1 % GEL Apply 4 g topically 4 (four) times daily as needed (pain). 100 g 0  . fluticasone (FLONASE) 50 MCG/ACT nasal spray Place 2 sprays into both nostrils daily. 16 g 6  . Fluticasone-Salmeterol (ADVAIR) 250-50 MCG/DOSE AEPB 1 puff bid 180 each 4  . HYDROcodone-acetaminophen (NORCO) 5-325 MG tablet Take 1-2 tablets by mouth every 6 (six) hours as needed. (Patient not taking: Reported on 07/11/2019) 15 tablet 0  . ibuprofen (ADVIL,MOTRIN) 800 MG tablet Take 1 tablet (800 mg total) by mouth 3 (three) times daily. 21 tablet 0  . methocarbamol (ROBAXIN) 500 MG tablet Take 1 tablet (500 mg total) by mouth every 8 (eight) hours as needed for muscle spasms. (Patient not taking: Reported on 07/11/2019) 15 tablet 0  . nicotine (NICODERM CQ - DOSED IN MG/24 HR) 7 mg/24hr patch Place 1 patch (7 mg total) onto the skin daily. (Patient not taking: Reported on 09/22/2018) 28 patch 0  . omeprazole (PRILOSEC) 20 MG capsule Take 1 capsule (20 mg total) by mouth daily. 30 capsule 3  . predniSONE (DELTASONE) 10 MG tablet 6,5,4,3,2,1 take each days dose all at once in am with food (Patient not taking: Reported on 07/11/2019) 21 tablet 0  . Respiratory Therapy Supplies (FLUTTER) DEVI Use as directed 1 each 0   No current facility-administered medications on file prior to visit.    Allergies  Allergen Reactions  . Sulfa Antibiotics Anaphylaxis and Hives  . Chantix [Varenicline Tartrate]     Bad dreams  . Fish Allergy Other (See Comments)    Severe stomach cramps   . Penicillins Other (See Comments)    Has patient had a PCN reaction causing immediate rash, facial/tongue/throat swelling, SOB or  lightheadedness with hypotension: No Has patient had a PCN reaction causing severe rash involving mucus membranes or skin necrosis: No Has patient had a PCN reaction that required hospitalization No Has patient had a PCN reaction occurring within the last 10 years: No If all of the above answers are "NO", then may proceed with Cephalosporin use.  Welts   . Latex Rash and Other (See Comments)    Dry skin    Social History   Socioeconomic History  . Marital status: Married    Spouse name: Not on file  . Number of children: Not on file  . Years of education: 12+  . Highest education level: Not on file  Occupational History  . Occupation: PCA    Employer: [employer]  Tobacco Use  . Smoking status: Current Every Day Smoker    Packs/day: 0.25    Types: Cigarettes  . Smokeless tobacco: Never Used  . Tobacco comment: smokes 3 cigarettes daily- 12/01/17  Substance and Sexual Activity  . Alcohol use: Yes    Comment: occasional  . Drug use: No  .  Sexual activity: Not on file  Other Topics Concern  . Not on file  Social History Narrative   Regular exercise-no   Caffeine Use-   Social Determinants of Health   Financial Resource Strain:   . Difficulty of Paying Living Expenses:   Food Insecurity:   . Worried About Charity fundraiser in the Last Year:   . Arboriculturist in the Last Year:   Transportation Needs:   . Film/video editor (Medical):   Marland Kitchen Lack of Transportation (Non-Medical):   Physical Activity:   . Days of Exercise per Week:   . Minutes of Exercise per Session:   Stress:   . Feeling of Stress :   Social Connections:   . Frequency of Communication with Friends and Family:   . Frequency of Social Gatherings with Friends and Family:   . Attends Religious Services:   . Active Member of Clubs or Organizations:   . Attends Archivist Meetings:   Marland Kitchen Marital Status:   Intimate Partner Violence:   . Fear of Current or Ex-Partner:   . Emotionally Abused:    Marland Kitchen Physically Abused:   . Sexually Abused:     Family History  Problem Relation Age of Onset  . Cancer Father        prostate  . Hypertension Mother   . Diabetes Mother   . Hypertension Sister   . Heart disease Maternal Uncle   . Stroke Maternal Grandmother   . Hypertension Maternal Grandmother   . Hypertension Sister   . Colon cancer Neg Hx     Past Surgical History:  Procedure Laterality Date  . ABDOMINAL HYSTERECTOMY  13yrs ago   due to heavy bleeding and fibroids   . COLONOSCOPY    . HEMORRHOID BANDING  2014  . HEMORRHOID SURGERY  22yrs agao  . UMBILICAL HERNIA REPAIR  06/2015    ROS: Review of Systems Negative except as stated above  PHYSICAL EXAM: Vitals with BMI 02/10/2020 07/11/2019 04/14/2019  Height - 5\' 8"  -  Weight 251 lbs 240 lbs -  BMI - 86.7 -  Systolic 672 - 094  Diastolic 84 - 85  Pulse 65 - 67   Physical Exam General appearance - alert, well appearing, and in no distress and oriented to person, place, and time Mental status - alert, oriented to person, place, and time, normal mood, behavior, speech, dress, motor activity, and thought processes Eyes - pupils equal and reactive, extraocular eye movements intact Ears - bilateral TM's and external ear canals normal Nose - normal and patent, no erythema, discharge or polyps and normal nontender sinuses Mouth - mucous membranes moist, pharynx normal without lesions Neck - supple, no significant adenopathy Lymphatics - no palpable lymphadenopathy, no hepatosplenomegaly Chest - clear to auscultation, no wheezes, rales or rhonchi, symmetric air entry, no tachypnea, retractions or cyanosis Heart - normal rate, regular rhythm, normal S1, S2, no murmurs, rubs, clicks or gallops Neurological - alert, oriented, normal speech, no focal findings or movement disorder noted, neck supple without rigidity, cranial nerves II through XII intact, funduscopic exam normal, discs flat and sharp, DTR's normal and symmetric,  motor and sensory grossly normal bilaterally, normal muscle tone, no tremors, strength 5/5, abnormal neurological exam unchanged from prior examinations Musculoskeletal - no joint tenderness or deformity, no muscular tenderness noted, 1+ right knee edema Extremities - peripheral pulses normal, no pedal edema, no clubbing or cyanosis  ASSESSMENT AND PLAN: 1. Essential hypertension: -Blood pressure not at  goal. -We will add low-dose hydrochlorothiazide 12.5 mg total (1 tablet) daily by mouth. Counseled patient on possible side effects of hydrochlorothiazide such as but not limited to lightheadedness or dizziness.  Counseled patient to discontinue use immediately and notify primary provider should any of these occur. -Return in 2 weeks for blood pressure check with clinical pharmacist. -Continue amlodipine at prescribed dose. -We will collect a CMP and CBC to evaluate kidney function, liver function, and blood count during today's visit. -Follow-up with primary physician in 3 months or sooner if needed. -Counseled on blood pressure goal of less than 130/80, low-sodium, DASH diet, medication compliance, 150 minutes of moderate intensity exercise per week. Discussed medication compliance, adverse effects. - Comprehensive metabolic panel - CBC With Differential - hydrochlorothiazide (HYDRODIURIL) 12.5 MG tablet; Take 1 tablet (12.5 mg total) by mouth daily.  Dispense: 30 tablet; Refill: 0 - amLODipine (NORVASC) 10 MG tablet; Take 1 tablet (10 mg total) by mouth daily. Must have office visit for refills  Dispense: 30 tablet; Refill: 2  2. Gastroesophageal reflux disease without esophagitis: -Continue omeprazole management of GERD. - omeprazole (PRILOSEC) 20 MG capsule; Take 1 capsule (20 mg total) by mouth daily.  Dispense: 30 capsule; Refill: 3  3. Encounter for screening mammogram for malignant neoplasm of breast: -Referral placed for mammogram. - MM 3D SCREEN BREAST BILATERAL; Future   Patient  was given the opportunity to ask questions.  Patient verbalized understanding of the plan and was able to repeat key elements of the plan. Patient was given clear instructions to go to Emergency Department or return to medical center if symptoms don't improve, worsen, or new problems develop.The patient verbalized understanding.   Requested Prescriptions    No prescriptions requested or ordered in this encounter    Tarek Cravens Zachery Dauer, NP

## 2020-02-10 NOTE — Patient Instructions (Addendum)
Continue Amlodipine. Add low-dose hydrochlorothiazide daily. Return in 2 weeks for blood pressure check with clinical pharmacist. Return in 3 months for follow-up with primary physician. Omeprazole refilled. Mammogram referral placed. Hypertension, Adult Hypertension is another name for high blood pressure. High blood pressure forces your heart to work harder to pump blood. This can cause problems over time. There are two numbers in a blood pressure reading. There is a top number (systolic) over a bottom number (diastolic). It is best to have a blood pressure that is below 120/80. Healthy choices can help lower your blood pressure, or you may need medicine to help lower it. What are the causes? The cause of this condition is not known. Some conditions may be related to high blood pressure. What increases the risk?  Smoking.  Having type 2 diabetes mellitus, high cholesterol, or both.  Not getting enough exercise or physical activity.  Being overweight.  Having too much fat, sugar, calories, or salt (sodium) in your diet.  Drinking too much alcohol.  Having long-term (chronic) kidney disease.  Having a family history of high blood pressure.  Age. Risk increases with age.  Race. You may be at higher risk if you are African American.  Gender. Men are at higher risk than women before age 53. After age 37, women are at higher risk than men.  Having obstructive sleep apnea.  Stress. What are the signs or symptoms?  High blood pressure may not cause symptoms. Very high blood pressure (hypertensive crisis) may cause: ? Headache. ? Feelings of worry or nervousness (anxiety). ? Shortness of breath. ? Nosebleed. ? A feeling of being sick to your stomach (nausea). ? Throwing up (vomiting). ? Changes in how you see. ? Very bad chest pain. ? Seizures. How is this treated?  This condition is treated by making healthy lifestyle changes, such as: ? Eating healthy foods. ? Exercising  more. ? Drinking less alcohol.  Your health care provider may prescribe medicine if lifestyle changes are not enough to get your blood pressure under control, and if: ? Your top number is above 130. ? Your bottom number is above 80.  Your personal target blood pressure may vary. Follow these instructions at home: Eating and drinking   If told, follow the DASH eating plan. To follow this plan: ? Fill one half of your plate at each meal with fruits and vegetables. ? Fill one fourth of your plate at each meal with whole grains. Whole grains include whole-wheat pasta, brown rice, and whole-grain bread. ? Eat or drink low-fat dairy products, such as skim milk or low-fat yogurt. ? Fill one fourth of your plate at each meal with low-fat (lean) proteins. Low-fat proteins include fish, chicken without skin, eggs, beans, and tofu. ? Avoid fatty meat, cured and processed meat, or chicken with skin. ? Avoid pre-made or processed food.  Eat less than 1,500 mg of salt each day.  Do not drink alcohol if: ? Your doctor tells you not to drink. ? You are pregnant, may be pregnant, or are planning to become pregnant.  If you drink alcohol: ? Limit how much you use to:  0-1 drink a day for women.  0-2 drinks a day for men. ? Be aware of how much alcohol is in your drink. In the [place] one drink equals one 12 oz bottle of beer (355 mL), one 5 oz glass of wine (148 mL), or one 1 oz glass of hard liquor (44 mL). Lifestyle   Work with  your doctor to stay at a healthy weight or to lose weight. Ask your doctor what the best weight is for you.  Get at least 30 minutes of exercise most days of the week. This may include walking, swimming, or biking.  Get at least 30 minutes of exercise that strengthens your muscles (resistance exercise) at least 3 days a week. This may include lifting weights or doing Pilates.  Do not use any products that contain nicotine or tobacco, such as cigarettes, e-cigarettes,  and chewing tobacco. If you need help quitting, ask your doctor.  Check your blood pressure at home as told by your doctor.  Keep all follow-up visits as told by your doctor. This is important. Medicines  Take over-the-counter and prescription medicines only as told by your doctor. Follow directions carefully.  Do not skip doses of blood pressure medicine. The medicine does not work as well if you skip doses. Skipping doses also puts you at risk for problems.  Ask your doctor about side effects or reactions to medicines that you should watch for. Contact a doctor if you:  Think you are having a reaction to the medicine you are taking.  Have headaches that keep coming back (recurring).  Feel dizzy.  Have swelling in your ankles.  Have trouble with your vision. Get help right away if you:  Get a very bad headache.  Start to feel mixed up (confused).  Feel weak or numb.  Feel faint.  Have very bad pain in your: ? Chest. ? Belly (abdomen).  Throw up more than once.  Have trouble breathing. Summary  Hypertension is another name for high blood pressure.  High blood pressure forces your heart to work harder to pump blood.  For most people, a normal blood pressure is less than 120/80.  Making healthy choices can help lower blood pressure. If your blood pressure does not get lower with healthy choices, you may need to take medicine. This information is not intended to replace advice given to you by your health care provider. Make sure you discuss any questions you have with your health care provider. Document Revised: 06/27/2018 Document Reviewed: 06/27/2018 Elsevier Patient Education  2020 [employer].

## 2020-02-11 LAB — COMPREHENSIVE METABOLIC PANEL
ALT: 11 IU/L (ref 0–32)
AST: 12 IU/L (ref 0–40)
Albumin/Globulin Ratio: 1.1 — ABNORMAL LOW (ref 1.2–2.2)
Albumin: 3.8 g/dL (ref 3.8–4.8)
Alkaline Phosphatase: 135 IU/L — ABNORMAL HIGH (ref 39–117)
BUN/Creatinine Ratio: 12 (ref 12–28)
BUN: 13 mg/dL (ref 8–27)
Bilirubin Total: <0.2 mg/dL (ref 0.0–1.2)
CO2: 19 mmol/L — ABNORMAL LOW (ref 20–29)
Calcium: 9 mg/dL (ref 8.7–10.3)
Chloride: 109 mmol/L — ABNORMAL HIGH (ref 96–106)
Creatinine, Ser: 1.07 mg/dL — ABNORMAL HIGH (ref 0.57–1.00)
GFR calc Af Amer: 63 mL/min/{1.73_m2} (ref >59)
GFR calc non Af Amer: 55 mL/min/{1.73_m2} — ABNORMAL LOW (ref >59)
Globulin, Total: 3.6 g/dL (ref 1.5–4.5)
Glucose: 83 mg/dL (ref 65–99)
Potassium: 4.3 mmol/L (ref 3.5–5.2)
Sodium: 143 mmol/L (ref 134–144)
Total Protein: 7.4 g/dL (ref 6.0–8.5)

## 2020-02-11 LAB — CBC WITH DIFFERENTIAL
Basophils Absolute: 0.1 10*3/uL (ref 0.0–0.2)
Basos: 1 %
EOS (ABSOLUTE): 0.2 10*3/uL (ref 0.0–0.4)
Eos: 3 %
Hematocrit: 43.2 % (ref 34.0–46.6)
Hemoglobin: 14.4 g/dL (ref 11.1–15.9)
Immature Grans (Abs): 0 10*3/uL (ref 0.0–0.1)
Immature Granulocytes: 0 %
Lymphocytes Absolute: 2.2 10*3/uL (ref 0.7–3.1)
Lymphs: 36 %
MCH: 29.5 pg (ref 26.6–33.0)
MCHC: 33.3 g/dL (ref 31.5–35.7)
MCV: 89 fL (ref 79–97)
Monocytes Absolute: 0.4 10*3/uL (ref 0.1–0.9)
Monocytes: 7 %
Neutrophils Absolute: 3.2 10*3/uL (ref 1.4–7.0)
Neutrophils: 53 %
RBC: 4.88 x10E6/uL (ref 3.77–5.28)
RDW: 16.4 % — ABNORMAL HIGH (ref 11.7–15.4)
WBC: 6 10*3/uL (ref 3.4–10.8)

## 2020-02-13 ENCOUNTER — Ambulatory Visit
Admission: RE | Admit: 2020-02-13 | Discharge: 2020-02-13 | Disposition: A | Discharge status: Home / Self Care | Payer: Self-pay | Source: Ambulatory Visit | Attending: Family | Admitting: Family

## 2020-02-13 ENCOUNTER — Other Ambulatory Visit: Payer: Self-pay

## 2020-02-13 DIAGNOSIS — Z1231 Encounter for screening mammogram for malignant neoplasm of breast: Secondary | ICD-10-CM

## 2020-02-13 NOTE — Progress Notes (Signed)
Mammogram is negative. Patient will need repeat mammogram in 1 year as recommended.

## 2020-02-13 NOTE — Progress Notes (Signed)
Kidney function higher than normal. Compared to last visit kidney function has improved some.  Blood count normal meaning no anemia.

## 2020-02-24 ENCOUNTER — Other Ambulatory Visit: Payer: Self-pay

## 2020-02-24 ENCOUNTER — Other Ambulatory Visit: Payer: Self-pay | Admitting: Pharmacist

## 2020-02-24 ENCOUNTER — Encounter: Payer: Self-pay | Admitting: Pharmacist

## 2020-02-24 ENCOUNTER — Telehealth: Payer: Self-pay

## 2020-02-24 ENCOUNTER — Ambulatory Visit: Payer: Self-pay | Attending: Internal Medicine | Admitting: Pharmacist

## 2020-02-24 VITALS — BP 118/78 | HR 73

## 2020-02-24 DIAGNOSIS — I10 Essential (primary) hypertension: Secondary | ICD-10-CM

## 2020-02-24 DIAGNOSIS — J42 Unspecified chronic bronchitis: Secondary | ICD-10-CM

## 2020-02-24 DIAGNOSIS — M79662 Pain in left lower leg: Secondary | ICD-10-CM

## 2020-02-24 MED ORDER — ALBUTEROL SULFATE HFA 108 (90 BASE) MCG/ACT IN AERS: 2.0000 | INHALATION_SPRAY | RESPIRATORY_TRACT | 2 refills | Status: DC | PRN

## 2020-02-24 MED ORDER — PROAIR RESPICLICK 108 (90 BASE) MCG/ACT IN AEPB: 2.0000 | INHALATION_SPRAY | RESPIRATORY_TRACT | 2 refills | Status: DC | PRN

## 2020-02-24 MED ORDER — HYDROCHLOROTHIAZIDE 12.5 MG PO TABS: 12.5000 mg | ORAL_TABLET | Freq: Every day | ORAL | 2 refills | Status: DC

## 2020-02-24 MED FILL — ALBUTEROL SULFATE HFA 108 (: 108 (90 BAS | 16 days supply | Qty: 18 | Fill #0

## 2020-02-24 NOTE — Telephone Encounter (Signed)
Generic Proair HFA is covered at $0 thru pt's ins-can you change the respiclik to the hfa?

## 2020-02-24 NOTE — Telephone Encounter (Signed)
Rx sent 

## 2020-02-24 NOTE — Progress Notes (Signed)
   S:    PCP: Dr. Wynetta Emery  Patient arrives in good spirits. Presents to the clinic for BP check.    Patient was referred on 02/10/2020 by Amy. BP at that visit was 153/84 - HCTZ was added to her regimen. Patient was last seen by Primary Care Provider on 12/22/17.   Patient denies chest pain, dyspnea, HA or blurred vision. No LE edema.   Patient reports adherence with medications. Denies any side effects.   Current BP Medications include:  Amlodipine 10 mg daily, HCTZ 12.5 mg daily  Dietary habits include: limits salt; drinks coffee but only in the morning  Exercise habits include: none  Family / Social history:  - FHx: DM, HTN, heart disease, stroke - Tobacco: current every day smoker (0.25 PPD)  - Alcohol: occasional   O:  Vitals:   02/24/20 0909  BP: 118/78  Pulse: 73     Home BP readings: none; no cuff   Last 3 Office BP readings: BP Readings from Last 3 Encounters:  02/24/20 118/78  02/10/20 (!) 153/84  04/14/19 (!) 142/85    BMET    Component Value Date/Time   NA 143 02/10/2020 1000   K 4.3 02/10/2020 1000   CL 109 (H) 02/10/2020 1000   CO2 19 (L) 02/10/2020 1000   GLUCOSE 83 02/10/2020 1000   GLUCOSE 89 04/14/2019 1233   BUN 13 02/10/2020 1000   CREATININE 1.07 (H) 02/10/2020 1000   CREATININE 1.22 (H) 01/15/2016 1000   CALCIUM 9.0 02/10/2020 1000   GFRNONAA 55 (L) 02/10/2020 1000   GFRNONAA 48 (L) 01/15/2016 1000   GFRAA 63 02/10/2020 1000   GFRAA 56 (L) 01/15/2016 1000    Renal function: CrCl cannot be calculated (Unknown ideal weight.).  Clinical ASCVD: No  The 10-year ASCVD risk score Mikey Bussing DC Jr., et al., 2013) is: 15.2%   Values used to calculate the score:     Age: 64 years     Sex: Female     Is Non-Hispanic African American: Yes     Diabetic: No     Tobacco smoker: Yes     Systolic Blood Pressure: 962 mmHg     Is BP treated: Yes     HDL Cholesterol: 46 mg/dL     Total Cholesterol: 213 mg/dL   A/P: Hypertension longstanding  currently at goal on current medications. BP Goal = <130/80 mmHg. Patient endorses medication compliance.   -Continued current regimen; refills given.  -Counseled on lifestyle modifications for blood pressure control including reduced dietary sodium, increased exercise, adequate sleep  Results reviewed and written information provided.   Total time in face-to-face counseling 15 minutes.   F/U Clinic Visit with PCP.  Benard Halsted, PharmD, [place] [phone]

## 2020-02-25 ENCOUNTER — Ambulatory Visit: Payer: Self-pay | Admitting: Pharmacist

## 2020-03-03 ENCOUNTER — Other Ambulatory Visit: Payer: Self-pay | Admitting: Physician Assistant

## 2020-03-03 DIAGNOSIS — J42 Unspecified chronic bronchitis: Secondary | ICD-10-CM

## 2020-03-05 ENCOUNTER — Ambulatory Visit: Payer: Self-pay | Attending: Internal Medicine | Admitting: Physician Assistant

## 2020-03-05 ENCOUNTER — Other Ambulatory Visit: Payer: Self-pay

## 2020-03-05 DIAGNOSIS — J42 Unspecified chronic bronchitis: Secondary | ICD-10-CM

## 2020-03-05 MED ORDER — ALBUTEROL SULFATE HFA 108 (90 BASE) MCG/ACT IN AERS: 2.0000 | INHALATION_SPRAY | RESPIRATORY_TRACT | 2 refills | Status: DC | PRN

## 2020-03-05 MED ORDER — BENZONATATE 100 MG PO CAPS: 200.0000 mg | ORAL_CAPSULE | Freq: Three times a day (TID) | ORAL | 0 refills | Status: DC | PRN

## 2020-03-05 MED ORDER — GUAIFENESIN-CODEINE 100-10 MG/5ML PO SOLN: 5.0000 mL | Freq: Three times a day (TID) | ORAL | 0 refills | Status: DC | PRN

## 2020-03-05 MED ORDER — ALBUTEROL SULFATE (2.5 MG/3ML) 0.083% IN NEBU: INHALATION_SOLUTION | RESPIRATORY_TRACT | 1 refills | Status: DC

## 2020-03-05 MED ORDER — PREDNISONE 10 MG PO TABS: ORAL_TABLET | ORAL | 0 refills | Status: DC

## 2020-03-05 MED FILL — ALBUTEROL SUL 2.5 MG/3 ML S: (2.5 MG/3ML | 7 days supply | Qty: 90 | Fill #0

## 2020-03-05 MED FILL — GUAIATUSSIN AC LIQUID: 100-10 | 7 days supply | Qty: 118 | Fill #0

## 2020-03-05 MED FILL — predniSONE 10 MG TABS: 10 | 7 days supply | Qty: 21 | Fill #0

## 2020-03-05 MED FILL — BENZONATATE 100 MG CAPS: 100 | 6 days supply | Qty: 40 | Fill #0

## 2020-03-05 NOTE — Progress Notes (Signed)
Patient ID: Krystal Allen, female   DOB: 1956-06-01, 64 y.o.   MRN: [id number] Virtual Visit via Telephone Note  I connected with Krystal Allen on 03/05/20 at  1:30 PM EDT by telephone and verified that I am speaking with the correct person using two identifiers.   I discussed the limitations, risks, security and privacy concerns of performing an evaluation and management service by telephone and the availability of in person appointments. I also discussed with the patient that there may be a patient responsible charge related to this service. The patient expressed understanding and agreed to proceed.  PATIENT visit by telephone virtually in the context of Covid-19 pandemic. Patient location:  home My Location:  [place] office Persons on the call:  Me and the patient   History of Present Illness: Wheezing and coughing for several days.  Feels like it has felt in the past when she has needed prednisone.  Also requesting cough meds.  No fever.  No discolored mucus.  Wheezing has been increasing for 2-3 days.  "seems to happen this time of year."  She has not been exposed to Covid that she knows of.      Observations/Objective: NAD.  A&Ox3   Assessment and Plan: 1. Chronic bronchitis, unspecified chronic bronchitis type ([place]) with exacerbation-likely allergy related - predniSONE (DELTASONE) 10 MG tablet; 6,5,4,3,2,1 take each days dose all at once in am with food  Dispense: 21 tablet; Refill: 0 - benzonatate (TESSALON) 100 MG capsule; Take 2 capsules (200 mg total) by mouth 3 (three) times daily as needed.  Dispense: 40 capsule; Refill: 0 - albuterol (PROVENTIL) (2.5 MG/3ML) 0.083% nebulizer solution; TAKE 3 MLS BY NEBULIZATION EVERY 6 (SIX) HOURS AS NEEDED FOR WHEEZING OR SHORTNESS OF BREATH.  Dispense: 90 mL; Refill: 1 - albuterol (PROAIR HFA) 108 (90 Base) MCG/ACT inhaler; Inhale 2 puffs into the lungs every 4 (four) hours as needed for wheezing or shortness of breath.  Dispense: 8.5 g;  Refill: 2 - guaiFENesin-codeine 100-10 MG/5ML syrup; Take 5 mLs by mouth 3 (three) times daily as needed for cough.  Dispense: 118 mL; Refill: 0   Follow Up Instructions: Keep 7/13 f/up appt   I discussed the assessment and treatment plan with the patient. The patient was provided an opportunity to ask questions and all were answered. The patient agreed with the plan and demonstrated an understanding of the instructions.   The patient was advised to call back or seek an in-person evaluation if the symptoms worsen or if the condition fails to improve as anticipated.  I provided 11 minutes of non-face-to-face time during this encounter.   Freeman Caldron, PA-C

## 2020-05-05 ENCOUNTER — Other Ambulatory Visit: Payer: Self-pay | Admitting: Internal Medicine

## 2020-05-05 DIAGNOSIS — J42 Unspecified chronic bronchitis: Secondary | ICD-10-CM

## 2020-05-05 MED ORDER — ALBUTEROL SULFATE HFA 108 (90 BASE) MCG/ACT IN AERS: 2.0000 | INHALATION_SPRAY | RESPIRATORY_TRACT | 2 refills | Status: DC | PRN

## 2020-05-05 MED FILL — ALBUTEROL SULFATE HFA 108 (: 108 (90 BAS | 16 days supply | Qty: 9 | Fill #0

## 2020-05-05 NOTE — Telephone Encounter (Signed)
Requested medication (s) are due for refill today: Yes  Requested medication (s) are on the active medication list: Yes  Last refill: 10/17/18  Future visit scheduled: Yes  Notes to clinic:  Unable to refill per protocol, expired Rx     Requested Prescriptions  Pending Prescriptions Disp Refills   Fluticasone-Salmeterol (ADVAIR) 250-50 MCG/DOSE AEPB 180 each 4    Sig: 1 puff bid      Pulmonology:  Combination Products Passed - 05/05/2020 11:35 AM      Passed - Valid encounter within last 12 months    Recent Outpatient Visits           2 months ago Chronic bronchitis, unspecified chronic bronchitis type [facility])   [place], [place], [place]   2 months ago Essential hypertension   [place], Jarome Matin, RPH-CPP   2 months ago Essential hypertension   [place], [place], NP   12 months ago Essential hypertension   [place], [place], MD   1 year ago Hyperglycemia   [place], [place], [place]       Future Appointments             In 1 week Ladell Pier, MD [place]             Signed Prescriptions Disp Refills   albuterol (PROAIR HFA) 108 (90 Base) MCG/ACT inhaler 8.5 g 2    Sig: Inhale 2 puffs into the lungs every 4 (four) hours as needed for wheezing or shortness of breath.      Pulmonology:  Beta Agonists Failed - 05/05/2020 11:35 AM      Failed - One inhaler should last at least one month. If the patient is requesting refills earlier, contact the patient to check for uncontrolled symptoms.      Passed - Valid encounter within last 12 months    Recent Outpatient Visits           2 months ago Chronic bronchitis, unspecified chronic bronchitis type [facility])   [place], [place], [place]   2 months ago  Essential hypertension   [place], Jarome Matin, RPH-CPP   2 months ago Essential hypertension   [place], [place], NP   12 months ago Essential hypertension   [place], [place], MD   1 year ago Hyperglycemia   [place], [place]       Future Appointments             In 1 week Ladell Pier, MD [place]

## 2020-05-05 NOTE — Telephone Encounter (Signed)
Copied from [place] [id number]. Topic: Quick Communication - Rx Refill/Question >> May 05, 2020 11:28 AM Mcneil, Ja-Kwan wrote: Medication: albuterol (PROAIR HFA) 108 (90 Base) MCG/ACT inhaler and Fluticasone-Salmeterol (ADVAIR) 250-50 MCG/DOSE AEPB   Has the patient contacted their pharmacy? no  Preferred Pharmacy (with phone number or street name): [place], [place] [address] [place] Phone: [phone]   Fax: [phone]  Agent: Please be advised that RX refills may take up to 3 business days. We ask that you follow-up with your pharmacy.

## 2020-05-08 MED ORDER — FLUTICASONE-SALMETEROL 250-50 MCG/DOSE IN AEPB: INHALATION_SPRAY | RESPIRATORY_TRACT | 0 refills | Status: DC

## 2020-05-08 MED FILL — ADVAIR 250/50 DISKUS: 250-50 | 30 days supply | Qty: 60 | Fill #0

## 2020-05-12 ENCOUNTER — Other Ambulatory Visit: Payer: Self-pay

## 2020-05-12 ENCOUNTER — Ambulatory Visit: Payer: 59 | Attending: Internal Medicine | Admitting: Internal Medicine

## 2020-05-12 DIAGNOSIS — I10 Essential (primary) hypertension: Secondary | ICD-10-CM

## 2020-05-12 DIAGNOSIS — E785 Hyperlipidemia, unspecified: Secondary | ICD-10-CM

## 2020-05-12 DIAGNOSIS — Z7189 Other specified counseling: Secondary | ICD-10-CM

## 2020-05-12 DIAGNOSIS — I700 Atherosclerosis of aorta: Secondary | ICD-10-CM

## 2020-05-12 DIAGNOSIS — D126 Benign neoplasm of colon, unspecified: Secondary | ICD-10-CM | POA: Insufficient documentation

## 2020-05-12 DIAGNOSIS — F17200 Nicotine dependence, unspecified, uncomplicated: Secondary | ICD-10-CM

## 2020-05-12 DIAGNOSIS — J849 Interstitial pulmonary disease, unspecified: Secondary | ICD-10-CM

## 2020-05-12 DIAGNOSIS — J432 Centrilobular emphysema: Secondary | ICD-10-CM

## 2020-05-12 MED ORDER — NICOTINE POLACRILEX 2 MG MT GUM: CHEWING_GUM | OROMUCOSAL | 0 refills | Status: DC

## 2020-05-12 MED ORDER — ASPIRIN EC 81 MG PO TBEC: 81.0000 mg | DELAYED_RELEASE_TABLET | Freq: Every day | ORAL | 1 refills | Status: AC

## 2020-05-12 MED FILL — SM NICOTINE 2 MG CHEWING GU: 2 | 30 days supply | Qty: 50 | Fill #0

## 2020-05-12 NOTE — Progress Notes (Signed)
Virtual Visit via Telephone Note Due to current restrictions/limitations of in-office visits due to the COVID-19 pandemic, this scheduled clinical appointment was converted to a telehealth visit  I connected with Krystal Allen on 05/12/20 at 8:37 a.m by telephone and verified that I am speaking with the correct person using two identifiers. I am in my office.  The patient is at home.  Only the patient and myself participated in this encounter.  I discussed the limitations, risks, security and privacy concerns of performing an evaluation and management service by telephone and the availability of in person appointments. I also discussed with the patient that there may be a patient responsible charge related to this service. The patient expressed understanding and agreed to proceed.   History of Present Illness: Pt with hx of HTN, HL, COPD, ILD, GERD, preDM, tob dep, DJD c-spine, spinal stenosis.  Last visit with my nurse practitioner was 01/2020.  Purpose of today's visit is chronic disease management.  HYPERTENSION Currently taking: see medication list Med Adherence: [x]  Yes.  She is on Norvasc and hydrochlorothiazide.    []  No Medication side effects: []  Yes    [x]  No Adherence with salt restriction: [x]  Yes    []  No Home Monitoring?: [x]  Yes  Monitoring Frequency:  Not often.  Last blood check with our clinical pharmacist was normal. Home BP results range: []  Yes    []  No SOB? [x]  Yes    []  No Chest Pain?: []  Yes    [x]  No Leg swelling?: []  Yes    [x]  No Headaches?: []  Yes    [x]  No Dizziness? []  Yes    [x]  No Comments:   COPD/ILD/Tob:  Compliant with Advair and ProAir.  Uses ProAir BID.  Uses neb about once a mth "if my chest get very tight."  Feels breathing is stable. -down to smoking 1-2 cig/day from 1/3 pk/day. Trying to quit.  Not able to tolerate patches because they cause rash.  Would like to try the nicotine gum. -last saw Dr. Vaughan Allen in 2019.  Now has [name].  Over due for repeat c-scopy with Dr. Carlean Allen.  Hx of adenomatous polyp.  HL:  Taking and tolerating Lipitor.  Aortic Atherosclerosis on CT 2/20219.  Not taking ASA  HM:  Did not get COVID vaccine.  She is afraid to take it.  Outpatient Encounter Medications as of 05/12/2020  Medication Sig  . acetaminophen (TYLENOL 8 HOUR) 650 MG CR tablet Take 1 tablet (650 mg total) by mouth 2 (two) times daily as needed for pain.  Marland Kitchen albuterol (PROAIR HFA) 108 (90 Base) MCG/ACT inhaler Inhale 2 puffs into the lungs every 4 (four) hours as needed for wheezing or shortness of breath.  Marland Kitchen albuterol (PROVENTIL) (2.5 MG/3ML) 0.083% nebulizer solution TAKE 3 MLS BY NEBULIZATION EVERY 6 (SIX) HOURS AS NEEDED FOR WHEEZING OR SHORTNESS OF BREATH.  Marland Kitchen amLODipine (NORVASC) 10 MG tablet Take 1 tablet (10 mg total) by mouth daily. Must have office visit for refills  . aspirin EC 81 MG tablet Take 1 tablet (81 mg total) by mouth daily. (Patient not taking: Reported on 09/22/2018)  . atorvastatin (LIPITOR) 40 MG tablet Take 1 tablet (40 mg total) by mouth daily.  . benzonatate (TESSALON) 100 MG capsule Take 2 capsules (200 mg total) by mouth 3 (three) times daily as needed.  . diclofenac sodium (VOLTAREN) 1 % GEL Apply 4 g topically 4 (four) times daily as needed (pain).  . fluticasone (FLONASE) 50 MCG/ACT nasal  spray Place 2 sprays into both nostrils daily.  . Fluticasone-Salmeterol (ADVAIR) 250-50 MCG/DOSE AEPB 1 puff bid  . guaiFENesin-codeine 100-10 MG/5ML syrup Take 5 mLs by mouth 3 (three) times daily as needed for cough.  . hydrochlorothiazide (HYDRODIURIL) 12.5 MG tablet Take 1 tablet (12.5 mg total) by mouth daily.  Marland Kitchen HYDROcodone-acetaminophen (NORCO) 5-325 MG tablet Take 1-2 tablets by mouth every 6 (six) hours as needed. (Patient not taking: Reported on 07/11/2019)  . ibuprofen (ADVIL,MOTRIN) 800 MG tablet Take 1 tablet (800 mg total) by mouth 3 (three) times daily.  . methocarbamol (ROBAXIN) 500 MG  tablet Take 1 tablet (500 mg total) by mouth every 8 (eight) hours as needed for muscle spasms. (Patient not taking: Reported on 07/11/2019)  . nicotine (NICODERM CQ - DOSED IN MG/24 HR) 7 mg/24hr patch Place 1 patch (7 mg total) onto the skin daily. (Patient not taking: Reported on 09/22/2018)  . omeprazole (PRILOSEC) 20 MG capsule Take 1 capsule (20 mg total) by mouth daily.  . predniSONE (DELTASONE) 10 MG tablet 6,5,4,3,2,1 take each days dose all at once in am with food  . Respiratory Therapy Supplies (FLUTTER) DEVI Use as directed   No facility-administered encounter medications on file as of 05/12/2020.      Observations/Objective: Results for orders placed or performed in visit on 02/10/20  Comprehensive metabolic panel  Result Value Ref Range   Glucose 83 65 - 99 mg/dL   BUN 13 8 - 27 mg/dL   Creatinine, Ser 1.07 (H) 0.57 - 1.00 mg/dL   GFR calc non Af Amer 55 (L) >59 mL/min/1.73   GFR calc Af Amer 63 >59 mL/min/1.73   BUN/Creatinine Ratio 12 12 - 28   Sodium 143 134 - 144 mmol/L   Potassium 4.3 3.5 - 5.2 mmol/L   Chloride 109 (H) 96 - 106 mmol/L   CO2 19 (L) 20 - 29 mmol/L   Calcium 9.0 8.7 - 10.3 mg/dL   Total Protein 7.4 6.0 - 8.5 g/dL   Albumin 3.8 3.8 - 4.8 g/dL   Globulin, Total 3.6 1.5 - 4.5 g/dL   Albumin/Globulin Ratio 1.1 (L) 1.2 - 2.2   Bilirubin Total <0.2 0.0 - 1.2 mg/dL   Alkaline Phosphatase 135 (H) 39 - 117 IU/L   AST 12 0 - 40 IU/L   ALT 11 0 - 32 IU/L  CBC With Differential  Result Value Ref Range   WBC 6.0 3.4 - 10.8 x10E3/uL   RBC 4.88 3.77 - 5.28 x10E6/uL   Hemoglobin 14.4 11.1 - 15.9 g/dL   Hematocrit 43.2 34.0 - 46.6 %   MCV 89 79 - 97 fL   MCH 29.5 26.6 - 33.0 pg   MCHC 33.3 31 - 35 g/dL   RDW 16.4 (H) 11.7 - 15.4 %   Neutrophils 53 Not Estab. %   Lymphs 36 Not Estab. %   Monocytes 7 Not Estab. %   Eos 3 Not Estab. %   Basos 1 Not Estab. %   Neutrophils Absolute 3.2 1 - 7 x10E3/uL   Lymphocytes Absolute 2.2 0 - 3 x10E3/uL   Monocytes  Absolute 0.4 0 - 0 x10E3/uL   EOS (ABSOLUTE) 0.2 0.0 - 0.4 x10E3/uL   Basophils Absolute 0.1 0 - 0 x10E3/uL   Immature Granulocytes 0 Not Estab. %   Immature Grans (Abs) 0.0 0.0 - 0.1 x10E3/uL     Assessment and Plan: 1. Essential hypertension BP was good on follow-up visit with clinical pharmacist.  Continue amlodipine and hydrochlorothiazide.  Continue to limit salt in the foods.  2. Centrilobular emphysema ([place]) 3. Interstitial lung disease (HCC) Continue Advair inhaler.  Strongly advised to discontinue smoking.  We will get her back in with pulmonary for follow-up.  On her last visit with him, he had plan to repeat high-resolution CT of the chest in 1 year. - Ambulatory referral to Pulmonology  4. Tobacco dependence Advised to quit and commended her on her efforts so far..  Discussed methods to help her quit.  Patient wanting to try the [place] gum.  Prescription sent to the pharmacy.  Less than 5 minutes spent on counseling. - nicotine polacrilex (NICORETTE) 2 MG gum; 2 1 to 2 pieces as needed to decrease cravings. Max 30 pieces/day  Dispense: 100 tablet; Refill: 0  5. Hyperlipidemia, unspecified hyperlipidemia type Continue Lipitor. - Lipid panel; Future  6. Aortic atherosclerosis (HCC) Continue Lipitor.  Restart aspirin low-dose.  Encouraged to quit smoking - aspirin EC 81 MG tablet; Take 1 tablet (81 mg total) by mouth daily.  Dispense: 100 tablet; Refill: 1  7. Adenomatous polyp of colon, unspecified part of colon - Ambulatory referral to Gastroenterology  8. Educated about COVID-19 virus infection Advised patient that [employer] and [place] COVID-19 vaccine are relatively safe and effective.  I have encouraged her to get the vaccine given history of COPD and interstitial lung disease.   Follow Up Instructions: 3-4 mths   I discussed the assessment and treatment plan with the patient. The patient was provided an opportunity to ask questions and all were answered. The  patient agreed with the plan and demonstrated an understanding of the instructions.   The patient was advised to call back or seek an in-person evaluation if the symptoms worsen or if the condition fails to improve as anticipated.  I provided 17 minutes of non-face-to-face time during this encounter.   Karle Plumber, MD

## 2020-05-13 ENCOUNTER — Encounter: Payer: Self-pay | Admitting: Internal Medicine

## 2020-06-15 ENCOUNTER — Ambulatory Visit: Payer: Self-pay

## 2020-06-15 NOTE — Telephone Encounter (Signed)
Heavy cough, worse when supine. Cough is productive and is yellow in color. Pt stated the color of her phlegm can be white to clear at times. Is SOB and is wheezing (wheezing with cough episodes)- baseline lung issues- SOB is at baseline. Is coughing so hard that she voids on herself and passes gas. Wakes up coughing at night takes Albuterol MDI and alleviates the cough. No fever, no chest pain. Pt has h/o COPD.  Care advice given and pt verbalized understanding. No appts with PCP. Scheduled appt for 06/16/20 with Dr Margarita Rana.  Reason for Disposition . [1] Known COPD or other severe lung disease (i.e., bronchiectasis, cystic fibrosis, lung surgery) AND [2] worsening symptoms (i.e., increased sputum purulence or amount, increased breathing difficulty  Answer Assessment - Initial Assessment Questions 1. ONSET: "When did the cough begin?"      Last week  2. SEVERITY: "How bad is the cough today?"      Very strong cough causing pt to void and pass gas 3. SPUTUM: "Describe the color of your sputum" (none, dry cough; clear, white, yellow, green)     yellow 4. HEMOPTYSIS: "Are you coughing up any blood?" If so ask: "How much?" (flecks, streaks, tablespoons, etc.)     No  5. DIFFICULTY BREATHING: "Are you having difficulty breathing?" If Yes, ask: "How bad is it?" (e.g., mild, moderate, severe)    - MILD: No SOB at rest, mild SOB with walking, speaks normally in sentences, can lay down, no retractions, pulse < 100.    - MODERATE: SOB at rest, SOB with minimal exertion and prefers to sit, cannot lie down flat, speaks in phrases, mild retractions, audible wheezing, pulse 100-120.    - SEVERE: Very SOB at rest, speaks in single words, struggling to breathe, sitting hunched forward, retractions, pulse > 120      Just after coughing 6. FEVER: "Do you have a fever?" If Yes, ask: "What is your temperature, how was it measured, and when did it start?"     no 7. CARDIAC HISTORY: "Do you have any history of heart  disease?" (e.g., heart attack, congestive heart failure)     HTN 8. LUNG HISTORY: "Do you have any history of lung disease?"  (e.g., pulmonary embolus, asthma, emphysema)     COPD and asthma  9. PE RISK FACTORS: "Do you have a history of blood clots?" (or: recent major surgery, recent prolonged travel, bedridden)     Yes- left leg now resolved 10. OTHER SYMPTOMS: "Do you have any other symptoms?" (e.g., runny nose, wheezing, chest pain)       Nasal congestion wheezing worse worse with coughing at night with coughing fit 11. PREGNANCY: "Is there any chance you are pregnant?" "When was your last menstrual period?"      n/a 12. TRAVEL: "Have you traveled out of the country in the last month?" (e.g., travel history, exposures)      no  Protocols used: [place]

## 2020-06-16 ENCOUNTER — Ambulatory Visit: Payer: 59 | Attending: Family Medicine | Admitting: Family Medicine

## 2020-06-16 ENCOUNTER — Other Ambulatory Visit: Payer: Self-pay | Admitting: Family Medicine

## 2020-06-16 ENCOUNTER — Encounter: Payer: Self-pay | Admitting: Family Medicine

## 2020-06-16 DIAGNOSIS — I10 Essential (primary) hypertension: Secondary | ICD-10-CM | POA: Diagnosis not present

## 2020-06-16 DIAGNOSIS — R0982 Postnasal drip: Secondary | ICD-10-CM

## 2020-06-16 DIAGNOSIS — K5909 Other constipation: Secondary | ICD-10-CM | POA: Diagnosis not present

## 2020-06-16 DIAGNOSIS — N393 Stress incontinence (female) (male): Secondary | ICD-10-CM | POA: Diagnosis not present

## 2020-06-16 MED ORDER — CETIRIZINE HCL 10 MG PO TABS: 10.0000 mg | ORAL_TABLET | Freq: Every day | ORAL | 1 refills | Status: DC

## 2020-06-16 MED ORDER — HYDROCHLOROTHIAZIDE 12.5 MG PO TABS: 12.5000 mg | ORAL_TABLET | Freq: Every day | ORAL | 2 refills | Status: DC

## 2020-06-16 MED ORDER — BENZONATATE 100 MG PO CAPS: 100.0000 mg | ORAL_CAPSULE | Freq: Three times a day (TID) | ORAL | 0 refills | Status: DC | PRN

## 2020-06-16 MED ORDER — OXYBUTYNIN CHLORIDE 5 MG PO TABS: 5.0000 mg | ORAL_TABLET | Freq: Two times a day (BID) | ORAL | 3 refills | Status: DC

## 2020-06-16 MED ORDER — AMLODIPINE BESYLATE 10 MG PO TABS: 10.0000 mg | ORAL_TABLET | Freq: Every day | ORAL | 2 refills | Status: DC

## 2020-06-16 MED FILL — OXYBUTYNIN 5 MG TABLET: 5 | 30 days supply | Qty: 60 | Fill #0

## 2020-06-16 MED FILL — HYDROCHLOROTHIAZIDE 12.5 MG: 12.5 | 30 days supply | Qty: 30 | Fill #0

## 2020-06-16 MED FILL — AMLODIPINE BESYLATE 10 MG T: 10 | 30 days supply | Qty: 30 | Fill #0

## 2020-06-16 MED FILL — BENZONATATE 100 MG CAPS: 100 | 10 days supply | Qty: 30 | Fill #0

## 2020-06-16 MED FILL — CETIRIZINE HCL 10 MG TABS: 10 | 30 days supply | Qty: 30 | Fill #0

## 2020-06-16 NOTE — Progress Notes (Signed)
Virtual Visit via Telephone Note  I connected with Krystal Allen, on 06/16/2020 at 9:42 AM by telephone due to the COVID-19 pandemic and verified that I am speaking with the correct person using two identifiers.   Consent: I discussed the limitations, risks, security and privacy concerns of performing an evaluation and management service by telephone and the availability of in person appointments. I also discussed with the patient that there may be a patient responsible charge related to this service. The patient expressed understanding and agreed to proceed.   Location of Patient: Home  Location of Provider: Clinic   Persons participating in Telemedicine visit: Krystal Allen-CMA Dr. Margarita Rana     History of Present Illness: Krystal Allen is a 64 year old female patient of Dr Wynetta Emery with a history of hypertension, emphysema, interstitial lung disease, tobacco abuse who presents today for an acute visit.  She has had a cough for the last week with associated sputum production, which has been associated urinary accidents and passing gas when she coughs hard. She has post nasal drip and cough is worse at night. Denies presence of sinus tenderness, rhinorrhea. She feels constipated and bloated and is wondering what she can do for this.  Past Medical History:  Diagnosis Date  . COPD (chronic obstructive pulmonary disease) ([place])   . Depression   . GERD (gastroesophageal reflux disease)   . Hernia, abdominal   . History of chicken pox   . Hyperlipidemia   . Hypertension   . Internal hemorrhoids with Grade 3 prolapse and bleeding 06/19/2013  . Osteoarthritis   . Personal history of colonic adenomas 06/25/2013   Allergies  Allergen Reactions  . Sulfa Antibiotics Anaphylaxis and Hives  . Chantix [Varenicline Tartrate]     Bad dreams  . Fish Allergy Other (See Comments)    Severe stomach cramps   . Penicillins Other (See Comments)    Has patient had a PCN  reaction causing immediate rash, facial/tongue/throat swelling, SOB or lightheadedness with hypotension: No Has patient had a PCN reaction causing severe rash involving mucus membranes or skin necrosis: No Has patient had a PCN reaction that required hospitalization No Has patient had a PCN reaction occurring within the last 10 years: No If all of the above answers are "NO", then may proceed with Cephalosporin use.  Welts   . Latex Rash and Other (See Comments)    Dry skin    Current Outpatient Medications on File Prior to Visit  Medication Sig Dispense Refill  . acetaminophen (TYLENOL 8 HOUR) 650 MG CR tablet Take 1 tablet (650 mg total) by mouth 2 (two) times daily as needed for pain. 60 tablet 5  . albuterol (PROAIR HFA) 108 (90 Base) MCG/ACT inhaler Inhale 2 puffs into the lungs every 4 (four) hours as needed for wheezing or shortness of breath. 8.5 g 2  . albuterol (PROVENTIL) (2.5 MG/3ML) 0.083% nebulizer solution TAKE 3 MLS BY NEBULIZATION EVERY 6 (SIX) HOURS AS NEEDED FOR WHEEZING OR SHORTNESS OF BREATH. 90 mL 1  . amLODipine (NORVASC) 10 MG tablet Take 1 tablet (10 mg total) by mouth daily. Must have office visit for refills 30 tablet 2  . aspirin EC 81 MG tablet Take 1 tablet (81 mg total) by mouth daily. 100 tablet 1  . atorvastatin (LIPITOR) 40 MG tablet Take 1 tablet (40 mg total) by mouth daily. 30 tablet 11  . diclofenac sodium (VOLTAREN) 1 % GEL Apply 4 g topically 4 (four) times daily as needed (  pain). 100 g 0  . fluticasone (FLONASE) 50 MCG/ACT nasal spray Place 2 sprays into both nostrils daily. 16 g 6  . Fluticasone-Salmeterol (ADVAIR) 250-50 MCG/DOSE AEPB 1 puff bid 60 each 0  . guaiFENesin-codeine 100-10 MG/5ML syrup Take 5 mLs by mouth 3 (three) times daily as needed for cough. 118 mL 0  . ibuprofen (ADVIL,MOTRIN) 800 MG tablet Take 1 tablet (800 mg total) by mouth 3 (three) times daily. 21 tablet 0  . nicotine polacrilex (NICORETTE) 2 MG gum 2 1 to 2 pieces as needed  to decrease cravings. Max 30 pieces/day 100 tablet 0  . omeprazole (PRILOSEC) 20 MG capsule Take 1 capsule (20 mg total) by mouth daily. 30 capsule 3  . Respiratory Therapy Supplies (FLUTTER) DEVI Use as directed 1 each 0  . benzonatate (TESSALON) 100 MG capsule Take 2 capsules (200 mg total) by mouth 3 (three) times daily as needed. (Patient not taking: Reported on 06/16/2020) 40 capsule 0  . hydrochlorothiazide (HYDRODIURIL) 12.5 MG tablet Take 1 tablet (12.5 mg total) by mouth daily. 30 tablet 2  . predniSONE (DELTASONE) 10 MG tablet 6,5,4,3,2,1 take each days dose all at once in am with food (Patient not taking: Reported on 06/16/2020) 21 tablet 0   No current facility-administered medications on file prior to visit.    Observations/Objective: Awake, alert, oriented x3 Not in acute distress  Assessment and Plan: 1. Post-nasal drip - cetirizine (ZYRTEC) 10 MG tablet; Take 1 tablet (10 mg total) by mouth daily.  Dispense: 30 tablet; Refill: 1 - benzonatate (TESSALON) 100 MG capsule; Take 1 capsule (100 mg total) by mouth 3 (three) times daily as needed.  Dispense: 30 capsule; Refill: 0  2. Essential hypertension Stable Counseled on blood pressure goal of less than 130/80, low-sodium, DASH diet, medication compliance, 150 minutes of moderate intensity exercise per week. Discussed medication compliance, adverse effects. - amLODipine (NORVASC) 10 MG tablet; Take 1 tablet (10 mg total) by mouth daily. Must have office visit for refills  Dispense: 30 tablet; Refill: 2 - hydrochlorothiazide (HYDRODIURIL) 12.5 MG tablet; Take 1 tablet (12.5 mg total) by mouth daily.  Dispense: 30 tablet; Refill: 2  3. Stress incontinence of urine Discussed pathophysiology of stress incontinence, benefits and adverse effect of oxybutynin and through joint decision-making she would like to commence oxybutynin Discussed Kegel exercises - oxybutynin (DITROPAN) 5 MG tablet; Take 1 tablet (5 mg total) by mouth 2  (two) times daily.  Dispense: 60 tablet; Refill: 3  4. Other constipation Counseled on increasing fiber intake, water intake She will commence MiraLAX OTC   Follow Up Instructions: Scheduled for chronic disease management with PCP next month   I discussed the assessment and treatment plan with the patient. The patient was provided an opportunity to ask questions and all were answered. The patient agreed with the plan and demonstrated an understanding of the instructions.   The patient was advised to call back or seek an in-person evaluation if the symptoms worsen or if the condition fails to improve as anticipated.     I provided 14 minutes total of non-face-to-face time during this encounter including median intraservice time, reviewing previous notes, investigations, ordering medications, medical decision making, coordinating care and patient verbalized understanding at the end of the visit.     Charlott Rakes, MD, FAAFP. [facility] and [place], [place]   06/16/2020, 9:42 AM

## 2020-06-16 NOTE — Progress Notes (Signed)
States that she has had a cough since last week. Has no other symptoms.

## 2020-06-30 ENCOUNTER — Other Ambulatory Visit: Payer: Self-pay

## 2020-06-30 ENCOUNTER — Ambulatory Visit (AMBULATORY_SURGERY_CENTER): Payer: Self-pay | Admitting: *Deleted

## 2020-06-30 VITALS — Ht 68.0 in | Wt 248.0 lb

## 2020-06-30 DIAGNOSIS — Z86010 Personal history of colonic polyps: Secondary | ICD-10-CM

## 2020-06-30 MED ORDER — NA SULFATE-K SULFATE-MG SULF 17.5-3.13-1.6 GM/177ML PO SOLN: 1.0000 | Freq: Once | ORAL | 0 refills | Status: AC

## 2020-06-30 MED FILL — SUPREP BOWEL PREP KIT: 17.5-3.13-1 | 30 days supply | Qty: 354 | Fill #0

## 2020-06-30 NOTE — Progress Notes (Signed)
No egg or soy allergy known to patient  No issues with past sedation with any surgeries or procedures no intubation problems in the past  No FH of Malignant Hyperthermia No diet pills per patient No home 02 use per patient  No blood thinners per patient  Pt denies issues with constipation  No A fib or A flutter  EMMI video to pt or via MyChart  COVID 19 guidelines implemented in PV today with Pt and RN    Due to the COVID-19 pandemic we are asking patients to follow these guidelines. Please only bring one care partner. Please be aware that your care partner may wait in the car in the parking lot or if they feel like they will be too hot to wait in the car, they may wait in the lobby on the 4th floor. All care partners are required to wear a mask the entire time (we do not have any that we can provide them), they need to practice social distancing, and we will do a Covid check for all patient's and care partners when you arrive. Also we will check their temperature and your temperature. If the care partner waits in their car they need to stay in the parking lot the entire time and we will call them on their cell phone when the patient is ready for discharge so they can bring the car to the front of the building. Also all patient's will need to wear a mask into building. 

## 2020-07-07 ENCOUNTER — Telehealth: Payer: Self-pay | Admitting: Internal Medicine

## 2020-07-07 MED ORDER — BUSPIRONE HCL 5 MG PO TABS: 5.0000 mg | ORAL_TABLET | Freq: Two times a day (BID) | ORAL | 0 refills | Status: DC

## 2020-07-07 MED FILL — busPIRone HCL 5 MG TABS: 5 | 15 days supply | Qty: 30 | Fill #0

## 2020-07-07 NOTE — Telephone Encounter (Signed)
Patient loss her Aunt and would like PCP to prescribe anxiety medication, please advise    [place], [place] [employer] Phone:  [phone]  Fax:  [phone]

## 2020-07-07 NOTE — Telephone Encounter (Signed)
Will forward to pcp

## 2020-07-08 MED FILL — SUPREP BOWEL PREP KIT: 17.5-3.13-1 | 30 days supply | Qty: 354 | Fill #0

## 2020-07-08 NOTE — Telephone Encounter (Signed)
Contacted pt and made aware of rx being sent to pharmacy

## 2020-07-10 ENCOUNTER — Other Ambulatory Visit: Payer: Self-pay

## 2020-07-10 ENCOUNTER — Ambulatory Visit
Admission: EM | Admit: 2020-07-10 | Discharge: 2020-07-10 | Disposition: A | Discharge status: Home / Self Care | Payer: 59 | Attending: Emergency Medicine | Admitting: Emergency Medicine

## 2020-07-10 ENCOUNTER — Ambulatory Visit (INDEPENDENT_AMBULATORY_CARE_PROVIDER_SITE_OTHER): Payer: 59

## 2020-07-10 DIAGNOSIS — R0789 Other chest pain: Secondary | ICD-10-CM

## 2020-07-10 DIAGNOSIS — R062 Wheezing: Secondary | ICD-10-CM | POA: Diagnosis not present

## 2020-07-10 DIAGNOSIS — R05 Cough: Secondary | ICD-10-CM

## 2020-07-10 DIAGNOSIS — R059 Cough, unspecified: Secondary | ICD-10-CM

## 2020-07-10 DIAGNOSIS — R5383 Other fatigue: Secondary | ICD-10-CM

## 2020-07-10 DIAGNOSIS — J42 Unspecified chronic bronchitis: Secondary | ICD-10-CM

## 2020-07-10 DIAGNOSIS — R0602 Shortness of breath: Secondary | ICD-10-CM

## 2020-07-10 DIAGNOSIS — R0982 Postnasal drip: Secondary | ICD-10-CM

## 2020-07-10 DIAGNOSIS — F17200 Nicotine dependence, unspecified, uncomplicated: Secondary | ICD-10-CM

## 2020-07-10 MED ORDER — BENZONATATE 100 MG PO CAPS: 100.0000 mg | ORAL_CAPSULE | Freq: Three times a day (TID) | ORAL | 0 refills | Status: DC | PRN

## 2020-07-10 MED ORDER — DOXYCYCLINE HYCLATE 100 MG PO CAPS: 100.0000 mg | ORAL_CAPSULE | Freq: Two times a day (BID) | ORAL | 0 refills | Status: AC

## 2020-07-10 MED ORDER — ALBUTEROL SULFATE HFA 108 (90 BASE) MCG/ACT IN AERS: 2.0000 | INHALATION_SPRAY | RESPIRATORY_TRACT | 0 refills | Status: DC | PRN

## 2020-07-10 MED ORDER — PREDNISONE 10 MG (21) PO TBPK
ORAL_TABLET | Freq: Every day | ORAL | 0 refills | Status: DC
Start: 2020-07-10 — End: 2020-09-14

## 2020-07-10 MED FILL — predniSONE 10 MG TABS: 10 | 6 days supply | Qty: 21 | Fill #0

## 2020-07-10 MED FILL — BENZONATATE 100 MG CAPS: 100 | 10 days supply | Qty: 30 | Fill #0

## 2020-07-10 MED FILL — ALBUTEROL SULFATE HFA 108 (: 108 (90 BAS | 16 days supply | Qty: 9 | Fill #0

## 2020-07-10 MED FILL — DOXYCYCLINE HYCLATE 100 MG: 100 | 10 days supply | Qty: 10 | Fill #0

## 2020-07-10 NOTE — ED Provider Notes (Signed)
[facility]    CSN: [id number] Arrival date & time: 07/10/20  4235      History   Chief Complaint Chief Complaint  Patient presents with  . Cough    HPI Krystal Allen is a 64 y.o. female  Presenting for productive cough, increased dyspnea and wheezing x1 week.  Endorses history of COPD and bronchitis.  Sputum is thick, white: No blood, chest pain, palpitations, lower leg swelling.  Denies fever, thrashes, myalgias, nausea/vomiting, diarrhea.  Has taken home albuterol nebulizer with some relief.  Is Covid vaccinated, has not been tested since symptom onset.  No known sick exposures.  Past Medical History:  Diagnosis Date  . Allergy   . COPD (chronic obstructive pulmonary disease) ([place])   . Depression   . GERD (gastroesophageal reflux disease)   . Hernia, abdominal   . History of chicken pox   . Hyperlipidemia   . Hypertension   . Internal hemorrhoids with Grade 3 prolapse and bleeding 06/19/2013  . Osteoarthritis   . Personal history of colonic adenomas 06/25/2013    Patient Active Problem List   Diagnosis Date Noted  . Tobacco dependence 05/12/2020  . Aortic atherosclerosis ([place]) 05/12/2020  . Adenomatous polyp of colon 05/12/2020  . ILD (interstitial lung disease) ([place]) 01/26/2018  . Cigarette smoker 01/26/2018  . Osteoarthritis of cervical spine 12/22/2017  . Prediabetes 12/22/2017  . Pain of left calf 04/06/2017  . Centrilobular emphysema ([place]) 02/23/2016  . Obesity (BMI 30-39.9) 01/15/2016  . Bilateral shoulder pain 01/15/2016  . Esophageal reflux 01/15/2016  . Tobacco abuse 05/13/2015  . Lung nodule 08/14/2014  . Impaired fasting glucose 08/14/2014  . Hyperlipidemia 08/14/2014  . Personal history of colonic adenomas 06/25/2013  . HTN (hypertension) 05/20/2013    Past Surgical History:  Procedure Laterality Date  . ABDOMINAL HYSTERECTOMY  71yrs ago   due to heavy bleeding and fibroids   . COLONOSCOPY    . HEMORRHOID BANDING  2014  .  HEMORRHOID SURGERY  75yrs agao  . UMBILICAL HERNIA REPAIR  06/2015    OB History   No obstetric history on file.      Home Medications    Prior to Admission medications   Medication Sig Start Date End Date Taking? Authorizing Provider  acetaminophen (TYLENOL 8 HOUR) 650 MG CR tablet Take 1 tablet (650 mg total) by mouth 2 (two) times daily as needed for pain. 04/06/17   Funches, Adriana Mccallum, MD  albuterol (PROAIR HFA) 108 (90 Base) MCG/ACT inhaler Inhale 2 puffs into the lungs every 4 (four) hours as needed for wheezing or shortness of breath. 07/10/20   Hall-Potvin, [place], PA-C  albuterol (PROVENTIL) (2.5 MG/3ML) 0.083% nebulizer solution TAKE 3 MLS BY NEBULIZATION EVERY 6 (SIX) HOURS AS NEEDED FOR WHEEZING OR SHORTNESS OF BREATH. 03/05/20   Freeman Caldron M, PA-C  amLODipine (NORVASC) 10 MG tablet Take 1 tablet (10 mg total) by mouth daily. Must have office visit for refills 06/16/20   Charlott Rakes, MD  aspirin EC 81 MG tablet Take 1 tablet (81 mg total) by mouth daily. 05/12/20   Ladell Pier, MD  benzonatate (TESSALON) 100 MG capsule Take 1 capsule (100 mg total) by mouth 3 (three) times daily as needed. 07/10/20   Hall-Potvin, [place], PA-C  busPIRone (BUSPAR) 5 MG tablet Take 1 tablet (5 mg total) by mouth 2 (two) times daily. 07/07/20   Ladell Pier, MD  cetirizine (ZYRTEC) 10 MG tablet Take 1 tablet (10 mg total) by mouth daily.  06/16/20   Charlott Rakes, MD  doxycycline (VIBRAMYCIN) 100 MG capsule Take 1 capsule (100 mg total) by mouth 2 (two) times daily for 5 days. 07/10/20 07/15/20  Hall-Potvin, [place], PA-C  fluticasone (FLONASE) 50 MCG/ACT nasal spray Place 2 sprays into both nostrils daily. 10/17/18   Argentina Donovan, PA-C  Fluticasone-Salmeterol (ADVAIR) 250-50 MCG/DOSE AEPB 1 puff bid 05/08/20   Ladell Pier, MD  guaiFENesin-codeine 100-10 MG/5ML syrup Take 5 mLs by mouth 3 (three) times daily as needed for cough. 03/05/20   Argentina Donovan, PA-C    hydrochlorothiazide (HYDRODIURIL) 12.5 MG tablet Take 1 tablet (12.5 mg total) by mouth daily. 06/16/20 09/14/20  Charlott Rakes, MD  nicotine polacrilex (NICORETTE) 2 MG gum 2 1 to 2 pieces as needed to decrease cravings. Max 30 pieces/day 05/12/20   Ladell Pier, MD  omeprazole (PRILOSEC) 20 MG capsule Take 1 capsule (20 mg total) by mouth daily. 02/10/20   Camillia Herter, NP  oxybutynin (DITROPAN) 5 MG tablet Take 1 tablet (5 mg total) by mouth 2 (two) times daily. 06/16/20   Charlott Rakes, MD  predniSONE (STERAPRED UNI-PAK 21 TAB) 10 MG (21) TBPK tablet Take by mouth daily. Take steroid taper as written 07/10/20   Hall-Potvin, [place], PA-C  Respiratory Therapy Supplies (FLUTTER) DEVI Use as directed 12/01/17   Marshell Garfinkel, MD    Family History Family History  Problem Relation Age of Onset  . Cancer Father        prostate  . Hypertension Mother   . Diabetes Mother   . Hypertension Sister   . Heart disease Maternal Uncle   . Stroke Maternal Grandmother   . Hypertension Maternal Grandmother   . Hypertension Sister   . Colon cancer Neg Hx   . Stomach cancer Neg Hx   . Rectal cancer Neg Hx   . Colon polyps Neg Hx     Social History Social History   Tobacco Use  . Smoking status: Current Every Day Smoker    Packs/day: 0.25    Types: E-cigarettes  . Smokeless tobacco: Never Used  . Tobacco comment: 06/30/20 has cut down on cigarettes using gum and occasional vaping  Vaping Use  . Vaping Use: Every day  Substance Use Topics  . Alcohol use: Yes    Comment: occasional  . Drug use: No     Allergies   Sulfa antibiotics, Chantix [varenicline tartrate], Fish allergy, Penicillins, and Latex   Review of Systems As per HPI   Physical Exam Triage Vital Signs ED Triage Vitals  Enc Vitals Group     BP 07/10/20 0913 (!) 169/84     Pulse Rate 07/10/20 0913 74     Resp 07/10/20 0913 18     Temp 07/10/20 0913 98.2 F (36.8 C)     Temp Source 07/10/20 0913 Oral      SpO2 07/10/20 0913 93 %     Weight --      Height --      Head Circumference --      Peak Flow --      Pain Score 07/10/20 0915 0     Pain Loc --      Pain Edu? --      Excl. in [place]? --    No data found.  Updated Vital Signs BP (!) 169/84 (BP Location: Left Arm)   Pulse 74   Temp 98.2 F (36.8 C) (Oral)   Resp 18   SpO2 93%   Visual  Acuity Right Eye Distance:   Left Eye Distance:   Bilateral Distance:    Right Eye Near:   Left Eye Near:    Bilateral Near:     Physical Exam Constitutional:      General: She is not in acute distress.    Appearance: She is obese. She is not ill-appearing or diaphoretic.  HENT:     Head: Normocephalic and atraumatic.     Mouth/Throat:     Mouth: Mucous membranes are moist.     Pharynx: Oropharynx is clear. No oropharyngeal exudate or posterior oropharyngeal erythema.  Eyes:     General: No scleral icterus.    Conjunctiva/sclera: Conjunctivae normal.     Pupils: Pupils are equal, round, and reactive to light.  Neck:     Comments: Trachea midline, negative JVD Cardiovascular:     Rate and Rhythm: Normal rate and regular rhythm.     Heart sounds: No murmur heard.  No gallop.   Pulmonary:     Effort: Pulmonary effort is normal. No respiratory distress.     Breath sounds: Wheezing and rhonchi present. No rales.     Comments: Diffuse, moderate.  No prolonged expiratory phase. Musculoskeletal:     Cervical back: Neck supple. No tenderness.  Lymphadenopathy:     Cervical: No cervical adenopathy.  Skin:    Capillary Refill: Capillary refill takes less than 2 seconds.     Coloration: Skin is not jaundiced or pale.     Findings: No rash.  Neurological:     General: No focal deficit present.     Mental Status: She is alert and oriented to person, place, and time.      UC Treatments / Results  Labs (all labs ordered are listed, but only abnormal results are displayed) Labs Reviewed  NOVEL CORONAVIRUS, NAA     EKG   Radiology DG Chest 2 View  Result Date: 07/10/2020 CLINICAL DATA:  64 year old female with productive cough, chest tightness, fatigue, wheezing, shortness of breath. Smoker. Suspected pulmonary Langerhans cell histiocytosis, as well as Emphysema. EXAM: CHEST - 2 VIEW COMPARISON:  High-resolution chest CT 12/15/2017 and earlier. FINDINGS: Lung volumes and mediastinal contours have not significantly changed since 2018, within normal limits. Visualized tracheal air column is within normal limits. Chronic coarse bilateral pulmonary interstitial opacity appears stable. No pneumothorax, pleural effusion or acute pulmonary opacity. Scoliosis. No acute osseous abnormality identified. Negative visible bowel gas pattern. IMPRESSION: Stable chronic lung disease. No superimposed acute findings are identified. Electronically Signed   By: Genevie Ann M.D.   On: 07/10/2020 09:52    Procedures Procedures (including critical care time)  Medications Ordered in UC Medications - No data to display  Initial Impression / Assessment and Plan / UC Course  I have reviewed the triage vital signs and the nursing notes.  Pertinent labs & imaging results that were available during my care of the patient were reviewed by me and considered in my medical decision making (see chart for details).     Patient afebrile, nontoxic, with SpO2 93%.  Chest x-ray without acute abnormality.  Covid PCR pending.  Patient to quarantine until results are back.  We will treat supportively as outlined below.  Return precautions discussed, patient verbalized understanding and is agreeable to plan. Final Clinical Impressions(s) / UC Diagnoses   Final diagnoses:  Cough     Discharge Instructions     Tessalon for cough. Start flonase, atrovent nasal spray for nasal congestion/drainage. You can use over the counter  nasal saline rinse such as neti pot for nasal congestion. Keep hydrated, your urine should be clear to pale yellow  in color. Tylenol/motrin for fever and pain. Monitor for any worsening of symptoms, chest pain, shortness of breath, wheezing, swelling of the throat, go to the emergency department for further evaluation needed.     ED Prescriptions    Medication Sig Dispense Auth. Provider   benzonatate (TESSALON) 100 MG capsule Take 1 capsule (100 mg total) by mouth 3 (three) times daily as needed. 30 capsule Hall-Potvin, [place], PA-C   predniSONE (STERAPRED UNI-PAK 21 TAB) 10 MG (21) TBPK tablet Take by mouth daily. Take steroid taper as written 21 tablet Hall-Potvin, [place], PA-C   doxycycline (VIBRAMYCIN) 100 MG capsule Take 1 capsule (100 mg total) by mouth 2 (two) times daily for 5 days. 10 capsule Hall-Potvin, [place], PA-C   albuterol (PROAIR HFA) 108 (90 Base) MCG/ACT inhaler Inhale 2 puffs into the lungs every 4 (four) hours as needed for wheezing or shortness of breath. 18 g Hall-Potvin, [place], PA-C     PDMP not reviewed this encounter.   Hall-Potvin, [place], [place] 07/10/20 1049

## 2020-07-10 NOTE — ED Triage Notes (Signed)
Pt c/o productive cough with thick white sputum, chest tightness, wheezing, and SOB on exertion x1wk. States hx of bronchitis and COPD. States fully covid vaccinated and refuse covid testing at this time.

## 2020-07-10 NOTE — Discharge Instructions (Addendum)

## 2020-07-13 LAB — NOVEL CORONAVIRUS, NAA: SARS-CoV-2, NAA: NOT DETECTED

## 2020-07-14 ENCOUNTER — Encounter: Payer: 59 | Admitting: Internal Medicine

## 2020-07-14 ENCOUNTER — Encounter: Payer: Self-pay | Admitting: Gastroenterology

## 2020-07-14 ENCOUNTER — Other Ambulatory Visit: Payer: Self-pay

## 2020-07-14 ENCOUNTER — Ambulatory Visit (AMBULATORY_SURGERY_CENTER): Payer: 59 | Admitting: Gastroenterology

## 2020-07-14 VITALS — BP 148/89 | HR 52 | Temp 97.5°F | Resp 22 | Ht 68.0 in | Wt 248.0 lb

## 2020-07-14 DIAGNOSIS — D122 Benign neoplasm of ascending colon: Secondary | ICD-10-CM

## 2020-07-14 DIAGNOSIS — D123 Benign neoplasm of transverse colon: Secondary | ICD-10-CM

## 2020-07-14 DIAGNOSIS — Z86010 Personal history of colonic polyps: Secondary | ICD-10-CM | POA: Diagnosis not present

## 2020-07-14 DIAGNOSIS — K649 Unspecified hemorrhoids: Secondary | ICD-10-CM

## 2020-07-14 MED ORDER — SODIUM CHLORIDE 0.9 % IV SOLN: 500.0000 mL | Freq: Once | INTRAVENOUS | Status: DC

## 2020-07-14 MED ORDER — PROCTOFOAM HC 1-1 % EX FOAM: 1.0000 | Freq: Two times a day (BID) | CUTANEOUS | 0 refills | Status: DC

## 2020-07-14 NOTE — Patient Instructions (Signed)
Handouts provided on polyps, diverticulosis, hemorrhoids and High-fiber diet.   ProctoFoam-HC: apply externally to rectum twice daily for 10 days.  Take Sitz baths twice daily for 7 days.  If still problems with hemorrhoids, recommend surgical evaluation.   Follow High-fiber diet. Take Benefiber daily to prevent constipation and straining.   YOU HAD AN ENDOSCOPIC PROCEDURE TODAY AT [facility]:   Refer to the procedure report that was given to you for any specific questions about what was found during the examination.  If the procedure report does not answer your questions, please call your gastroenterologist to clarify.  If you requested that your care partner not be given the details of your procedure findings, then the procedure report has been included in a sealed envelope for you to review at your convenience later.  YOU SHOULD EXPECT: Some feelings of bloating in the abdomen. Passage of more gas than usual.  Walking can help get rid of the air that was put into your GI tract during the procedure and reduce the bloating. If you had a lower endoscopy (such as a colonoscopy or flexible sigmoidoscopy) you may notice spotting of blood in your stool or on the toilet paper. If you underwent a bowel prep for your procedure, you may not have a normal bowel movement for a few days.  Please Note:  You might notice some irritation and congestion in your nose or some drainage.  This is from the oxygen used during your procedure.  There is no need for concern and it should clear up in a day or so.  SYMPTOMS TO REPORT IMMEDIATELY:   Following lower endoscopy (colonoscopy or flexible sigmoidoscopy):  Excessive amounts of blood in the stool  Significant tenderness or worsening of abdominal pains  Swelling of the abdomen that is new, acute  Fever of 100F or higher  For urgent or emergent issues, a gastroenterologist can be reached at any hour by calling [phone]. Do not use  MyChart messaging for urgent concerns.    DIET:  We do recommend a small meal at first, but then you may proceed to your regular diet.  Drink plenty of fluids but you should avoid alcoholic beverages for 24 hours.  ACTIVITY:  You should plan to take it easy for the rest of today and you should NOT DRIVE or use heavy machinery until tomorrow (because of the sedation medicines used during the test).    FOLLOW UP: Our staff will call the number listed on your records 48-72 hours following your procedure to check on you and address any questions or concerns that you may have regarding the information given to you following your procedure. If we do not reach you, we will leave a message.  We will attempt to reach you two times.  During this call, we will ask if you have developed any symptoms of COVID 19. If you develop any symptoms (ie: fever, flu-like symptoms, shortness of breath, cough etc.) before then, please call [phone].  If you test positive for Covid 19 in the 2 weeks post procedure, please call and report this information to [place].    If any biopsies were taken you will be contacted by phone or by letter within the next 1-3 weeks.  Please call us at [phone] if you have not heard about the biopsies in 3 weeks.    SIGNATURES/CONFIDENTIALITY: You and/or your care partner have signed paperwork which will be entered into your electronic medical record.  These signatures attest  to the fact that that the information above on your After Visit Summary has been reviewed and is understood.  Full responsibility of the confidentiality of this discharge information lies with you and/or your care-partner.

## 2020-07-14 NOTE — Progress Notes (Signed)
To PACU, VSS. Report to [email or web address] 

## 2020-07-14 NOTE — Progress Notes (Signed)
Pt's states no medical or surgical changes since previsit or office visit. 

## 2020-07-14 NOTE — Op Note (Signed)
[facility] Patient Name: Krystal Allen Procedure Date: 07/14/2020 11:08 AM MRN: [id number] Endoscopist: Jackquline Denmark , MD Age: 64 Referring MD:  Date of Birth: 1956-03-19 Gender: Female Account #: [id number] Procedure:                Colonoscopy Indications:              High risk colon cancer surveillance: Personal                            history of colonic polyps Medicines:                Monitored Anesthesia Care Procedure:                Pre-Anesthesia Assessment:                           - Prior to the procedure, a History and Physical                            was performed, and patient medications and                            allergies were reviewed. The patient's tolerance of                            previous anesthesia was also reviewed. The risks                            and benefits of the procedure and the sedation                            options and risks were discussed with the patient.                            All questions were answered, and informed consent                            was obtained. Prior Anticoagulants: The patient has                            taken no previous anticoagulant or antiplatelet                            agents. ASA Grade Assessment: III - A patient with                            severe systemic disease. After reviewing the risks                            and benefits, the patient was deemed in                            satisfactory condition to undergo the procedure.  After obtaining informed consent, the colonoscope                            was passed under direct vision. Throughout the                            procedure, the patient's blood pressure, pulse, and                            oxygen saturations were monitored continuously. The                            Colonoscope was introduced through the anus and                            advanced to the the cecum, identified  by                            appendiceal orifice and ileocecal valve. The                            colonoscopy was performed without difficulty. The                            patient tolerated the procedure well. The quality                            of the bowel preparation was good. The ileocecal                            valve, appendiceal orifice, and rectum were                            photographed. Scope In: 11:13:59 AM Scope Out: 11:39:28 AM Scope Withdrawal Time: 0 hours 18 minutes 59 seconds  Total Procedure Duration: 0 hours 25 minutes 29 seconds  Findings:                 Five sessile polyps were found in the proximal                            transverse colon and distal ascending colon. The                            polyps were 2 to 4 mm in size. These polyps were                            removed with a cold biopsy forceps. Resection and                            retrieval were complete.                           Three sessile polyps were found in the distal  transverse colon. The polyps were 4 to 10 mm in                            size. Larger polyp was removed with a hot snare,                            and smaller polyps with cold snare. Resection and                            retrieval were complete.                           Few hyperplastic appearing polyps (confirmed by                            NBI) were present in distal sigmoid and proximal                            rectum.                           A few small-mouthed diverticula were found in the                            sigmoid colon.                           Non-bleeding internal hemorrhoids were found during                            retroflexion and during perianal exam. The                            hemorrhoids were severe and Grade IV (internal                            hemorrhoids that prolapse and cannot be reduced                            manually).                            The exam was otherwise without abnormality on                            direct and retroflexion views. Complications:            No immediate complications. Estimated Blood Loss:     Estimated blood loss: none. Impression:               - Colonic polyps s/p polypectomy.                           - Diverticulosis in the sigmoid colon.                           -  Gd IV Non-bleeding internal hemorrhoids.                           - The examination was otherwise normal on direct                            and retroflexion views. Recommendation:           - Patient has a contact number available for                            emergencies. The signs and symptoms of potential                            delayed complications were discussed with the                            patient. Return to normal activities tomorrow.                            Written discharge instructions were provided to the                            patient.                           - High fiber diet.                           - Continue present medications.                           - Await pathology results.                           - Repeat colonoscopy for surveillance based on                            pathology results.                           - ProctoFoam-HC: Apply externally BID for 10 days.                           - Take Sitz baths BID for seven days.                           - If still with problems with Hoids, recommend                            surgical eval. Jackquline Denmark, MD 07/14/2020 11:54:31 AM This report has been signed electronically.

## 2020-07-16 ENCOUNTER — Telehealth: Payer: Self-pay

## 2020-07-16 NOTE — Telephone Encounter (Signed)
Covid-19 screening questions   Do you now or have you had a fever in the last 14 days? No.  Do you have any respiratory symptoms of shortness of breath or cough now or in the last 14 days? No.  Do you have any family members or close contacts with diagnosed or suspected Covid-19 in the past 14 days? No.  Have you been tested for Covid-19 and found to be positive? No.       Follow up Call-  Call back number 07/14/2020  Post procedure Call Back phone  # [phone]  Permission to leave phone message Yes  Some recent data might be hidden     Patient questions:  Do you have a fever, pain , or abdominal swelling? No. Pain Score  0 *  Have you tolerated food without any problems? Yes.    Have you been able to return to your normal activities? Yes.    Do you have any questions about your discharge instructions: Diet   No. Medications  No. Follow up visit  No.  Do you have questions or concerns about your Care? No.  Actions: * If pain score is 4 or above: No action needed, pain <4.

## 2020-07-29 MED FILL — AMLODIPINE BESYLATE 10 MG T: 10 | 30 days supply | Qty: 30 | Fill #1

## 2020-07-29 MED FILL — HYDROCHLOROTHIAZIDE 12.5 MG: 12.5 | 30 days supply | Qty: 30 | Fill #1

## 2020-08-08 ENCOUNTER — Encounter: Payer: Self-pay | Admitting: Gastroenterology

## 2020-08-31 MED FILL — HYDROCHLOROTHIAZIDE 12.5 MG: 12.5 | 30 days supply | Qty: 30 | Fill #2

## 2020-08-31 MED FILL — AMLODIPINE BESYLATE 10 MG T: 10 | 30 days supply | Qty: 30 | Fill #2

## 2020-09-14 ENCOUNTER — Encounter: Payer: Self-pay | Admitting: Internal Medicine

## 2020-09-14 ENCOUNTER — Ambulatory Visit: Payer: 59 | Attending: Internal Medicine | Admitting: Internal Medicine

## 2020-09-14 ENCOUNTER — Other Ambulatory Visit: Payer: Self-pay

## 2020-09-14 ENCOUNTER — Other Ambulatory Visit: Payer: Self-pay | Admitting: Internal Medicine

## 2020-09-14 ENCOUNTER — Ambulatory Visit (HOSPITAL_BASED_OUTPATIENT_CLINIC_OR_DEPARTMENT_OTHER): Payer: 59 | Admitting: Pharmacist

## 2020-09-14 VITALS — BP 126/81 | HR 63 | Resp 16 | Wt 251.4 lb

## 2020-09-14 DIAGNOSIS — I700 Atherosclerosis of aorta: Secondary | ICD-10-CM

## 2020-09-14 DIAGNOSIS — F17200 Nicotine dependence, unspecified, uncomplicated: Secondary | ICD-10-CM | POA: Diagnosis not present

## 2020-09-14 DIAGNOSIS — Z23 Encounter for immunization: Secondary | ICD-10-CM

## 2020-09-14 DIAGNOSIS — E669 Obesity, unspecified: Secondary | ICD-10-CM

## 2020-09-14 DIAGNOSIS — E782 Mixed hyperlipidemia: Secondary | ICD-10-CM | POA: Diagnosis not present

## 2020-09-14 DIAGNOSIS — I10 Essential (primary) hypertension: Secondary | ICD-10-CM | POA: Diagnosis not present

## 2020-09-14 DIAGNOSIS — J849 Interstitial pulmonary disease, unspecified: Secondary | ICD-10-CM

## 2020-09-14 DIAGNOSIS — D126 Benign neoplasm of colon, unspecified: Secondary | ICD-10-CM

## 2020-09-14 DIAGNOSIS — J432 Centrilobular emphysema: Secondary | ICD-10-CM

## 2020-09-14 MED ORDER — AMLODIPINE BESYLATE 10 MG PO TABS: 10.0000 mg | ORAL_TABLET | Freq: Every day | ORAL | 1 refills | Status: DC

## 2020-09-14 MED ORDER — HYDROCHLOROTHIAZIDE 12.5 MG PO TABS: 12.5000 mg | ORAL_TABLET | Freq: Every day | ORAL | 1 refills | Status: DC

## 2020-09-14 MED ORDER — ATORVASTATIN CALCIUM 10 MG PO TABS: 10.0000 mg | ORAL_TABLET | Freq: Every day | ORAL | 6 refills | Status: DC

## 2020-09-14 MED ORDER — NICOTINE POLACRILEX 2 MG MT GUM: CHEWING_GUM | OROMUCOSAL | 0 refills | Status: DC

## 2020-09-14 MED ORDER — FLUTICASONE-SALMETEROL 250-50 MCG/DOSE IN AEPB: INHALATION_SPRAY | RESPIRATORY_TRACT | 0 refills | Status: DC

## 2020-09-14 MED ORDER — ALBUTEROL SULFATE HFA 108 (90 BASE) MCG/ACT IN AERS: 2.0000 | INHALATION_SPRAY | RESPIRATORY_TRACT | 0 refills | Status: DC | PRN

## 2020-09-14 MED FILL — ADVAIR 250/50 DISKUS: 250-50 | 30 days supply | Qty: 60 | Fill #0

## 2020-09-14 MED FILL — SM NICOTINE 2 MG CHEWING GU: 2 | 25 days supply | Qty: 50 | Fill #0

## 2020-09-14 MED FILL — VENTOLIN HFA 90 MCG INHALER: 108 (90 BAS | 16 days supply | Qty: 18 | Fill #0

## 2020-09-14 MED FILL — ATORVASTATIN 10 MG TABLET: 10 | 30 days supply | Qty: 30 | Fill #0

## 2020-09-14 NOTE — Progress Notes (Signed)
Patient ID: Krystal Allen, female    DOB: 1956/01/10  MRN: [id number]  CC: Hypertension   Subjective: Krystal Allen is a 64 y.o. female who presents for chronic ds management Her concerns today include:  Pt with hx of HTN, HL, aortic atherosclerosis, COPD, ILD, GERD, preDM, tob dep, DJD c-spine, spinal stenosis, stress incont, adenomatous colon polyp.  Colon Polyps:  Had c-scope done and had several polyps removed.  She will be due again in 3 years.  COPD/ILD/Tob dep:  Uses Advair QOD.  Reports she forgets and does not like taste or having to rinse mouth every time she uses it.   Uses ProAir PRN but not lately.  Last used neb in 04/2020 -vaping to try to get off cigarettes.  She has been able to cut back on smoking.  1 pk last 1 wk instead of 2 days.  Still using nicotine gum which is helpful to her.  Wants RF.  Received flu shot today Referred back to her pulmonologist on last visit in July.  They did try to call her twice but she did not return the call.  Patient states she does not recall receiving a message.  HTN:  Compliant with taking amlodipine and hydrochlorothiazide.  She limits salt in the foods.  Denies any chest pains or significant shortness of breath.  No swelling in the legs.  Obesity: Wants a diet pill.  When asked what she is doing to help get her weight down, patient replied "I ain't doing nothing."  Not exercising and states she is not going to.  When asked about her eating habits, patient states "I know how to eat.  I can do it if I really put my mind to it."  HL:  Misplaced Lipitor.  Aortic Atherosclerosis on CT 2/20219.  Not taking ASA  Patient Active Problem List   Diagnosis Date Noted  . Influenza vaccine needed 09/14/2020  . Tobacco dependence 05/12/2020  . Aortic atherosclerosis ([place]) 05/12/2020  . Adenomatous polyp of colon 05/12/2020  . ILD (interstitial lung disease) ([place]) 01/26/2018  . Cigarette smoker 01/26/2018  . Osteoarthritis of cervical spine  12/22/2017  . Prediabetes 12/22/2017  . Pain of left calf 04/06/2017  . Centrilobular emphysema ([place]) 02/23/2016  . Obesity (BMI 30-39.9) 01/15/2016  . Bilateral shoulder pain 01/15/2016  . Esophageal reflux 01/15/2016  . Tobacco abuse 05/13/2015  . Lung nodule 08/14/2014  . Impaired fasting glucose 08/14/2014  . Hyperlipidemia 08/14/2014  . Personal history of colonic adenomas 06/25/2013  . HTN (hypertension) 05/20/2013     Current Outpatient Medications on File Prior to Visit  Medication Sig Dispense Refill  . acetaminophen (TYLENOL 8 HOUR) 650 MG CR tablet Take 1 tablet (650 mg total) by mouth 2 (two) times daily as needed for pain. 60 tablet 5  . albuterol (PROAIR HFA) 108 (90 Base) MCG/ACT inhaler Inhale 2 puffs into the lungs every 4 (four) hours as needed for wheezing or shortness of breath. 18 g 0  . albuterol (PROVENTIL) (2.5 MG/3ML) 0.083% nebulizer solution TAKE 3 MLS BY NEBULIZATION EVERY 6 (SIX) HOURS AS NEEDED FOR WHEEZING OR SHORTNESS OF BREATH. 90 mL 1  . amLODipine (NORVASC) 10 MG tablet Take 1 tablet (10 mg total) by mouth daily. Must have office visit for refills 30 tablet 2  . aspirin EC 81 MG tablet Take 1 tablet (81 mg total) by mouth daily. 100 tablet 1  . benzonatate (TESSALON) 100 MG capsule Take 1 capsule (100 mg total) by mouth  3 (three) times daily as needed. (Patient not taking: Reported on 09/14/2020) 30 capsule 0  . busPIRone (BUSPAR) 5 MG tablet Take 1 tablet (5 mg total) by mouth 2 (two) times daily. 30 tablet 0  . cetirizine (ZYRTEC) 10 MG tablet Take 1 tablet (10 mg total) by mouth daily. 30 tablet 1  . fluticasone (FLONASE) 50 MCG/ACT nasal spray Place 2 sprays into both nostrils daily. 16 g 6  . Fluticasone-Salmeterol (ADVAIR) 250-50 MCG/DOSE AEPB 1 puff bid 60 each 0  . guaiFENesin-codeine 100-10 MG/5ML syrup Take 5 mLs by mouth 3 (three) times daily as needed for cough. 118 mL 0  . hydrochlorothiazide (HYDRODIURIL) 12.5 MG tablet Take 1 tablet (12.5  mg total) by mouth daily. 30 tablet 2  . hydrocortisone-pramoxine (PROCTOFOAM HC) rectal foam Place 1 applicator rectally 2 (two) times daily. Apply externally twice daily for 10 days. 30 g 0  . nicotine polacrilex (NICORETTE) 2 MG gum 2 1 to 2 pieces as needed to decrease cravings. Max 30 pieces/day 100 tablet 0  . omeprazole (PRILOSEC) 20 MG capsule Take 1 capsule (20 mg total) by mouth daily. 30 capsule 3  . oxybutynin (DITROPAN) 5 MG tablet Take 1 tablet (5 mg total) by mouth 2 (two) times daily. 60 tablet 3  . predniSONE (STERAPRED UNI-PAK 21 TAB) 10 MG (21) TBPK tablet Take by mouth daily. Take steroid taper as written (Patient not taking: Reported on 09/14/2020) 21 tablet 0  . Respiratory Therapy Supplies (FLUTTER) DEVI Use as directed 1 each 0   No current facility-administered medications on file prior to visit.    Allergies  Allergen Reactions  . Sulfa Antibiotics Anaphylaxis and Hives  . Chantix [Varenicline Tartrate]     Bad dreams  . Fish Allergy Other (See Comments)    Severe stomach cramps   . Penicillins Other (See Comments)    Has patient had a PCN reaction causing immediate rash, facial/tongue/throat swelling, SOB or lightheadedness with hypotension: No Has patient had a PCN reaction causing severe rash involving mucus membranes or skin necrosis: No Has patient had a PCN reaction that required hospitalization No Has patient had a PCN reaction occurring within the last 10 years: No If all of the above answers are "NO", then may proceed with Cephalosporin use.  Welts   . Latex Rash and Other (See Comments)    Dry skin    Social History   Socioeconomic History  . Marital status: Married    Spouse name: Not on file  . Number of children: Not on file  . Years of education: 12+  . Highest education level: Not on file  Occupational History  . Occupation: PCA    Employer: [employer]  Tobacco Use  . Smoking status: Current Every Day Smoker    Packs/day: 0.25     Types: E-cigarettes  . Smokeless tobacco: Never Used  . Tobacco comment: 06/30/20 has cut down on cigarettes using gum and occasional vaping  Vaping Use  . Vaping Use: Every day  Substance and Sexual Activity  . Alcohol use: Yes    Comment: occasional  . Drug use: No  . Sexual activity: Not on file  Other Topics Concern  . Not on file  Social History Narrative   Regular exercise-no   Caffeine Use-   Social Determinants of Health   Financial Resource Strain:   . Difficulty of Paying Living Expenses: Not on file  Food Insecurity:   . Worried About Charity fundraiser in the  Last Year: Not on file  . Ran Out of Food in the Last Year: Not on file  Transportation Needs:   . Lack of Transportation (Medical): Not on file  . Lack of Transportation (Non-Medical): Not on file  Physical Activity:   . Days of Exercise per Week: Not on file  . Minutes of Exercise per Session: Not on file  Stress:   . Feeling of Stress : Not on file  Social Connections:   . Frequency of Communication with Friends and Family: Not on file  . Frequency of Social Gatherings with Friends and Family: Not on file  . Attends Religious Services: Not on file  . Active Member of Clubs or Organizations: Not on file  . Attends Archivist Meetings: Not on file  . Marital Status: Not on file  Intimate Partner Violence:   . Fear of Current or Ex-Partner: Not on file  . Emotionally Abused: Not on file  . Physically Abused: Not on file  . Sexually Abused: Not on file    Family History  Problem Relation Age of Onset  . Cancer Father        prostate  . Hypertension Mother   . Diabetes Mother   . Hypertension Sister   . Heart disease Maternal Uncle   . Stroke Maternal Grandmother   . Hypertension Maternal Grandmother   . Hypertension Sister   . Colon cancer Neg Hx   . Stomach cancer Neg Hx   . Rectal cancer Neg Hx   . Colon polyps Neg Hx     Past Surgical History:  Procedure Laterality Date  .  ABDOMINAL HYSTERECTOMY  78yrs ago   due to heavy bleeding and fibroids   . COLONOSCOPY    . HEMORRHOID BANDING  2014  . HEMORRHOID SURGERY  16yrs agao  . UMBILICAL HERNIA REPAIR  06/2015    ROS: Review of Systems Negative except as stated above  PHYSICAL EXAM: BP 126/81   Pulse 63   Resp 16   Wt 251 lb 6.4 oz (114 kg)   SpO2 99%   BMI 38.23 kg/m   Wt Readings from Last 3 Encounters:  09/14/20 251 lb 6.4 oz (114 kg)  07/14/20 248 lb (112.5 kg)  06/30/20 248 lb (112.5 kg)     Physical Exam  General appearance - alert, well appearing, older African-American female and in no distress Mental status - normal mood, behavior, speech, dress, motor activity, and thought processes Nose - normal and patent, no erythema, discharge or polyps Mouth - mucous membranes moist, pharynx normal without lesions Neck - supple, no significant adenopathy Chest -breath sounds slightly decreased but no wheezes or crackles heard. Heart - normal rate, regular rhythm, normal S1, S2, no murmurs, rubs, clicks or gallops Extremities -no lower extremity edema.  CMP Latest Ref Rng & Units 02/10/2020 04/14/2019 04/13/2017  Glucose 65 - 99 mg/dL 83 89 114(H)  BUN 8 - 27 mg/dL 13 16 21(H)  Creatinine 0.57 - 1.00 mg/dL 1.07(H) 1.10(H) 1.00  Sodium 134 - 144 mmol/L 143 140 143  Potassium 3.5 - 5.2 mmol/L 4.3 4.2 4.0  Chloride 96 - 106 mmol/L 109(H) 108 108  CO2 20 - 29 mmol/L 19(L) - -  Calcium 8.7 - 10.3 mg/dL 9.0 - -  Total Protein 6.0 - 8.5 g/dL 7.4 - -  Total Bilirubin 0.0 - 1.2 mg/dL <0.2 - -  Alkaline Phos 39 - 117 IU/L 135(H) - -  AST 0 - 40 IU/L 12 - -  ALT 0 - 32 IU/L 11 - -   Lipid Panel     Component Value Date/Time   CHOL 213 (H) 04/06/2017 1152   TRIG 91 04/06/2017 1152   HDL 46 04/06/2017 1152   CHOLHDL 4.6 (H) 04/06/2017 1152   CHOLHDL 5.2 (H) 01/15/2016 1000   VLDL 26 01/15/2016 1000   LDLCALC 149 (H) 04/06/2017 1152   LDLDIRECT 190.1 04/16/2013 1531    CBC    Component Value  Date/Time   WBC 6.0 02/10/2020 1000   WBC 6.9 04/13/2017 1437   RBC 4.88 02/10/2020 1000   RBC 4.67 04/13/2017 1437   HGB 14.4 02/10/2020 1000   HCT 43.2 02/10/2020 1000   PLT 236 04/13/2017 1437   MCV 89 02/10/2020 1000   MCH 29.5 02/10/2020 1000   MCH 30.2 04/13/2017 1437   MCHC 33.3 02/10/2020 1000   MCHC 34.0 04/13/2017 1437   RDW 16.4 (H) 02/10/2020 1000   LYMPHSABS 2.2 02/10/2020 1000   MONOABS 0.4 04/13/2017 1437   EOSABS 0.2 02/10/2020 1000   BASOSABS 0.1 02/10/2020 1000    ASSESSMENT AND PLAN: 1. Essential hypertension Close to goal.  Continue amlodipine and hydrochlorothiazide.  2. Mixed hyperlipidemia 3. Aortic atherosclerosis ([place]) Patient encouraged to get back on atorvastatin to help decrease acute cardiovascular events.  Also recommend that she take a baby aspirin daily. - atorvastatin (LIPITOR) 10 MG tablet; Take 1 tablet (10 mg total) by mouth daily.  Dispense: 30 tablet; Refill: 6 - Lipid panel - Hepatic Function Panel  4. Tobacco dependence Commended her on cutting back.  Encouraged her to continue to work towards quitting.  Advised to discontinue vaping as it can cause acute lung disease.  Refill given on nicotine gum.  Less than 5 minutes spent on counseling. - nicotine polacrilex (NICORETTE) 2 MG gum; 2 1 to 2 pieces as needed to decrease cravings. Max 30 pieces/day  Dispense: 100 tablet; Refill: 0  5. Obesity (BMI 35.0-39.9 without comorbidity) Discussed the importance of healthy eating habits and regular exercise to help bring about weight loss.  Advised that diet pills are usually not prescribed without a commitment to changing eating habits and trying to exercise regularly.  She declines referral to nutritionist.  She states that she will be able to do it on her own if she really wanted to.  6. Adenomatous polyp of colon, unspecified part of colon  7.  COPD 8.  Interstitial lung disease She seems to be well controlled even though she is using the  Advair every other day.  Advised that if we switch to a different combination inhaler it would still be recommended that she rinse her mouth out after each use. - Fluticasone-Salmeterol (ADVAIR) 250-50 MCG/DOSE AEPB; 1 puff bid  Dispense: 60 each; Refill: 0 - albuterol (PROAIR HFA) 108 (90 Base) MCG/ACT inhaler; Inhale 2 puffs into the lungs every 4 (four) hours as needed for wheezing or shortness of breath.  Dispense: 18 g; Refill: 0  9. Influenza vaccine needed Given today.     Patient was given the opportunity to ask questions.  Patient verbalized understanding of the plan and was able to repeat key elements of the plan.   No orders of the defined types were placed in this encounter.    Requested Prescriptions    No prescriptions requested or ordered in this encounter    No follow-ups on file.  Karle Plumber, MD, FACP

## 2020-09-14 NOTE — Patient Instructions (Addendum)
Healthy Eating Following a healthy eating pattern may help you to achieve and maintain a healthy body weight, reduce the risk of chronic disease, and live a long and productive life. It is important to follow a healthy eating pattern at an appropriate calorie level for your body. Your nutritional needs should be met primarily through food by choosing a variety of nutrient-rich foods. What are tips for following this plan? Reading food labels  Read labels and choose the following: ? Reduced or low sodium. ? Juices with 100% fruit juice. ? Foods with low saturated fats and high polyunsaturated and monounsaturated fats. ? Foods with whole grains, such as whole wheat, cracked wheat, brown rice, and wild rice. ? Whole grains that are fortified with folic acid. This is recommended for women who are pregnant or who want to become pregnant.  Read labels and avoid the following: ? Foods with a lot of added sugars. These include foods that contain brown sugar, corn sweetener, corn syrup, dextrose, fructose, glucose, high-fructose corn syrup, honey, invert sugar, lactose, malt syrup, maltose, molasses, raw sugar, sucrose, trehalose, or turbinado sugar.  Do not eat more than the following amounts of added sugar per day:  6 teaspoons (25 g) for women.  9 teaspoons (38 g) for men. ? Foods that contain processed or refined starches and grains. ? Refined grain products, such as white flour, degermed cornmeal, white bread, and white rice. Shopping  Choose nutrient-rich snacks, such as vegetables, whole fruits, and nuts. Avoid high-calorie and high-sugar snacks, such as potato chips, fruit snacks, and candy.  Use oil-based dressings and spreads on foods instead of solid fats such as butter, stick margarine, or cream cheese.  Limit pre-made sauces, mixes, and "instant" products such as flavored rice, instant noodles, and ready-made pasta.  Try more plant-protein sources, such as tofu, tempeh, black beans,  edamame, lentils, nuts, and seeds.  Explore eating plans such as the Mediterranean diet or vegetarian diet. Cooking  Use oil to saut or stir-fry foods instead of solid fats such as butter, stick margarine, or lard.  Try baking, boiling, grilling, or broiling instead of frying.  Remove the fatty part of meats before cooking.  Steam vegetables in water or broth. Meal planning   At meals, imagine dividing your plate into fourths: ? One-half of your plate is fruits and vegetables. ? One-fourth of your plate is whole grains. ? One-fourth of your plate is protein, especially lean meats, poultry, eggs, tofu, beans, or nuts.  Include low-fat dairy as part of your daily diet. Lifestyle  Choose healthy options in all settings, including home, work, school, restaurants, or stores.  Prepare your food safely: ? Wash your hands after handling raw meats. ? Keep food preparation surfaces clean by regularly washing with hot, soapy water. ? Keep raw meats separate from ready-to-eat foods, such as fruits and vegetables. ? Cook seafood, meat, poultry, and eggs to the recommended internal temperature. ? Store foods at safe temperatures. In general:  Keep cold foods at 59F (4.4C) or below.  Keep hot foods at 159F (60C) or above.  Keep your freezer at [facility] (-17.8C) or below.  Foods are no longer safe to eat when they have been between the temperatures of 40-159F (4.4-60C) for more than 2 hours. What foods should I eat? Fruits Aim to eat 2 cup-equivalents of fresh, canned (in natural juice), or frozen fruits each day. Examples of 1 cup-equivalent of fruit include 1 small apple, 8 large strawberries, 1 cup canned fruit,  cup  dried fruit, or 1 cup 100% juice. Vegetables Aim to eat 2-3 cup-equivalents of fresh and frozen vegetables each day, including different varieties and colors. Examples of 1 cup-equivalent of vegetables include 2 medium carrots, 2 cups raw, leafy greens, 1 cup chopped  vegetable (raw or cooked), or 1 medium baked potato. Grains Aim to eat 6 ounce-equivalents of whole grains each day. Examples of 1 ounce-equivalent of grains include 1 slice of bread, 1 cup ready-to-eat cereal, 3 cups popcorn, or  cup cooked rice, pasta, or cereal. Meats and other proteins Aim to eat 5-6 ounce-equivalents of protein each day. Examples of 1 ounce-equivalent of protein include 1 egg, 1/2 cup nuts or seeds, or 1 tablespoon (16 g) peanut butter. A cut of meat or fish that is the size of a deck of cards is about 3-4 ounce-equivalents.  Of the protein you eat each week, try to have at least 8 ounces come from seafood. This includes salmon, trout, herring, and anchovies. Dairy Aim to eat 3 cup-equivalents of fat-free or low-fat dairy each day. Examples of 1 cup-equivalent of dairy include 1 cup (240 mL) milk, 8 ounces (250 g) yogurt, 1 ounces (44 g) natural cheese, or 1 cup (240 mL) fortified soy milk. Fats and oils  Aim for about 5 teaspoons (21 g) per day. Choose monounsaturated fats, such as canola and olive oils, avocados, peanut butter, and most nuts, or polyunsaturated fats, such as sunflower, corn, and soybean oils, walnuts, pine nuts, sesame seeds, sunflower seeds, and flaxseed. Beverages  Aim for six 8-oz glasses of water per day. Limit coffee to three to five 8-oz cups per day.  Limit caffeinated beverages that have added calories, such as soda and energy drinks.  Limit alcohol intake to no more than 1 drink a day for nonpregnant women and 2 drinks a day for men. One drink equals 12 oz of beer (355 mL), 5 oz of wine (148 mL), or 1 oz of hard liquor (44 mL). Seasoning and other foods  Avoid adding excess amounts of salt to your foods. Try flavoring foods with herbs and spices instead of salt.  Avoid adding sugar to foods.  Try using oil-based dressings, sauces, and spreads instead of solid fats. This information is based on general [place] nutrition guidelines. For more  information, visit choosemyplate.gov. Exact amounts may vary based on your nutrition needs. Summary  A healthy eating plan may help you to maintain a healthy weight, reduce the risk of chronic diseases, and stay active throughout your life.  Plan your meals. Make sure you eat the right portions of a variety of nutrient-rich foods.  Try baking, boiling, grilling, or broiling instead of frying.  Choose healthy options in all settings, including home, work, school, restaurants, or stores. This information is not intended to replace advice given to you by your health care provider. Make sure you discuss any questions you have with your health care provider. Document Revised: 01/29/2018 Document Reviewed: 01/29/2018 Elsevier Patient Education  2020 Elsevier Inc.   Influenza Virus Vaccine injection (Fluarix) What is this medicine? INFLUENZA VIRUS VACCINE (in floo EN zuh VAHY ruhs vak SEEN) helps to reduce the risk of getting influenza also known as the flu. This medicine may be used for other purposes; ask your health care provider or pharmacist if you have questions. COMMON BRAND NAME(S): Fluarix, Fluzone What should I tell my health care provider before I take this medicine? They need to know if you have any of these conditions:  bleeding disorder   like hemophilia  fever or infection  Guillain-Barre syndrome or other neurological problems  immune system problems  infection with the human immunodeficiency virus (HIV) or AIDS  low blood platelet counts  multiple sclerosis  an unusual or allergic reaction to influenza virus vaccine, eggs, chicken proteins, latex, gentamicin, other medicines, foods, dyes or preservatives  pregnant or trying to get pregnant  breast-feeding How should I use this medicine? This vaccine is for injection into a muscle. It is given by a health care professional. A copy of Vaccine Information Statements will be given before each vaccination. Read this  sheet carefully each time. The sheet may change frequently. Talk to your pediatrician regarding the use of this medicine in children. Special care may be needed. Overdosage: If you think you have taken too much of this medicine contact a poison control center or emergency room at once. NOTE: This medicine is only for you. Do not share this medicine with others. What if I miss a dose? This does not apply. What may interact with this medicine?  chemotherapy or radiation therapy  medicines that lower your immune system like etanercept, anakinra, infliximab, and adalimumab  medicines that treat or prevent blood clots like warfarin  phenytoin  steroid medicines like prednisone or cortisone  theophylline  vaccines This list may not describe all possible interactions. Give your health care provider a list of all the medicines, herbs, non-prescription drugs, or dietary supplements you use. Also tell them if you smoke, drink alcohol, or use illegal drugs. Some items may interact with your medicine. What should I watch for while using this medicine? Report any side effects that do not go away within 3 days to your doctor or health care professional. Call your health care provider if any unusual symptoms occur within 6 weeks of receiving this vaccine. You may still catch the flu, but the illness is not usually as bad. You cannot get the flu from the vaccine. The vaccine will not protect against colds or other illnesses that may cause fever. The vaccine is needed every year. What side effects may I notice from receiving this medicine? Side effects that you should report to your doctor or health care professional as soon as possible:  allergic reactions like skin rash, itching or hives, swelling of the face, lips, or tongue Side effects that usually do not require medical attention (report to your doctor or health care professional if they continue or are bothersome):  fever  headache  muscle  aches and pains  pain, tenderness, redness, or swelling at site where injected  weak or tired This list may not describe all possible side effects. Call your doctor for medical advice about side effects. You may report side effects to FDA at 1-800-FDA-1088. Where should I keep my medicine? This vaccine is only given in a clinic, pharmacy, doctor's office, or other health care setting and will not be stored at home. NOTE: This sheet is a summary. It may not cover all possible information. If you have questions about this medicine, talk to your doctor, pharmacist, or health care provider.  2020 Elsevier/Gold Standard (2008-05-14 09:30:40)  

## 2020-09-14 NOTE — Progress Notes (Signed)
Patient presents for vaccination against influenza per orders of Dr. Johnson. Consent given. Counseling provided. No contraindications exists. Vaccine administered without incident.  ° °Luke Van Ausdall, PharmD, CPP °Clinical Pharmacist °[facility] °[phone] ° °

## 2020-09-15 LAB — LIPID PANEL
Chol/HDL Ratio: 6.4 ratio — ABNORMAL HIGH (ref 0.0–4.4)
Cholesterol, Total: 251 mg/dL — ABNORMAL HIGH (ref 100–199)
HDL: 39 mg/dL — ABNORMAL LOW (ref >39)
LDL Chol Calc (NIH): 193 mg/dL — ABNORMAL HIGH (ref 0–99)
Triglycerides: 108 mg/dL (ref 0–149)
VLDL Cholesterol Cal: 19 mg/dL (ref 5–40)

## 2020-09-15 LAB — HEPATIC FUNCTION PANEL
ALT: 13 IU/L (ref 0–32)
AST: 12 IU/L (ref 0–40)
Albumin: 3.9 g/dL (ref 3.8–4.8)
Alkaline Phosphatase: 111 IU/L (ref 44–121)
Bilirubin Total: <0.2 mg/dL (ref 0.0–1.2)
Bilirubin, Direct: <0.1 mg/dL (ref 0.00–0.40)
Total Protein: 7.1 g/dL (ref 6.0–8.5)

## 2020-09-28 MED FILL — HYDROCHLOROTHIAZIDE 12.5 MG: 12.5 | 90 days supply | Qty: 90 | Fill #0

## 2020-09-28 MED FILL — AMLODIPINE BESYLATE 10 MG T: 10 | 90 days supply | Qty: 90 | Fill #0

## 2020-10-16 MED FILL — ATORVASTATIN 10 MG TABLET: 10 | 30 days supply | Qty: 30 | Fill #1

## 2020-11-23 MED FILL — ATORVASTATIN 10 MG TABLET: 10 | 30 days supply | Qty: 30 | Fill #2

## 2020-12-29 MED FILL — AMLODIPINE BESYLATE 10 MG T: 10 | 30 days supply | Qty: 30 | Fill #1

## 2020-12-29 MED FILL — ATORVASTATIN 10 MG TABLET: 10 | 30 days supply | Qty: 30 | Fill #3

## 2020-12-29 MED FILL — HYDROCHLOROTHIAZIDE 12.5 MG: 12.5 | 30 days supply | Qty: 30 | Fill #1

## 2021-01-12 ENCOUNTER — Telehealth: Payer: 59 | Admitting: Internal Medicine

## 2021-01-22 ENCOUNTER — Encounter: Payer: Self-pay | Admitting: Internal Medicine

## 2021-01-22 ENCOUNTER — Other Ambulatory Visit: Payer: Self-pay | Admitting: Internal Medicine

## 2021-01-22 ENCOUNTER — Other Ambulatory Visit: Payer: Self-pay

## 2021-01-22 ENCOUNTER — Ambulatory Visit: Payer: Medicare Other | Attending: Internal Medicine | Admitting: Internal Medicine

## 2021-01-22 DIAGNOSIS — F17210 Nicotine dependence, cigarettes, uncomplicated: Secondary | ICD-10-CM | POA: Diagnosis not present

## 2021-01-22 DIAGNOSIS — E669 Obesity, unspecified: Secondary | ICD-10-CM

## 2021-01-22 DIAGNOSIS — I10 Essential (primary) hypertension: Secondary | ICD-10-CM

## 2021-01-22 DIAGNOSIS — E782 Mixed hyperlipidemia: Secondary | ICD-10-CM | POA: Diagnosis not present

## 2021-01-22 DIAGNOSIS — J432 Centrilobular emphysema: Secondary | ICD-10-CM | POA: Diagnosis not present

## 2021-01-22 DIAGNOSIS — F17200 Nicotine dependence, unspecified, uncomplicated: Secondary | ICD-10-CM

## 2021-01-22 MED ORDER — AMLODIPINE BESYLATE 10 MG PO TABS: 10.0000 mg | ORAL_TABLET | Freq: Every day | ORAL | 1 refills | Status: DC

## 2021-01-22 MED ORDER — NICOTINE POLACRILEX 2 MG MT GUM: CHEWING_GUM | OROMUCOSAL | 4 refills | Status: DC

## 2021-01-22 MED ORDER — ATORVASTATIN CALCIUM 10 MG PO TABS: 10.0000 mg | ORAL_TABLET | Freq: Every day | ORAL | 1 refills | Status: DC

## 2021-01-22 MED ORDER — FLUTICASONE-SALMETEROL 250-50 MCG/DOSE IN AEPB: INHALATION_SPRAY | RESPIRATORY_TRACT | 6 refills | Status: DC

## 2021-01-22 MED ORDER — HYDROCHLOROTHIAZIDE 12.5 MG PO TABS: 12.5000 mg | ORAL_TABLET | Freq: Every day | ORAL | 1 refills | Status: DC

## 2021-01-22 MED FILL — ATORVASTATIN 10 MG TABLET: 10 | 30 days supply | Qty: 30 | Fill #0

## 2021-01-22 MED FILL — AMLODIPINE BESYLATE 10 MG T: 10 | 30 days supply | Qty: 30 | Fill #0

## 2021-01-22 MED FILL — HYDROCHLOROTHIAZIDE 12.5 MG: 12.5 | 30 days supply | Qty: 30 | Fill #0

## 2021-01-22 MED FILL — !ADVAIR 250/50 DISKUS: 250-50 | 30 days supply | Qty: 60 | Fill #0

## 2021-01-22 NOTE — Progress Notes (Signed)
Virtual Visit via Telephone Note  I connected with Krystal Allen on 01/22/21 at 9:33 a.m by telephone and verified that I am speaking with the correct person using two identifiers.  Location: Patient: home Provider: office Participants: Myself Patient CMA:  Krystal Allen  I discussed the limitations, risks, security and privacy concerns of performing an evaluation and management service by telephone and the availability of in person appointments. I also discussed with the patient that there may be a patient responsible charge related to this service. The patient expressed understanding and agreed to proceed.   History of Present Illness: Pt with hx of HTN, HL, aortic atherosclerosis, COPD,ILD, GERD, preDM,tob dep, DJD c-spine, spinal stenosis, stress incont, adenomatous colon polyp.  Last visit 08/2020.  Purpose of today's visit is chronic disease management.  HL:  Did fill rxn for Lipitor.  Reports she is taking and tolerating. Her LDL was 193 before starting the med.  HYPERTENSION Currently taking: see medication list.  She is on amlodipine and hydrochlorothiazide. Med Adherence: [x]  Yes    []  No Medication side effects: []  Yes    [x]  No Adherence with salt restriction: [x]  Yes    []  No Home Monitoring?: []  Yes    [x]  No Monitoring Frequency: []  Yes    []  No Home BP results range: []  Yes    []  No SOB? []  Yes    [x]  No Chest Pain?: []  Yes    [x]  No Leg swelling?: []  Yes    [x]  No Headaches?: []  Yes    [x]  No Dizziness? []  Yes    [x]  No Comments:   COPD/ILD: Reports no major flareup since last visit.  Reports compliance with Advair inhaler.   Tob dep: still working on quitting. Was at 1 pk/Q1.5 days.  Down to 2 cigarettes/day.  Ran out of nicotine gum.  Found it helpful.  Request RF.  She has not set quit date as yet  Obesity:  Not gain or loss since last visit.  Walking more in her back yard. Feels she does well with eating habits.  Drinks coffee and water.  Does not snack  much between meals.  For meats she eats chicken, pork, beef.  HM: Due for COVID booster.  She plans to schedule this at [facility].  Due for bone density study.  Discussed this with the patient today.  She prefers to hold off on getting this done.  May consider on next visit.  Outpatient Encounter Medications as of 01/22/2021  Medication Sig  . acetaminophen (TYLENOL 8 HOUR) 650 MG CR tablet Take 1 tablet (650 mg total) by mouth 2 (two) times daily as needed for pain.  Marland Kitchen albuterol (PROAIR HFA) 108 (90 Base) MCG/ACT inhaler Inhale 2 puffs into the lungs every 4 (four) hours as needed for wheezing or shortness of breath.  Marland Kitchen albuterol (PROVENTIL) (2.5 MG/3ML) 0.083% nebulizer solution TAKE 3 MLS BY NEBULIZATION EVERY 6 (SIX) HOURS AS NEEDED FOR WHEEZING OR SHORTNESS OF BREATH.  Marland Kitchen amLODipine (NORVASC) 10 MG tablet Take 1 tablet (10 mg total) by mouth daily. Must have office visit for refills  . aspirin EC 81 MG tablet Take 1 tablet (81 mg total) by mouth daily.  Marland Kitchen atorvastatin (LIPITOR) 10 MG tablet Take 1 tablet (10 mg total) by mouth daily.  . busPIRone (BUSPAR) 5 MG tablet Take 1 tablet (5 mg total) by mouth 2 (two) times daily.  . cetirizine (ZYRTEC) 10 MG tablet Take 1 tablet (10 mg total) by mouth daily.  Marland Kitchen  fluticasone (FLONASE) 50 MCG/ACT nasal spray Place 2 sprays into both nostrils daily.  . Fluticasone-Salmeterol (ADVAIR) 250-50 MCG/DOSE AEPB 1 puff bid  . guaiFENesin-codeine 100-10 MG/5ML syrup Take 5 mLs by mouth 3 (three) times daily as needed for cough.  . hydrochlorothiazide (HYDRODIURIL) 12.5 MG tablet Take 1 tablet (12.5 mg total) by mouth daily.  . hydrocortisone-pramoxine (PROCTOFOAM HC) rectal foam Place 1 applicator rectally 2 (two) times daily. Apply externally twice daily for 10 days.  . nicotine polacrilex (NICORETTE) 2 MG gum 2 1 to 2 pieces as needed to decrease cravings. Max 30 pieces/day  . omeprazole (PRILOSEC) 20 MG capsule Take 1 capsule (20 mg total) by mouth daily.  Marland Kitchen  oxybutynin (DITROPAN) 5 MG tablet Take 1 tablet (5 mg total) by mouth 2 (two) times daily.  Marland Kitchen Respiratory Therapy Supplies (FLUTTER) DEVI Use as directed   No facility-administered encounter medications on file as of 01/22/2021.    Observations/Objective: Results for orders placed or performed in visit on 09/14/20  Lipid panel  Result Value Ref Range   Cholesterol, Total 251 (H) 100 - 199 mg/dL   Triglycerides 108 0 - 149 mg/dL   HDL 39 (L) >39 mg/dL   VLDL Cholesterol Cal 19 5 - 40 mg/dL   LDL Chol Calc (NIH) 193 (H) 0 - 99 mg/dL   Chol/HDL Ratio 6.4 (H) 0.0 - 4.4 ratio  Hepatic Function Panel  Result Value Ref Range   Total Protein 7.1 6.0 - 8.5 g/dL   Albumin 3.9 3.8 - 4.8 g/dL   Bilirubin Total <0.2 0.0 - 1.2 mg/dL   Bilirubin, Direct <0.10 0.00 - 0.40 mg/dL   Alkaline Phosphatase 111 44 - 121 IU/L   AST 12 0 - 40 IU/L   ALT 13 0 - 32 IU/L     Assessment and Plan: 1. Essential hypertension Continue current medications and low-salt diet. - CBC; Future - Comprehensive metabolic panel; Future - amLODipine (NORVASC) 10 MG tablet; Take 1 tablet (10 mg total) by mouth daily. Must have office visit for refills  Dispense: 90 tablet; Refill: 1 - hydrochlorothiazide (HYDRODIURIL) 12.5 MG tablet; Take 1 tablet (12.5 mg total) by mouth daily.  Dispense: 90 tablet; Refill: 1  2. Mixed hyperlipidemia Continue atorvastatin.  She will come to the lab for repeat lipid profile to see if cholesterol levels have improved on current dose of atorvastatin. - Lipid panel; Future - atorvastatin (LIPITOR) 10 MG tablet; Take 1 tablet (10 mg total) by mouth daily.  Dispense: 90 tablet; Refill: 1  3. Centrilobular emphysema ([place]) Controlled.  Continue Advair.  Advised to quit smoking. - Fluticasone-Salmeterol (ADVAIR) 250-50 MCG/DOSE AEPB; 1 puff bid  Dispense: 60 each; Refill: 6  4. Tobacco dependence Pt is current smoker. Patient advised to quit smoking. Discussed health risks associated with  smoking including lung and other types of cancers, chronic lung diseases and CV risks.  For her, she can have worsening of emphysema with continued nicotine use Pt ready to give trail of quitting and is currently working on quitting.  She has cut down significantly..   Pt wanting to continue using the nicotine gum and requests refill.  Refill sent.  Encourage her to set a quit date given that she is now down to just 2 cigarettes a day.  She states she will do so. _3_ Minutes spent on counseling. F/U: We will readdress on her next visit with [place] in 4 months.  Hopefully at that time she would have quit completely.  - nicotine  polacrilex (NICORETTE) 2 MG gum; 2 1 to 2 pieces as needed to decrease cravings. Max 30 pieces/day  Dispense: 100 tablet; Refill: 4  5. Obesity (BMI 35.0-39.9 without comorbidity) Commended her on changes in eating habits.  Further dietary counseling given.  Advised to avoid sugary drinks, cut back on portion sizes especially of white carbohydrates, try to eat more lean white meat instead of red meat or pork and incorporate fresh fruits and vegetables into the diet.  Encouraged her to try to get in some form of moderate intensity exercise as tolerated at least 3 days a week for 30 minutes.   Follow Up Instructions: 4 mths   I discussed the assessment and treatment plan with the patient. The patient was provided an opportunity to ask questions and all were answered. The patient agreed with the plan and demonstrated an understanding of the instructions.   The patient was advised to call back or seek an in-person evaluation if the symptoms worsen or if the condition fails to improve as anticipated.  I spent 14 minutes on this telephone encounter.   Karle Plumber, MD

## 2021-01-25 DIAGNOSIS — Z23 Encounter for immunization: Secondary | ICD-10-CM | POA: Diagnosis not present

## 2021-01-27 ENCOUNTER — Other Ambulatory Visit: Payer: Self-pay

## 2021-01-27 ENCOUNTER — Ambulatory Visit: Payer: Medicare Other | Attending: Internal Medicine

## 2021-01-27 DIAGNOSIS — E782 Mixed hyperlipidemia: Secondary | ICD-10-CM | POA: Diagnosis not present

## 2021-01-27 DIAGNOSIS — I10 Essential (primary) hypertension: Secondary | ICD-10-CM

## 2021-01-28 ENCOUNTER — Other Ambulatory Visit: Payer: Self-pay | Admitting: Internal Medicine

## 2021-01-28 DIAGNOSIS — E782 Mixed hyperlipidemia: Secondary | ICD-10-CM

## 2021-01-28 LAB — CBC
Hematocrit: 42.5 % (ref 34.0–46.6)
Hemoglobin: 14.1 g/dL (ref 11.1–15.9)
MCH: 29.4 pg (ref 26.6–33.0)
MCHC: 33.2 g/dL (ref 31.5–35.7)
MCV: 89 fL (ref 79–97)
Platelets: 261 10*3/uL (ref 150–450)
RBC: 4.8 x10E6/uL (ref 3.77–5.28)
RDW: 15.4 % (ref 11.7–15.4)
WBC: 6.5 10*3/uL (ref 3.4–10.8)

## 2021-01-28 LAB — COMPREHENSIVE METABOLIC PANEL
ALT: 12 IU/L (ref 0–32)
AST: 16 IU/L (ref 0–40)
Albumin/Globulin Ratio: 1.1 — ABNORMAL LOW (ref 1.2–2.2)
Albumin: 3.8 g/dL (ref 3.8–4.8)
Alkaline Phosphatase: 127 IU/L — ABNORMAL HIGH (ref 44–121)
BUN/Creatinine Ratio: 11 — ABNORMAL LOW (ref 12–28)
BUN: 16 mg/dL (ref 8–27)
Bilirubin Total: 0.3 mg/dL (ref 0.0–1.2)
CO2: 22 mmol/L (ref 20–29)
Calcium: 9.2 mg/dL (ref 8.7–10.3)
Chloride: 104 mmol/L (ref 96–106)
Creatinine, Ser: 1.44 mg/dL — ABNORMAL HIGH (ref 0.57–1.00)
Globulin, Total: 3.5 g/dL (ref 1.5–4.5)
Glucose: 99 mg/dL (ref 65–99)
Potassium: 3.7 mmol/L (ref 3.5–5.2)
Sodium: 142 mmol/L (ref 134–144)
Total Protein: 7.3 g/dL (ref 6.0–8.5)
eGFR: 40 mL/min/{1.73_m2} — ABNORMAL LOW (ref >59)

## 2021-01-28 LAB — LIPID PANEL
Chol/HDL Ratio: 4.7 ratio — ABNORMAL HIGH (ref 0.0–4.4)
Cholesterol, Total: 177 mg/dL (ref 100–199)
HDL: 38 mg/dL — ABNORMAL LOW (ref >39)
LDL Chol Calc (NIH): 124 mg/dL — ABNORMAL HIGH (ref 0–99)
Triglycerides: 80 mg/dL (ref 0–149)
VLDL Cholesterol Cal: 15 mg/dL (ref 5–40)

## 2021-01-28 MED ORDER — ATORVASTATIN CALCIUM 20 MG PO TABS: 20.0000 mg | ORAL_TABLET | Freq: Every day | ORAL | 1 refills | Status: DC

## 2021-01-28 MED FILL — ATORVASTATIN CALCIUM 20 MG: 20 | 90 days supply | Qty: 90 | Fill #0

## 2021-01-29 ENCOUNTER — Encounter (INDEPENDENT_AMBULATORY_CARE_PROVIDER_SITE_OTHER): Payer: Self-pay

## 2021-03-04 ENCOUNTER — Other Ambulatory Visit: Payer: Self-pay

## 2021-03-04 MED FILL — Hydrochlorothiazide Cap 12.5 MG: ORAL | 90 days supply | Qty: 90 | Fill #0 | Status: AC

## 2021-03-04 MED FILL — Amlodipine Besylate Tab 10 MG (Base Equivalent): ORAL | 90 days supply | Qty: 90 | Fill #0 | Status: AC

## 2021-03-16 ENCOUNTER — Other Ambulatory Visit: Payer: Self-pay | Admitting: Family

## 2021-03-16 DIAGNOSIS — Z1231 Encounter for screening mammogram for malignant neoplasm of breast: Secondary | ICD-10-CM

## 2021-03-17 ENCOUNTER — Other Ambulatory Visit: Payer: Self-pay

## 2021-03-17 ENCOUNTER — Encounter: Payer: Self-pay | Admitting: Internal Medicine

## 2021-03-17 ENCOUNTER — Ambulatory Visit
Admission: RE | Admit: 2021-03-17 | Discharge: 2021-03-17 | Disposition: A | Discharge status: Home / Self Care | Payer: Medicare Other | Source: Ambulatory Visit | Attending: Family | Admitting: Family

## 2021-03-17 DIAGNOSIS — Z1231 Encounter for screening mammogram for malignant neoplasm of breast: Secondary | ICD-10-CM

## 2021-03-17 NOTE — Progress Notes (Signed)
Let pt know that her MMG is good.  Will need repeat in 1-2 years. Also let her know that her sent her lab results from 12/2020 to her Mychart account. Please look at results and my explanation of results.  Looks like she has not reviewed them as yet.  If unable to access her account, please go over results with her.

## 2021-05-08 DIAGNOSIS — Z20822 Contact with and (suspected) exposure to covid-19: Secondary | ICD-10-CM | POA: Diagnosis not present

## 2021-05-21 ENCOUNTER — Ambulatory Visit: Payer: Self-pay | Admitting: *Deleted

## 2021-05-21 ENCOUNTER — Telehealth: Payer: Self-pay

## 2021-05-21 ENCOUNTER — Other Ambulatory Visit: Payer: Self-pay | Admitting: Internal Medicine

## 2021-05-21 ENCOUNTER — Other Ambulatory Visit: Payer: Self-pay | Admitting: Pharmacist

## 2021-05-21 ENCOUNTER — Other Ambulatory Visit: Payer: Self-pay

## 2021-05-21 DIAGNOSIS — D126 Benign neoplasm of colon, unspecified: Secondary | ICD-10-CM

## 2021-05-21 MED ORDER — ALBUTEROL SULFATE HFA 108 (90 BASE) MCG/ACT IN AERS
2.0000 | INHALATION_SPRAY | RESPIRATORY_TRACT | 0 refills | Status: DC | PRN
  Filled 2021-05-21: qty 8.5, 25d supply, fill #0

## 2021-05-21 MED ORDER — BENZONATATE 100 MG PO CAPS
100.0000 mg | ORAL_CAPSULE | Freq: Two times a day (BID) | ORAL | 0 refills | Status: DC | PRN
  Filled 2021-05-21: qty 20, 10d supply, fill #0

## 2021-05-21 MED ORDER — PREDNISONE 20 MG PO TABS
20.0000 mg | ORAL_TABLET | Freq: Every day | ORAL | 0 refills | Status: DC
  Filled 2021-05-21: qty 10, 10d supply, fill #0

## 2021-05-21 MED ORDER — ALBUTEROL SULFATE HFA 108 (90 BASE) MCG/ACT IN AERS
2.0000 | INHALATION_SPRAY | RESPIRATORY_TRACT | 0 refills | Status: DC | PRN
  Filled 2021-05-21: qty 18, 25d supply, fill #0

## 2021-05-21 MED FILL — Fluticasone-Salmeterol Aer Powder BA 250-50 MCG/ACT: RESPIRATORY_TRACT | 30 days supply | Qty: 60 | Fill #0 | Status: AC

## 2021-05-21 NOTE — Telephone Encounter (Signed)
Will forward to covering provider.

## 2021-05-21 NOTE — Telephone Encounter (Signed)
Patient call back and was triaged by another triage nurse- call forwarded to office

## 2021-05-21 NOTE — Telephone Encounter (Signed)
Addressed.

## 2021-05-21 NOTE — Telephone Encounter (Signed)
  Notes to clinic:  Requested script has expired  Review for continued use and refill   Requested Prescriptions  Pending Prescriptions Disp Refills   albuterol (VENTOLIN HFA) 108 (90 Base) MCG/ACT inhaler 18 g 0    Sig: INHALE 2 PUFFS INTO THE LUNGS EVERY 4 (FOUR) HOURS AS NEEDED FOR WHEEZING OR SHORTNESS OF BREATH.      Pulmonology:  Beta Agonists Failed - 05/21/2021 10:20 AM      Failed - One inhaler should last at least one month. If the patient is requesting refills earlier, contact the patient to check for uncontrolled symptoms.      Passed - Valid encounter within last 12 months    Recent Outpatient Visits           3 months ago Essential hypertension   [place], [place]   8 months ago Need for influenza vaccination   [place], RPH-CPP   8 months ago Essential hypertension   [place], MD   11 months ago Stress incontinence of urine   [place], [place], MD   1 year ago Essential hypertension   [facility] [place], MD

## 2021-05-21 NOTE — Telephone Encounter (Signed)
Answer Assessment - Initial Assessment Questions 1. RESPIRATORY STATUS: "Describe your breathing?" (e.g., wheezing, shortness of breath, unable to speak, severe coughing)      Chest tightness- COPD symptoms increasing 2. ONSET: "When did this breathing problem begin?"      Started Monday 3. PATTERN "Does the difficult breathing come and go, or has it been constant since it started?"      Nighttime is worse- sitting up- she is fine- chest tightness is constant 4. SEVERITY: "How bad is your breathing?" (e.g., mild, moderate, severe)    - MILD: No SOB at rest, mild SOB with walking, speaks normally in sentences, can lie down, no retractions, pulse < 100.    - MODERATE: SOB at rest, SOB with minimal exertion and prefers to sit, cannot lie down flat, speaks in phrases, mild retractions, audible wheezing, pulse 100-120.    - SEVERE: Very SOB at rest, speaks in single words, struggling to breathe, sitting hunched forward, retractions, pulse > 120      Patient is using inhaler mild/moderate 5. RECURRENT SYMPTOM: "Have you had difficulty breathing before?" If Yes, ask: "When was the last time?" and "What happened that time?"      COPD- patient believes she is getting Bronchitis 6. CARDIAC HISTORY: "Do you have any history of heart disease?" (e.g., heart attack, angina, bypass surgery, angioplasty)      *No Answer* 7. LUNG HISTORY: "Do you have any history of lung disease?"  (e.g., pulmonary embolus, asthma, emphysema)     *No Answer* 8. CAUSE: "What do you think is causing the breathing problem?"      *No Answer* 9. OTHER SYMPTOMS: "Do you have any other symptoms? (e.g., dizziness, runny nose, cough, chest pain, fever)     *No Answer* 10. O2 SATURATION MONITOR:  "Do you use an oxygen saturation monitor (pulse oximeter) at home?" If Yes, "What is your reading (oxygen level) today?" "What is your usual oxygen saturation reading?" (e.g., 95%)       *No Answer* 11. PREGNANCY: "Is there any chance you are  pregnant?" "When was your last menstrual period?"       *No Answer* 12. TRAVEL: "Have you traveled out of the country in the last month?" (e.g., travel history, exposures)       *No Answer*  Protocols used: Breathing Difficulty-A-AH

## 2021-05-21 NOTE — Telephone Encounter (Signed)
Contacted pt to go make aware of rxs being sent to the pharmacy pt doesn't have any questions or concerns

## 2021-05-21 NOTE — Telephone Encounter (Signed)
Reason for Disposition  [1] Patient says chest pain feels exactly the same as previously diagnosed "heartburn" AND [2] describes burning in chest AND [3] accompanying sour taste in mouth    Chest tightness and dry cough at night with shortness of breath is same symptoms she gets when COPD and bronchitis is acting up.   She believes it from exposure to the heat outside.   They prescribe prednisone and a cough syrup for me when this happens.    "I don't have any heart problems".  Answer Assessment - Initial Assessment Questions 1. LOCATION: "Where does it hurt?"       I'm having chest tightness.   I have COPD and have bronchitis.   When I breath my chest is real tight.  I'm using my inhalers but they are not working.   I need prednisone and cough medication.   Dry cough.   2. RADIATION: "Does the pain go anywhere else?" (e.g., into neck, jaw, arms, back)     In my chest 3. ONSET: "When did the chest pain begin?" (Minutes, hours or days)      Monday 4. PATTERN "Does the pain come and go, or has it been constant since it started?"  "Does it get worse with exertion?"      The tightness is constant. 5. DURATION: "How long does it last" (e.g., seconds, minutes, hours)     *No Answer* 6. SEVERITY: "How bad is the pain?"  (e.g., Scale 1-10; mild, moderate, or severe)    - MILD (1-3): doesn't interfere with normal activities     - MODERATE (4-7): interferes with normal activities or awakens from sleep    - SEVERE (8-10): excruciating pain, unable to do any normal activities       Moderate.   When I walk out to my car I'm short of breath.  I'm dry coughing a lot during the night.    7. CARDIAC RISK FACTORS: "Do you have any history of heart problems or risk factors for heart disease?" (e.g., angina, prior heart attack; diabetes, high blood pressure, high cholesterol, smoker, or strong family history of heart disease)     No 8. PULMONARY RISK FACTORS: "Do you have any history of lung disease?"  (e.g.,  blood clots in lung, asthma, emphysema, birth control pills)     COPD and bronchitis 9. CAUSE: "What do you think is causing the chest pain?"     My COPD and bronchitis.   The heat makes it act up.   This is the same tightness I get from my COPD.    "I don't have any heart conditions". 10. OTHER SYMPTOMS: "Do you have any other symptoms?" (e.g., dizziness, nausea, vomiting, sweating, fever, difficulty breathing, cough)       None of the above.   Shortness of breath when out in the heat.   Using her albuterol inhaler but it's not helping much right now.   Dr. Wynetta Emery prescribes me a cough syrup for the dry cough which is worse at night and prednisone. 11. PREGNANCY: "Is there any chance you are pregnant?" "When was your last menstrual period?"       N/A due to age  Protocols used: Chest Pain-A-AH

## 2021-05-21 NOTE — Telephone Encounter (Signed)
I have sent prescriptions for prednisone and cough medication to her pharmacy

## 2021-05-21 NOTE — Telephone Encounter (Signed)
Patient is calling to report she is having chest tightness with breathing and it is hard for her to breath when laying down. Patient thinks she is having a COPD exacerbation- possible Bronchitis. Patient states she had exposure to COVID and did home test that was negative-she is adamant not COVID and does not want to discuss that. Call disconnected during triage - attempted to contact patient with call back and had to leave message on VM- no answer.

## 2021-05-21 NOTE — Telephone Encounter (Signed)
Pt called in c/o having chest tightness and bronchitis with some shortness of breath due to the heat.   "I have this flare up on occasion".   They give me prednisone and a cough syrup.   She is having a dry hacking cough mostly at night.  I have sent a high priority note to [facility] for Dr. Karle Plumber.  Pt can be reached at [phone].

## 2021-05-21 NOTE — Telephone Encounter (Signed)
Copied from [place] [id number]. Topic: General - Other >> May 21, 2021  8:01 AM Leward Quan A wrote: Reason for CRM: Patient called in to ask Dr Wynetta Emery if she could please write her an Rx for Prednisone and some kind of cough medication because she is having some issues with her breathing and not feeling well in her chest area. Please advise Ph# [phone] >> May 21, 2021 10:38 AM Reyne Dumas L wrote: Patient called back and was sent to [facility] for chest tightness.  Please see NT note as well.

## 2021-06-09 ENCOUNTER — Other Ambulatory Visit: Payer: Self-pay

## 2021-06-09 MED FILL — Atorvastatin Calcium Tab 20 MG (Base Equivalent): ORAL | 90 days supply | Qty: 90 | Fill #0 | Status: AC

## 2021-06-09 MED FILL — Amlodipine Besylate Tab 10 MG (Base Equivalent): ORAL | 60 days supply | Qty: 60 | Fill #1 | Status: AC

## 2021-06-09 MED FILL — Hydrochlorothiazide Cap 12.5 MG: ORAL | 60 days supply | Qty: 60 | Fill #1 | Status: AC

## 2021-06-11 ENCOUNTER — Other Ambulatory Visit: Payer: Self-pay

## 2021-08-11 ENCOUNTER — Other Ambulatory Visit: Payer: Self-pay

## 2021-08-11 ENCOUNTER — Other Ambulatory Visit: Payer: Self-pay | Admitting: Internal Medicine

## 2021-08-11 DIAGNOSIS — I10 Essential (primary) hypertension: Secondary | ICD-10-CM

## 2021-08-11 MED ORDER — AMLODIPINE BESYLATE 10 MG PO TABS
ORAL_TABLET | Freq: Every day | ORAL | 0 refills | Status: DC
  Filled 2021-08-11: qty 30, 30d supply, fill #0

## 2021-08-11 MED ORDER — HYDROCHLOROTHIAZIDE 12.5 MG PO CAPS
ORAL_CAPSULE | Freq: Every day | ORAL | 0 refills | Status: DC
  Filled 2021-08-11: qty 30, 30d supply, fill #0

## 2021-08-11 NOTE — Telephone Encounter (Signed)
Patient will need an office visit for further refills. Requested Prescriptions  Pending Prescriptions Disp Refills  . hydrochlorothiazide (MICROZIDE) 12.5 MG capsule 30 capsule 0    Sig: TAKE 1 TABLET (12.5 MG TOTAL) BY MOUTH DAILY.     Cardiovascular: Diuretics - Thiazide Failed - 08/11/2021 11:04 AM      Failed - Cr in normal range and within 360 days    Creat  Date Value Ref Range Status  01/15/2016 1.22 (H) 0.50 - 0.99 mg/dL Final   Creatinine, Ser  Date Value Ref Range Status  01/27/2021 1.44 (H) 0.57 - 1.00 mg/dL Final         Failed - Valid encounter within last 6 months    Recent Outpatient Visits          6 months ago Essential hypertension   [place] Ladell Pier, MD   11 months ago Need for influenza vaccination   [place], RPH-CPP   11 months ago Essential hypertension   [place] Ladell Pier, MD   1 year ago Stress incontinence of urine   [place], Charlane Ferretti, MD   1 year ago Essential hypertension   [place] Ladell Pier, MD             Passed - Ca in normal range and within 360 days    Calcium  Date Value Ref Range Status  01/27/2021 9.2 8.7 - 10.3 mg/dL Final   Calcium, Ion  Date Value Ref Range Status  04/14/2019 1.12 (L) 1.15 - 1.40 mmol/L Final         Passed - K in normal range and within 360 days    Potassium  Date Value Ref Range Status  01/27/2021 3.7 3.5 - 5.2 mmol/L Final         Passed - Na in normal range and within 360 days    Sodium  Date Value Ref Range Status  01/27/2021 142 134 - 144 mmol/L Final         Passed - Last BP in normal range    BP Readings from Last 1 Encounters:  09/14/20 126/81         . amLODipine (NORVASC) 10 MG tablet 30 tablet 0    Sig: TAKE 1 TABLET (10 MG TOTAL) BY MOUTH DAILY. MUST HAVE OFFICE  VISIT FOR REFILLS     Cardiovascular:  Calcium Channel Blockers Failed - 08/11/2021 11:04 AM      Failed - Valid encounter within last 6 months    Recent Outpatient Visits          6 months ago Essential hypertension   [place], MD   11 months ago Need for influenza vaccination   [place], RPH-CPP   11 months ago Essential hypertension   [place] Ladell Pier, MD   1 year ago Stress incontinence of urine   [place], MD   1 year ago Essential hypertension   [place], MD             Passed - Last BP in normal range    BP Readings from Last 1 Encounters:  09/14/20 126/81

## 2021-08-12 ENCOUNTER — Other Ambulatory Visit: Payer: Self-pay

## 2021-08-13 ENCOUNTER — Other Ambulatory Visit: Payer: Self-pay

## 2021-08-16 ENCOUNTER — Ambulatory Visit
Admission: EM | Admit: 2021-08-16 | Discharge: 2021-08-16 | Disposition: A | Discharge status: Other Acute Inpatient Hospital | Payer: Medicare Other | Attending: Internal Medicine | Admitting: Internal Medicine

## 2021-08-16 ENCOUNTER — Other Ambulatory Visit: Payer: Self-pay

## 2021-08-16 ENCOUNTER — Observation Stay (HOSPITAL_COMMUNITY): Payer: Medicare Other

## 2021-08-16 ENCOUNTER — Encounter: Payer: Self-pay | Admitting: Emergency Medicine

## 2021-08-16 ENCOUNTER — Emergency Department (HOSPITAL_COMMUNITY): Payer: Medicare Other

## 2021-08-16 ENCOUNTER — Inpatient Hospital Stay (HOSPITAL_COMMUNITY)
Admission: EM | Admit: 2021-08-16 | Discharge: 2021-08-21 | DRG: 193 | Disposition: A | Discharge status: Home / Self Care | Payer: Medicare Other | Attending: Student in an Organized Health Care Education/Training Program | Admitting: Student in an Organized Health Care Education/Training Program

## 2021-08-16 ENCOUNTER — Encounter (HOSPITAL_COMMUNITY): Payer: Self-pay

## 2021-08-16 ENCOUNTER — Ambulatory Visit: Payer: Medicare Other | Admitting: Nurse Practitioner

## 2021-08-16 DIAGNOSIS — R0902 Hypoxemia: Secondary | ICD-10-CM

## 2021-08-16 DIAGNOSIS — Z823 Family history of stroke: Secondary | ICD-10-CM

## 2021-08-16 DIAGNOSIS — J432 Centrilobular emphysema: Secondary | ICD-10-CM | POA: Diagnosis present

## 2021-08-16 DIAGNOSIS — J42 Unspecified chronic bronchitis: Secondary | ICD-10-CM

## 2021-08-16 DIAGNOSIS — J101 Influenza due to other identified influenza virus with other respiratory manifestations: Principal | ICD-10-CM | POA: Diagnosis present

## 2021-08-16 DIAGNOSIS — N281 Cyst of kidney, acquired: Secondary | ICD-10-CM | POA: Diagnosis not present

## 2021-08-16 DIAGNOSIS — I129 Hypertensive chronic kidney disease with stage 1 through stage 4 chronic kidney disease, or unspecified chronic kidney disease: Secondary | ICD-10-CM | POA: Diagnosis present

## 2021-08-16 DIAGNOSIS — R0602 Shortness of breath: Secondary | ICD-10-CM | POA: Diagnosis not present

## 2021-08-16 DIAGNOSIS — R0603 Acute respiratory distress: Secondary | ICD-10-CM

## 2021-08-16 DIAGNOSIS — R079 Chest pain, unspecified: Secondary | ICD-10-CM | POA: Diagnosis not present

## 2021-08-16 DIAGNOSIS — N393 Stress incontinence (female) (male): Secondary | ICD-10-CM | POA: Diagnosis present

## 2021-08-16 DIAGNOSIS — N179 Acute kidney failure, unspecified: Secondary | ICD-10-CM | POA: Diagnosis not present

## 2021-08-16 DIAGNOSIS — Z20822 Contact with and (suspected) exposure to covid-19: Secondary | ICD-10-CM | POA: Diagnosis present

## 2021-08-16 DIAGNOSIS — Z833 Family history of diabetes mellitus: Secondary | ICD-10-CM | POA: Diagnosis not present

## 2021-08-16 DIAGNOSIS — R040 Epistaxis: Secondary | ICD-10-CM | POA: Diagnosis not present

## 2021-08-16 DIAGNOSIS — J849 Interstitial pulmonary disease, unspecified: Secondary | ICD-10-CM | POA: Diagnosis present

## 2021-08-16 DIAGNOSIS — R7303 Prediabetes: Secondary | ICD-10-CM | POA: Diagnosis not present

## 2021-08-16 DIAGNOSIS — F17210 Nicotine dependence, cigarettes, uncomplicated: Secondary | ICD-10-CM | POA: Diagnosis present

## 2021-08-16 DIAGNOSIS — J441 Chronic obstructive pulmonary disease with (acute) exacerbation: Secondary | ICD-10-CM

## 2021-08-16 DIAGNOSIS — E785 Hyperlipidemia, unspecified: Secondary | ICD-10-CM | POA: Diagnosis present

## 2021-08-16 DIAGNOSIS — J9601 Acute respiratory failure with hypoxia: Secondary | ICD-10-CM | POA: Diagnosis not present

## 2021-08-16 DIAGNOSIS — R509 Fever, unspecified: Secondary | ICD-10-CM | POA: Diagnosis not present

## 2021-08-16 DIAGNOSIS — F17200 Nicotine dependence, unspecified, uncomplicated: Secondary | ICD-10-CM

## 2021-08-16 DIAGNOSIS — N189 Chronic kidney disease, unspecified: Secondary | ICD-10-CM | POA: Diagnosis present

## 2021-08-16 DIAGNOSIS — R0789 Other chest pain: Secondary | ICD-10-CM | POA: Diagnosis not present

## 2021-08-16 DIAGNOSIS — R6889 Other general symptoms and signs: Secondary | ICD-10-CM | POA: Diagnosis not present

## 2021-08-16 DIAGNOSIS — E871 Hypo-osmolality and hyponatremia: Secondary | ICD-10-CM | POA: Diagnosis not present

## 2021-08-16 DIAGNOSIS — Z8249 Family history of ischemic heart disease and other diseases of the circulatory system: Secondary | ICD-10-CM

## 2021-08-16 DIAGNOSIS — Z743 Need for continuous supervision: Secondary | ICD-10-CM | POA: Diagnosis not present

## 2021-08-16 DIAGNOSIS — E876 Hypokalemia: Secondary | ICD-10-CM | POA: Diagnosis present

## 2021-08-16 DIAGNOSIS — Z8042 Family history of malignant neoplasm of prostate: Secondary | ICD-10-CM

## 2021-08-16 DIAGNOSIS — J111 Influenza due to unidentified influenza virus with other respiratory manifestations: Secondary | ICD-10-CM | POA: Diagnosis not present

## 2021-08-16 DIAGNOSIS — Z90710 Acquired absence of both cervix and uterus: Secondary | ICD-10-CM

## 2021-08-16 DIAGNOSIS — Z720 Tobacco use: Secondary | ICD-10-CM | POA: Diagnosis present

## 2021-08-16 LAB — COMPREHENSIVE METABOLIC PANEL
ALT: 13 U/L (ref 0–44)
AST: 22 U/L (ref 15–41)
Albumin: 3.1 g/dL — ABNORMAL LOW (ref 3.5–5.0)
Alkaline Phosphatase: 98 U/L (ref 38–126)
Anion gap: 10 (ref 5–15)
BUN: 11 mg/dL (ref 8–23)
CO2: 25 mmol/L (ref 22–32)
Calcium: 8.3 mg/dL — ABNORMAL LOW (ref 8.9–10.3)
Chloride: 99 mmol/L (ref 98–111)
Creatinine, Ser: 1.58 mg/dL — ABNORMAL HIGH (ref 0.44–1.00)
GFR, Estimated: 36 mL/min — ABNORMAL LOW (ref >60)
Glucose, Bld: 115 mg/dL — ABNORMAL HIGH (ref 70–99)
Potassium: 3.1 mmol/L — ABNORMAL LOW (ref 3.5–5.1)
Sodium: 134 mmol/L — ABNORMAL LOW (ref 135–145)
Total Bilirubin: 0.7 mg/dL (ref 0.3–1.2)
Total Protein: 7.1 g/dL (ref 6.5–8.1)

## 2021-08-16 LAB — CBC WITH DIFFERENTIAL/PLATELET
Abs Immature Granulocytes: 0.03 10*3/uL (ref 0.00–0.07)
Basophils Absolute: 0.1 10*3/uL (ref 0.0–0.1)
Basophils Relative: 1 %
Eosinophils Absolute: 0 10*3/uL (ref 0.0–0.5)
Eosinophils Relative: 0 %
HCT: 42.3 % (ref 36.0–46.0)
Hemoglobin: 14.1 g/dL (ref 12.0–15.0)
Immature Granulocytes: 0 %
Lymphocytes Relative: 20 %
Lymphs Abs: 1.5 10*3/uL (ref 0.7–4.0)
MCH: 29.6 pg (ref 26.0–34.0)
MCHC: 33.3 g/dL (ref 30.0–36.0)
MCV: 88.7 fL (ref 80.0–100.0)
Monocytes Absolute: 0.5 10*3/uL (ref 0.1–1.0)
Monocytes Relative: 7 %
Neutro Abs: 5.4 10*3/uL (ref 1.7–7.7)
Neutrophils Relative %: 72 %
Platelets: 225 10*3/uL (ref 150–400)
RBC: 4.77 MIL/uL (ref 3.87–5.11)
RDW: 18.2 % — ABNORMAL HIGH (ref 11.5–15.5)
WBC: 7.5 10*3/uL (ref 4.0–10.5)
nRBC: 0 % (ref 0.0–0.2)

## 2021-08-16 LAB — RESP PANEL BY RT-PCR (FLU A&B, COVID) ARPGX2
Influenza A by PCR: POSITIVE — AB
Influenza B by PCR: NEGATIVE
SARS Coronavirus 2 by RT PCR: NEGATIVE

## 2021-08-16 MED ORDER — OSELTAMIVIR PHOSPHATE 30 MG PO CAPS
30.0000 mg | ORAL_CAPSULE | Freq: Once | ORAL | Status: AC
  Administered 2021-08-16: 30 mg via ORAL
  Filled 2021-08-16: qty 1

## 2021-08-16 MED ORDER — PREDNISONE 20 MG PO TABS: 60.0000 mg | ORAL_TABLET | Freq: Every day | ORAL | Status: DC

## 2021-08-16 MED ORDER — FLUTICASONE FUROATE-VILANTEROL 200-25 MCG/INH IN AEPB
1.0000 | INHALATION_SPRAY | Freq: Every day | RESPIRATORY_TRACT | Status: DC
  Administered 2021-08-17 – 2021-08-20 (×4): 1 via RESPIRATORY_TRACT
  Filled 2021-08-16: qty 28

## 2021-08-16 MED ORDER — SENNOSIDES-DOCUSATE SODIUM 8.6-50 MG PO TABS: 1.0000 | ORAL_TABLET | Freq: Every evening | ORAL | Status: DC | PRN

## 2021-08-16 MED ORDER — IPRATROPIUM-ALBUTEROL 0.5-2.5 (3) MG/3ML IN SOLN
3.0000 mL | Freq: Once | RESPIRATORY_TRACT | Status: AC
  Administered 2021-08-16: 3 mL via RESPIRATORY_TRACT
  Filled 2021-08-16: qty 3

## 2021-08-16 MED ORDER — OSELTAMIVIR PHOSPHATE 30 MG PO CAPS
30.0000 mg | ORAL_CAPSULE | Freq: Two times a day (BID) | ORAL | Status: DC
  Administered 2021-08-17 – 2021-08-18 (×3): 30 mg via ORAL
  Filled 2021-08-16 (×4): qty 1

## 2021-08-16 MED ORDER — ACETAMINOPHEN 650 MG RE SUPP: 650.0000 mg | Freq: Four times a day (QID) | RECTAL | Status: DC | PRN

## 2021-08-16 MED ORDER — ENOXAPARIN SODIUM 40 MG/0.4ML IJ SOSY
40.0000 mg | PREFILLED_SYRINGE | INTRAMUSCULAR | Status: DC
  Administered 2021-08-16 – 2021-08-19 (×4): 40 mg via SUBCUTANEOUS
  Filled 2021-08-16 (×4): qty 0.4

## 2021-08-16 MED ORDER — NICOTINE 14 MG/24HR TD PT24
14.0000 mg | MEDICATED_PATCH | Freq: Every day | TRANSDERMAL | Status: DC
  Administered 2021-08-16 – 2021-08-20 (×5): 14 mg via TRANSDERMAL
  Filled 2021-08-16 (×5): qty 1

## 2021-08-16 MED ORDER — ACETAMINOPHEN 500 MG PO TABS: 1000.0000 mg | ORAL_TABLET | Freq: Once | ORAL | Status: DC

## 2021-08-16 MED ORDER — PREDNISONE 20 MG PO TABS
60.0000 mg | ORAL_TABLET | Freq: Once | ORAL | Status: AC
  Administered 2021-08-16: 60 mg via ORAL
  Filled 2021-08-16: qty 3

## 2021-08-16 MED ORDER — IBUPROFEN 800 MG PO TABS
800.0000 mg | ORAL_TABLET | Freq: Once | ORAL | Status: AC
  Administered 2021-08-16: 800 mg via ORAL
  Filled 2021-08-16: qty 1

## 2021-08-16 MED ORDER — POTASSIUM CHLORIDE 20 MEQ PO PACK
40.0000 meq | PACK | Freq: Once | ORAL | Status: AC
  Administered 2021-08-17: 40 meq via ORAL
  Filled 2021-08-16: qty 2

## 2021-08-16 MED ORDER — IPRATROPIUM-ALBUTEROL 0.5-2.5 (3) MG/3ML IN SOLN
3.0000 mL | RESPIRATORY_TRACT | Status: DC
  Administered 2021-08-17 (×6): 3 mL via RESPIRATORY_TRACT
  Filled 2021-08-16 (×6): qty 3

## 2021-08-16 MED ORDER — ACETAMINOPHEN 325 MG PO TABS
650.0000 mg | ORAL_TABLET | Freq: Four times a day (QID) | ORAL | Status: DC | PRN
  Administered 2021-08-17 – 2021-08-19 (×4): 650 mg via ORAL
  Filled 2021-08-16 (×4): qty 2

## 2021-08-16 MED ORDER — OSELTAMIVIR PHOSPHATE 30 MG PO CAPS
30.0000 mg | ORAL_CAPSULE | Freq: Two times a day (BID) | ORAL | Status: DC
  Filled 2021-08-16: qty 1

## 2021-08-16 MED ORDER — ACETAMINOPHEN 325 MG PO TABS
650.0000 mg | ORAL_TABLET | Freq: Once | ORAL | Status: AC
  Administered 2021-08-16: 650 mg via ORAL

## 2021-08-16 NOTE — ED Notes (Signed)
Pt oxygen saturation dropped to 84% on room air, 3L [place] applied, saturations increased to 94%

## 2021-08-16 NOTE — ED Triage Notes (Signed)
Pt BIB [facility] from [facility] with a 1 day hx of ShOB, congested cough, and fever. Pt with hx of COPD. EMS admin 5mg  of albuterol and 544mcg of atrovent PTA. Report diffuse expiratory wheezes.

## 2021-08-16 NOTE — ED Triage Notes (Signed)
Patient c/o SOB x 1 day, history of COPD, cough and congestion.

## 2021-08-16 NOTE — Discharge Instructions (Addendum)
Patient was sent to the hospital via EMS.  

## 2021-08-16 NOTE — H&P (Addendum)
Date: 08/16/2021               Patient Name:  Krystal Allen MRN: [id number]  DOB: 1956-03-16 Age / Sex: 65 y.o., female   PCP: Ladell Pier, MD         Medical Service: Internal Medicine Teaching Service         Attending Physician: Dr. Charise Killian, MD    First Contact: Dr. Idamae Schuller Pager: [phone]  Second Contact: Dr. Sanjuana Letters Pager: [phone]       After Hours (After 5p/  First Contact Pager: [phone]  weekends / holidays): Second Contact Pager: [phone]   Chief Complaint: shortness of breath, nasal congestion, fever  History of Present Illness: Krystal Allen is a 65 yo female with COPD not on supplemental O2, HTN, and HLD who presents to [facility] with 2-3 days of shortness of breath, nasal congestion, productive cough of yellow sputum, body aches, fever, and chills She was recently exposed to a family member who tested positive for the flu. The patient states that over this time period, her breathing has not improved even with the use of her Advair inhaler and albuterol. Her cough is so severe at times, that she is incontinent of her urine when she coughs, which is the most bothersome to the patient. She thus sought evaluation at an urgent care and was advised to come to the hospital, as she was requiring supplemental O2. The patient was placed on nasal cannula upon arrival to the ED and notes that this, in addition to the breathing treatments, has helped her to feel better. She denies any headache, dizziness, lightheadedness, n/v/d, abd pain, dysuria, hematuria, or any other sxs at this time.   In the ED, CBC was unremarkable. Cr was elevated at 1.58, up from ~1.1 about 1 year ago and GFR 36. K 3.1 and Na 134. Respiratory panel positive for influenza A, negative for influenza B and COVID.    Meds:  Albuterol  Amlodipine 10 mg  Aspirin 81 mg Lipitor 20 mg Buspirone 5 mg Flonase  Advair 250-50 HCTZ 12.5 mg Omeprazole 20 mg Oxybutynin 5 mg  No  outpatient medications have been marked as taking for the 08/16/21 encounter [facility] Encounter).     Allergies: Allergies as of 08/16/2021 - Review Complete 08/16/2021  Allergen Reaction Noted   Sulfa antibiotics Anaphylaxis and Hives 01/16/2012   Chantix [varenicline tartrate]  12/22/2017   Fish allergy Other (See Comments) 07/23/2012   Penicillins Other (See Comments) 01/16/2012   Latex Rash and Other (See Comments) 07/01/2014   Past Medical History:  Diagnosis Date   Allergy    COPD (chronic obstructive pulmonary disease) (HCC)    Depression    GERD (gastroesophageal reflux disease)    Hernia, abdominal    History of chicken pox    Hyperlipidemia    Hypertension    Internal hemorrhoids with Grade 3 prolapse and bleeding 06/19/2013   Osteoarthritis    Personal history of colonic adenomas 06/25/2013    Family History: HTN, DM, cardiovascular disease  Social History: Smokes ~1/2 pack of cigarettes per day. Very rare alcohol use. Denies any other drug use. Lives at home with her son, who is with the patient currently. She is able to complete her ADLs at home and does not require any assistive devices to ambulate.   Review of Systems: A complete ROS was negative except as per HPI.   Physical Exam: Blood pressure 115/67, pulse 72, temperature  85 F (37.2 C), temperature source Oral, resp. rate 20, height 5\' 7"  (1.702 m), weight 110.7 kg, SpO2 93 %. Physical Exam Constitutional:      General: She is not in acute distress.    Appearance: Normal appearance.  HENT:     Head: Normocephalic and atraumatic.     Nose: Congestion present.  Eyes:     Extraocular Movements: Extraocular movements intact.     Pupils: Pupils are equal, round, and reactive to light.  Cardiovascular:     Rate and Rhythm: Normal rate and regular rhythm.     Pulses: Normal pulses.     Heart sounds: Normal heart sounds. No murmur heard. Pulmonary:     Effort: No respiratory distress.     Breath  sounds: Wheezing present. No rhonchi or rales.     Comments: Diffuse expiratory wheezing throughout all lung fields, worse at bilateral lung bases Increased work of breathing Abdominal:     General: Bowel sounds are normal. There is no distension.     Palpations: Abdomen is soft.     Tenderness: There is no abdominal tenderness.  Musculoskeletal:     Right lower leg: No edema.     Left lower leg: No edema.  Skin:    General: Skin is warm and dry.  Neurological:     General: No focal deficit present.     Mental Status: She is alert and oriented to person, place, and time. Mental status is at baseline.  Psychiatric:        Mood and Affect: Mood normal.        Behavior: Behavior normal.    EKG: personally reviewed my interpretation is normal sinus rhythm at 98 bpm with RAD, no significant changes when compared to prior EKG from 03/2017  CXR: personally reviewed my interpretation is: chronic lung disease appearance with no evidence of pneumonia   Assessment & Plan by Problem: Principal Problem:   Influenza A Active Problems:   Tobacco abuse   Centrilobular emphysema ([place])  #Influenza  #COPD Patient presented with 2 days of shortness of breath, cough, nasal congestion, and fever. Exposed to family member who tested positive for the flu and the patient is now positive for influenza A. Tested negative for Influenza B and COVID. No leukocytosis. CXR with no evidence of pneumonia. The patient was initially requiring 6 L [place] and is now on 3 L [place]. Received 1 dose of Tamiflu and 60 mg of prednisone.  - Maintain SpO2 >90%, supplemental O2 as needed - Continue Tamiflu 30 mg BID (renal dosing given GFR 36) and prednisone 40 mg daily - Breo Ellipta 1 puff daily and Duonebs q4h while awake - Blood cultures pending - PT/OT recs pending, may require supplemental O2 on discharge  #Hypokalemia K 3.1 on admission. - 40 mEq Kclor packet given   #Stress incontinence Patient notes that when she  coughs, she is incontinent of her urine.   #AKI vs CKD Cr 1.58, up from 1.44 6 months ago, but baseline appeared to be around 1.1 prior to that. GFR 36, unsure of prior GFR.  - Follow up morning BMP - Renal ultrasound ordered  Best practices: Code: Full Diet: Carb modified  PT/OT: recs pending DVT prophylaxis: Lovenox 40 mg   Dispo: Admit patient to Observation with expected length of stay less than 2 midnights. Barriers to discharge: May require supplemental O2 on discharge  Signed: Dorethea Clan, DO 08/16/2021, 10:41 PM  Pager: @[phone] @ After 5pm on weekdays and 1pm  on weekends: On Call pager: [phone]

## 2021-08-16 NOTE — ED Provider Notes (Addendum)
[facility] EMERGENCY DEPARTMENT Provider Note   CSN: [id number] Arrival date & time: 08/16/21  1130     History Chief Complaint  Patient presents with   Shortness of Breath   Fever    Krystal Allen is a 65 y.o. female with past medical history significant for COPD who presents with 2 days of shortness of breath, nasal congestion, cough, fever.  Patient reports that she was recently around a grandchild who did test positive for the flu recently.  Patient denies any chest pain.  Patient reports that she is chronically on Advair for her COPD, has not ever required oxygen at home.  Patient was seen in urgent care, and was requiring significant oxygen, patient has also received 5 mg of albuterol, 500 mcgs of Atrovent prior to arrival.  Patient did have some diffuse expiratory wheezing was noted by EMS.  Patient denies abdominal pain, nausea, vomiting, diarrhea, myalgia, chills, night sweats.  Patient reports her sputum is yellow to white in color.   Shortness of Breath Associated symptoms: cough and fever   Fever Associated symptoms: cough       Past Medical History:  Diagnosis Date   Allergy    COPD (chronic obstructive pulmonary disease) (HCC)    Depression    GERD (gastroesophageal reflux disease)    Hernia, abdominal    History of chicken pox    Hyperlipidemia    Hypertension    Internal hemorrhoids with Grade 3 prolapse and bleeding 06/19/2013   Osteoarthritis    Personal history of colonic adenomas 06/25/2013    Patient Active Problem List   Diagnosis Date Noted   Influenza vaccine needed 09/14/2020   Tobacco dependence 05/12/2020   Aortic atherosclerosis ([place]) 05/12/2020   Adenomatous polyp of colon 05/12/2020   ILD (interstitial lung disease) ([place]) 01/26/2018   Cigarette smoker 01/26/2018   Osteoarthritis of cervical spine 12/22/2017   Prediabetes 12/22/2017   Pain of left calf 04/06/2017   Centrilobular emphysema ([place]) 02/23/2016   Obesity  (BMI 30-39.9) 01/15/2016   Bilateral shoulder pain 01/15/2016   Esophageal reflux 01/15/2016   Tobacco abuse 05/13/2015   Lung nodule 08/14/2014   Impaired fasting glucose 08/14/2014   Hyperlipidemia 08/14/2014   Personal history of colonic adenomas 06/25/2013   HTN (hypertension) 05/20/2013    Past Surgical History:  Procedure Laterality Date   ABDOMINAL HYSTERECTOMY  21yrs ago   due to heavy bleeding and fibroids    COLONOSCOPY     HEMORRHOID BANDING  2014   HEMORRHOID SURGERY  [id number] agao   UMBILICAL HERNIA REPAIR  06/2015     OB History   No obstetric history on file.     Family History  Problem Relation Age of Onset   Cancer Father        prostate   Hypertension Mother    Diabetes Mother    Hypertension Sister    Heart disease Maternal Uncle    Stroke Maternal Grandmother    Hypertension Maternal Grandmother    Hypertension Sister    Colon cancer Neg Hx    Stomach cancer Neg Hx    Rectal cancer Neg Hx    Colon polyps Neg Hx     Social History   Tobacco Use   Smoking status: Every Day    Packs/day: 0.25    Types: E-cigarettes, Cigarettes   Smokeless tobacco: Never   Tobacco comments:    06/30/20 has cut down on cigarettes using gum and occasional vaping  Vaping Use  Vaping Use: Every day  Substance Use Topics   Alcohol use: Yes    Comment: occasional   Drug use: No    Home Medications Prior to Admission medications   Medication Sig Start Date End Date Taking? Authorizing Provider  acetaminophen (TYLENOL 8 HOUR) 650 MG CR tablet Take 1 tablet (650 mg total) by mouth 2 (two) times daily as needed for pain. 04/06/17   Boykin Nearing, MD  albuterol (PROAIR HFA) 108 (90 Base) MCG/ACT inhaler Inhale 2 puffs into the lungs every 4 (four) hours as needed for wheezing or shortness of breath. 05/21/21   Ladell Pier, MD  albuterol (PROVENTIL) (2.5 MG/3ML) 0.083% nebulizer solution TAKE 3 MLS BY NEBULIZATION EVERY 6 (SIX) HOURS AS NEEDED FOR WHEEZING OR  SHORTNESS OF BREATH. 03/05/20   Freeman Caldron M, PA-C  amLODipine (NORVASC) 10 MG tablet TAKE 1 TABLET (10 MG TOTAL) BY MOUTH DAILY. MUST HAVE OFFICE VISIT FOR REFILLS 08/11/21 08/11/22  Ladell Pier, MD  aspirin EC 81 MG tablet Take 1 tablet (81 mg total) by mouth daily. 05/12/20   Ladell Pier, MD  atorvastatin (LIPITOR) 20 MG tablet TAKE 1 TABLET (20 MG TOTAL) BY MOUTH DAILY. 01/28/21 01/28/22  Ladell Pier, MD  benzonatate (TESSALON) 100 MG capsule Take 1 capsule (100 mg total) by mouth 2 (two) times daily as needed for cough. 05/21/21   Charlott Rakes, MD  busPIRone (BUSPAR) 5 MG tablet Take 1 tablet (5 mg total) by mouth 2 (two) times daily. 07/07/20   Ladell Pier, MD  cetirizine (ZYRTEC) 10 MG tablet Take 1 tablet (10 mg total) by mouth daily. 06/16/20   Charlott Rakes, MD  fluticasone (FLONASE) 50 MCG/ACT nasal spray Place 2 sprays into both nostrils daily. 10/17/18   Argentina Donovan, PA-C  fluticasone-salmeterol (ADVAIR) 250-50 MCG/ACT AEPB Inhale into the lungs. 01/22/21 01/22/22  Ladell Pier, MD  guaiFENesin-codeine 100-10 MG/5ML syrup Take 5 mLs by mouth 3 (three) times daily as needed for cough. 03/05/20   Argentina Donovan, PA-C  hydrochlorothiazide (HYDRODIURIL) 12.5 MG tablet Take 1 tablet (12.5 mg total) by mouth daily. 01/22/21   Ladell Pier, MD  hydrochlorothiazide (MICROZIDE) 12.5 MG capsule TAKE 1 CAPSULE (12.5 MG TOTAL) BY MOUTH DAILY. 09/14/20 09/14/21  Ladell Pier, MD  hydrochlorothiazide (MICROZIDE) 12.5 MG capsule TAKE 1 TABLET (12.5 MG TOTAL) BY MOUTH DAILY. 06/16/20 06/16/21  Charlott Rakes, MD  hydrochlorothiazide (MICROZIDE) 12.5 MG capsule TAKE 1 TABLET (12.5 MG TOTAL) BY MOUTH DAILY. 08/11/21 08/11/22  Ladell Pier, MD  hydrocortisone-pramoxine (PROCTOFOAM [facility]) rectal foam Place 1 applicator rectally 2 (two) times daily. Apply externally twice daily for 10 days. 07/14/20   Jackquline Denmark, MD  nicotine polacrilex (NICORETTE) 2 MG  gum 2 1 TO 2 PIECES AS NEEDED TO DECREASE CRAVINGS. MAX 30 PIECES/DAY 01/22/21 01/22/22  Ladell Pier, MD  omeprazole (PRILOSEC) 20 MG capsule Take 1 capsule (20 mg total) by mouth daily. 02/10/20   Camillia Herter, NP  oxybutynin (DITROPAN) 5 MG tablet Take 1 tablet (5 mg total) by mouth 2 (two) times daily. 06/16/20   Charlott Rakes, MD  predniSONE (DELTASONE) 20 MG tablet Take 1 tablet (20 mg total) by mouth daily with breakfast. 05/21/21   Charlott Rakes, MD  Respiratory Therapy Supplies (FLUTTER) DEVI Use as directed 12/01/17   Marshell Garfinkel, MD    Allergies    Sulfa antibiotics, Chantix [varenicline tartrate], Fish allergy, Penicillins, and Latex  Review of Systems   Review  of Systems  Constitutional:  Positive for fever.  Respiratory:  Positive for cough and shortness of breath.   All other systems reviewed and are negative.  Physical Exam Updated Vital Signs BP 111/77   Pulse 74   Temp 99 F (37.2 C) (Oral)   Resp (!) 21   Ht 5\' 7"  (1.702 m)   Wt 110.7 kg   SpO2 91%   BMI 38.22 kg/m   Physical Exam Vitals and nursing note reviewed.  Constitutional:      General: She is not in acute distress.    Appearance: Normal appearance.  HENT:     Head: Normocephalic and atraumatic.  Eyes:     General:        Right eye: No discharge.        Left eye: No discharge.  Cardiovascular:     Rate and Rhythm: Normal rate and regular rhythm.     Heart sounds: No murmur heard.   No friction rub. No gallop.  Pulmonary:     Effort: Pulmonary effort is normal. No respiratory distress.     Comments: Diffuse expiratory wheezing throughout all lung fields.  No area of consolidation was noted.  Patient is in no respiratory distress on 3 L of nasal cannula oxygen.  No rhonchi auscultated, no tenderness to chest wall. Occasional tachypnea. SOB without nasal cannula. Abdominal:     General: Bowel sounds are normal.     Palpations: Abdomen is soft.  Musculoskeletal:        General: No  deformity.  Skin:    General: Skin is warm and dry.     Capillary Refill: Capillary refill takes less than 2 seconds.     Nails: There is no clubbing.  Neurological:     Mental Status: She is alert and oriented to person, place, and time.  Psychiatric:        Mood and Affect: Mood normal.        Behavior: Behavior normal.    ED Results / Procedures / Treatments   Labs (all labs ordered are listed, but only abnormal results are displayed) Labs Reviewed  RESP PANEL BY RT-PCR (FLU A&B, COVID) ARPGX2 - Abnormal; Notable for the following components:      Result Value   Influenza A by PCR POSITIVE (*)    All other components within normal limits  CBC WITH DIFFERENTIAL/PLATELET - Abnormal; Notable for the following components:   RDW 18.2 (*)    All other components within normal limits  COMPREHENSIVE METABOLIC PANEL - Abnormal; Notable for the following components:   Sodium 134 (*)    Potassium 3.1 (*)    Glucose, Bld 115 (*)    Creatinine, Ser 1.58 (*)    Calcium 8.3 (*)    Albumin 3.1 (*)    GFR, Estimated 36 (*)    All other components within normal limits    EKG EKG Interpretation  Date/Time:  Monday August 16 2021 11:36:54 EDT Ventricular Rate:  98 PR Interval:  172 QRS Duration: 77 QT Interval:  357 QTC Calculation: 456 R Axis:   114 Text Interpretation: Sinus rhythm Biatrial enlargement Right axis deviation No significant change since 6/18 Confirmed by Aletta Edouard [phone]) on 08/16/2021 4:21:31 PM  Radiology DG Chest Portable 1 View  Result Date: 08/16/2021 CLINICAL DATA:  Shortness of breath EXAM: PORTABLE CHEST 1 VIEW COMPARISON:  Chest x-ray dated July 10, 2020 FINDINGS: Cardiac and mediastinal contours are unchanged. No focal consolidation. Similar background reticular opacities, compatible with  underlying parenchymal disease. No large pleural effusion or pneumothorax. IMPRESSION: Background chronic lung disease with no focal consolidation to suggest  pneumonia. Electronically Signed   By: Yetta Glassman M.D.   On: 08/16/2021 13:32    Procedures Procedures   Medications Ordered in ED Medications  ibuprofen (ADVIL) tablet 800 mg (800 mg Oral Given 08/16/21 1305)  ipratropium-albuterol (DUONEB) 0.5-2.5 (3) MG/3ML nebulizer solution 3 mL (3 mLs Nebulization Given 08/16/21 1623)  predniSONE (DELTASONE) tablet 60 mg (60 mg Oral Given 08/16/21 1622)  oseltamivir (TAMIFLU) capsule 30 mg (30 mg Oral Given 08/16/21 1846)    ED Course  I have reviewed the triage vital signs and the nursing notes.  Pertinent labs & imaging results that were available during my care of the patient were reviewed by me and considered in my medical decision making (see chart for details).    MDM Rules/Calculators/A&P                         I discussed this case with my attending physician who cosigned this note including patient's presenting symptoms, physical exam, and planned diagnostics and interventions. Attending physician stated agreement with plan or made changes to plan which were implemented.   Attending physician assessed patient at bedside.  Concern for COVID, flu, early pneumonia.  Will obtain respiratory panel, chest x-ray, basic labs.  We will treat patient's increased oxygen requirement with oxygen, breathing treatments, steroids at this time.  No evidence of pneumonia.  Patient with some improvement of baseline oxygen saturation on 3 L, after breathing treatment.  Patient still with some diffuse wheezing.  Patient given additional albuterol breathing treatment, high-dose prednisone.  COVID/flu swab still pending at this time.  Patient with acute hypoxic respiratory failure in setting of likely flare given exposure to PCR positive flu from grandson several days ago.  Patient continues to require 3 L of oxygen, rapidly desaturates when oxygen is removed.  Patient with some soft blood pressures as well, although inconsistent.  Do not favor overall  decreased fluid status based on patient appearance.  Patient is unable to tolerated de-escalation of oxygen therapy at this time, will consult hospitalist for admission for COPD exacerbation and acute hypoxic respiratory failure. Patient is flu positive, we will begin tamaflu 30mg  bid (renal dosing for 30-60 GFR). Will consult for hospital admission at this time. Hospitalist agrees to admit at this time. Final Clinical Impression(s) / ED Diagnoses Final diagnoses:  COPD exacerbation [facility])  Hypoxia  Flu    Rx / DC Orders ED Discharge Orders     None        Anselmo Pickler, PA-C 08/16/21 1856    Dorien Chihuahua 08/16/21 1856    Daleen Bo, MD 08/16/21 2046

## 2021-08-16 NOTE — ED Notes (Signed)
Patient is being discharged from the Urgent Care and sent to the Emergency Department via EMS . Per Phillips Odor NP, patient is in need of higher level of care due to hypoxia. Patient is aware and verbalizes understanding of plan of care. Left with EMS at this time. Vitals:   08/16/21 1029  BP: 136/85  Pulse: (!) 104  Temp: (!) 103 F (39.4 C)  SpO2: (!) 80%

## 2021-08-16 NOTE — ED Notes (Signed)
Patient started on 5 liters of O2 per Phillips Odor, NP.

## 2021-08-16 NOTE — ED Provider Notes (Signed)
[facility]    CSN: [id number] Arrival date & time: 08/16/21  0957      History   Chief Complaint Chief Complaint  Patient presents with   Shortness of Breath    HPI Krystal Allen is a 65 y.o. female.   Patient presents with shortness of breath, nasal congestion, cough that have been present since yesterday.  She reports that shortness of breath is severe.  Denies any known fevers at home.  Grandchild had similar symptoms recently.  Patient does endorse history of COPD.  Denies any chest pain.   Shortness of Breath  Past Medical History:  Diagnosis Date   Allergy    COPD (chronic obstructive pulmonary disease) (HCC)    Depression    GERD (gastroesophageal reflux disease)    Hernia, abdominal    History of chicken pox    Hyperlipidemia    Hypertension    Internal hemorrhoids with Grade 3 prolapse and bleeding 06/19/2013   Osteoarthritis    Personal history of colonic adenomas 06/25/2013    Patient Active Problem List   Diagnosis Date Noted   Influenza vaccine needed 09/14/2020   Tobacco dependence 05/12/2020   Aortic atherosclerosis ([place]) 05/12/2020   Adenomatous polyp of colon 05/12/2020   ILD (interstitial lung disease) ([place]) 01/26/2018   Cigarette smoker 01/26/2018   Osteoarthritis of cervical spine 12/22/2017   Prediabetes 12/22/2017   Pain of left calf 04/06/2017   Centrilobular emphysema ([place]) 02/23/2016   Obesity (BMI 30-39.9) 01/15/2016   Bilateral shoulder pain 01/15/2016   Esophageal reflux 01/15/2016   Tobacco abuse 05/13/2015   Lung nodule 08/14/2014   Impaired fasting glucose 08/14/2014   Hyperlipidemia 08/14/2014   Personal history of colonic adenomas 06/25/2013   HTN (hypertension) 05/20/2013    Past Surgical History:  Procedure Laterality Date   ABDOMINAL HYSTERECTOMY  60yrs ago   due to heavy bleeding and fibroids    COLONOSCOPY     HEMORRHOID BANDING  2014   HEMORRHOID SURGERY  [id number] agao   UMBILICAL HERNIA REPAIR   06/2015    OB History   No obstetric history on file.      Home Medications    Prior to Admission medications   Medication Sig Start Date End Date Taking? Authorizing Provider  acetaminophen (TYLENOL 8 HOUR) 650 MG CR tablet Take 1 tablet (650 mg total) by mouth 2 (two) times daily as needed for pain. 04/06/17   Boykin Nearing, MD  albuterol (PROAIR HFA) 108 (90 Base) MCG/ACT inhaler Inhale 2 puffs into the lungs every 4 (four) hours as needed for wheezing or shortness of breath. 05/21/21   Ladell Pier, MD  albuterol (PROVENTIL) (2.5 MG/3ML) 0.083% nebulizer solution TAKE 3 MLS BY NEBULIZATION EVERY 6 (SIX) HOURS AS NEEDED FOR WHEEZING OR SHORTNESS OF BREATH. 03/05/20   Freeman Caldron M, PA-C  amLODipine (NORVASC) 10 MG tablet TAKE 1 TABLET (10 MG TOTAL) BY MOUTH DAILY. MUST HAVE OFFICE VISIT FOR REFILLS 08/11/21 08/11/22  Ladell Pier, MD  aspirin EC 81 MG tablet Take 1 tablet (81 mg total) by mouth daily. 05/12/20   Ladell Pier, MD  atorvastatin (LIPITOR) 20 MG tablet TAKE 1 TABLET (20 MG TOTAL) BY MOUTH DAILY. 01/28/21 01/28/22  Ladell Pier, MD  benzonatate (TESSALON) 100 MG capsule Take 1 capsule (100 mg total) by mouth 2 (two) times daily as needed for cough. 05/21/21   Charlott Rakes, MD  busPIRone (BUSPAR) 5 MG tablet Take 1 tablet (5 mg total) by  mouth 2 (two) times daily. 07/07/20   Ladell Pier, MD  cetirizine (ZYRTEC) 10 MG tablet Take 1 tablet (10 mg total) by mouth daily. 06/16/20   Charlott Rakes, MD  fluticasone (FLONASE) 50 MCG/ACT nasal spray Place 2 sprays into both nostrils daily. 10/17/18   Argentina Donovan, PA-C  fluticasone-salmeterol (ADVAIR) 250-50 MCG/ACT AEPB Inhale into the lungs. 01/22/21 01/22/22  Ladell Pier, MD  guaiFENesin-codeine 100-10 MG/5ML syrup Take 5 mLs by mouth 3 (three) times daily as needed for cough. 03/05/20   Argentina Donovan, PA-C  hydrochlorothiazide (HYDRODIURIL) 12.5 MG tablet Take 1 tablet (12.5 mg total) by  mouth daily. 01/22/21   Ladell Pier, MD  hydrochlorothiazide (MICROZIDE) 12.5 MG capsule TAKE 1 CAPSULE (12.5 MG TOTAL) BY MOUTH DAILY. 09/14/20 09/14/21  Ladell Pier, MD  hydrochlorothiazide (MICROZIDE) 12.5 MG capsule TAKE 1 TABLET (12.5 MG TOTAL) BY MOUTH DAILY. 06/16/20 06/16/21  Charlott Rakes, MD  hydrochlorothiazide (MICROZIDE) 12.5 MG capsule TAKE 1 TABLET (12.5 MG TOTAL) BY MOUTH DAILY. 08/11/21 08/11/22  Ladell Pier, MD  hydrocortisone-pramoxine (PROCTOFOAM [facility]) rectal foam Place 1 applicator rectally 2 (two) times daily. Apply externally twice daily for 10 days. 07/14/20   Jackquline Denmark, MD  nicotine polacrilex (NICORETTE) 2 MG gum 2 1 TO 2 PIECES AS NEEDED TO DECREASE CRAVINGS. MAX 30 PIECES/DAY 01/22/21 01/22/22  Ladell Pier, MD  omeprazole (PRILOSEC) 20 MG capsule Take 1 capsule (20 mg total) by mouth daily. 02/10/20   Camillia Herter, NP  oxybutynin (DITROPAN) 5 MG tablet Take 1 tablet (5 mg total) by mouth 2 (two) times daily. 06/16/20   Charlott Rakes, MD  predniSONE (DELTASONE) 20 MG tablet Take 1 tablet (20 mg total) by mouth daily with breakfast. 05/21/21   Charlott Rakes, MD  Respiratory Therapy Supplies (FLUTTER) DEVI Use as directed 12/01/17   Marshell Garfinkel, MD    Family History Family History  Problem Relation Age of Onset   Cancer Father        prostate   Hypertension Mother    Diabetes Mother    Hypertension Sister    Heart disease Maternal Uncle    Stroke Maternal Grandmother    Hypertension Maternal Grandmother    Hypertension Sister    Colon cancer Neg Hx    Stomach cancer Neg Hx    Rectal cancer Neg Hx    Colon polyps Neg Hx     Social History Social History   Tobacco Use   Smoking status: Every Day    Packs/day: 0.25    Types: E-cigarettes, Cigarettes   Smokeless tobacco: Never   Tobacco comments:    06/30/20 has cut down on cigarettes using gum and occasional vaping  Vaping Use   Vaping Use: Every day  Substance Use Topics    Alcohol use: Yes    Comment: occasional   Drug use: No     Allergies   Sulfa antibiotics, Chantix [varenicline tartrate], Fish allergy, Penicillins, and Latex   Review of Systems Review of Systems Per HPI  Physical Exam Triage Vital Signs ED Triage Vitals  Enc Vitals Group     BP 08/16/21 1029 136/85     Pulse Rate 08/16/21 1029 (!) 104     Resp --      Temp 08/16/21 1029 (!) 103 F (39.4 C)     Temp Source 08/16/21 1029 Oral     SpO2 08/16/21 1029 (!) 80 %     Weight --  Height --      Head Circumference --      Peak Flow --      Pain Score 08/16/21 1031 0     Pain Loc --      Pain Edu? --      Excl. in [place]? --    No data found.  Updated Vital Signs BP 136/85 (BP Location: Left Arm)   Pulse (!) 104   Temp (!) 103 F (39.4 C) (Oral)   SpO2 (!) 80%   Visual Acuity Right Eye Distance:   Left Eye Distance:   Bilateral Distance:    Right Eye Near:   Left Eye Near:    Bilateral Near:     Physical Exam Constitutional:      General: She is in acute distress.     Appearance: Normal appearance. She is ill-appearing. She is not toxic-appearing or diaphoretic.  HENT:     Head: Normocephalic and atraumatic.     Right Ear: Tympanic membrane and ear canal normal.     Left Ear: Tympanic membrane and ear canal normal.     Nose: Congestion present.     Mouth/Throat:     Mouth: Mucous membranes are moist.     Pharynx: No posterior oropharyngeal erythema.  Eyes:     Extraocular Movements: Extraocular movements intact.     Conjunctiva/sclera: Conjunctivae normal.     Pupils: Pupils are equal, round, and reactive to light.  Cardiovascular:     Rate and Rhythm: Normal rate and regular rhythm.     Pulses: Normal pulses.     Heart sounds: Normal heart sounds.  Pulmonary:     Effort: Respiratory distress present.     Breath sounds: Wheezing and rhonchi present.  Abdominal:     General: Abdomen is flat. Bowel sounds are normal.     Palpations: Abdomen is soft.   Musculoskeletal:        General: Normal range of motion.     Cervical back: Normal range of motion.  Skin:    General: Skin is warm and dry.  Neurological:     General: No focal deficit present.     Mental Status: She is alert and oriented to person, place, and time. Mental status is at baseline.  Psychiatric:        Mood and Affect: Mood normal.        Behavior: Behavior normal.     UC Treatments / Results  Labs (all labs ordered are listed, but only abnormal results are displayed) Labs Reviewed - No data to display  EKG   Radiology No results found.  Procedures Procedures (including critical care time)  Medications Ordered in UC Medications  acetaminophen (TYLENOL) tablet 650 mg (650 mg Oral Given 08/16/21 1033)    Initial Impression / Assessment and Plan / UC Course  I have reviewed the triage vital signs and the nursing notes.  Pertinent labs & imaging results that were available during my care of the patient were reviewed by me and considered in my medical decision making (see chart for details).     Patient oxygen was 80% on initial triage.  5 L of oxygen was placed on patient with recovery to 90%.  Unable to give nebulizer treatments in urgent care due to policy.  Patient was sent to the hospital via EMS for further evaluation and management.  Tylenol administered in urgent care due to high fever. Final Clinical Impressions(s) / UC Diagnoses   Final diagnoses:  Respiratory distress  Shortness of breath  Fever, unspecified     Discharge Instructions      Patient was sent to the hospital via EMS.     ED Prescriptions   None    PDMP not reviewed this encounter.   Odis Luster, FNP 08/16/21 1102

## 2021-08-17 DIAGNOSIS — R509 Fever, unspecified: Secondary | ICD-10-CM | POA: Diagnosis not present

## 2021-08-17 DIAGNOSIS — R7303 Prediabetes: Secondary | ICD-10-CM | POA: Diagnosis present

## 2021-08-17 DIAGNOSIS — J101 Influenza due to other identified influenza virus with other respiratory manifestations: Secondary | ICD-10-CM | POA: Diagnosis present

## 2021-08-17 DIAGNOSIS — E785 Hyperlipidemia, unspecified: Secondary | ICD-10-CM | POA: Diagnosis present

## 2021-08-17 DIAGNOSIS — E876 Hypokalemia: Secondary | ICD-10-CM | POA: Diagnosis present

## 2021-08-17 DIAGNOSIS — Z8249 Family history of ischemic heart disease and other diseases of the circulatory system: Secondary | ICD-10-CM | POA: Diagnosis not present

## 2021-08-17 DIAGNOSIS — N189 Chronic kidney disease, unspecified: Secondary | ICD-10-CM | POA: Diagnosis present

## 2021-08-17 DIAGNOSIS — N393 Stress incontinence (female) (male): Secondary | ICD-10-CM | POA: Diagnosis present

## 2021-08-17 DIAGNOSIS — J111 Influenza due to unidentified influenza virus with other respiratory manifestations: Secondary | ICD-10-CM | POA: Diagnosis present

## 2021-08-17 DIAGNOSIS — J432 Centrilobular emphysema: Secondary | ICD-10-CM | POA: Diagnosis present

## 2021-08-17 DIAGNOSIS — I129 Hypertensive chronic kidney disease with stage 1 through stage 4 chronic kidney disease, or unspecified chronic kidney disease: Secondary | ICD-10-CM | POA: Diagnosis present

## 2021-08-17 DIAGNOSIS — Z20822 Contact with and (suspected) exposure to covid-19: Secondary | ICD-10-CM | POA: Diagnosis present

## 2021-08-17 DIAGNOSIS — Z8042 Family history of malignant neoplasm of prostate: Secondary | ICD-10-CM | POA: Diagnosis not present

## 2021-08-17 DIAGNOSIS — F17210 Nicotine dependence, cigarettes, uncomplicated: Secondary | ICD-10-CM | POA: Diagnosis present

## 2021-08-17 DIAGNOSIS — Z823 Family history of stroke: Secondary | ICD-10-CM | POA: Diagnosis not present

## 2021-08-17 DIAGNOSIS — R040 Epistaxis: Secondary | ICD-10-CM | POA: Diagnosis not present

## 2021-08-17 DIAGNOSIS — J9601 Acute respiratory failure with hypoxia: Secondary | ICD-10-CM | POA: Diagnosis present

## 2021-08-17 DIAGNOSIS — Z90710 Acquired absence of both cervix and uterus: Secondary | ICD-10-CM | POA: Diagnosis not present

## 2021-08-17 DIAGNOSIS — Z833 Family history of diabetes mellitus: Secondary | ICD-10-CM | POA: Diagnosis not present

## 2021-08-17 DIAGNOSIS — J849 Interstitial pulmonary disease, unspecified: Secondary | ICD-10-CM | POA: Diagnosis present

## 2021-08-17 DIAGNOSIS — R059 Cough, unspecified: Secondary | ICD-10-CM | POA: Diagnosis not present

## 2021-08-17 DIAGNOSIS — E871 Hypo-osmolality and hyponatremia: Secondary | ICD-10-CM | POA: Diagnosis present

## 2021-08-17 DIAGNOSIS — N179 Acute kidney failure, unspecified: Secondary | ICD-10-CM | POA: Diagnosis present

## 2021-08-17 DIAGNOSIS — R0602 Shortness of breath: Secondary | ICD-10-CM | POA: Diagnosis not present

## 2021-08-17 LAB — BASIC METABOLIC PANEL
Anion gap: 10 (ref 5–15)
BUN: 18 mg/dL (ref 8–23)
CO2: 23 mmol/L (ref 22–32)
Calcium: 8.4 mg/dL — ABNORMAL LOW (ref 8.9–10.3)
Chloride: 101 mmol/L (ref 98–111)
Creatinine, Ser: 1.37 mg/dL — ABNORMAL HIGH (ref 0.44–1.00)
GFR, Estimated: 43 mL/min — ABNORMAL LOW (ref >60)
Glucose, Bld: 135 mg/dL — ABNORMAL HIGH (ref 70–99)
Potassium: 3.5 mmol/L (ref 3.5–5.1)
Sodium: 134 mmol/L — ABNORMAL LOW (ref 135–145)

## 2021-08-17 LAB — LIPID PANEL
Cholesterol: 142 mg/dL (ref 0–200)
HDL: 38 mg/dL — ABNORMAL LOW (ref >40)
LDL Cholesterol: 92 mg/dL (ref 0–99)
Total CHOL/HDL Ratio: 3.7 RATIO
Triglycerides: 62 mg/dL (ref <150)
VLDL: 12 mg/dL (ref 0–40)

## 2021-08-17 LAB — HEMOGLOBIN A1C
Hgb A1c MFr Bld: 6.1 % — ABNORMAL HIGH (ref 4.8–5.6)
Mean Plasma Glucose: 128.37 mg/dL

## 2021-08-17 LAB — HIV ANTIBODY (ROUTINE TESTING W REFLEX): HIV Screen 4th Generation wRfx: NONREACTIVE

## 2021-08-17 MED ORDER — PREDNISONE 20 MG PO TABS
40.0000 mg | ORAL_TABLET | Freq: Every day | ORAL | Status: AC
  Administered 2021-08-17: 40 mg via ORAL
  Filled 2021-08-17: qty 2

## 2021-08-17 MED ORDER — GUAIFENESIN 100 MG/5ML PO LIQD
5.0000 mL | ORAL | Status: DC | PRN
  Administered 2021-08-17 – 2021-08-20 (×11): 5 mL via ORAL
  Filled 2021-08-17 (×11): qty 5

## 2021-08-17 MED ORDER — POTASSIUM CHLORIDE 20 MEQ PO PACK: 40.0000 meq | PACK | Freq: Once | ORAL | Status: DC

## 2021-08-17 MED ORDER — ATORVASTATIN CALCIUM 10 MG PO TABS
20.0000 mg | ORAL_TABLET | Freq: Every day | ORAL | Status: DC
  Administered 2021-08-17 – 2021-08-20 (×4): 20 mg via ORAL
  Filled 2021-08-17 (×4): qty 2

## 2021-08-17 MED ORDER — LACTATED RINGERS IV BOLUS
1000.0000 mL | Freq: Once | INTRAVENOUS | Status: AC
  Administered 2021-08-17: 1000 mL via INTRAVENOUS

## 2021-08-17 NOTE — Plan of Care (Signed)

## 2021-08-17 NOTE — Progress Notes (Addendum)
   Krystal Allen is a 65 yo female with COPD not on supplemental O2, HTN, and HLD who presents to [facility] with 2-3 days of shortness of breath, nasal congestion, productive cough of yellow sputum, body aches, fever, and chills. She was recently exposed to a family member who tested positive for the flu on hospital day 0. Subjective:   Patient endorsed feeling better and stated the shortness of breath was still bothering her. She stated the leakage of urine while coughing to be a big issue for her and has occurred in the past with heavy coughing.   Objective: Vital signs in last 24 hours: Vitals:   08/17/21 1110 08/17/21 1200 08/17/21 1300 08/17/21 1400  BP: 126/87 (!) 146/90 (!) 144/80 (!) 141/89  Pulse: 77 79 73 87  Resp: 18 (!) 28 20 18   Temp:      TempSrc:      SpO2: 96% 94% 94% 94%  Weight:      Height:       Physical Exam General: NAD Head: Normocephalic without scalp lesions.  Eyes: Conjunctivae pink, sclerae white, without jaundice.  Mouth: Lips normal color, without lesions. Dry mucus membrane. Neck: Neck supple with full range of motion (ROM). Lungs: CTAB, no wheeze, rhonchi or rales. Decreased air movement in the bases.  Cardiovascular: Normal heart sounds, no r/m/g Abdomen: No TTP, normal bowel sounds MSK: No asymmetry or muscle atrophy.  Skin: warm, dry, good skin turgor, no lesions Neuro: Alert and oriented. CN grossly intact Psych: Normal mood and normal affect  Assessment/Plan: Krystal Allen is a 65 yo female with COPD not on supplemental O2, HTN, and HLD who presents to [facility] with 2-3 days of shortness of breath, nasal congestion, productive cough of yellow sputum, body aches, fever, and chills. She was recently exposed to a family member who tested positive for the flu on hospital day 0.  Principal Problem:   Influenza A Active Problems:   Tobacco abuse   Centrilobular emphysema (HCC)  Shortness of breath Cough Influenza  Patient presented  with 2 days of shortness of breath, cough, nasal congestion, and fever. Exposed to family member who tested positive for the flu and the patient is now positive for influenza A. Received 1 dose of Tamiflu and 60 mg of prednisone.  - Maintain SpO2 >90%, supplemental O2 as needed - Continue Tamiflu 30 mg BID (renal dosing given GFR 36) and prednisone 40 mg daily - Breo Ellipta 1 puff daily and Duonebs q4h scheduled. - Blood cultures pending; NGTD - PT/OT recs pending, may require supplemental O2 on discharge -Patient desaturated to 85% while on room air and became tachypneic with RR at 35.  Hypokalemia K 3.1 on admission. - 40 mEq Kclor packet given    Prediabetes Patient had A1c of 6.1. She is on SSI. With prednisone use, I expect sugars to be elevated.  -Continue to monitor  Stress incontinence Patient notes that when she coughs, she is incontinent of her urine. She endorses prior history of this as well.   DVT prophx: Lovenox 40 mg Diet: Carb modified Bowel: PRN Code:Full  Prior to Admission Living Arrangement: Home Anticipated Discharge Location: Home Barriers to Discharge: Medical management Dispo: Anticipated discharge in approximately 1-2 day(s).   Idamae Schuller, MD Tillie Rung. [facility] Internal Medicine Residency, PGY-1  Pager: [phone]

## 2021-08-17 NOTE — Progress Notes (Addendum)
Patient transferred to 2 W. with daughter at bedside.  Patient and daughter questions about restarting home medications.  Patient was saturating well on 3 L however daughter was concerned as when patient started having coughing spells the oxygen saturation did not drop.  Reassessed her fingernails and noted.  She has clear nail polish on.  Requested that nursing staff place a ear pulse ox monitor to have more accurate readings.  Discussed with patient and daughter that we will be restarting some home medications not others.  Patient's blood pressure has been stable so we have not restarted this medication just yet.  Also added in Robitussin for the patient and requested nurses place additional nicotine patches patient lost her first in the ED.

## 2021-08-17 NOTE — ED Notes (Signed)
Breakfast Orders placed 

## 2021-08-17 NOTE — ED Notes (Addendum)
Room air O2 drops to 85-86% then pt becomes tachypneic up to 35 cpm. Pt ambulated to the bathroom with O2. Pt was so short of breath coming back and had to take pauses when finishing sentences. Will continue to monitor.

## 2021-08-17 NOTE — ED Notes (Signed)
Pt complaining of mild headache. RN to give PRN tylenol

## 2021-08-17 NOTE — Evaluation (Signed)
Physical Therapy Evaluation Patient Details Name: Krystal Allen MRN: [id number] DOB: [birth year] Today's Date: 08/17/2021  History of Present Illness  Pt is 65 yo female admitted with shortness of breath, nasal congestion, fever and found to be + for influenze A on 08/16/21.  Pt with hx of COPD not on supplemental O2, HTN, and HLD  Clinical Impression  Pt admitted with above diagnosis. At baseline, pt is very independent and not on home O2.  Today, pt transferring independently but greatly fatigued and with DOE of 4/4 requiring 4 L O2 to ambulate 250' with 3 rest breaks.  She had mild waivering in gait pattern.  Will maintain on PT caseload due to significant shortness of breath and decreased mobility compared to baseline.  Also, recommend ambulation with nursing. Expected to progress well.  Pt currently with functional limitations due to the deficits listed below (see PT Problem List). Pt will benefit from skilled PT to increase their independence and safety with mobility to allow discharge to the venue listed below.          Recommendations for follow up therapy are one component of a multi-disciplinary discharge planning process, led by the attending physician.  Recommendations may be updated based on patient status, additional functional criteria and insurance authorization.  Follow Up Recommendations No PT follow up;Supervision - Intermittent    Equipment Recommendations  Other (comment) (possible home O2)    Recommendations for Other Services       Precautions / Restrictions Precautions Precautions: None      Mobility  Bed Mobility Overal bed mobility: Independent                  Transfers Overall transfer level: Independent   Transfers: Sit to/from Stand Sit to Stand: Independent            Ambulation/Gait Ambulation/Gait assistance: Supervision Gait Distance (Feet): 250 Feet Assistive device: None Gait Pattern/deviations: Step-through pattern Gait  velocity: decreased   General Gait Details: Required 3 standing rest breaks and was greatly fatigued with DOE of 4/4 in the last 30'.  Mild waivering gait pattern  Stairs            Wheelchair Mobility    Modified Rankin (Stroke Patients Only)       Balance Overall balance assessment: Needs assistance Sitting-balance support: No upper extremity supported;Feet unsupported Sitting balance-Leahy Scale: Normal     Standing balance support: No upper extremity supported Standing balance-Leahy Scale: Good                               Pertinent Vitals/Pain Pain Assessment: 0-10 Pain Score: 2  Pain Location: low back Pain Descriptors / Indicators: Aching Pain Intervention(s): Limited activity within patient's tolerance;Monitored during session    Home Living Family/patient expects to be discharged to:: Private residence Living Arrangements: Children Available Help at Discharge: Family;Available PRN/intermittently (son works first shift; but she can have other members assist) Type of Home: Apartment Home Access: Stairs to enter Entrance Stairs-Rails: None Entrance Stairs-Number of Steps: 3 Home Layout: One level Home Equipment: None      Prior Function Level of Independence: Independent         Comments: Independent with all ADLs, IADLs, driving     Hand Dominance        Extremity/Trunk Assessment   Upper Extremity Assessment Upper Extremity Assessment: Overall WFL for tasks assessed    Lower Extremity Assessment Lower Extremity Assessment:  Overall [facility] for tasks assessed    Cervical / Trunk Assessment Cervical / Trunk Assessment: Normal  Communication   Communication: No difficulties  Cognition Arousal/Alertness: Awake/alert Behavior During Therapy: WFL for tasks assessed/performed Overall Cognitive Status: Within Functional Limits for tasks assessed                                        General Comments General  comments (skin integrity, edema, etc.): Pt on 3.5 L O2 at arrival with sats 93% (RN reports pt just washed up and got O2 back on was fatigued).  She was given time to recover then ambulated on 4 L with sats 95% but DOE of 4/4 at end of gait.  Able to decrease back to 3 L after resting with sats 94%. Discussed would maintain on PT caseload but encouraged ambulation with nursing    Exercises     Assessment/Plan    PT Assessment Patient needs continued PT services  PT Problem List Decreased mobility;Decreased activity tolerance;Cardiopulmonary status limiting activity;Decreased balance       PT Treatment Interventions DME instruction;Therapeutic exercise;Gait training;Balance training;Stair training;Functional mobility training;Therapeutic activities;Patient/family education    PT Goals (Current goals can be found in the Care Plan section)  Acute Rehab PT Goals Patient Stated Goal: feel better; go home PT Goal Formulation: With patient Time For Goal Achievement: 08/31/21 Potential to Achieve Goals: Good    Frequency Min 3X/week   Barriers to discharge        Co-evaluation               AM-PAC PT "6 Clicks" Mobility  Outcome Measure Help needed turning from your back to your side while in a flat bed without using bedrails?: None Help needed moving from lying on your back to sitting on the side of a flat bed without using bedrails?: None Help needed moving to and from a bed to a chair (including a wheelchair)?: None Help needed standing up from a chair using your arms (e.g., wheelchair or bedside chair)?: None Help needed to walk in hospital room?: A Little Help needed climbing 3-5 steps with a railing? : A Little 6 Click Score: 22    End of Session Equipment Utilized During Treatment: Gait belt Activity Tolerance: Patient tolerated treatment well Patient left: in bed;with call bell/phone within reach Nurse Communication: Mobility status PT Visit Diagnosis: Other  abnormalities of gait and mobility (R26.89)    Time: 1010-1027 PT Time Calculation (min) (ACUTE ONLY): 17 min   Charges:   PT Evaluation $PT Eval Low Complexity: 1 Low          Tavon Magnussen, PT Acute Rehab Services Pager [phone] [facility] [phone]   Karlton Lemon 08/17/2021, 11:15 AM

## 2021-08-18 DIAGNOSIS — J101 Influenza due to other identified influenza virus with other respiratory manifestations: Secondary | ICD-10-CM | POA: Diagnosis not present

## 2021-08-18 LAB — CBC
HCT: 39.9 % (ref 36.0–46.0)
Hemoglobin: 13.2 g/dL (ref 12.0–15.0)
MCH: 29.3 pg (ref 26.0–34.0)
MCHC: 33.1 g/dL (ref 30.0–36.0)
MCV: 88.5 fL (ref 80.0–100.0)
Platelets: 216 10*3/uL (ref 150–400)
RBC: 4.51 MIL/uL (ref 3.87–5.11)
RDW: 18.1 % — ABNORMAL HIGH (ref 11.5–15.5)
WBC: 6.8 10*3/uL (ref 4.0–10.5)
nRBC: 0 % (ref 0.0–0.2)

## 2021-08-18 LAB — BASIC METABOLIC PANEL
Anion gap: 7 (ref 5–15)
Anion gap: 8 (ref 5–15)
BUN: 16 mg/dL (ref 8–23)
BUN: 15 mg/dL (ref 8–23)
CO2: 23 mmol/L (ref 22–32)
CO2: 23 mmol/L (ref 22–32)
Calcium: 8 mg/dL — ABNORMAL LOW (ref 8.9–10.3)
Calcium: 8.3 mg/dL — ABNORMAL LOW (ref 8.9–10.3)
Chloride: 101 mmol/L (ref 98–111)
Chloride: 103 mmol/L (ref 98–111)
Creatinine, Ser: 1.15 mg/dL — ABNORMAL HIGH (ref 0.44–1.00)
Creatinine, Ser: 1.15 mg/dL — ABNORMAL HIGH (ref 0.44–1.00)
GFR, Estimated: 53 mL/min — ABNORMAL LOW (ref >60)
GFR, Estimated: 53 mL/min — ABNORMAL LOW (ref >60)
Glucose, Bld: 115 mg/dL — ABNORMAL HIGH (ref 70–99)
Glucose, Bld: 139 mg/dL — ABNORMAL HIGH (ref 70–99)
Potassium: 3.2 mmol/L — ABNORMAL LOW (ref 3.5–5.1)
Potassium: 4.2 mmol/L (ref 3.5–5.1)
Sodium: 131 mmol/L — ABNORMAL LOW (ref 135–145)
Sodium: 134 mmol/L — ABNORMAL LOW (ref 135–145)

## 2021-08-18 LAB — MAGNESIUM: Magnesium: 2.2 mg/dL (ref 1.7–2.4)

## 2021-08-18 MED ORDER — WITCH HAZEL-GLYCERIN EX PADS
MEDICATED_PAD | CUTANEOUS | Status: DC | PRN
  Filled 2021-08-18 (×2): qty 100

## 2021-08-18 MED ORDER — POTASSIUM CHLORIDE 20 MEQ PO PACK
40.0000 meq | PACK | Freq: Once | ORAL | Status: AC
  Administered 2021-08-18: 40 meq via ORAL
  Filled 2021-08-18: qty 2

## 2021-08-18 MED ORDER — SALINE SPRAY 0.65 % NA SOLN
2.0000 | NASAL | Status: DC
  Administered 2021-08-18 – 2021-08-20 (×13): 2 via NASAL
  Filled 2021-08-18: qty 44

## 2021-08-18 MED ORDER — PREDNISONE 20 MG PO TABS
40.0000 mg | ORAL_TABLET | Freq: Every day | ORAL | Status: AC
  Administered 2021-08-19 – 2021-08-20 (×2): 40 mg via ORAL
  Filled 2021-08-18 (×2): qty 2

## 2021-08-18 MED ORDER — PREDNISONE 20 MG PO TABS: 40.0000 mg | ORAL_TABLET | Freq: Every day | ORAL | Status: DC

## 2021-08-18 MED ORDER — IPRATROPIUM-ALBUTEROL 0.5-2.5 (3) MG/3ML IN SOLN: 3.0000 mL | Freq: Four times a day (QID) | RESPIRATORY_TRACT | Status: DC | PRN

## 2021-08-18 MED ORDER — IPRATROPIUM-ALBUTEROL 0.5-2.5 (3) MG/3ML IN SOLN
3.0000 mL | Freq: Three times a day (TID) | RESPIRATORY_TRACT | Status: DC
  Administered 2021-08-18: 3 mL via RESPIRATORY_TRACT
  Filled 2021-08-18: qty 3

## 2021-08-18 MED ORDER — OSELTAMIVIR PHOSPHATE 75 MG PO CAPS
75.0000 mg | ORAL_CAPSULE | Freq: Two times a day (BID) | ORAL | Status: DC
  Administered 2021-08-18 – 2021-08-20 (×5): 75 mg via ORAL
  Filled 2021-08-18 (×6): qty 1

## 2021-08-18 MED ORDER — PREDNISONE 20 MG PO TABS
40.0000 mg | ORAL_TABLET | Freq: Every day | ORAL | Status: AC
  Administered 2021-08-18: 40 mg via ORAL
  Filled 2021-08-18: qty 2

## 2021-08-18 MED ORDER — IPRATROPIUM-ALBUTEROL 0.5-2.5 (3) MG/3ML IN SOLN
3.0000 mL | Freq: Four times a day (QID) | RESPIRATORY_TRACT | Status: DC
  Administered 2021-08-18 (×2): 3 mL via RESPIRATORY_TRACT
  Filled 2021-08-18 (×2): qty 3

## 2021-08-18 NOTE — Plan of Care (Signed)

## 2021-08-18 NOTE — Progress Notes (Addendum)
Krystal Allen is a 65 yo female with COPD not on supplemental O2, HTN, and HLD who presents to [facility] with 2-3 days of shortness of breath, nasal congestion, productive cough of yellow sputum, body aches, fever, and chills. She was recently exposed to a family member who tested positive for the flu on hospital day 1.  Subjective:  Overnight: NAEON  Patient stated her cough improved. She was getting dryness in her nostrils. Overall, she was feeling better but stated the progress was slow.   Objective: Vital signs in last 24 hours: Vitals:   08/17/21 1603 08/17/21 2041 08/17/21 2318 08/18/21 0030  BP: (!) 136/94 127/73    Pulse: 80 83    Resp: 20     Temp: 98.2 F (36.8 C)   98.6 F (37 C)  TempSrc:    Oral  SpO2: 93% 92% 95%   Weight:      Height:        Physical Exam General: NAD Head: Normocephalic without scalp lesions.  Eyes: Conjunctivae pink, sclerae white, without jaundice.  Mouth: Lips normal color, without lesions. Moist mucus membrane. Neck: Neck supple with full range of motion (ROM).  Lungs: CTAB, no wheeze, or rales. Rhonchi present on anterior auscultation. Cardiovascular: Normal heart sounds, no r/m/g, 2+ pulses in all extremities Abdomen: No TTP MSK: No asymmetry or muscle atrophy. Full range of motion (ROM) of all joints. No injuries noted. No edema Skin: warm, dry, good skin turgor, no lesions Neuro: Alert and oriented. CN grossly intact Psych: Normal mood and normal affect   Assessment/Plan: Krystal Allen is a 65 yo female with COPD not on supplemental O2, HTN, and HLD who presents to [facility] with 2-3 days of shortness of breath, nasal congestion, productive cough of yellow sputum, body aches, fever, and chills. She was recently exposed to a family member who tested positive for the flu on hospital day 1.  Principal Problem:   Influenza A Active Problems:   Tobacco abuse   Centrilobular emphysema (HCC)  Shortness of  breath Cough Influenza  Patient presented with 2 days of shortness of breath, cough, nasal congestion, and fever. Exposed to family member who tested positive for the flu and the patient is now positive for influenza A. Received 1 dose of Tamiflu and 60 mg of prednisone in the ED.  - Maintain SpO2 >90%, supplemental O2 as needed currently on 3L.  - Continue Tamiflu 30 mg BID (renal dosing given GFR 36) Day 3/5 and prednisone 40 mg daily Day 3/5 - Breo Ellipta 1 puff daily and Duonebs q6h scheduled. - Blood cultures pending; NGTD -Patient desaturated to 85% while on room air and became tachypneic with RR at 35 08/17/21. -Saline nasal spray q4hrs to prevent dryness from supplemental oxygen.  Hypokalemia K 3.2 on 08/18/21. Low at admission. - Replete as needed for goal of 4.   Hypertension Chronic issue for patient. Currently well controlled without her home meds and I expect this may be due to poor oral intake. -Hold home BP meds -Continue to monitor  Prediabetes Patient had A1c of 6.1. She is on SSI. With prednisone use, I expect sugars to be elevated. BMP showed sugar of 115 on 08/18/21 am. -Continue to monitor  Stress incontinence Patient notes that when she coughs, she is incontinent of her urine. She endorses prior history of this as well.   DVT prophx: Lovenox 40 mg Diet: Carb modified Bowel: PRN Code:Full  Prior to Admission Living Arrangement: Home  Anticipated Discharge Location: Home Barriers to Discharge: Medical management Dispo: Anticipated discharge in approximately 1-2 day(s).   Idamae Schuller, MD Tillie Rung. [facility] Internal Medicine Residency, PGY-1  Pager: [phone]

## 2021-08-19 ENCOUNTER — Inpatient Hospital Stay (HOSPITAL_COMMUNITY): Payer: Medicare Other

## 2021-08-19 LAB — BASIC METABOLIC PANEL
Anion gap: 7 (ref 5–15)
BUN: 16 mg/dL (ref 8–23)
CO2: 24 mmol/L (ref 22–32)
Calcium: 8.2 mg/dL — ABNORMAL LOW (ref 8.9–10.3)
Chloride: 105 mmol/L (ref 98–111)
Creatinine, Ser: 1.05 mg/dL — ABNORMAL HIGH (ref 0.44–1.00)
GFR, Estimated: 59 mL/min — ABNORMAL LOW (ref >60)
Glucose, Bld: 102 mg/dL — ABNORMAL HIGH (ref 70–99)
Potassium: 4.3 mmol/L (ref 3.5–5.1)
Sodium: 136 mmol/L (ref 135–145)

## 2021-08-19 LAB — CBC WITH DIFFERENTIAL/PLATELET
Abs Immature Granulocytes: 0.02 10*3/uL (ref 0.00–0.07)
Basophils Absolute: 0 10*3/uL (ref 0.0–0.1)
Basophils Relative: 0 %
Eosinophils Absolute: 0 10*3/uL (ref 0.0–0.5)
Eosinophils Relative: 0 %
HCT: 38.1 % (ref 36.0–46.0)
Hemoglobin: 12.7 g/dL (ref 12.0–15.0)
Immature Granulocytes: 0 %
Lymphocytes Relative: 34 %
Lymphs Abs: 2 10*3/uL (ref 0.7–4.0)
MCH: 29.4 pg (ref 26.0–34.0)
MCHC: 33.3 g/dL (ref 30.0–36.0)
MCV: 88.2 fL (ref 80.0–100.0)
Monocytes Absolute: 0.4 10*3/uL (ref 0.1–1.0)
Monocytes Relative: 7 %
Neutro Abs: 3.4 10*3/uL (ref 1.7–7.7)
Neutrophils Relative %: 59 %
Platelets: 226 10*3/uL (ref 150–400)
RBC: 4.32 MIL/uL (ref 3.87–5.11)
RDW: 18.1 % — ABNORMAL HIGH (ref 11.5–15.5)
WBC: 5.9 10*3/uL (ref 4.0–10.5)
nRBC: 0 % (ref 0.0–0.2)

## 2021-08-19 MED ORDER — IPRATROPIUM-ALBUTEROL 0.5-2.5 (3) MG/3ML IN SOLN
3.0000 mL | Freq: Three times a day (TID) | RESPIRATORY_TRACT | Status: DC
  Administered 2021-08-19 – 2021-08-20 (×5): 3 mL via RESPIRATORY_TRACT
  Filled 2021-08-19 (×5): qty 3

## 2021-08-19 MED ORDER — ALBUTEROL SULFATE (2.5 MG/3ML) 0.083% IN NEBU: 2.5000 mg | INHALATION_SOLUTION | RESPIRATORY_TRACT | Status: DC | PRN

## 2021-08-19 MED ORDER — IPRATROPIUM-ALBUTEROL 0.5-2.5 (3) MG/3ML IN SOLN
3.0000 mL | Freq: Two times a day (BID) | RESPIRATORY_TRACT | Status: DC
  Administered 2021-08-19: 3 mL via RESPIRATORY_TRACT
  Filled 2021-08-19: qty 3

## 2021-08-19 NOTE — Progress Notes (Addendum)
Krystal Allen is a 65 yo female with emphysema and smoking-related interstitial lung disease not on supplemental O2, HTN, and HLD who presents to [facility] with 2-3 days of shortness of breath, nasal congestion, productive cough of yellow sputum, body aches, fever, and chills. She was recently exposed to a family member who tested positive for the flu on hospital day 2.  Subjective:  Overnight: NAEON  Patient states she is improving. Still has shortness of breath especially with ambulation but she is on 1 L. She states she is coughing a lot but is bring mucus with it.   Objective: Vital signs in last 24 hours: Vitals:   08/18/21 1625 08/18/21 1943 08/18/21 2132 08/19/21 0519  BP: 121/72  124/75 (!) 108/57  Pulse: 77  77 61  Resp: 17  18 18   Temp: 98.5 F (36.9 C)  97.6 F (36.4 C) 97.9 F (36.6 C)  TempSrc: Oral  Oral Oral  SpO2: 93% 97% 93% 93%  Weight:      Height:       Physical Exam General: NAD Head: Normocephalic without scalp lesions.  Eyes: Conjunctivae pink, sclerae white, without jaundice.  Neck: Neck supple with full range of motion (ROM).  Lungs: diffuse rhonchi heard, improved with coughing. Expiratory wheeze present as well.  Cardiovascular: Normal heart sounds, no r/m/g MSK: No asymmetry or muscle atrophy. Full range of motion (ROM) of all joints. No LE edema Skin: warm, dry, good skin turgor, no lesions Neuro: Alert and oriented. CN grossly intact Psych: Normal mood and normal affect    Assessment/Plan: Krystal Allen is a 65 yo female with emphysema and smoking-related interstitial lung disease not on supplemental O2, HTN, and HLD who presents to [facility] with 2-3 days of shortness of breath, nasal congestion, productive cough of yellow sputum, body aches, fever, and chills. She was recently exposed to a family member who tested positive for the flu on hospital day 3.  Principal Problem:   Influenza A Active Problems:   Tobacco abuse    Centrilobular emphysema (HCC)  Likely COPD exacerbation 2/2 influenza infection Patient presented with 2 days of shortness of breath, cough, nasal congestion, and fever. Exposed to family member who tested positive for the flu and the patient is now positive for influenza A.  - Maintain SpO2 >90%, supplemental O2 as needed currently on 1L.  - Continue Tamiflu BID, increased dosing due to improved renal function; day 4/5 and prednisone 40 mg daily Day 4/5 - Breo Ellipta 1 puff daily and Duonebs scheduled. - Blood cultures pending; NGTD -Saline nasal spray q4hrs to prevent dryness from supplemental oxygen. Patient endorsed improvement with that.  -Will order CXR to rule out development of pneumonia.   Hypokalemia K 3.2 on 08/18/21. Low at admission. K is 4.3 on 10/20. - Replete as needed for goal of 4.   Hypertension Chronic issue for patient. Currently well controlled without her home meds. -Hold home BP meds -Continue to monitor  Prediabetes Patient had A1c of 6.1. She is on SSI. With prednisone use, I expect sugars to be elevated. BMP showed sugar of 102 on 08/19/21 am. -Continue to monitor  Stress incontinence Patient notes that when she coughs, she is incontinent of her urine. She endorses prior history of this as well.   DVT prophx: Lovenox 40 mg Diet: Carb modified Bowel: PRN Code:Full  Prior to Admission Living Arrangement: Home Anticipated Discharge Location: Home Barriers to Discharge: Medical management Dispo: Anticipated discharge in approximately 1-2  day(s).   Idamae Schuller, MD Tillie Rung. [facility] Internal Medicine Residency, PGY-1  Pager: [phone]

## 2021-08-19 NOTE — Progress Notes (Signed)
Physical Therapy Treatment Patient Details Name: Krystal Allen MRN: [id number] DOB: 1956-08-11 Today's Date: 08/19/2021   History of Present Illness Pt is 65 yo female admitted with shortness of breath, nasal congestion, fever and found to be + for influenze A on 08/16/21.  Pt with hx of COPD not on supplemental O2, HTN, and HLD    PT Comments    Pt moving well but limited activity tolerance due to dyspnea. Pt required 2L O2 with amb with SpO2 94% (was 86% on 1L O2). Should be ready for dc home from PT standpoint when medically ready.    Recommendations for follow up therapy are one component of a multi-disciplinary discharge planning process, led by the attending physician.  Recommendations may be updated based on patient status, additional functional criteria and insurance authorization.  Follow Up Recommendations  No PT follow up;Supervision - Intermittent     Equipment Recommendations  Other (comment) (home O2)    Recommendations for Other Services       Precautions / Restrictions Precautions Precautions: None     Mobility  Bed Mobility Overal bed mobility: Independent                  Transfers Overall transfer level: Independent   Transfers: Sit to/from Stand Sit to Stand: Independent            Ambulation/Gait Ambulation/Gait assistance: Modified independent (Device/Increase time);Supervision Gait Distance (Feet): 200 Feet (200' x 1, 90' x 1) Assistive device: None Gait Pattern/deviations: Decreased stride length Gait velocity: decreased Gait velocity interpretation: >2.62 ft/sec, indicative of community ambulatory General Gait Details: 1 standing rest break with 200'. Dyspnea 3/4   Stairs             Wheelchair Mobility    Modified Rankin (Stroke Patients Only)       Balance Overall balance assessment: Mild deficits observed, not formally tested                                          Cognition  Arousal/Alertness: Awake/alert Behavior During Therapy: WFL for tasks assessed/performed Overall Cognitive Status: Within Functional Limits for tasks assessed                                        Exercises      General Comments General comments (skin integrity, edema, etc.): Pt on 1L O2 at rest with SpO2 90%. Amb on 1L with SpO2 86%. Incr O2 to 2L with next amb with SpO2 95%. Returned O2 to 1L at rest      Pertinent Vitals/Pain Pain Assessment: No/denies pain    Home Living                      Prior Function            PT Goals (current goals can now be found in the care plan section) Progress towards PT goals: Progressing toward goals    Frequency    Min 3X/week      PT Plan Current plan remains appropriate    Co-evaluation              AM-PAC PT "6 Clicks" Mobility   Outcome Measure  Help needed turning from your back to your side while in a flat  bed without using bedrails?: None Help needed moving from lying on your back to sitting on the side of a flat bed without using bedrails?: None Help needed moving to and from a bed to a chair (including a wheelchair)?: None Help needed standing up from a chair using your arms (e.g., wheelchair or bedside chair)?: None Help needed to walk in hospital room?: None Help needed climbing 3-5 steps with a railing? : A Little 6 Click Score: 23    End of Session   Activity Tolerance: Patient tolerated treatment well Patient left: in bed;with call bell/phone within reach (sitting EOB) Nurse Communication: Mobility status PT Visit Diagnosis: Other abnormalities of gait and mobility (R26.89)     Time: 6945-0388 PT Time Calculation (min) (ACUTE ONLY): 15 min  Charges:  $Gait Training: 8-22 mins                     [place] Pager [phone] Office [place] 08/19/2021, 4:38 PM

## 2021-08-20 ENCOUNTER — Other Ambulatory Visit (HOSPITAL_COMMUNITY): Payer: Self-pay

## 2021-08-20 LAB — CBC WITH DIFFERENTIAL/PLATELET
Abs Immature Granulocytes: 0.04 10*3/uL (ref 0.00–0.07)
Basophils Absolute: 0 10*3/uL (ref 0.0–0.1)
Basophils Relative: 0 %
Eosinophils Absolute: 0 10*3/uL (ref 0.0–0.5)
Eosinophils Relative: 0 %
HCT: 38.6 % (ref 36.0–46.0)
Hemoglobin: 13.1 g/dL (ref 12.0–15.0)
Immature Granulocytes: 1 %
Lymphocytes Relative: 50 %
Lymphs Abs: 3.6 10*3/uL (ref 0.7–4.0)
MCH: 29.9 pg (ref 26.0–34.0)
MCHC: 33.9 g/dL (ref 30.0–36.0)
MCV: 88.1 fL (ref 80.0–100.0)
Monocytes Absolute: 0.5 10*3/uL (ref 0.1–1.0)
Monocytes Relative: 7 %
Neutro Abs: 3.1 10*3/uL (ref 1.7–7.7)
Neutrophils Relative %: 42 %
Platelets: 234 10*3/uL (ref 150–400)
RBC: 4.38 MIL/uL (ref 3.87–5.11)
RDW: 18.1 % — ABNORMAL HIGH (ref 11.5–15.5)
WBC: 7.2 10*3/uL (ref 4.0–10.5)
nRBC: 0 % (ref 0.0–0.2)

## 2021-08-20 LAB — BASIC METABOLIC PANEL
Anion gap: 5 (ref 5–15)
BUN: 15 mg/dL (ref 8–23)
CO2: 25 mmol/L (ref 22–32)
Calcium: 8.2 mg/dL — ABNORMAL LOW (ref 8.9–10.3)
Chloride: 107 mmol/L (ref 98–111)
Creatinine, Ser: 1 mg/dL (ref 0.44–1.00)
GFR, Estimated: >60 mL/min (ref >60)
Glucose, Bld: 103 mg/dL — ABNORMAL HIGH (ref 70–99)
Potassium: 4.1 mmol/L (ref 3.5–5.1)
Sodium: 137 mmol/L (ref 135–145)

## 2021-08-20 MED ORDER — ALBUTEROL SULFATE (2.5 MG/3ML) 0.083% IN NEBU
INHALATION_SOLUTION | RESPIRATORY_TRACT | 1 refills | Status: DC
  Filled 2021-08-20: qty 90, 8d supply, fill #0
  Filled 2022-06-02: qty 90, 8d supply, fill #1

## 2021-08-20 MED ORDER — MENTHOL 3 MG MT LOZG
1.0000 | LOZENGE | OROMUCOSAL | 0 refills | Status: DC | PRN
  Filled 2021-08-20: qty 27, 30d supply, fill #0

## 2021-08-20 MED ORDER — NICOTINE 14 MG/24HR TD PT24
14.0000 mg | MEDICATED_PATCH | Freq: Every day | TRANSDERMAL | 0 refills | Status: DC
  Filled 2021-08-20: qty 28, 28d supply, fill #0

## 2021-08-20 MED ORDER — SALINE NASAL SPRAY 0.65 % NA SOLN
2.0000 | NASAL | 0 refills | Status: AC
  Filled 2021-08-20: qty 44, 8d supply, fill #0

## 2021-08-20 MED ORDER — INDAPAMIDE 2.5 MG PO TABS
2.5000 mg | ORAL_TABLET | Freq: Once | ORAL | Status: AC
  Administered 2021-08-20: 2.5 mg via ORAL
  Filled 2021-08-20: qty 1

## 2021-08-20 MED ORDER — MENTHOL 3 MG MT LOZG
1.0000 | LOZENGE | OROMUCOSAL | Status: DC | PRN
  Administered 2021-08-20: 3 mg via ORAL
  Filled 2021-08-20: qty 9

## 2021-08-20 NOTE — Care Management (Signed)
RNCM received call from [place] on 2W concerning patient who is discharged needing home oxygen arranged.  RNCM reviewed record for oxygen orders, and noted patient to have oxygen qualifying note embedded in the PT evaluation note. RNCM notified [employer] to arrange an after hours  oxygen set up at  home as well as having a tank delivered to the hospital for transport home. Awaiting oxygen delivery  to hospital. RNCM will continue to follow up.    Laurena Slimmer RN, BSN  ED Care Manager [phone]

## 2021-08-20 NOTE — Progress Notes (Signed)
Krystal Allen is a 65 yo female with emphysema and smoking-related interstitial lung disease not on supplemental O2, HTN, and HLD who presents to [facility] with 2-3 days of shortness of breath, nasal congestion, productive cough of yellow sputum, body aches, fever, and chills. She was recently exposed to a family member who tested positive for the flu on hospital day 3.  Subjective:  Overnight: NAEON  Denied any acute findings. Stated breathing was improved and only thing bothering her was the coughing. She also endorsed a sore throat. Denied feeling SOB unless with exertion, chest pain, muscle pain, fevers or chills.   Objective: Vital signs in last 24 hours: Vitals:   08/19/21 1404 08/19/21 1612 08/19/21 2026 08/20/21 0433  BP: 120/67  130/79 (!) 104/50  Pulse: 65  (!) 58 (!) 59  Resp: 15  18 20   Temp: 98.2 F (36.8 C)  98.3 F (36.8 C) 97.6 F (36.4 C)  TempSrc:   Oral Oral  SpO2: 98% 98% 99% 100%  Weight:      Height:       Physical Exam Physical Exam General: NAD Head: Normocephalic without scalp lesions.  Eyes: Conjunctivae pink, sclerae white, without jaundice.  Mouth: Moist mucus membrane. Neck: Neck supple with full range of motion (ROM). No thyromegaly, no tenderness to palpation.  Lungs: Diffuse rhonchi heard. No wheezes or rhonchi.  Cardiovascular: Normal heart sounds, no r/m/g Abdomen: No TTP, normal bowel sounds MSK: No asymmetry or muscle atrophy. F Neuro: Alert and oriented. CN grossly intact Psych: Normal mood and normal affect   Assessment/Plan: Krystal Allen is a 65 yo female with emphysema and smoking-related interstitial lung disease not on supplemental O2, HTN, and HLD who presents to [facility] with 2-3 days of shortness of breath, nasal congestion, productive cough of yellow sputum, body aches, fever, and chills. She was recently exposed to a family member who tested positive for the flu on hospital day 3.  Principal Problem:   Influenza  A Active Problems:   Tobacco abuse   Centrilobular emphysema (HCC)  Likely COPD exacerbation 2/2 influenza infection Patient presented with 2 days of shortness of breath, cough, nasal congestion, and fever. Exposed to family member who tested positive for the flu and the patient is now positive for influenza A. Repeat CXR shows improvement with no active process.  - Maintain SpO2 >90%, supplemental O2 as needed currently on 1L. When weaned off oxygen she remained >90%. Will trial with ambulation and hopefully discharge today.  - Continue Tamiflu 75 mg BID, day 5/5 and prednisone 40 mg daily Day 5/5 - Breo Ellipta 1 puff daily and Duonebs scheduled BID. - Blood cultures pending; NGTD -Saline nasal spray q4hrs to prevent dryness from supplemental oxygen. Patient endorsed improvement with that.  -Incentive spirometry and cepacol    Hypokalemia K 3.2 on 08/18/21. Low at admission. K is 4.1 on 10/21. - Replete as needed for goal of 4.   Hypertension Chronic issue for patient. Currently well controlled without her home meds. -Hold home BP meds -Continue to monitor  Prediabetes Patient had A1c of 6.1. She is on SSI. With prednisone use, I expect sugars to be elevated. BMP showed sugar of 102 on 08/19/21 am. -Continue to monitor  Stress incontinence Patient notes that when she coughs, she is incontinent of her urine. She endorses prior history of this as well.   DVT prophx: Lovenox 40 mg Diet: Carb modified Bowel: PRN Code:Full  Prior to Admission Living Arrangement: Home  Anticipated Discharge Location: Home Barriers to Discharge: Medical management Dispo: Anticipated discharge in approximately 0 day(s).   Idamae Schuller, MD Tillie Rung. [facility] Internal Medicine Residency, PGY-1  Pager: [phone]

## 2021-08-20 NOTE — Plan of Care (Signed)

## 2021-08-20 NOTE — Progress Notes (Signed)
SATURATION QUALIFICATIONS: (This note is used to comply with regulatory documentation for home oxygen)  Patient Saturations on Room Air at Rest = 89%  Patient Saturations on Room Air while Ambulating = 87%  Patient Saturations on 2 Liters of oxygen while Ambulating = 97%  Please briefly explain why patient needs home oxygen:

## 2021-08-20 NOTE — Discharge Instructions (Signed)
It was a pleasure taking care of you!  You came to the ED with shortness of breath, fever, chills and body aches. You were found to have flu. We started you on prednisone and tamiflu for which you completed 5 days. Your breathing improved and you required less and less oxygen. Today you only required oxygen with movement and none at rest. We believe you can complete your recovery at home. We will discharge you with oxygen and a close PCP follow up. Your PCP wants to see you on 08/24/21 at 4pm. Please keep this appointment and follow their recommendation.   We did not give you any blood pressure medications while you were in the hospital. Please monitor your blood pressure at home and don't restart your medicines until you see your PCP. I recommend you keep a log of your blood pressure readings and take that to your PCP appointment. The reason your blood pressure was lower than normal was due to having a viral infection and not drinking enough water. This may change in the future and you will require restarting your BP medications. Please follow up with your PCP and follow their advise. I will make a note to your PCP to have you follow up with pulmonology.

## 2021-08-21 LAB — CULTURE, BLOOD (ROUTINE X 2)
Culture: NO GROWTH
Culture: NO GROWTH
Special Requests: ADEQUATE
Special Requests: ADEQUATE

## 2021-08-21 NOTE — Care Management (Signed)
Updated Nurse Francee Nodal RN on f/u on delivery of oxygen. [employer] unable to provide an ETA but stated they will deliver the oxygen but they will deliver tonight.

## 2021-08-21 NOTE — Discharge Summary (Signed)
Name: Krystal Allen MRN: [id number] DOB: 1956-02-01 65 y.o. PCP: Ladell Pier, MD  Date of Admission: 08/16/2021 11:30 AM Date of Discharge: 08/21/2021  2:10 AM Attending Physician: Genice Rouge  Discharge Diagnosis: 1. Acute hypoxic respiratory failure 2/2 to Influenza  2. COPD exacerbation with hx of COPD 3. AKI on CKD 4. Hypokalemia 5. Stress incontinence 6. Prediabetes   Discharge Medications: Allergies as of 08/21/2021       Reactions   Sulfa Antibiotics Anaphylaxis, Hives   Chantix [varenicline Tartrate]    Bad dreams   Fish Allergy Other (See Comments)   Severe stomach cramps    Penicillins Other (See Comments)   Has patient had a PCN reaction causing immediate rash, facial/tongue/throat swelling, SOB or lightheadedness with hypotension: No Has patient had a PCN reaction causing severe rash involving mucus membranes or skin necrosis: No Has patient had a PCN reaction that required hospitalization No Has patient had a PCN reaction occurring within the last 10 years: No If all of the above answers are "NO", then may proceed with Cephalosporin use. Welts    Latex Rash, Other (See Comments)   Dry skin        Medication List     STOP taking these medications    amLODipine 10 MG tablet Commonly known as: NORVASC   GNP Nicotine Polacrilex 2 MG gum Generic drug: nicotine polacrilex   hydrochlorothiazide 12.5 MG tablet Commonly known as: HYDRODIURIL   predniSONE 20 MG tablet Commonly known as: DELTASONE   Proctofoam HC rectal foam Generic drug: hydrocortisone-pramoxine       TAKE these medications    acetaminophen 650 MG CR tablet Commonly known as: Tylenol 8 Hour Take 1 tablet (650 mg total) by mouth 2 (two) times daily as needed for pain.   Advair Diskus 250-50 MCG/ACT Aepb Generic drug: fluticasone-salmeterol Inhale into the lungs.   albuterol 108 (90 Base) MCG/ACT inhaler Commonly known as: ProAir HFA Inhale 2 puffs into the  lungs every 4 (four) hours as needed for wheezing or shortness of breath. What changed: Another medication with the same name was changed. Make sure you understand how and when to take each.   albuterol (2.5 MG/3ML) 0.083% nebulizer solution Commonly known as: PROVENTIL TAKE 3 MLS BY NEBULIZATION EVERY 6 (SIX) HOURS AS NEEDED FOR WHEEZING OR SHORTNESS OF BREATH. What changed:  how much to take how to take this when to take this reasons to take this additional instructions   aspirin EC 81 MG tablet Take 1 tablet (81 mg total) by mouth daily.   atorvastatin 20 MG tablet Commonly known as: LIPITOR TAKE 1 TABLET (20 MG TOTAL) BY MOUTH DAILY.   benzonatate 100 MG capsule Commonly known as: TESSALON Take 1 capsule (100 mg total) by mouth 2 (two) times daily as needed for cough.   busPIRone 5 MG tablet Commonly known as: BUSPAR Take 1 tablet (5 mg total) by mouth 2 (two) times daily.   cetirizine 10 MG tablet Commonly known as: ZYRTEC Take 1 tablet (10 mg total) by mouth daily. What changed:  when to take this reasons to take this   Deep Sea Nasal Spray 0.65 % nasal spray Generic drug: sodium chloride Place 2 sprays into both nostrils every 4 (four) hours.   fluticasone 50 MCG/ACT nasal spray Commonly known as: FLONASE Place 2 sprays into both nostrils daily. What changed:  when to take this reasons to take this   Flutter Devi Use as directed   guaiFENesin-codeine  100-10 MG/5ML syrup Take 5 mLs by mouth 3 (three) times daily as needed for cough.   menthol-cetylpyridinium 3 MG lozenge Commonly known as: CEPACOL Take 1 lozenge (3 mg total) by mouth as needed for sore throat.   nicotine 14 mg/24hr patch Commonly known as: NICODERM CQ - dosed in mg/24 hours Place 1 patch (14 mg total) onto the skin daily.   omeprazole 20 MG capsule Commonly known as: PRILOSEC Take 1 capsule (20 mg total) by mouth daily. What changed:  when to take this reasons to take this    oxybutynin 5 MG tablet Commonly known as: DITROPAN Take 1 tablet (5 mg total) by mouth 2 (two) times daily.   VISINE-AC OP Place 1 drop into both eyes daily as needed (dry eyes).        Disposition and follow-up:   Krystal Allen was discharged from [facility] in Stable condition.  At the hospital follow up visit please address:  1. Acute hypoxic respiratory failure 2/2 to Influenza: Monitor for resolution.  2. COPD exacerbation with hx of COPD: Patient lost follow up to pulmonology, please refer again.  3. AKI on CKD- Resolved 4. Hypokalemia- Resolved 5. Stress incontinence-Follow for persistence  6. Prediabetes- Encouraged healthy diet and exercise. Quitting spoking.    2.  Labs / imaging needed at time of follow-up: CBC, BMP  3.  Pending labs/ test needing follow-up: None  Follow-up Appointments: Pulmonary referral for COPD  Hospital Course by problem list: Likely COPD exacerbation 2/2 influenza infection Patient presented with 2 days of shortness of breath, cough, nasal congestion, and fever. Exposed to family member who tested positive for the flu and the patient is now positive for influenza A. Her oxygen requirement decreased as the days went by.  She completed both Tamiflu and prednisone while inpatient and was satting well on RA at rest but did require 2 L while ambulating. Repeat CXR was negative for acute findings. Her blood cultures were NGTD. She was started on [facility] and stated she likes this better than her home inhaler. Patient complained of nasal bleeding and was given saline nasal spray q4hrs to prevent dryness from supplemental oxygen. Patient endorsed improvement with that. Cephacol was started for her sore throat. Patient was discharged with close PCP follow up and 2 L of oxygen until she recovers from Flu. Encouraged smoking cessation, please refer to pulmonologist.   Hypokalemia K 3.2 on 08/18/21. Low at admission. Repleted as  needed for K of 4. With K being 4.1 on day of discharge.     Hypertension Chronic issue for patient. Currently well controlled without her home meds. She did not receive her home meds due to BP being well controlled most likely in setting of viral infection and poor po intake.    Prediabetes Patient had A1c of 6.1. With prednisone use, her glucose may have ran more than normal but she was not started on any medication. Continue to encourage life style modification.     Stress incontinence Patient notes that when she coughs, she is incontinent of her urine. She endorses prior history of this as well. Follow up outpatient after coughing completely resolves.    Discharge Subjective:  Denied any acute findings. Stated breathing was improved and only thing bothering her was the coughing. She also endorsed a sore throat. Denied feeling SOB unless with exertion, chest pain, muscle pain, fevers or chills.   Discharge Exam:   BP 130/70 (BP Location: Left Arm)  Pulse (!) 59   Temp 97.8 F (36.6 C) (Oral)   Resp 18   Ht 5\' 7"  (1.702 m)   Wt 110.7 kg   SpO2 97%   BMI 38.22 kg/m   Physical Exam:  General: NAD Head: Normocephalic without scalp lesions.  Eyes: Conjunctivae pink, sclerae white, without jaundice.  Mouth: Moist mucus membrane. Neck: Neck supple with full range of motion (ROM). No thyromegaly, no tenderness to palpation.  Lungs: Diffuse rhonchi heard. No wheezes or rhonchi.  Cardiovascular: Normal heart sounds, no r/m/g Abdomen: No TTP, normal bowel sounds MSK: No asymmetry or muscle atrophy. F Neuro: Alert and oriented. CN grossly intact Psych: Normal mood and normal affect      Pertinent Labs, Studies, and Procedures:  CBC Latest Ref Rng & Units 08/20/2021 08/19/2021 08/18/2021  WBC 4.0 - 10.5 K/uL 7.2 5.9 6.8  Hemoglobin 12.0 - 15.0 g/dL 13.1 12.7 13.2  Hematocrit 36.0 - 46.0 % 38.6 38.1 39.9  Platelets 150 - 400 K/uL 234 226 216    CMP Latest Ref Rng & Units  08/20/2021 08/19/2021 08/18/2021  Glucose 70 - 99 mg/dL 103(H) 102(H) 139(H)  BUN 8 - 23 mg/dL 15 16 15   Creatinine 0.44 - 1.00 mg/dL 1.00 1.05(H) 1.15(H)  Sodium 135 - 145 mmol/L 137 136 134(L)  Potassium 3.5 - 5.1 mmol/L 4.1 4.3 4.2  Chloride 98 - 111 mmol/L 107 105 103  CO2 22 - 32 mmol/L 25 24 23   Calcium 8.9 - 10.3 mg/dL 8.2(L) 8.2(L) 8.3(L)  Total Protein 6.5 - 8.1 g/dL - - -  Total Bilirubin 0.3 - 1.2 mg/dL - - -  Alkaline Phos 38 - 126 U/L - - -  AST 15 - 41 U/L - - -  ALT 0 - 44 U/L - - -   US RENAL  Result Date: 08/17/2021 CLINICAL DATA:  AKI EXAM: RENAL / URINARY TRACT ULTRASOUND IMPRESSION: 1. Simple cyst in the superior left kidney. 2. Otherwise normal renal ultrasound. No etiology is seen for the patient's acute kidney injury. Electronically Signed   By: Merilyn Baba M.D.   On: 08/17/2021 00:05   DG Chest Portable 1 View  Result Date: 08/16/2021 CLINICAL DATA:  Shortness of breath EXAM: PORTABLE CHEST 1 VIEW IMPRESSION: Background chronic lung disease with no focal consolidation to suggest pneumonia. Electronically Signed   By: Yetta Glassman M.D.   On: 08/16/2021 13:32      Result Date: 08/19/2021 CLINICAL DATA:  Shortness of breath EXAM: PORTABLE CHEST 2 VIEW   IMPRESSION: No active cardiopulmonary disease.  Discharge Instructions: Discharge Instructions     Call MD for:  difficulty breathing, headache or visual disturbances   Complete by: As directed    Call MD for:  extreme fatigue   Complete by: As directed    Call MD for:  hives   Complete by: As directed    Call MD for:  persistant dizziness or light-headedness   Complete by: As directed    Call MD for:  persistant nausea and vomiting   Complete by: As directed    Call MD for:  redness, tenderness, or signs of infection (pain, swelling, redness, odor or green/yellow discharge around incision site)   Complete by: As directed    Call MD for:  severe uncontrolled pain   Complete by: As directed     Call MD for:  temperature >100.4   Complete by: As directed    Diet - low sodium heart healthy   Complete by: As  directed    Discharge instructions   Complete by: As directed    It was a pleasure taking care of you!  You came to the ED with shortness of breath, fever, chills and body aches. You were found to have flu. We started you on prednisone and tamiflu for which you completed 5 days. Your breathing improved and you required less and less oxygen. Today you only required oxygen with movement and none at rest. We believe you can complete your recovery at home. We will discharge you with oxygen and a close PCP follow up. Your PCP wants to see you on 08/24/21 at 4pm. Please keep this appointment and follow their recommendation.   We did not give you any blood pressure medications while you were in the hospital. Please monitor your blood pressure at home and don't restart your medicines until you see your PCP. I recommend you keep a log of your blood pressure readings and take that to your PCP appointment. The reason your blood pressure was lower than normal was due to having a viral infection and not drinking enough water. This may change in the future and you will require restarting your BP medications. Please follow up with your PCP and follow their advise. I will make a note to your PCP to have you follow up with pulmonology.   Increase activity slowly   Complete by: As directed        Signed: Idamae Schuller, MD Tillie Rung. [facility] Internal Medicine Residency, PGY-1  08/21/2021, 2:54 PM   Pager: [phone]

## 2021-08-23 ENCOUNTER — Telehealth: Payer: Self-pay

## 2021-08-23 DIAGNOSIS — R0602 Shortness of breath: Secondary | ICD-10-CM | POA: Diagnosis not present

## 2021-08-23 NOTE — Telephone Encounter (Signed)
Transition Care Management Unsuccessful Follow-up Telephone Call  Date of discharge and from where: [facility] on 08/21/2021  Attempts:  1st Attempt  Reason for unsuccessful TCM follow-up call:  Left voice message  Pt have scheduled HFU appt with Dr Wynetta Emery on 08/24/2021

## 2021-08-24 ENCOUNTER — Encounter: Payer: Self-pay | Admitting: Internal Medicine

## 2021-08-24 ENCOUNTER — Ambulatory Visit: Payer: Medicare Other | Attending: Internal Medicine | Admitting: Internal Medicine

## 2021-08-24 ENCOUNTER — Other Ambulatory Visit: Payer: Self-pay

## 2021-08-24 ENCOUNTER — Telehealth: Payer: Self-pay

## 2021-08-24 VITALS — BP 126/82 | HR 71 | Ht 67.0 in | Wt 240.0 lb

## 2021-08-24 DIAGNOSIS — Z09 Encounter for follow-up examination after completed treatment for conditions other than malignant neoplasm: Secondary | ICD-10-CM

## 2021-08-24 DIAGNOSIS — Z87891 Personal history of nicotine dependence: Secondary | ICD-10-CM

## 2021-08-24 DIAGNOSIS — J09X2 Influenza due to identified novel influenza A virus with other respiratory manifestations: Secondary | ICD-10-CM

## 2021-08-24 DIAGNOSIS — J432 Centrilobular emphysema: Secondary | ICD-10-CM

## 2021-08-24 DIAGNOSIS — Z23 Encounter for immunization: Secondary | ICD-10-CM | POA: Diagnosis not present

## 2021-08-24 DIAGNOSIS — J9601 Acute respiratory failure with hypoxia: Secondary | ICD-10-CM | POA: Diagnosis not present

## 2021-08-24 DIAGNOSIS — I10 Essential (primary) hypertension: Secondary | ICD-10-CM | POA: Diagnosis not present

## 2021-08-24 MED ORDER — FLUTICASONE FUROATE-VILANTEROL 200-25 MCG/ACT IN AEPB
1.0000 | INHALATION_SPRAY | Freq: Every day | RESPIRATORY_TRACT | 4 refills | Status: DC
  Filled 2021-08-24: qty 60, 30d supply, fill #0
  Filled 2021-10-06: qty 60, 30d supply, fill #1

## 2021-08-24 NOTE — Patient Instructions (Signed)
Check your blood pressure at least twice a week.  The goal is 130/80 or lower.  If your blood pressure starts running above that consistently, you should restart the amlodipine.

## 2021-08-24 NOTE — Progress Notes (Signed)
Stopped all medication after hospital visit.

## 2021-08-24 NOTE — Telephone Encounter (Signed)
Transition Care Management Unsuccessful Follow-up Telephone Call   Date of discharge and from where: [facility] on 08/21/2021   Attempts:  2nd Attempt   Reason for unsuccessful TCM follow-up call:  Left voice message   Pt have scheduled HFU appt with Dr Wynetta Emery on 08/24/2021

## 2021-08-24 NOTE — Progress Notes (Signed)
Patient ID: Krystal Allen, female    DOB: 1956/08/15  MRN: [id number]  CC: hosp f/u  Subjective: Krystal Allen is a 65 y.o. female who presents for hosp f/u.  Her daughter Krystal Allen is with her Her concerns today include:  Pt with hx of HTN, HL, aortic atherosclerosis, COPD, ILD, GERD, preDM/obesity, tob dep, DJD c-spine, spinal stenosis, stress incont, adenomatous colon polyp.  Patient hospitalized 10/17-22/22 with acute hypoxic respiratory failure secondary to influenza and COPD exacerbation.  Chest x-ray negative for acute finding.  She was requiring oxygen of 2 L with ambulation even at the time of discharge.  Advair inhaler was changed to [facility].  Completed a course of Tamiflu and prednisone while inpatient.  A1c was 6.1 still in the range for prediabetes.  She experienced some stress incontinence with coughing.  Both HCTZ and amlodipine were held during hospitalization as her blood pressure was normal.  Today: Patient reports that her breathing is getting better and the cough is getting better.  She is using the O2 with ambulation and sometimes even at rest.  She feels she needs it at nights so she sleeps with it.  She has not smoked since leaving the hospital.  She is now wearing nicotine patches.  She feels that she does better with the Breo inhaler than with Advair and would like to be changed to the [facility].  She is also requesting referral to pulmonary.  She has remained off her home blood pressure medication.  She does have a device to check blood pressure but has not been checking. Due for flu vaccine and pneumonia vaccine. Patient Active Problem List   Diagnosis Date Noted   Influenza A 08/16/2021   Influenza vaccine needed 09/14/2020   Tobacco dependence 05/12/2020   Aortic atherosclerosis ([place]) 05/12/2020   Adenomatous polyp of colon 05/12/2020   ILD (interstitial lung disease) ([place]) 01/26/2018   Cigarette smoker 01/26/2018   Osteoarthritis of cervical spine 12/22/2017    Prediabetes 12/22/2017   Pain of left calf 04/06/2017   Centrilobular emphysema ([place]) 02/23/2016   Obesity (BMI 30-39.9) 01/15/2016   Bilateral shoulder pain 01/15/2016   Esophageal reflux 01/15/2016   Tobacco abuse 05/13/2015   Lung nodule 08/14/2014   Impaired fasting glucose 08/14/2014   Hyperlipidemia 08/14/2014   Personal history of colonic adenomas 06/25/2013   HTN (hypertension) 05/20/2013     Current Outpatient Medications on File Prior to Visit  Medication Sig Dispense Refill   acetaminophen (TYLENOL 8 HOUR) 650 MG CR tablet Take 1 tablet (650 mg total) by mouth 2 (two) times daily as needed for pain. (Patient not taking: Reported on 08/24/2021) 60 tablet 5   albuterol (PROAIR HFA) 108 (90 Base) MCG/ACT inhaler Inhale 2 puffs into the lungs every 4 (four) hours as needed for wheezing or shortness of breath. (Patient not taking: Reported on 08/24/2021) 8.5 g 0   albuterol (PROVENTIL) (2.5 MG/3ML) 0.083% nebulizer solution TAKE 3 MLS BY NEBULIZATION EVERY 6 (SIX) HOURS AS NEEDED FOR WHEEZING OR SHORTNESS OF BREATH. (Patient not taking: Reported on 08/24/2021) 90 mL 1   aspirin EC 81 MG tablet Take 1 tablet (81 mg total) by mouth daily. (Patient not taking: Reported on 08/24/2021) 100 tablet 1   atorvastatin (LIPITOR) 20 MG tablet TAKE 1 TABLET (20 MG TOTAL) BY MOUTH DAILY. (Patient not taking: Reported on 08/24/2021) 90 tablet 1   benzonatate (TESSALON) 100 MG capsule Take 1 capsule (100 mg total) by mouth 2 (two) times daily as needed for  cough. (Patient not taking: Reported on 08/24/2021) 20 capsule 0   busPIRone (BUSPAR) 5 MG tablet Take 1 tablet (5 mg total) by mouth 2 (two) times daily. (Patient not taking: Reported on 08/24/2021) 30 tablet 0   cetirizine (ZYRTEC) 10 MG tablet Take 1 tablet (10 mg total) by mouth daily. (Patient not taking: Reported on 08/24/2021) 30 tablet 1   fluticasone (FLONASE) 50 MCG/ACT nasal spray Place 2 sprays into both nostrils daily. (Patient not  taking: Reported on 08/24/2021) 16 g 6   fluticasone-salmeterol (ADVAIR) 250-50 MCG/ACT AEPB Inhale into the lungs. (Patient not taking: Reported on 08/24/2021) 60 each 6   guaiFENesin-codeine 100-10 MG/5ML syrup Take 5 mLs by mouth 3 (three) times daily as needed for cough. (Patient not taking: Reported on 08/24/2021) 118 mL 0   menthol-cetylpyridinium (CEPACOL) 3 MG lozenge Take 1 lozenge (3 mg total) by mouth as needed for sore throat. (Patient not taking: Reported on 08/24/2021) 27 tablet 0   nicotine (NICODERM CQ - DOSED IN MG/24 HOURS) 14 mg/24hr patch Place 1 patch (14 mg total) onto the skin daily. (Patient not taking: Reported on 08/24/2021) 28 patch 0   omeprazole (PRILOSEC) 20 MG capsule Take 1 capsule (20 mg total) by mouth daily. (Patient not taking: Reported on 08/24/2021) 30 capsule 3   oxybutynin (DITROPAN) 5 MG tablet Take 1 tablet (5 mg total) by mouth 2 (two) times daily. (Patient not taking: No sig reported) 60 tablet 3   Respiratory Therapy Supplies (FLUTTER) DEVI Use as directed (Patient not taking: Reported on 08/24/2021) 1 each 0   sodium chloride (OCEAN) 0.65 % nasal spray Place 2 sprays into both nostrils every 4 (four) hours. (Patient not taking: Reported on 08/24/2021) 44 mL 0   Tetrahydrozoline-Zn Sulfate (VISINE-AC OP) Place 1 drop into both eyes daily as needed (dry eyes). (Patient not taking: Reported on 08/24/2021)     No current facility-administered medications on file prior to visit.    Allergies  Allergen Reactions   Sulfa Antibiotics Anaphylaxis and Hives   Chantix [Varenicline Tartrate]     Bad dreams   Fish Allergy Other (See Comments)    Severe stomach cramps    Penicillins Other (See Comments)    Has patient had a PCN reaction causing immediate rash, facial/tongue/throat swelling, SOB or lightheadedness with hypotension: No Has patient had a PCN reaction causing severe rash involving mucus membranes or skin necrosis: No Has patient had a PCN reaction  that required hospitalization No Has patient had a PCN reaction occurring within the last 10 years: No If all of the above answers are "NO", then may proceed with Cephalosporin use.  Welts    Latex Rash and Other (See Comments)    Dry skin    Social History   Socioeconomic History   Marital status: Married    Spouse name: Not on file   Number of children: Not on file   Years of education: 12+   Highest education level: Not on file  Occupational History   Occupation: PCA    Employer: [employer]  Tobacco Use   Smoking status: Every Day    Packs/day: 0.25    Types: E-cigarettes, Cigarettes   Smokeless tobacco: Never   Tobacco comments:    06/30/20 has cut down on cigarettes using gum and occasional vaping  Vaping Use   Vaping Use: Every day  Substance and Sexual Activity   Alcohol use: Yes    Comment: occasional   Drug use: No   Sexual  activity: Not on file  Other Topics Concern   Not on file  Social History Narrative   Regular exercise-no   Caffeine Use-   Social Determinants of Health   Financial Resource Strain: Not on file  Food Insecurity: Not on file  Transportation Needs: Not on file  Physical Activity: Not on file  Stress: Not on file  Social Connections: Not on file  Intimate Partner Violence: Not on file    Family History  Problem Relation Age of Onset   Cancer Father        prostate   Hypertension Mother    Diabetes Mother    Hypertension Sister    Heart disease Maternal Uncle    Stroke Maternal Grandmother    Hypertension Maternal Grandmother    Hypertension Sister    Colon cancer Neg Hx    Stomach cancer Neg Hx    Rectal cancer Neg Hx    Colon polyps Neg Hx     Past Surgical History:  Procedure Laterality Date   ABDOMINAL HYSTERECTOMY  42yrs ago   due to heavy bleeding and fibroids    COLONOSCOPY     HEMORRHOID BANDING  2014   HEMORRHOID SURGERY  29UTM agao   UMBILICAL HERNIA REPAIR  06/2015    ROS: Review of Systems Negative  except as stated above  PHYSICAL EXAM: BP 126/82   Pulse 71   Ht 5\' 7"  (1.702 m)   Wt 240 lb (108.9 kg)   SpO2 96%   BMI 37.59 kg/m   Physical Exam  General appearance - alert, well appearing, older African-American female and in no distress.  She is wearing her O2 and it is set to 2 L.  Repeat pulse ox by me was 98% on 2 L. Mental status - normal mood, behavior, speech, dress, motor activity, and thought processes Eyes - pupils equal and reactive, extraocular eye movements intact Chest -breath sounds moderately decreased bilaterally.  No wheezes heard. Heart - normal rate, regular rhythm, normal S1, S2, no murmurs, rubs, clicks or gallops Extremities - peripheral pulses normal, no pedal edema, no clubbing or cyanosis   CMP Latest Ref Rng & Units 08/20/2021 08/19/2021 08/18/2021  Glucose 70 - 99 mg/dL 103(H) 102(H) 139(H)  BUN 8 - 23 mg/dL 15 16 15   Creatinine 0.44 - 1.00 mg/dL 1.00 1.05(H) 1.15(H)  Sodium 135 - 145 mmol/L 137 136 134(L)  Potassium 3.5 - 5.1 mmol/L 4.1 4.3 4.2  Chloride 98 - 111 mmol/L 107 105 103  CO2 22 - 32 mmol/L 25 24 23   Calcium 8.9 - 10.3 mg/dL 8.2(L) 8.2(L) 8.3(L)  Total Protein 6.5 - 8.1 g/dL - - -  Total Bilirubin 0.3 - 1.2 mg/dL - - -  Alkaline Phos 38 - 126 U/L - - -  AST 15 - 41 U/L - - -  ALT 0 - 44 U/L - - -   Lipid Panel     Component Value Date/Time   CHOL 142 08/17/2021 0340   CHOL 177 01/27/2021 1009   TRIG 62 08/17/2021 0340   HDL 38 (L) 08/17/2021 0340   HDL 38 (L) 01/27/2021 1009   CHOLHDL 3.7 08/17/2021 0340   VLDL 12 08/17/2021 0340   LDLCALC 92 08/17/2021 0340   LDLCALC 124 (H) 01/27/2021 1009   LDLDIRECT 190.1 04/16/2013 1531    CBC    Component Value Date/Time   WBC 7.2 08/20/2021 0243   RBC 4.38 08/20/2021 0243   HGB 13.1 08/20/2021 0243   HGB 14.1  01/27/2021 1009   HCT 38.6 08/20/2021 0243   HCT 42.5 01/27/2021 1009   PLT 234 08/20/2021 0243   PLT 261 01/27/2021 1009   MCV 88.1 08/20/2021 0243   MCV 89  01/27/2021 1009   MCH 29.9 08/20/2021 0243   MCHC 33.9 08/20/2021 0243   RDW 18.1 (H) 08/20/2021 0243   RDW 15.4 01/27/2021 1009   LYMPHSABS 3.6 08/20/2021 0243   LYMPHSABS 2.2 02/10/2020 1000   MONOABS 0.5 08/20/2021 0243   EOSABS 0.0 08/20/2021 0243   EOSABS 0.2 02/10/2020 1000   BASOSABS 0.0 08/20/2021 0243   BASOSABS 0.1 02/10/2020 1000    ASSESSMENT AND PLAN: 1. Hospital discharge follow-up   2. Acute respiratory failure with hypoxia (HCC) 3. Novel influenza A respiratory infection -Clinically improving.  Prescription given for pulse ox device so that she can check her oxygen level when she is on and off the oxygen.  We will bring her back in 6 weeks to reassess and see whether she still requires oxygen.  Encouraged her to get the flu vaccine.  She wants to wait until follow-up in 4 weeks to get that but she is agreeable to receiving the pneumonia vaccine today. - For home use only DME Other see comment   4. Centrilobular emphysema ([place]) Stop Advair.  Changed to [facility].  Strongly advised her to remain free of cigarettes.  Referral submitted to pulmonary. - fluticasone furoate-vilanterol (BREO ELLIPTA) 200-25 MCG/ACT AEPB; Inhale 1 puff into the lungs daily.  Dispense: 60 each; Refill: 4  5. Former smoker Commended her on quitting.  Encouraged her to remain tobacco free.  6. Essential hypertension Blood pressure is good off medications.  Advised her to check blood pressure at least twice a week with goal being 130/80 or lower.  If blood pressure starts rising above this she should restart amlodipine until she sees me.  7. Need for vaccination against Streptococcus pneumoniae - Pneumococcal conjugate vaccine 20-valent   Patient was given the opportunity to ask questions.  Patient verbalized understanding of the plan and was able to repeat key elements of the plan.   No orders of the defined types were placed in this encounter.    Requested Prescriptions    No  prescriptions requested or ordered in this encounter    No follow-ups on file.  Karle Plumber, MD, FACP

## 2021-08-24 NOTE — Telephone Encounter (Signed)
Noted. Patient had appointment with PCP today.

## 2021-08-25 ENCOUNTER — Other Ambulatory Visit: Payer: Self-pay

## 2021-09-13 ENCOUNTER — Other Ambulatory Visit: Payer: Self-pay

## 2021-09-13 ENCOUNTER — Ambulatory Visit: Payer: Medicare Other | Attending: Internal Medicine | Admitting: Internal Medicine

## 2021-09-13 ENCOUNTER — Encounter: Payer: Self-pay | Admitting: Internal Medicine

## 2021-09-13 VITALS — BP 128/84 | HR 58 | Resp 16 | Wt 248.2 lb

## 2021-09-13 DIAGNOSIS — K219 Gastro-esophageal reflux disease without esophagitis: Secondary | ICD-10-CM

## 2021-09-13 DIAGNOSIS — Z23 Encounter for immunization: Secondary | ICD-10-CM | POA: Diagnosis not present

## 2021-09-13 DIAGNOSIS — R0902 Hypoxemia: Secondary | ICD-10-CM | POA: Diagnosis not present

## 2021-09-13 DIAGNOSIS — I10 Essential (primary) hypertension: Secondary | ICD-10-CM

## 2021-09-13 DIAGNOSIS — J449 Chronic obstructive pulmonary disease, unspecified: Secondary | ICD-10-CM | POA: Diagnosis not present

## 2021-09-13 MED ORDER — AMLODIPINE BESYLATE 10 MG PO TABS: 10.0000 mg | ORAL_TABLET | Freq: Every day | ORAL | 1 refills | Status: DC

## 2021-09-13 MED ORDER — ZOSTER VAC RECOMB ADJUVANTED 50 MCG/0.5ML IM SUSR: 0.5000 mL | Freq: Once | INTRAMUSCULAR | 1 refills | Status: AC

## 2021-09-13 MED ORDER — HYDROCHLOROTHIAZIDE 12.5 MG PO TABS: 12.5000 mg | ORAL_TABLET | Freq: Every day | ORAL | 3 refills | Status: DC

## 2021-09-13 MED ORDER — OMEPRAZOLE 20 MG PO CPDR
20.0000 mg | DELAYED_RELEASE_CAPSULE | Freq: Every day | ORAL | 3 refills | Status: DC
  Filled 2021-09-13 – 2022-02-03 (×2): qty 30, 30d supply, fill #0
  Filled 2022-03-19: qty 30, 30d supply, fill #1
  Filled 2022-04-20: qty 30, 30d supply, fill #2

## 2021-09-13 NOTE — Patient Instructions (Signed)
I have sent refill on omeprazole to your pharmacy.  This is the medication for acid reflux.  I will have you follow-up with our clinical pharmacist in about 1 month for blood pressure recheck.  Please write down your blood pressure readings and bring them with you on that visit.

## 2021-09-13 NOTE — Progress Notes (Signed)
Patient ID: Krystal Allen, female    DOB: [birth year]  MRN: [id number]  CC: Hypertension   Subjective: Krystal Allen is a 65 y.o. female who presents for chronic ds management/f/u hypoxia Her concerns today include:  Pt with hx of HTN, HL, aortic atherosclerosis, COPD, ILD, GERD, preDM/obesity, tob dep, DJD c-spine, spinal stenosis, stress incont, adenomatous colon polyp.  HTN: she has been checking BP daily since last visit.  Does not have log with her.  Several days SBP range 146-154.  She restarted Norvasc 10 and HCTZ 12.5 mg 2 days ago.  -limits salt in foods.  Eats fruits and veggies sometimes.   COPD/hypoxic resp failure: She continues to use supplemental oxygen 2 L.  She has a small oxygen tank with her today that she can roll.  She has a pulse ox device at home and has been checking her oxygen level.  She did not write down her readings.  She does not recall what her readings are on O2. -Oxygen level 89-93 when up moving around without supplemental O2.  Oxygen level at rest without O2 supplement is about 94%.   Using [place] daily.  No recent use of neb.  No increase cough or SOB Remained free of cigarettes.  Will be 1 mth on the 17th.  Not using nicotine patches; they cause weird dreams.  Admits that she is over eating since quitting smoker.  Tries to keep gum or hard candy in her mouth. Request RF on Omeprazole.  Getting more indigestion. Using TUMS HL:  taking Lipitor as prescribed  HM:  agrees for flu shot today.  Thinks she had COVID booster shot at [facility]. Due for shingles vaccine.   Patient Active Problem List   Diagnosis Date Noted   Influenza A 08/16/2021   Influenza vaccine needed 09/14/2020   Tobacco dependence 05/12/2020   Aortic atherosclerosis ([place]) 05/12/2020   Adenomatous polyp of colon 05/12/2020   ILD (interstitial lung disease) ([place]) 01/26/2018   Cigarette smoker 01/26/2018   Osteoarthritis of cervical spine 12/22/2017   Prediabetes 12/22/2017   Pain of left  calf 04/06/2017   Centrilobular emphysema ([place]) 02/23/2016   Obesity (BMI 30-39.9) 01/15/2016   Bilateral shoulder pain 01/15/2016   Esophageal reflux 01/15/2016   Tobacco abuse 05/13/2015   Lung nodule 08/14/2014   Impaired fasting glucose 08/14/2014   Hyperlipidemia 08/14/2014   Personal history of colonic adenomas 06/25/2013   HTN (hypertension) 05/20/2013     Current Outpatient Medications on File Prior to Visit  Medication Sig Dispense Refill   acetaminophen (TYLENOL 8 HOUR) 650 MG CR tablet Take 1 tablet (650 mg total) by mouth 2 (two) times daily as needed for pain. (Patient not taking: Reported on 08/24/2021) 60 tablet 5   albuterol (PROAIR HFA) 108 (90 Base) MCG/ACT inhaler Inhale 2 puffs into the lungs every 4 (four) hours as needed for wheezing or shortness of breath. (Patient not taking: Reported on 08/24/2021) 8.5 g 0   albuterol (PROVENTIL) (2.5 MG/3ML) 0.083% nebulizer solution TAKE 3 MLS BY NEBULIZATION EVERY 6 (SIX) HOURS AS NEEDED FOR WHEEZING OR SHORTNESS OF BREATH. (Patient not taking: Reported on 08/24/2021) 90 mL 1   aspirin EC 81 MG tablet Take 1 tablet (81 mg total) by mouth daily. (Patient not taking: Reported on 08/24/2021) 100 tablet 1   atorvastatin (LIPITOR) 20 MG tablet TAKE 1 TABLET (20 MG TOTAL) BY MOUTH DAILY. (Patient not taking: Reported on 08/24/2021) 90 tablet 1   fluticasone furoate-vilanterol (BREO ELLIPTA) 200-25 MCG/ACT  AEPB Inhale 1 puff into the lungs daily. 60 each 4   oxybutynin (DITROPAN) 5 MG tablet Take 1 tablet (5 mg total) by mouth 2 (two) times daily. (Patient not taking: No sig reported) 60 tablet 3   Respiratory Therapy Supplies (FLUTTER) DEVI Use as directed (Patient not taking: Reported on 08/24/2021) 1 each 0   sodium chloride (OCEAN) 0.65 % nasal spray Place 2 sprays into both nostrils every 4 (four) hours. (Patient not taking: Reported on 08/24/2021) 44 mL 0   No current facility-administered medications on file prior to visit.     Allergies  Allergen Reactions   Sulfa Antibiotics Anaphylaxis and Hives   Chantix [Varenicline Tartrate]     Bad dreams   Fish Allergy Other (See Comments)    Severe stomach cramps    Penicillins Other (See Comments)    Has patient had a PCN reaction causing immediate rash, facial/tongue/throat swelling, SOB or lightheadedness with hypotension: No Has patient had a PCN reaction causing severe rash involving mucus membranes or skin necrosis: No Has patient had a PCN reaction that required hospitalization No Has patient had a PCN reaction occurring within the last 10 years: No If all of the above answers are "NO", then may proceed with Cephalosporin use.  Welts    Latex Rash and Other (See Comments)    Dry skin    Social History   Socioeconomic History   Marital status: Married    Spouse name: Not on file   Number of children: Not on file   Years of education: 12+   Highest education level: Not on file  Occupational History   Occupation: PCA    Employer: [employer]  Tobacco Use   Smoking status: Former    Types: Cigarettes   Smokeless tobacco: Never   Tobacco comments:    06/30/20 has cut down on cigarettes using gum and occasional vaping  Vaping Use   Vaping Use: Every day  Substance and Sexual Activity   Alcohol use: Yes    Comment: occasional   Drug use: No   Sexual activity: Not on file  Other Topics Concern   Not on file  Social History Narrative   Regular exercise-no   Caffeine Use-   Social Determinants of Health   Financial Resource Strain: Not on file  Food Insecurity: Not on file  Transportation Needs: Not on file  Physical Activity: Not on file  Stress: Not on file  Social Connections: Not on file  Intimate Partner Violence: Not on file    Family History  Problem Relation Age of Onset   Cancer Father        prostate   Hypertension Mother    Diabetes Mother    Hypertension Sister    Heart disease Maternal Uncle    Stroke Maternal  Grandmother    Hypertension Maternal Grandmother    Hypertension Sister    Colon cancer Neg Hx    Stomach cancer Neg Hx    Rectal cancer Neg Hx    Colon polyps Neg Hx     Past Surgical History:  Procedure Laterality Date   ABDOMINAL HYSTERECTOMY  29yrs ago   due to heavy bleeding and fibroids    COLONOSCOPY     HEMORRHOID BANDING  2014   HEMORRHOID SURGERY  85IDP agao   UMBILICAL HERNIA REPAIR  06/2015    ROS: Review of Systems Negative except as stated above  PHYSICAL EXAM: BP 128/84   Pulse (!) 58   Resp 16  Wt 248 lb 3.2 oz (112.6 kg)   SpO2 100%   BMI 38.87 kg/m   Physical Exam Pulse ox listed above was on 2 L of O2 Pulse ox at rest on room air rest: 95 to 96% Pulse ox room air ambulation 91 to 95%.  Patient did seem a little bit more dyspneic post ambulation without O2.  General appearance - alert, well appearing, and in no distress Mental status - normal mood, behavior, speech, dress, motor activity, and thought processes Neck - supple, no significant adenopathy Chest - clear to auscultation, no wheezes, rales or rhonchi, symmetric air entry Heart - normal rate, regular rhythm, normal S1, S2, no murmurs, rubs, clicks or gallops Extremities - peripheral pulses normal, no pedal edema, no clubbing or cyanosis   CMP Latest Ref Rng & Units 08/20/2021 08/19/2021 08/18/2021  Glucose 70 - 99 mg/dL 103(H) 102(H) 139(H)  BUN 8 - 23 mg/dL 15 16 15   Creatinine 0.44 - 1.00 mg/dL 1.00 1.05(H) 1.15(H)  Sodium 135 - 145 mmol/L 137 136 134(L)  Potassium 3.5 - 5.1 mmol/L 4.1 4.3 4.2  Chloride 98 - 111 mmol/L 107 105 103  CO2 22 - 32 mmol/L 25 24 23   Calcium 8.9 - 10.3 mg/dL 8.2(L) 8.2(L) 8.3(L)  Total Protein 6.5 - 8.1 g/dL - - -  Total Bilirubin 0.3 - 1.2 mg/dL - - -  Alkaline Phos 38 - 126 U/L - - -  AST 15 - 41 U/L - - -  ALT 0 - 44 U/L - - -   Lipid Panel     Component Value Date/Time   CHOL 142 08/17/2021 0340   CHOL 177 01/27/2021 1009   TRIG 62 08/17/2021  0340   HDL 38 (L) 08/17/2021 0340   HDL 38 (L) 01/27/2021 1009   CHOLHDL 3.7 08/17/2021 0340   VLDL 12 08/17/2021 0340   LDLCALC 92 08/17/2021 0340   LDLCALC 124 (H) 01/27/2021 1009   LDLDIRECT 190.1 04/16/2013 1531    CBC    Component Value Date/Time   WBC 7.2 08/20/2021 0243   RBC 4.38 08/20/2021 0243   HGB 13.1 08/20/2021 0243   HGB 14.1 01/27/2021 1009   HCT 38.6 08/20/2021 0243   HCT 42.5 01/27/2021 1009   PLT 234 08/20/2021 0243   PLT 261 01/27/2021 1009   MCV 88.1 08/20/2021 0243   MCV 89 01/27/2021 1009   MCH 29.9 08/20/2021 0243   MCHC 33.9 08/20/2021 0243   RDW 18.1 (H) 08/20/2021 0243   RDW 15.4 01/27/2021 1009   LYMPHSABS 3.6 08/20/2021 0243   LYMPHSABS 2.2 02/10/2020 1000   MONOABS 0.5 08/20/2021 0243   EOSABS 0.0 08/20/2021 0243   EOSABS 0.2 02/10/2020 1000   BASOSABS 0.0 08/20/2021 0243   BASOSABS 0.1 02/10/2020 1000    ASSESSMENT AND PLAN: 1. COPD with hypoxia ([place]) Patient's oxygen level on room air is staying above 88% however she does have relative hypoxia with ambulation and some dyspnea.  She will continue on home O2 for now with ambulation.  I recommended that she try decreasing the oxygen to 1 L with ambulation as long as her oxygen level stays above 92%.  Continue Breo inhaler. - Ambulatory referral to Pulmonology  2. Essential hypertension Patient restarted amlodipine and hydrochlorothiazide since her last visit with me.  Blood pressure today close to goal.  Continue current medications.  Advised to write down blood pressure readings and bring them with her next visit.  Goal is 130/80 or lower.  She will  see the clinical pharmacist in 1 month for recheck. - amLODipine (NORVASC) 10 MG tablet; Take 1 tablet (10 mg total) by mouth daily.  Dispense: 90 tablet; Refill: 1 - hydrochlorothiazide (HYDRODIURIL) 12.5 MG tablet; Take 1 tablet (12.5 mg total) by mouth daily.  Dispense: 90 tablet; Refill: 3  3. Gastroesophageal reflux disease without  esophagitis GERD precautions discussed and encouraged. - omeprazole (PRILOSEC) 20 MG capsule; Take 1 capsule (20 mg total) by mouth daily.  Dispense: 30 capsule; Refill: 3  4. Need for immunization against influenza - Flu Vaccine QUAD 74mo+IM (Fluarix, Fluzone & Alfiuria Quad PF)  5. Need for shingles vaccine Return in 1 month to see the clinical pharmacist for first shot of Shingrix vaccine.    Patient was given the opportunity to ask questions.  Patient verbalized understanding of the plan and was able to repeat key elements of the plan.   Orders Placed This Encounter  Procedures   Flu Vaccine QUAD 51mo+IM (Fluarix, Fluzone & Alfiuria Quad PF)   Ambulatory referral to Pulmonology     Requested Prescriptions   Signed Prescriptions Disp Refills   omeprazole (PRILOSEC) 20 MG capsule 30 capsule 3    Sig: Take 1 capsule (20 mg total) by mouth daily.   amLODipine (NORVASC) 10 MG tablet 90 tablet 1    Sig: Take 1 tablet (10 mg total) by mouth daily.   hydrochlorothiazide (HYDRODIURIL) 12.5 MG tablet 90 tablet 3    Sig: Take 1 tablet (12.5 mg total) by mouth daily.   Zoster Vaccine Adjuvanted [facility] injection 0.5 mL 1    Sig: Inject 0.5 mLs into the muscle once for 1 dose.    Return in about 4 months (around 01/11/2022) for Give appt with Lurena Joiner in 1 mth for BP check and shingles vaccine.  Karle Plumber, MD, FACP

## 2021-09-15 ENCOUNTER — Other Ambulatory Visit: Payer: Self-pay | Admitting: Internal Medicine

## 2021-09-15 ENCOUNTER — Other Ambulatory Visit: Payer: Self-pay

## 2021-09-15 DIAGNOSIS — E782 Mixed hyperlipidemia: Secondary | ICD-10-CM

## 2021-09-15 NOTE — Telephone Encounter (Signed)
Requested medications are due for refill today. unknown  Requested medications are on the active medications list.  yes  Last refill. 01/28/2021  Future visit scheduled.   yes  Notes to clinic.  Per note of 08/24/2021 pt is not taking this medication. Please advise.

## 2021-09-16 MED ORDER — ATORVASTATIN CALCIUM 20 MG PO TABS
ORAL_TABLET | Freq: Every day | ORAL | 1 refills | Status: DC
  Filled 2021-09-16 – 2022-02-03 (×2): qty 90, 90d supply, fill #0

## 2021-09-17 ENCOUNTER — Other Ambulatory Visit: Payer: Self-pay

## 2021-09-21 DIAGNOSIS — R0602 Shortness of breath: Secondary | ICD-10-CM | POA: Diagnosis not present

## 2021-09-23 DIAGNOSIS — R0602 Shortness of breath: Secondary | ICD-10-CM | POA: Diagnosis not present

## 2021-10-06 ENCOUNTER — Other Ambulatory Visit: Payer: Self-pay | Admitting: Internal Medicine

## 2021-10-06 ENCOUNTER — Other Ambulatory Visit: Payer: Self-pay

## 2021-10-06 DIAGNOSIS — I10 Essential (primary) hypertension: Secondary | ICD-10-CM

## 2021-10-06 NOTE — Telephone Encounter (Signed)
Requested medication (s) are due for refill today: no should be due in Feb  Requested medication (s) are on the active medication list: yes norvasc  Last refill:  09/13/21 #90 1 refill   Future visit scheduled: yes on 01/11/22  Notes to clinic:  do you want to refill norvasc early? Microzide discontinued 08/17/21. CHW- [facility]     Requested Prescriptions  Pending Prescriptions Disp Refills   hydrochlorothiazide (MICROZIDE) 12.5 MG capsule 30 capsule 0    Sig: TAKE 1 TABLET (12.5 MG TOTAL) BY MOUTH DAILY.     Cardiovascular: Diuretics - Thiazide Failed - 10/06/2021  9:33 AM      Failed - Ca in normal range and within 360 days    Calcium  Date Value Ref Range Status  08/20/2021 8.2 (L) 8.9 - 10.3 mg/dL Final   Calcium, Ion  Date Value Ref Range Status  04/14/2019 1.12 (L) 1.15 - 1.40 mmol/L Final          Passed - Cr in normal range and within 360 days    Creat  Date Value Ref Range Status  01/15/2016 1.22 (H) 0.50 - 0.99 mg/dL Final   Creatinine, Ser  Date Value Ref Range Status  08/20/2021 1.00 0.44 - 1.00 mg/dL Final          Passed - K in normal range and within 360 days    Potassium  Date Value Ref Range Status  08/20/2021 4.1 3.5 - 5.1 mmol/L Final          Passed - Na in normal range and within 360 days    Sodium  Date Value Ref Range Status  08/20/2021 137 135 - 145 mmol/L Final  01/27/2021 142 134 - 144 mmol/L Final          Passed - Last BP in normal range    BP Readings from Last 1 Encounters:  09/13/21 128/84          Passed - Valid encounter within last 6 months    Recent Outpatient Visits           3 weeks ago COPD with hypoxia [facility])   [place] Ladell Pier, MD   1 month ago Hospital discharge follow-up   [place], Deborah B, MD   8 months ago Essential hypertension   [place], Deborah B, MD   1 year ago Need for  influenza vaccination   [place], Jarome Matin, RPH-CPP   1 year ago Essential hypertension   [place], MD       Future Appointments             In 1 week Daisy Blossom, Jarome Matin, [place]   In 3 months Ladell Pier, MD [place]             amLODipine (NORVASC) 10 MG tablet 30 tablet 0    Sig: TAKE 1 TABLET (10 MG TOTAL) BY MOUTH DAILY. MUST HAVE OFFICE VISIT FOR REFILLS     Cardiovascular:  Calcium Channel Blockers Passed - 10/06/2021  9:33 AM      Passed - Last BP in normal range    BP Readings from Last 1 Encounters:  09/13/21 128/84          Passed - Valid encounter within  last 6 months    Recent Outpatient Visits           3 weeks ago COPD with hypoxia [facility])   [place] Ladell Pier, MD   1 month ago Hospital discharge follow-up   [place], MD   8 months ago Essential hypertension   [place], Deborah B, MD   1 year ago Need for influenza vaccination   [place], RPH-CPP   1 year ago Essential hypertension   [place], Deborah B, MD       Future Appointments             In 1 week Daisy Blossom, Jarome Matin, [place]   In 3 months Wynetta Emery, Dalbert Batman, MD [place]

## 2021-10-07 ENCOUNTER — Other Ambulatory Visit: Payer: Self-pay

## 2021-10-07 MED ORDER — AMLODIPINE BESYLATE 10 MG PO TABS
10.0000 mg | ORAL_TABLET | Freq: Every day | ORAL | 3 refills | Status: DC
  Filled 2021-10-07 – 2021-12-28 (×3): qty 30, 30d supply, fill #0
  Filled 2021-12-28: qty 30, 30d supply, fill #1

## 2021-10-07 MED ORDER — HYDROCHLOROTHIAZIDE 12.5 MG PO CAPS
ORAL_CAPSULE | Freq: Every day | ORAL | 3 refills | Status: DC
  Filled 2021-10-07 – 2021-12-28 (×3): qty 30, 30d supply, fill #0

## 2021-10-12 NOTE — Progress Notes (Signed)
° °  S:    PCP: Dr. Wynetta Emery  Patient presents to the clinic for hypertension evaluation, counseling, and management. Patient was referred and last seen by Primary Care Provider on 09/13/21. BP was close to goal but she had only recently resumed her BP medications.   Today, patient arrives in good spirits. Reports adherence to her BP medications with no missed doses reported. She checks her BP a couple times a week with reported readings at goal.   Medication adherence reported.  Current BP Medications include:  amlodipine 10 mg daily, HCTZ 12.5 mg daily  Antihypertensives tried in the past include: lisinopril (patient doesn't remember why this was stopped)  Dietary habits include: endorses low salt diet, drinks 3-4 cups coffee/day (more since stopped smoking) Exercise habits include: no current exercise Family / Social history:  -Former smoker - stopped smoking 08/16/21 -HTN in mother, sister, DM in mother  O:  Vitals:   10/13/21 1018  BP: 129/78  Pulse: 64   Home BP readings: 120s/70-80s. Yesterday 126/81.   Last 3 Office BP readings: BP Readings from Last 3 Encounters:  10/13/21 129/78  09/13/21 128/84  08/24/21 126/82    BMET    Component Value Date/Time   NA 137 08/20/2021 0243   NA 142 01/27/2021 1009   K 4.1 08/20/2021 0243   CL 107 08/20/2021 0243   CO2 25 08/20/2021 0243   GLUCOSE 103 (H) 08/20/2021 0243   BUN 15 08/20/2021 0243   BUN 16 01/27/2021 1009   CREATININE 1.00 08/20/2021 0243   CREATININE 1.22 (H) 01/15/2016 1000   CALCIUM 8.2 (L) 08/20/2021 0243   GFRNONAA >60 08/20/2021 0243   GFRNONAA 48 (L) 01/15/2016 1000   GFRAA 63 02/10/2020 1000   GFRAA 56 (L) 01/15/2016 1000    Renal function: CrCl cannot be calculated (Patient's most recent lab result is older than the maximum 21 days allowed.).  Clinical ASCVD: No  The 10-year ASCVD risk score (Arnett DK, et al., 2019) is: 7.5%   Values used to calculate the score:     Age: 65 years     Sex:  Female     Is Non-Hispanic African American: Yes     Diabetic: No     Tobacco smoker: No     Systolic Blood Pressure: 017 mmHg     Is BP treated: Yes     HDL Cholesterol: 38 mg/dL     Total Cholesterol: 142 mg/dL   A/P: Hypertension currently at goal on current medications. BP Goal = < 130/80 mmHg. Medication adherence reported.  -Continue current medications: amlodipine 10 mg daily and HCTZ 12.5 mg daily.  -F/u labs ordered - BMET today after resuming HCTZ recently -Counseled on lifestyle modifications for blood pressure control including reduced dietary sodium, increased exercise, adequate sleep.  Total time in face-to-face counseling 20 minutes. F/U Clinic Visit with PCP as scheduled in March.     Rebbeca Paul, PharmD PGY2 Ambulatory Care Pharmacy Resident 10/13/2021 10:24 AM

## 2021-10-13 ENCOUNTER — Ambulatory Visit: Payer: Medicare Other | Attending: Internal Medicine | Admitting: Pharmacist

## 2021-10-13 ENCOUNTER — Other Ambulatory Visit: Payer: Self-pay

## 2021-10-13 VITALS — BP 129/78 | HR 64

## 2021-10-13 DIAGNOSIS — I10 Essential (primary) hypertension: Secondary | ICD-10-CM | POA: Diagnosis not present

## 2021-10-14 LAB — BASIC METABOLIC PANEL
BUN/Creatinine Ratio: 15 (ref 12–28)
BUN: 21 mg/dL (ref 8–27)
CO2: 23 mmol/L (ref 20–29)
Calcium: 9.3 mg/dL (ref 8.7–10.3)
Chloride: 102 mmol/L (ref 96–106)
Creatinine, Ser: 1.39 mg/dL — ABNORMAL HIGH (ref 0.57–1.00)
Glucose: 94 mg/dL (ref 70–99)
Potassium: 4.3 mmol/L (ref 3.5–5.2)
Sodium: 141 mmol/L (ref 134–144)
eGFR: 42 mL/min/{1.73_m2} — ABNORMAL LOW (ref >59)

## 2021-10-23 DIAGNOSIS — R0602 Shortness of breath: Secondary | ICD-10-CM | POA: Diagnosis not present

## 2021-11-02 ENCOUNTER — Telehealth: Payer: Self-pay | Admitting: Internal Medicine

## 2021-11-02 NOTE — Telephone Encounter (Signed)
Pt is calling to report that when she is out she is getting SOB. Pt previously discussed a portable oxygen tank. Pt would like to know an update on this request. [phone]

## 2021-11-08 NOTE — Telephone Encounter (Signed)
Order for POC faxed to [facility] 

## 2021-11-17 ENCOUNTER — Other Ambulatory Visit: Payer: Self-pay

## 2021-11-23 DIAGNOSIS — R0602 Shortness of breath: Secondary | ICD-10-CM | POA: Diagnosis not present

## 2021-11-25 ENCOUNTER — Other Ambulatory Visit: Payer: Self-pay

## 2021-11-25 ENCOUNTER — Encounter: Payer: Self-pay | Admitting: Pulmonary Disease

## 2021-11-25 ENCOUNTER — Ambulatory Visit (INDEPENDENT_AMBULATORY_CARE_PROVIDER_SITE_OTHER): Payer: Medicare Other | Admitting: Pulmonary Disease

## 2021-11-25 VITALS — BP 132/70 | HR 66 | Temp 97.8°F | Ht 67.0 in | Wt 266.0 lb

## 2021-11-25 DIAGNOSIS — J8482 Adult pulmonary Langerhans cell histiocytosis: Secondary | ICD-10-CM

## 2021-11-25 DIAGNOSIS — J432 Centrilobular emphysema: Secondary | ICD-10-CM | POA: Diagnosis not present

## 2021-11-25 MED ORDER — FLUTICASONE FUROATE-VILANTEROL 200-25 MCG/ACT IN AEPB
1.0000 | INHALATION_SPRAY | Freq: Every day | RESPIRATORY_TRACT | 5 refills | Status: DC
  Filled 2021-11-25: qty 60, 30d supply, fill #0
  Filled 2022-01-11: qty 60, 30d supply, fill #1
  Filled 2022-03-23: qty 60, 30d supply, fill #2

## 2021-11-25 MED ORDER — SPIRIVA RESPIMAT 2.5 MCG/ACT IN AERS
2.0000 | INHALATION_SPRAY | Freq: Every day | RESPIRATORY_TRACT | 5 refills | Status: DC
  Filled 2021-11-25: qty 4, 30d supply, fill #0
  Filled 2022-01-11: qty 4, 30d supply, fill #1
  Filled 2022-05-23: qty 4, 30d supply, fill #2
  Filled 2022-08-22: qty 4, 30d supply, fill #3
  Filled 2022-09-30: qty 4, 30d supply, fill #4

## 2021-11-25 NOTE — Patient Instructions (Signed)
Emphysema --CONTINUE Breo 200-25 mcg ONE puff ONCE a day. REFILL --START Spiriva 2.5 mcg TWO puffs ONCE a day --REFER to Lung Screen Programming  Acute hypoxemic respiratory failure vs chronic --ORDER overnight oximetry on room air to evaluate for oxygen needs --Please call our office once this test is completed  Pulmonary Langerhans cell histiocytosis Moderately reduced DLCO --ARRANGE for pulmonary function tests prior to next visit  Morbid obesity BMI 41.66 --We discussed healthy diet and regular aerobic exercise --Referral to weight management Consider [email or web address]   Follow-up with me in 3 months. Schedule for PFTs prior to visit

## 2021-11-25 NOTE — Progress Notes (Signed)
Subjective:   PATIENT ID: Krystal Allen GENDER: female DOB: Sep 18, 1956, MRN: [id number]   HPI  Chief Complaint  Patient presents with   Consult    SOB DME: [employer] already on oxygen and uses when out of breath    Reason for Visit: New consult for shortness of breath  Krystal Allen is a 66 year old female former smoker with emphysema, chronic hypoxemic respiratory failure, Pulmonary Langerhans cell histiocytosis, hypertension, hyperlipidemia who presents as new consult.  She was hospitalized in October 2022 for COPD exacerbation secondary to influenza. She quit smoking since then and has done well. She does notice she is snacking more in place of her nicotine craving. She has shortness of breath with wheezing. Wheezing occurs at night sometimes. No chronic cough. Symptoms worsen with changes in weather and will use her rescue inhaler 1-2 a week. She is compliant with Breo 200 daily. She wears her oxygen when she feels short of breath. She checks her O2 levels which usually go down to 89%. She reports good quality of sleep >8 hours. She does snore. Her roommate does not report witnessed apnea or gasping. No morning headaches or fatigue.   Social History: Former smoker. 1/2 ppd x 40 years [employer] x 33 years  I have personally reviewed patient's past medical/family/social history, allergies, current medications.  Past Medical History:  Diagnosis Date   Allergy    COPD (chronic obstructive pulmonary disease) (HCC)    Depression    GERD (gastroesophageal reflux disease)    Hernia, abdominal    History of chicken pox    Hyperlipidemia    Hypertension    Internal hemorrhoids with Grade 3 prolapse and bleeding 06/19/2013   Osteoarthritis    Personal history of colonic adenomas 06/25/2013     Family History  Problem Relation Age of Onset   Cancer Father        prostate   Hypertension Mother    Diabetes Mother    Hypertension Sister    Heart disease Maternal Uncle     Stroke Maternal Grandmother    Hypertension Maternal Grandmother    Hypertension Sister    Colon cancer Neg Hx    Stomach cancer Neg Hx    Rectal cancer Neg Hx    Colon polyps Neg Hx      Social History   Occupational History   Occupation: PCA    Employer: [employer]  Tobacco Use   Smoking status: Former    Packs/day: 0.50    Years: 40.00    Pack years: 20.00    Types: Cigarettes   Smokeless tobacco: Never   Tobacco comments:    06/30/20 has cut down on cigarettes using gum and occasional vaping  Vaping Use   Vaping Use: Every day  Substance and Sexual Activity   Alcohol use: Yes    Comment: occasional   Drug use: No   Sexual activity: Not on file    Allergies  Allergen Reactions   Sulfa Antibiotics Anaphylaxis and Hives   Chantix [Varenicline Tartrate]     Bad dreams   Fish Allergy Other (See Comments)    Severe stomach cramps    Penicillins Other (See Comments)    Has patient had a PCN reaction causing immediate rash, facial/tongue/throat swelling, SOB or lightheadedness with hypotension: No Has patient had a PCN reaction causing severe rash involving mucus membranes or skin necrosis: No Has patient had a PCN reaction that required hospitalization No Has patient had a PCN reaction occurring  within the last 10 years: No If all of the above answers are "NO", then may proceed with Cephalosporin use.  Welts    Latex Rash and Other (See Comments)    Dry skin     Outpatient Medications Prior to Visit  Medication Sig Dispense Refill   albuterol (PROAIR HFA) 108 (90 Base) MCG/ACT inhaler Inhale 2 puffs into the lungs every 4 (four) hours as needed for wheezing or shortness of breath. 8.5 g 0   albuterol (PROVENTIL) (2.5 MG/3ML) 0.083% nebulizer solution TAKE 3 MLS BY NEBULIZATION EVERY 6 (SIX) HOURS AS NEEDED FOR WHEEZING OR SHORTNESS OF BREATH. 90 mL 1   amLODipine (NORVASC) 10 MG tablet Take 1 tablet (10 mg total) by mouth daily. 30 tablet 3   aspirin EC 81 MG  tablet Take 1 tablet (81 mg total) by mouth daily. 100 tablet 1   atorvastatin (LIPITOR) 20 MG tablet TAKE 1 TABLET (20 MG TOTAL) BY MOUTH DAILY. 90 tablet 1   hydrochlorothiazide (MICROZIDE) 12.5 MG capsule TAKE 1 TABLET (12.5 MG TOTAL) BY MOUTH DAILY. 30 capsule 3   omeprazole (PRILOSEC) 20 MG capsule Take 1 capsule (20 mg total) by mouth daily. 30 capsule 3   fluticasone furoate-vilanterol (BREO ELLIPTA) 200-25 MCG/ACT AEPB Inhale 1 puff into the lungs daily. 60 each 4   acetaminophen (TYLENOL 8 HOUR) 650 MG CR tablet Take 1 tablet (650 mg total) by mouth 2 (two) times daily as needed for pain. (Patient not taking: Reported on 08/24/2021) 60 tablet 5   oxybutynin (DITROPAN) 5 MG tablet Take 1 tablet (5 mg total) by mouth 2 (two) times daily. (Patient not taking: Reported on 08/17/2021) 60 tablet 3   Respiratory Therapy Supplies (FLUTTER) DEVI Use as directed (Patient not taking: Reported on 08/24/2021) 1 each 0   sodium chloride (OCEAN) 0.65 % nasal spray Place 2 sprays into both nostrils every 4 (four) hours. (Patient not taking: Reported on 08/24/2021) 44 mL 0   No facility-administered medications prior to visit.    Review of Systems  Constitutional:  Negative for chills, diaphoresis, fever, malaise/fatigue and weight loss.  HENT:  Positive for congestion. Negative for ear pain and sore throat.   Respiratory:  Positive for shortness of breath. Negative for cough, hemoptysis, sputum production and wheezing.   Cardiovascular:  Negative for chest pain, palpitations and leg swelling.  Gastrointestinal:  Positive for heartburn. Negative for abdominal pain and nausea.  Genitourinary:  Negative for frequency.  Musculoskeletal:  Negative for joint pain and myalgias.  Skin:  Negative for itching and rash.  Neurological:  Negative for dizziness, weakness and headaches.  Endo/Heme/Allergies:  Does not bruise/bleed easily.  Psychiatric/Behavioral:  Negative for depression. The patient is not  nervous/anxious.     Objective:   Vitals:   11/25/21 1113  BP: 132/70  Pulse: 66  Temp: 97.8 F (36.6 C)  TempSrc: Oral  SpO2: 95%  Weight: 266 lb (120.7 kg)  Height: 5\' 7"  (1.702 m)  SpO2: 95 % O2 Device: None (Room air)  Physical Exam: General: Well-appearing, no acute distress HENT: [place], AT Eyes: EOMI, no scleral icterus Respiratory: Clear to auscultation bilaterally.  No crackles, wheezing or rales Cardiovascular: RRR, -M/R/G, no JVD Extremities:-Edema,-tenderness Neuro: AAO x4, CNII-XII grossly intact Psych: Normal mood, normal affect  Data Reviewed:  Imaging: CT 12/15/17 - Mild emphysema. Numerous pulmonary nodules and thin walled cysts bilaterally. Larged lung nodule is 6 mm in the RUL CXR 08/19/21 - Normal. No infiltrate, effusion or edema.  PFT: 01/26/21  FVC 2.99 (97%) FEV1 2.49 (103%) Ratio 79  TLC 88% DLCO 53% Interpretation: Normal spirometry and lung volumes. No significant BD response present. Moderately reduced gas exchange.  Labs: CBC    Component Value Date/Time   WBC 7.2 08/20/2021 0243   RBC 4.38 08/20/2021 0243   HGB 13.1 08/20/2021 0243   HGB 14.1 01/27/2021 1009   HCT 38.6 08/20/2021 0243   HCT 42.5 01/27/2021 1009   PLT 234 08/20/2021 0243   PLT 261 01/27/2021 1009   MCV 88.1 08/20/2021 0243   MCV 89 01/27/2021 1009   MCH 29.9 08/20/2021 0243   MCHC 33.9 08/20/2021 0243   RDW 18.1 (H) 08/20/2021 0243   RDW 15.4 01/27/2021 1009   LYMPHSABS 3.6 08/20/2021 0243   LYMPHSABS 2.2 02/10/2020 1000   MONOABS 0.5 08/20/2021 0243   EOSABS 0.0 08/20/2021 0243   EOSABS 0.2 02/10/2020 1000   BASOSABS 0.0 08/20/2021 0243   BASOSABS 0.1 02/10/2020 1000       Assessment & Plan:   Discussion: 66 year old female former smoker with emphysema, chronic hypoxemic respiratory failure, Pulmonary Langerhans cell histiocytosis, hypertension, hyperlipidemia who presents as new consult. Ambulatory O2 with no desaturations. [place] O2  91%  Emphysema --CONTINUE Breo 200-25 mcg ONE puff ONCE a day. REFILL --START Spiriva 2.5 mcg TWO puffs ONCE a day --REFER to Lung Screen Programming  Acute hypoxemic respiratory failure vs chronic --ORDER overnight oximetry on room air to evaluate for oxygen needs --Please call our office once this test is completed  Pulmonary Langerhans cell histiocytosis Moderately reduced DLCO --ARRANGE for pulmonary function tests prior to next visit  Morbid obesity BMI 41.66 --We discussed healthy diet and regular aerobic exercise --Referral to weight management Consider [email or web address]  Health Maintenance Immunization History  Administered Date(s) Administered   Influenza Split 07/10/2017   Influenza,inj,Quad PF,6+ Mos 08/14/2014, 09/14/2020, 09/13/2021   PFIZER(Purple Top)SARS-COV-2 Vaccination 05/12/2020, 06/02/2020   PNEUMOCOCCAL CONJUGATE-20 08/24/2021   Tdap 08/14/2014   CT Lung Screen- REFER  Orders Placed This Encounter  Procedures   Amb Ref to Medical Weight Management    Referral Priority:   Routine    Referral Type:   Consultation    Number of Visits Requested:   1   Ambulatory Referral for Lung Cancer Scre    Referral Priority:   Routine    Referral Type:   Consultation    Referral Reason:   Specialty Services Required    Number of Visits Requested:   1   Pulse oximetry, overnight    ONO - Room air    Standing Status:   Future    Standing Expiration Date:   11/25/2022   Pulmonary function test    Standing Status:   Future    Standing Expiration Date:   11/25/2022    Order Specific Question:   Where should this test be performed?    Answer:   [facility]    Order Specific Question:   Full PFT: includes the following: basic spirometry, spirometry pre & post bronchodilator, diffusion capacity (DLCO), lung volumes    Answer:   Full PFT   Meds ordered this encounter  Medications   fluticasone furoate-vilanterol (BREO ELLIPTA) 200-25 MCG/ACT AEPB     Sig: Inhale 1 puff into the lungs daily.    Dispense:  60 each    Refill:  5   Tiotropium Bromide Monohydrate (SPIRIVA RESPIMAT) 2.5 MCG/ACT AERS    Sig: Inhale 2 puffs into the lungs daily.    Dispense:  4 g  Refill:  5    Return in about 3 months (around 02/23/2022).  I have spent a total time of 45-minutes on the day of the appointment reviewing prior documentation, coordinating care and discussing medical diagnosis and plan with the patient/family. Imaging, labs and tests included in this note have been reviewed and interpreted independently by me.  [place], MD [facility] 11/25/2021 12:22 PM  Office Number [phone]

## 2021-11-26 ENCOUNTER — Other Ambulatory Visit: Payer: Self-pay

## 2021-12-01 ENCOUNTER — Telehealth: Payer: Self-pay | Admitting: Internal Medicine

## 2021-12-01 NOTE — Telephone Encounter (Signed)
Copied from [place] [id number]. Topic: General - Other >> Dec 01, 2021 10:41 AM Tessa Lerner A wrote: Reason for CRM: The patient has returned a missed call from the practice   Please contact further when available

## 2021-12-02 ENCOUNTER — Telehealth (INDEPENDENT_AMBULATORY_CARE_PROVIDER_SITE_OTHER): Payer: Self-pay | Admitting: Internal Medicine

## 2021-12-02 NOTE — Telephone Encounter (Signed)
Copied from [place] [id number]. Topic: General - Other >> Dec 02, 2021 10:11 AM Tessa Lerner A wrote: Reason for CRM: The patient has made an additional call to the practice requesting to speak with Jay'A when possible  Please contact further when available

## 2021-12-02 NOTE — Telephone Encounter (Signed)
Returned pt call. Pt states she called Monday because she is wanting Dr. Wynetta Emery to prescribed her a diet pill because she has stopped smoking and she has been gaining weight. Made pt aware that Dr. Wynetta Emery will have to discuss this with her. Made pt aware that she already has an appt for 3/14. Pt states well why can't the provider call her made pt aware that this will obtain an office visit. Pt states she understands and doesn't have any questions or concerns

## 2021-12-06 NOTE — Telephone Encounter (Signed)
Already spoken with pt on 2/2

## 2021-12-08 ENCOUNTER — Telehealth: Payer: Self-pay | Admitting: Pulmonary Disease

## 2021-12-08 DIAGNOSIS — G4734 Idiopathic sleep related nonobstructive alveolar hypoventilation: Secondary | ICD-10-CM

## 2021-12-08 DIAGNOSIS — G4730 Sleep apnea, unspecified: Secondary | ICD-10-CM | POA: Diagnosis not present

## 2021-12-08 DIAGNOSIS — R0683 Snoring: Secondary | ICD-10-CM | POA: Diagnosis not present

## 2021-12-09 NOTE — Telephone Encounter (Signed)
Noted. Will forward to [facility] to look out for results.

## 2021-12-13 NOTE — Telephone Encounter (Signed)
[facility] Overnight Oximetry Results  Reviewed ONO. Testing was completed on 12/08/21. Patient had SpO2 <88% for 2hr 33 min 17 sec. Nadir SpO2 79%  Assessment Nocturnal Hypoxemia --ORDER 2L O2 via [place] nightly while sleeping  Rodman Pickle, M.D. [facility]Critical [facility] Medicine 12/13/2021 12:35 PM

## 2021-12-23 ENCOUNTER — Ambulatory Visit: Payer: Self-pay

## 2021-12-23 ENCOUNTER — Encounter: Payer: Self-pay | Admitting: Nurse Practitioner

## 2021-12-23 ENCOUNTER — Ambulatory Visit (INDEPENDENT_AMBULATORY_CARE_PROVIDER_SITE_OTHER): Payer: Medicare Other | Admitting: Nurse Practitioner

## 2021-12-23 VITALS — BP 153/90 | HR 74 | Temp 98.7°F | Resp 18 | Ht 69.0 in

## 2021-12-23 DIAGNOSIS — N61 Mastitis without abscess: Secondary | ICD-10-CM | POA: Diagnosis not present

## 2021-12-23 MED ORDER — DOXYCYCLINE HYCLATE 100 MG PO TABS: 100.0000 mg | ORAL_TABLET | Freq: Two times a day (BID) | ORAL | 0 refills | Status: AC

## 2021-12-23 NOTE — Progress Notes (Signed)
@Patient  ID: Krystal Allen, female    DOB: [birth year], 66 y.o.   MRN: [id number]  Chief Complaint  Patient presents with   Breast Pain    R   Breast Problem    Referring provider: Ladell Pier, MD    HPI  Patient presents today with right breast pain.  She states that this started this past Tuesday.  Patient states that she is very tender around her right nipple area. She denies any drainage or bleeding.  Patient denies any recent injury to this area. Denies f/c/s, n/v/d, hemoptysis, PND, chest pain.    Allergies  Allergen Reactions   Sulfa Antibiotics Anaphylaxis and Hives   Chantix [Varenicline Tartrate]     Bad dreams   Fish Allergy Other (See Comments)    Severe stomach cramps    Penicillins Other (See Comments)    Has patient had a PCN reaction causing immediate rash, facial/tongue/throat swelling, SOB or lightheadedness with hypotension: No Has patient had a PCN reaction causing severe rash involving mucus membranes or skin necrosis: No Has patient had a PCN reaction that required hospitalization No Has patient had a PCN reaction occurring within the last 10 years: No If all of the above answers are "NO", then may proceed with Cephalosporin use.  Welts    Latex Rash and Other (See Comments)    Dry skin    Immunization History  Administered Date(s) Administered   Influenza Split 07/10/2017   Influenza,inj,Quad PF,6+ Mos 08/14/2014, 09/14/2020, 09/13/2021   PFIZER(Purple Top)SARS-COV-2 Vaccination 05/12/2020, 06/02/2020   PNEUMOCOCCAL CONJUGATE-20 08/24/2021   Tdap 08/14/2014    Past Medical History:  Diagnosis Date   Allergy    COPD (chronic obstructive pulmonary disease) ([place])    Depression    GERD (gastroesophageal reflux disease)    Hernia, abdominal    History of chicken pox    Hyperlipidemia    Hypertension    Internal hemorrhoids with Grade 3 prolapse and bleeding 06/19/2013   Osteoarthritis    Personal history of colonic adenomas 06/25/2013     Tobacco History: Social History   Tobacco Use  Smoking Status Former   Packs/day: 0.50   Years: 40.00   Pack years: 20.00   Types: Cigarettes  Smokeless Tobacco Never  Tobacco Comments   06/30/20 has cut down on cigarettes using gum and occasional vaping   Patient has not smoked in 4 months- 12/23/21- Leodis Sias L RMA   Counseling given: Not Answered Tobacco comments: 06/30/20 has cut down on cigarettes using gum and occasional vaping Patient has not smoked in 4 months- 12/23/21- Leodis Sias L RMA   Outpatient Encounter Medications as of 12/23/2021  Medication Sig   albuterol (PROAIR HFA) 108 (90 Base) MCG/ACT inhaler Inhale 2 puffs into the lungs every 4 (four) hours as needed for wheezing or shortness of breath.   albuterol (PROVENTIL) (2.5 MG/3ML) 0.083% nebulizer solution TAKE 3 MLS BY NEBULIZATION EVERY 6 (SIX) HOURS AS NEEDED FOR WHEEZING OR SHORTNESS OF BREATH.   amLODipine (NORVASC) 10 MG tablet Take 1 tablet (10 mg total) by mouth daily.   aspirin EC 81 MG tablet Take 1 tablet (81 mg total) by mouth daily.   atorvastatin (LIPITOR) 20 MG tablet TAKE 1 TABLET (20 MG TOTAL) BY MOUTH DAILY.   doxycycline (VIBRA-TABS) 100 MG tablet Take 1 tablet (100 mg total) by mouth 2 (two) times daily for 7 days.   fluticasone furoate-vilanterol (BREO ELLIPTA) 200-25 MCG/ACT AEPB Inhale 1 puff into the lungs daily.   hydrochlorothiazide (MICROZIDE)  12.5 MG capsule TAKE 1 TABLET (12.5 MG TOTAL) BY MOUTH DAILY.   omeprazole (PRILOSEC) 20 MG capsule Take 1 capsule (20 mg total) by mouth daily.   Tiotropium Bromide Monohydrate (SPIRIVA RESPIMAT) 2.5 MCG/ACT AERS Inhale 2 puffs into the lungs daily.   acetaminophen (TYLENOL 8 HOUR) 650 MG CR tablet Take 1 tablet (650 mg total) by mouth 2 (two) times daily as needed for pain. (Patient not taking: Reported on 08/24/2021)   oxybutynin (DITROPAN) 5 MG tablet Take 1 tablet (5 mg total) by mouth 2 (two) times daily. (Patient not taking: Reported on 08/17/2021)    Respiratory Therapy Supplies (FLUTTER) DEVI Use as directed (Patient not taking: Reported on 08/24/2021)   sodium chloride (OCEAN) 0.65 % nasal spray Place 2 sprays into both nostrils every 4 (four) hours. (Patient not taking: Reported on 08/24/2021)   No facility-administered encounter medications on file as of 12/23/2021.     Review of Systems  Review of Systems  Constitutional: Negative.   HENT: Negative.    Cardiovascular: Negative.   Gastrointestinal: Negative.   Skin:        Right nipple pain/ inflammation  Allergic/Immunologic: Negative.   Neurological: Negative.   Psychiatric/Behavioral: Negative.        Physical Exam  BP (!) 153/90 (BP Location: Left Arm, Patient Position: Sitting, Cuff Size: Large)    Pulse 74    Temp 98.7 F (37.1 C) (Oral)    Resp 18    Ht 5\' 9"  (1.753 m)    SpO2 95% Comment: RA- 2L at home   BMI 39.28 kg/m   Wt Readings from Last 5 Encounters:  11/25/21 266 lb (120.7 kg)  09/13/21 248 lb 3.2 oz (112.6 kg)  08/24/21 240 lb (108.9 kg)  08/16/21 244 lb (110.7 kg)  09/14/20 251 lb 6.4 oz (114 kg)     Physical Exam Vitals and nursing note reviewed.  Constitutional:      General: She is not in acute distress.    Appearance: She is well-developed.  Cardiovascular:     Rate and Rhythm: Normal rate and regular rhythm.  Pulmonary:     Effort: Pulmonary effort is normal.     Breath sounds: Normal breath sounds.  Chest:  Breasts:    Right: Skin change (redness to areola region and nipple) and tenderness (to area noted) present. No swelling, bleeding, inverted nipple or nipple discharge.    Neurological:     Mental Status: She is alert and oriented to person, place, and time.     Lab Results:    Assessment & Plan:   Mastitis, right, acute - doxycycline (VIBRA-TABS) 100 MG tablet; Take 1 tablet (100 mg total) by mouth 2 (two) times daily for 7 days.  Dispense: 14 tablet; Refill: 0   Warm wet compresses  May use vaseline to right  nipple  Will order keflex  Stay well hydrated   Follow up:  Follow up with PCP in 2 weeks     Fenton Foy, NP 12/23/2021

## 2021-12-23 NOTE — Assessment & Plan Note (Signed)
-   doxycycline (VIBRA-TABS) 100 MG tablet; Take 1 tablet (100 mg total) by mouth 2 (two) times daily for 7 days.  Dispense: 14 tablet; Refill: 0   Warm wet compresses  May use vaseline to right nipple  Will order keflex  Stay well hydrated   Follow up:  Follow up with PCP in 2 weeks

## 2021-12-23 NOTE — Progress Notes (Signed)
Patient complains of R breast pain since Tuesday with tenderness and no discharge from the nipple.

## 2021-12-23 NOTE — Patient Instructions (Addendum)
1. Mastitis, right, acute  - doxycycline (VIBRA-TABS) 100 MG tablet; Take 1 tablet (100 mg total) by mouth 2 (two) times daily for 7 days.  Dispense: 14 tablet; Refill: 0   Warm wet compresses  May use vaseline to right nipple  Will order keflex  Stay well hydrated   Follow up:  Follow up with PCP in 2 weeks     Mastitis Mastitis is irritation and swelling (inflammation) in an area of the breast. It is caused by an infection that occurs when germs (bacteria) enter the skin. This most often happens to breastfeeding mothers, but it can happen to other women too as well as some men. What are the causes? This condition is caused by: Germs. This can happen when germs enter the breast through cuts or openings in the skin. A plugged milk duct. This happens when something blocks the flow of milk in the breast. Nipple piercing. The pierced area can allow germs to enter the breast. Some types of breast cancer. What are the signs or symptoms? Swelling, redness, and pain in the breast. Swelling of the glands under the arm. Fluid flowing from the nipple. Fever. Chills. Vomiting or feeling like you may vomit. Fast heart rate. Feeling very tired. Headache. Muscle aches. A build-up of pus. This is also called an abscess. How is this treated? This condition may be treated with: Using heat or cold compresses. Taking medicine for pain. Taking antibiotic medicine. Rest. Drinking plenty of fluid. Removing fluid with a needle, if an abscess has developed. Follow these instructions at home: If you are breastfeeding:  Keep emptying your breasts by breastfeeding or by using a breast pump. Ask your doctor if you should make changes to your breast feeding or pumping routine. During breastfeeding, empty the first breast before going to the other breast. Use a breast pump if your baby is not emptying your breasts. Keep your nipples clean and dry. Massage your breasts during feeding or  pumping as told by your doctor. If told, put moist heat on the affected area of your breast right before breastfeeding or pumping. Use the heat source that your doctor tells you to use. If told, put ice on the affected area of your breast right after breastfeeding or pumping. To do this: Put ice in a plastic bag. Place a towel between your skin and the bag. Leave the ice on for 20 minutes. Take off the ice if your skin turns bright red. This is very important. If you cannot feel pain, heat, or cold, you have a greater risk of damage to the area. If you go back to work, pump your breasts while at work. Avoid letting your breasts get overly filled with milk (engorged). Medicines Take over-the-counter and prescription medicines only as told by your doctor. If you were prescribed an antibiotic medicine, take it as told by your doctor. Do not stop taking it even if you start to feel better. General instructions Do not wear a tight or underwire bra. Wear a soft support bra. Drink enough fluid to keep your pee (urine) pale yellow. This is important if you have a fever. Get plenty of rest. Keep all follow-up visits. Contact a doctor if: You have pus-like fluid leaking from your breast. You have a fever. Your symptoms do not get better within 2 days. Get help right away if: Your pain and swelling are getting worse. Your pain is not helped by medicine. You have a red line going from your breast toward your  armpit. Summary Mastitis is irritation and swelling in an area of the breast. If you were prescribed an antibiotic medicine, do not stop taking it even if you start to feel better. Drink more fluids and get plenty of rest. Contact a doctor if your symptoms do not get better within 2 days. This information is not intended to replace advice given to you by your health care provider. Make sure you discuss any questions you have with your health care provider. Document Revised: 08/17/2020 Document  Reviewed: 08/17/2020 Elsevier Patient Education  2022 [employer].

## 2021-12-23 NOTE — Telephone Encounter (Signed)
°  Chief Complaint: nipple pain Symptoms: R nipple pain and tenderness Frequency: since Tuesday  Pertinent Negatives: Patient denies swelling or discharge Disposition: [] ED /[] Urgent Care (no appt availability in office) / [x] Appointment(In office/virtual)/ []  [place] [facility]/ [] Home Care/ [] Refused Recommended Disposition /[] [place] [facility]/ []  Follow-up with PCP Additional Notes: reached out to [place], [facility] who advised pt can go to [facility] as [facility] and be seen. Dr. Wynetta Emery had appt today at 1410 but pt was unable to get here d/t picking up grandson. Pt is going to [facility] today.   Message from Scherrie Gerlach sent at 12/23/2021  1:42 PM EST  Pt states her right nipple hurts, and very painful to the touch.  It hurts when the seatbelt touches it.    Reason for Disposition  [1] Breast pain AND [2] cause is not known  Answer Assessment - Initial Assessment Questions 1. SYMPTOM: "What's the main symptom you're concerned about?"  (e.g., lump, pain, rash, nipple discharge)     Hurts and tender  2. LOCATION: "Where is the sx located?"     R nipple 3. ONSET: "When did sx  start?"     Tuesday 4. PRIOR HISTORY: "Do you have any history of prior problems with your breasts?" (e.g., lumps, cancer, fibrocystic breast disease)     No 6. OTHER SYMPTOMS: "Do you have any other symptoms?" (e.g., fever, breast pain, redness or rash, nipple discharge)    No  Protocols used: Breast Symptoms-A-AH

## 2021-12-24 DIAGNOSIS — R0602 Shortness of breath: Secondary | ICD-10-CM | POA: Diagnosis not present

## 2021-12-28 ENCOUNTER — Other Ambulatory Visit (HOSPITAL_COMMUNITY): Payer: Self-pay

## 2021-12-28 ENCOUNTER — Other Ambulatory Visit: Payer: Self-pay

## 2021-12-29 ENCOUNTER — Other Ambulatory Visit: Payer: Self-pay | Admitting: Nurse Practitioner

## 2021-12-29 DIAGNOSIS — N61 Mastitis without abscess: Secondary | ICD-10-CM

## 2021-12-30 ENCOUNTER — Ambulatory Visit: Payer: Self-pay | Admitting: *Deleted

## 2021-12-30 ENCOUNTER — Telehealth: Payer: Self-pay | Admitting: Pharmacist

## 2021-12-30 NOTE — Telephone Encounter (Signed)
?  Chief Complaint: request refill antibiotic- mastitis ?Symptoms: 2/23 diagnosed with mastitis- patient states she had had much improvement- but still has pain at tip of her nipple and believes extended dose would help clear. ?Frequency:   ?Pertinent Negatives: Patient denies other symptoms- no fever, discharge ?Disposition: [] ED /[] Urgent Care (no appt availability in office) / [] Appointment(In office/virtual)/ []  [place] [facility]/ [] Home Care/ [] Refused Recommended Disposition /[] [place] [facility]/ [x]  Follow-up with PCP ?Additional Notes: Patient is down to last dose of antibiotic- she is requesting extended dose- symptoms not quite gone- patient does have follow up 3/14 with provider. Please send Rx to [facility] and contact patient ?

## 2021-12-30 NOTE — Telephone Encounter (Signed)
Summary: Nipple Pain improve  ? Right side nipple pain has improved but still sore, requesting a refill of doxycycline (VIBRA-TABS) 100 MG tablet.   ?  ? ?Reason for Disposition ?? [1] Finished taking antibiotics AND [2] symptoms are BETTER but [3] not completely gone ? ?Answer Assessment - Initial Assessment Questions ?1. INFECTION: "What infection is the antibiotic being given for?" ?    Mastitis- 2/23 ?2. ANTIBIOTIC: "What antibiotic are you taking" "How many times per day?" ?    doxycycline ?3. DURATION: "When was the antibiotic started?" ?    7 days- last tablet today ?4. MAIN CONCERN OR SYMPTOM:  "What is your main concern right now?" ?    Pain in area- pills have helped a whole lot- tip of nipple still sore ?5. BETTER-SAME-WORSE: "Are you getting better, staying the same, or getting worse compared to when you first started the antibiotics?" If getting worse, ask: "In what way?"  ?    Getting better ?6. FEVER: "Do you have a fever?" If Yes, ask: "What is your temperature, how was it measured, and when did it start?" ?    no ?7. SYMPTOMS: "Are there any other symptoms you're concerned about?" If Yes, ask: "When did it start?" ?    no ?8. FOLLOW-UP APPOINTMENT: "Do you have a follow-up appointment with your doctor?" ?    2 week - appointment 01/11/22 ? ?Protocols used: Infection on Antibiotic Follow-up Call-A-AH ? ?

## 2021-12-30 NOTE — Telephone Encounter (Signed)
Pt is requesting a refill on an antibiotic and I cannot approve this. Pt was prescribed doxycycline by NP [facility] at the end of last month. Will route message to her for review and approval if appropriate.  ?

## 2021-12-31 ENCOUNTER — Other Ambulatory Visit: Payer: Self-pay

## 2021-12-31 ENCOUNTER — Ambulatory Visit: Payer: Self-pay | Admitting: *Deleted

## 2021-12-31 MED ORDER — DOXYCYCLINE HYCLATE 100 MG PO TABS: 100.0000 mg | ORAL_TABLET | Freq: Two times a day (BID) | ORAL | 0 refills | Status: DC

## 2021-12-31 NOTE — Telephone Encounter (Signed)
Sent My-Chart message

## 2021-12-31 NOTE — Telephone Encounter (Signed)
Pt made an additional call regarding this Rx refill, please advise.  ?

## 2021-12-31 NOTE — Telephone Encounter (Signed)
Pt following up on Rx for abx.  Pt states she is still having pain in her nipple. ?Pt is frustrated and told to call her dr. So pt not sure what to do.  She doesn't see Dr Wynetta Emery until 3/14. ?

## 2021-12-31 NOTE — Telephone Encounter (Signed)
Patient returned call and verbalized understanding PCP will have to write prescription and if PCP will write prescription for doxycyline to send to [facility] on [address].  ? ? ?

## 2021-12-31 NOTE — Telephone Encounter (Signed)
?  Chief Complaint: requesting additional antibiotics until seen by PCP 01/11/22 ?Symptoms: right nipple pain . Reports she called in for refill for doxycyline prescribed last month by [facility] and pharmacist can not approve.  ?Frequency: na  ?Pertinent Negatives: Patient denies na  ?Disposition: [] ED /[] Urgent Care (no appt availability in office) / [x] Appointment(In office/virtual)/ []  [place] [facility]/ [] Home Care/ [] Refused Recommended Disposition /[] [place] [facility]/ [x]  Follow-up with PCP ?Additional Notes:  ? ?Requesting doxycyline medication. Unable to get earlier appt with PCP. Patient misunderstanding and feels she had antibiotic called in to pharmacy at [facility] in [place] due to she will need to pay some on medication. Attempted to contact [facility] on [facility] for patient and was disconnected. Called patient back and left message to call back and she could call pharmacy for further information. Please advise . No written orders noted for additional refill for doxycyline . Please advise .  ? ? Reason for Disposition ? [1] Breast pain AND [2] cause is not known ? ?Answer Assessment - Initial Assessment Questions ?1. SYMPTOM: "What's the main symptom you're concerned about?"  (e.g., lump, pain, rash, nipple discharge) ?    Right nipple pain at tip of nipple continues  ?2. LOCATION: "Where is the pain  located?" ?    Tip of right nipple  ?3. ONSET: "When did pain  start?" ?    Has had antibiotics and now pain is back  ?4. PRIOR HISTORY: "Do you have any history of prior problems with your breasts?" (e.g., lumps, cancer, fibrocystic breast disease) ?    na ?5. CAUSE: "What do you think is causing this symptom?" ?    na ?6. OTHER SYMPTOMS: "Do you have any other symptoms?" (e.g., fever, breast pain, redness or rash, nipple discharge) ?    Nipple pain ?7. PREGNANCY-BREASTFEEDING: "Is there any chance you are pregnant?" "When was your last menstrual period?" "Are you  breastfeeding?" ?    na ? ?Protocols used: Breast Symptoms-A-AH ? ?

## 2021-12-31 NOTE — Addendum Note (Signed)
Addended by: Karle Plumber B on: 12/31/2021 01:15 PM ? ? Modules accepted: Orders ? ?

## 2022-01-04 NOTE — Telephone Encounter (Signed)
Please make follow up appt with PCP ?

## 2022-01-04 NOTE — Telephone Encounter (Signed)
Pt has an appt with PCP 3/14 ?

## 2022-01-11 ENCOUNTER — Telehealth: Payer: Self-pay

## 2022-01-11 ENCOUNTER — Other Ambulatory Visit: Payer: Self-pay

## 2022-01-11 ENCOUNTER — Ambulatory Visit: Payer: Medicare Other | Attending: Internal Medicine | Admitting: Internal Medicine

## 2022-01-11 ENCOUNTER — Encounter: Payer: Self-pay | Admitting: Internal Medicine

## 2022-01-11 VITALS — BP 138/82 | HR 62 | Resp 16 | Wt 271.8 lb

## 2022-01-11 DIAGNOSIS — R600 Localized edema: Secondary | ICD-10-CM

## 2022-01-11 DIAGNOSIS — N6452 Nipple discharge: Secondary | ICD-10-CM | POA: Diagnosis not present

## 2022-01-11 DIAGNOSIS — R0902 Hypoxemia: Secondary | ICD-10-CM

## 2022-01-11 DIAGNOSIS — I10 Essential (primary) hypertension: Secondary | ICD-10-CM | POA: Diagnosis not present

## 2022-01-11 DIAGNOSIS — N644 Mastodynia: Secondary | ICD-10-CM | POA: Diagnosis not present

## 2022-01-11 DIAGNOSIS — Z803 Family history of malignant neoplasm of breast: Secondary | ICD-10-CM

## 2022-01-11 DIAGNOSIS — J449 Chronic obstructive pulmonary disease, unspecified: Secondary | ICD-10-CM

## 2022-01-11 MED ORDER — HYDROCHLOROTHIAZIDE 25 MG PO TABS
25.0000 mg | ORAL_TABLET | Freq: Every day | ORAL | 3 refills | Status: DC
  Filled 2022-01-11: qty 90, 90d supply, fill #0

## 2022-01-11 MED ORDER — AMLODIPINE BESYLATE 5 MG PO TABS
5.0000 mg | ORAL_TABLET | Freq: Every day | ORAL | 5 refills | Status: DC
  Filled 2022-01-11: qty 30, 30d supply, fill #0

## 2022-01-11 NOTE — Telephone Encounter (Signed)
Call placed to [place] to check on status of POC order. Spoke to [facility] who stated that per her notes, the patient did not qualify for POC. She will have the team that evaluated patient call this CM back with more information.  ?

## 2022-01-11 NOTE — Progress Notes (Signed)
? ? ?Patient ID: Krystal Allen, female    DOB: 1955-12-25  MRN: [id number] ? ?CC: Edema (B/l feet ), Weight Loss (wants medication ), Hypertension, and COPD ? ? ?Subjective: ?Krystal Allen is a 66 y.o. female who presents for chronic ds management,. ?Her concerns today include:  ?Pt with hx of HTN, HL, aortic atherosclerosis, COPD, ILD, GERD, CKD 2-3, preDM/obesity, tob dep, DJD c-spine, spinal stenosis, stress incont, adenomatous colon polyp. ? ?HTN: compliant with Amlodipine 10 mg and HCTZ 12.5 mg daily ?Limits salt in foods.  Has device but not checking blood pressure. ?Complains of pedal edema that gets worse as the day progresses.  Has to elevate legs at times which helps some. ?-Sleeps on 2 pillows.  Endorses PND.  No chest pains. ? ?Obesity:  ?She is requesting a diet pill to "get this weight off of me." ?Gained 23 pounds since November of last year.  Since she quit smoking, she has been eating a lot more.  Tells me that she snacks and at first was a bit vague when asked which she snacks on.  She eventually told me that she snacks on chips, cookies, candies and berries.  Drinks mainly water but also endorses drinking sodas, juices and sweet tea. ?Started going to the gym 2 times a week to ride the bicycle and walk on the treadmill. ? ?COPD: Saw pulmonologist Dr. Loanne Drilling since last visit.  Spiriva added to Breo.  Doing well on these inhalers. ?Had nocturnal pulse oximetry through pulmonologist.  Positive for nocturnal hypoxia.  Advise 2 L O2 at night. ?Patient had requested portable oxygen concentrator after last visit with me.  [facility].  Patient states she went there in [facility] and they did what sounds like 6 minutes walk with her and was told that she qualified for portable O2.  She currently has a portable tank but finds it to be too clumsy.  She does not have her O2 with her today for that reason.  She left it in the car.  Uses O2 when she has to do a lot of walking. ? ?Had  seen NP several weeks ago for redness and soreness of the right breast around the nipple.  Diagnosed with mastitis.  Given doxycycline.  Patient called in earlier this month requesting another round of doxycycline which I prescribed.  She reports that the breast is a lot better.  Still a little sore. ?-Reports chronic milk discharge from the nipple when the nipple is squeezed.  States that this has been present ever since she had her last child.  Reports that this was evaluated previously by another physician and she was told that as long as it does not have any blood in it it is okay.   ?Last mammogram 02/2021. ?Family history of breast cancer in her daughter and sister ? ?  ?Patient Active Problem List  ? Diagnosis Date Noted  ? Mastitis, right, acute 12/23/2021  ? Influenza vaccine needed 09/14/2020  ? Tobacco dependence 05/12/2020  ? Aortic atherosclerosis ([place]) 05/12/2020  ? Adenomatous polyp of colon 05/12/2020  ? Langerhan's cell histiocytosis ([place]) 01/26/2018  ? Cigarette smoker 01/26/2018  ? Osteoarthritis of cervical spine 12/22/2017  ? Prediabetes 12/22/2017  ? Pain of left calf 04/06/2017  ? Centrilobular emphysema ([place]) 02/23/2016  ? Obesity (BMI 30-39.9) 01/15/2016  ? Bilateral shoulder pain 01/15/2016  ? Esophageal reflux 01/15/2016  ? Tobacco abuse 05/13/2015  ? Lung nodule 08/14/2014  ? Impaired fasting glucose 08/14/2014  ?  Hyperlipidemia 08/14/2014  ? Personal history of colonic adenomas 06/25/2013  ? HTN (hypertension) 05/20/2013  ?  ? ?Current Outpatient Medications on File Prior to Visit  ?Medication Sig Dispense Refill  ? acetaminophen (TYLENOL 8 HOUR) 650 MG CR tablet Take 1 tablet (650 mg total) by mouth 2 (two) times daily as needed for pain. (Patient not taking: Reported on 08/24/2021) 60 tablet 5  ? albuterol (PROAIR HFA) 108 (90 Base) MCG/ACT inhaler Inhale 2 puffs into the lungs every 4 (four) hours as needed for wheezing or shortness of breath. 8.5 g 0  ? albuterol (PROVENTIL) (2.5  MG/3ML) 0.083% nebulizer solution TAKE 3 MLS BY NEBULIZATION EVERY 6 (SIX) HOURS AS NEEDED FOR WHEEZING OR SHORTNESS OF BREATH. 90 mL 1  ? amLODipine (NORVASC) 10 MG tablet Take 1 tablet (10 mg total) by mouth daily. 30 tablet 3  ? aspirin EC 81 MG tablet Take 1 tablet (81 mg total) by mouth daily. 100 tablet 1  ? atorvastatin (LIPITOR) 20 MG tablet TAKE 1 TABLET (20 MG TOTAL) BY MOUTH DAILY. 90 tablet 1  ? doxycycline (VIBRA-TABS) 100 MG tablet Take 1 tablet (100 mg total) by mouth 2 (two) times daily. 8 tablet 0  ? fluticasone furoate-vilanterol (BREO ELLIPTA) 200-25 MCG/ACT AEPB Inhale 1 puff into the lungs daily. 60 each 5  ? hydrochlorothiazide (MICROZIDE) 12.5 MG capsule TAKE 1 CAPSULE BY MOUTH DAILY. 30 capsule 3  ? omeprazole (PRILOSEC) 20 MG capsule Take 1 capsule (20 mg total) by mouth daily. 30 capsule 3  ? oxybutynin (DITROPAN) 5 MG tablet Take 1 tablet (5 mg total) by mouth 2 (two) times daily. (Patient not taking: Reported on 08/17/2021) 60 tablet 3  ? Respiratory Therapy Supplies (FLUTTER) DEVI Use as directed (Patient not taking: Reported on 08/24/2021) 1 each 0  ? sodium chloride (OCEAN) 0.65 % nasal spray Place 2 sprays into both nostrils every 4 (four) hours. (Patient not taking: Reported on 08/24/2021) 44 mL 0  ? Tiotropium Bromide Monohydrate (SPIRIVA RESPIMAT) 2.5 MCG/ACT AERS Inhale 2 puffs into the lungs daily. 4 g 5  ? ?No current facility-administered medications on file prior to visit.  ? ? ?Allergies  ?Allergen Reactions  ? Sulfa Antibiotics Anaphylaxis and Hives  ? Chantix [Varenicline Tartrate]   ?  Bad dreams  ? Fish Allergy Other (See Comments)  ?  Severe stomach cramps   ? Penicillins Other (See Comments)  ?  Has patient had a PCN reaction causing immediate rash, facial/tongue/throat swelling, SOB or lightheadedness with hypotension: No ?Has patient had a PCN reaction causing severe rash involving mucus membranes or skin necrosis: No ?Has patient had a PCN reaction that required  hospitalization No ?Has patient had a PCN reaction occurring within the last 10 years: No ?If all of the above answers are "NO", then may proceed with Cephalosporin use. ? ?Welts   ? Latex Rash and Other (See Comments)  ?  Dry skin  ? ? ?Social History  ? ?Socioeconomic History  ? Marital status: Married  ?  Spouse name: Not on file  ? Number of children: Not on file  ? Years of education: 12+  ? Highest education level: Not on file  ?Occupational History  ? Occupation: PCA  ?  Employer: [employer]  ?Tobacco Use  ? Smoking status: Former  ?  Packs/day: 0.50  ?  Years: 40.00  ?  Pack years: 20.00  ?  Types: Cigarettes  ? Smokeless tobacco: Never  ? Tobacco comments:  ?  06/30/20 has cut down on cigarettes using gum and occasional vaping  ?  Patient has not smoked in 4 months- 12/23/21- Leodis Sias L RMA  ?Vaping Use  ? Vaping Use: Every day  ?Substance and Sexual Activity  ? Alcohol use: Yes  ?  Comment: occasional  ? Drug use: No  ? Sexual activity: Not Currently  ?Other Topics Concern  ? Not on file  ?Social History Narrative  ? Regular exercise-no Caffeine Use-  ? ?Social Determinants of Health  ? ?Financial Resource Strain: Not on file  ?Food Insecurity: Not on file  ?Transportation Needs: Not on file  ?Physical Activity: Not on file  ?Stress: Not on file  ?Social Connections: Not on file  ?Intimate Partner Violence: Not on file  ? ? ?Family History  ?Problem Relation Age of Onset  ? Cancer Father   ?     prostate  ? Hypertension Mother   ? Diabetes Mother   ? Hypertension Sister   ? Heart disease Maternal Uncle   ? Stroke Maternal Grandmother   ? Hypertension Maternal Grandmother   ? Hypertension Sister   ? Colon cancer Neg Hx   ? Stomach cancer Neg Hx   ? Rectal cancer Neg Hx   ? Colon polyps Neg Hx   ? ? ?Past Surgical History:  ?Procedure Laterality Date  ? ABDOMINAL HYSTERECTOMY  85yr ago  ? due to heavy bleeding and fibroids   ? COLONOSCOPY    ? HEMORRHOID BANDING  2014  ? HEMORRHOID SURGERY  89[facility][age]  ?  UMBILICAL HERNIA REPAIR  06/2015  ? ? ?ROS: ?Review of Systems ?Negative except as stated above ? ?PHYSICAL EXAM: ?BP 138/82   Pulse 62   Resp 16   Wt 271 lb 12.8 oz (123.3 kg)   SpO2 94%   BMI 40.14 kg/m?   ?Wt Readin

## 2022-01-11 NOTE — Patient Instructions (Signed)
Decrease amlodipine to 5 mg daily.  Increase hydrochlorothiazide to 25 mg daily. ? ?I have referred you for your mammogram. ? ?I referred you to a nutritionist. ? ?I will have our caseworker check with [facility] about your portable oxygen. ? ?Healthy Eating ?Following a healthy eating pattern may help you to achieve and maintain a healthy body weight, reduce the risk of chronic disease, and live a long and productive life. It is important to follow a healthy eating pattern at an appropriate calorie level for your body. Your nutritional needs should be met primarily through food by choosing a variety of nutrient-rich foods. ?What are tips for following this plan? ?Reading food labels ?Read labels and choose the following: ?Reduced or low sodium. ?Juices with 100% fruit juice. ?Foods with low saturated fats and high polyunsaturated and monounsaturated fats. ?Foods with whole grains, such as whole wheat, cracked wheat, brown rice, and wild rice. ?Whole grains that are fortified with folic acid. This is recommended for women who are pregnant or who want to become pregnant. ?Read labels and avoid the following: ?Foods with a lot of added sugars. These include foods that contain brown sugar, corn sweetener, corn syrup, dextrose, fructose, glucose, high-fructose corn syrup, honey, invert sugar, lactose, malt syrup, maltose, molasses, raw sugar, sucrose, trehalose, or turbinado sugar. ?Do not eat more than the following amounts of added sugar per day: ?6 teaspoons (25 g) for women. ?9 teaspoons (38 g) for men. ?Foods that contain processed or refined starches and grains. ?Refined grain products, such as white flour, degermed cornmeal, white bread, and white rice. ?Shopping ?Choose nutrient-rich snacks, such as vegetables, whole fruits, and nuts. Avoid high-calorie and high-sugar snacks, such as potato chips, fruit snacks, and candy. ?Use oil-based dressings and spreads on foods instead of solid fats such as butter, stick  margarine, or cream cheese. ?Limit pre-made sauces, mixes, and "instant" products such as flavored rice, instant noodles, and ready-made pasta. ?Try more plant-protein sources, such as tofu, tempeh, black beans, edamame, lentils, nuts, and seeds. ?Explore eating plans such as the Mediterranean diet or vegetarian diet. ?Cooking ?Use oil to saut? or stir-fry foods instead of solid fats such as butter, stick margarine, or lard. ?Try baking, boiling, grilling, or broiling instead of frying. ?Remove the fatty part of meats before cooking. ?Steam vegetables in water or broth. ?Meal planning ? ?At meals, imagine dividing your plate into fourths: ?One-half of your plate is fruits and vegetables. ?One-fourth of your plate is whole grains. ?One-fourth of your plate is protein, especially lean meats, poultry, eggs, tofu, beans, or nuts. ?Include low-fat dairy as part of your daily diet. ?Lifestyle ?Choose healthy options in all settings, including home, work, school, restaurants, or stores. ?Prepare your food safely: ?Wash your hands after handling raw meats. ?Keep food preparation surfaces clean by regularly washing with hot, soapy water. ?Keep raw meats separate from ready-to-eat foods, such as fruits and vegetables. ?Cook seafood, meat, poultry, and eggs to the recommended internal temperature. ?Store foods at safe temperatures. In general: ?Keep cold foods at 40?F (4.4?C) or below. ?Keep hot foods at 140?F (60?C) or above. ?Keep your freezer at 0?F (-17.8?C) or below. ?Foods are no longer safe to eat when they have been between the temperatures of 40?-140?F (4.4-60?C) for more than 2 hours. ?What foods should I eat? ?Fruits ?Aim to eat 2 cup-equivalents of fresh, canned (in natural juice), or frozen fruits each day. Examples of 1 cup-equivalent of fruit include 1 small apple, 8 large strawberries, 1  cup canned fruit, ? cup dried fruit, or 1 cup 100% juice. ?Vegetables ?Aim to eat 2?-3 cup-equivalents of fresh and frozen  vegetables each day, including different varieties and colors. Examples of 1 cup-equivalent of vegetables include 2 medium carrots, 2 cups raw, leafy greens, 1 cup chopped vegetable (raw or cooked), or 1 medium baked potato. ?Grains ?Aim to eat 6 ounce-equivalents of whole grains each day. Examples of 1 ounce-equivalent of grains include 1 slice of bread, 1 cup ready-to-eat cereal, 3 cups popcorn, or ? cup cooked rice, pasta, or cereal. ?Meats and other proteins ?Aim to eat 5-6 ounce-equivalents of protein each day. Examples of 1 ounce-equivalent of protein include 1 egg, 1/2 cup nuts or seeds, or 1 tablespoon (16 g) peanut butter. A cut of meat or fish that is the size of a deck of cards is about 3-4 ounce-equivalents. ?Of the protein you eat each week, try to have at least 8 ounces come from seafood. This includes salmon, trout, herring, and anchovies. ?Dairy ?Aim to eat 3 cup-equivalents of fat-free or low-fat dairy each day. Examples of 1 cup-equivalent of dairy include 1 cup (240 mL) milk, 8 ounces (250 g) yogurt, 1? ounces (44 g) natural cheese, or 1 cup (240 mL) fortified soy milk. ?Fats and oils ?Aim for about 5 teaspoons (21 g) per day. Choose monounsaturated fats, such as canola and olive oils, avocados, peanut butter, and most nuts, or polyunsaturated fats, such as sunflower, corn, and soybean oils, walnuts, pine nuts, sesame seeds, sunflower seeds, and flaxseed. ?Beverages ?Aim for six 8-oz glasses of water per day. Limit coffee to three to five 8-oz cups per day. ?Limit caffeinated beverages that have added calories, such as soda and energy drinks. ?Limit alcohol intake to no more than 1 drink a day for nonpregnant women and 2 drinks a day for men. One drink equals 12 oz of beer (355 mL), 5 oz of wine (148 mL), or 1? oz of hard liquor (44 mL). ?Seasoning and other foods ?Avoid adding excess amounts of salt to your foods. Try flavoring foods with herbs and spices instead of salt. ?Avoid adding sugar to  foods. ?Try using oil-based dressings, sauces, and spreads instead of solid fats. ?This information is based on general [place] nutrition guidelines. For more information, visit Build[employer].es. Exact amounts may vary based on your nutrition needs. ?Summary ?A healthy eating plan may help you to maintain a healthy weight, reduce the risk of chronic diseases, and stay active throughout your life. ?Plan your meals. Make sure you eat the right portions of a variety of nutrient-rich foods. ?Try baking, boiling, grilling, or broiling instead of frying. ?Choose healthy options in all settings, including home, work, school, restaurants, or stores. ?This information is not intended to replace advice given to you by your health care provider. Make sure you discuss any questions you have with your health care provider. ?Document Revised: 06/15/2021 Document Reviewed: 06/15/2021 ?Elsevier Patient Education ? [place]. ? ?

## 2022-01-12 ENCOUNTER — Encounter: Payer: Self-pay | Admitting: Internal Medicine

## 2022-01-12 ENCOUNTER — Other Ambulatory Visit: Payer: Self-pay | Admitting: Internal Medicine

## 2022-01-12 DIAGNOSIS — N644 Mastodynia: Secondary | ICD-10-CM

## 2022-01-12 DIAGNOSIS — N6452 Nipple discharge: Secondary | ICD-10-CM

## 2022-01-12 LAB — BRAIN NATRIURETIC PEPTIDE: BNP: 10.3 pg/mL (ref 0.0–100.0)

## 2022-01-14 ENCOUNTER — Other Ambulatory Visit: Payer: Self-pay | Admitting: *Deleted

## 2022-01-14 DIAGNOSIS — Z87891 Personal history of nicotine dependence: Secondary | ICD-10-CM

## 2022-01-19 ENCOUNTER — Other Ambulatory Visit: Payer: Self-pay | Admitting: Internal Medicine

## 2022-01-19 MED ORDER — ALBUTEROL SULFATE HFA 108 (90 BASE) MCG/ACT IN AERS
2.0000 | INHALATION_SPRAY | RESPIRATORY_TRACT | 2 refills | Status: DC | PRN
  Filled 2022-01-19: qty 8.5, 16d supply, fill #0

## 2022-01-20 ENCOUNTER — Other Ambulatory Visit (HOSPITAL_COMMUNITY): Payer: Self-pay

## 2022-01-21 DIAGNOSIS — R0602 Shortness of breath: Secondary | ICD-10-CM | POA: Diagnosis not present

## 2022-01-24 ENCOUNTER — Ambulatory Visit: Payer: Self-pay | Admitting: *Deleted

## 2022-01-24 NOTE — Telephone Encounter (Signed)
?  Chief Complaint: feet swelling ?Symptoms: swollen, not painful ?Frequency: constant ?Pertinent Negatives: Patient denies pain ?Disposition: '[]'$ ED /'[]'$ Urgent Care (no appt availability in office) / '[]'$ Appointment(In office/virtual)/ '[]'$  Day Virtual Care/ '[]'$ Home Care/ '[]'$ Refused Recommended Disposition /'[]'$ [facility]/ '[x]'$  Follow-up with PCP ?Additional Notes: Pt was seen for swelling ankles/feet and MD made medication changes, calling back to say has worsened since medication changed, questioning what to do. ? ? ?Reason for Disposition ? [1] Very swollen ankle AND [2] no fever ? ?Answer Assessment - Initial Assessment Questions ?1. LOCATION: "Which ankle is swollen?" "Where is the swelling?" ?    both ?2. ONSET: "When did the swelling start?" ?    01/11/22 since  changed medication ?3. SIZE: "How large is the swelling?" ?    A lot of swelling  ?4. PAIN: "Is there any pain?" If Yes, ask: "How bad is it?" (Scale 1-10; or mild, moderate, severe) ?  - NONE (0): no pain. ?  - MILD (1-3): doesn't interfere with normal activities.  ?  - MODERATE (4-7): interferes with normal activities (e.g., work or school) or awakens from sleep, limping.  ?  - SEVERE (8-10): excruciating pain, unable to do any normal activities, unable to walk.  ?    Not really  ?5. CAUSE: "What do you think caused the ankle swelling?" ?    Change in medication ?6. OTHER SYMPTOMS: "Do you have any other symptoms?" (e.g., fever, chest pain, difficulty breathing, calf pain) ?    no ?7. PREGNANCY: "Is there any chance you are pregnant?" "When was your last menstrual period?" ?    na ? ?Protocols used: Ankle Swelling-A-AH ? ?

## 2022-01-25 ENCOUNTER — Encounter: Payer: Self-pay | Admitting: Nurse Practitioner

## 2022-01-25 ENCOUNTER — Telehealth (HOSPITAL_BASED_OUTPATIENT_CLINIC_OR_DEPARTMENT_OTHER): Payer: Medicare Other | Admitting: Nurse Practitioner

## 2022-01-25 ENCOUNTER — Telehealth: Payer: Self-pay | Admitting: *Deleted

## 2022-01-25 DIAGNOSIS — R600 Localized edema: Secondary | ICD-10-CM | POA: Diagnosis not present

## 2022-01-25 DIAGNOSIS — I10 Essential (primary) hypertension: Secondary | ICD-10-CM | POA: Diagnosis not present

## 2022-01-25 MED ORDER — CHLORTHALIDONE 25 MG PO TABS
25.0000 mg | ORAL_TABLET | Freq: Every day | ORAL | 1 refills | Status: DC
  Filled 2022-01-25: qty 30, 30d supply, fill #0

## 2022-01-25 MED ORDER — MEDICAL COMPRESSION STOCKINGS MISC: 0 refills | Status: DC

## 2022-01-25 NOTE — Telephone Encounter (Signed)
Sent pt a MyChart message

## 2022-01-25 NOTE — Telephone Encounter (Signed)
Call placed to [facility] to check on status of POC order. Spoke to [employer] who stated that since the patient has been on service with them for more than 45 days, she does not qualify for a POC. She said that they called and explained this to the patient and informed her that she would need to pay out of pocket and she said she did not want to pay.  ? ?Call placed to patient and explained above information. She said that she has never heard from [facility] despite multiple calls she made to check on this order. She said she went to the [facility] office shortly after the order was submitted and was tested and told that she qualified for the POC. Informed her that this CM would contact [facility] representative to inquire this order. ? ?Email sent to [facility] requesting assistance with determining the status of this order.  ?

## 2022-01-25 NOTE — Addendum Note (Signed)
Addended by: Geryl Rankins on: 01/25/2022 06:40 PM ? ? Modules accepted: Orders ? ?

## 2022-01-25 NOTE — Progress Notes (Addendum)
Virtual Visit Note ?Due to national recommendations of social distancing due to [place] 19, virtual visit is felt to be most appropriate for this patient at this time.  I discussed the limitations, risks, security and privacy concerns of performing an evaluation and management service by video and the availability of in person appointments. I also discussed with the patient that there may be a patient responsible charge related to this service. The patient expressed understanding and agreed to proceed.  ? ? ?I connected with Krystal Allen on 01/25/22  at   3:30 PM EDT  EDT by VIDEO and verified that I am speaking with the correct person using two identifiers. ? ? ?Location of Patient: ?Private Residence ?  ?Location of Provider: ?Scientist, research (physical sciences) and [employer] Office  ?  ?Persons participating in VIRTUAL visit: ?Krystal Rankins FNP-BC ?Krystal Allen  ?  ?History of Present Illness: ?VIRTUAL visit for: HTN ?She has a past medical history of Allergy, COPD, Depression, GERD, Hernia, abdominal,  Hyperlipidemia, Hypertension, Internal hemorrhoids with Grade 3 prolapse and bleeding (06/19/2013), Osteoarthritis, and Personal history of colonic adenomas (06/25/2013).  ? ?She endorses BLE pedal edema which has been unchanged since her PCP decreased her amlodipine from '10mg'$  to 5 mg and increased her HCTZ from 12.5 mg to 25 mg daily on 01-11-2022. At that time she had concerns of pedal edema which was worsening throughout the day. Now today she states since her medications were changed she has not noticed any improvement in her pedal edema. Her most recent BNP was normal and we discussed this as well. I instructed her that the edema is likely from dietary non adherence. She adamantly denies poor diet but then precedes to tell me she had a pot pie from [employer] last night. Will switch current thiazide to chlorthalidone 25 mg. Patient is aware.  ? ? ? ?Past Medical History:  ?Diagnosis Date  ? Allergy   ? COPD  (chronic obstructive pulmonary disease) ([place])   ? Depression   ? GERD (gastroesophageal reflux disease)   ? Hernia, abdominal   ? History of chicken pox   ? Hyperlipidemia   ? Hypertension   ? Internal hemorrhoids with Grade 3 prolapse and bleeding 06/19/2013  ? Osteoarthritis   ? Personal history of colonic adenomas 06/25/2013  ?  ?Past Surgical History:  ?Procedure Laterality Date  ? ABDOMINAL HYSTERECTOMY  9yr ago  ? due to heavy bleeding and fibroids   ? COLONOSCOPY    ? HEMORRHOID BANDING  2014  ? HEMORRHOID SURGERY  89[address]  ? UMBILICAL HERNIA REPAIR  06/2015  ?  ?Family History  ?Problem Relation Age of Onset  ? Cancer Father   ?     prostate  ? Hypertension Mother   ? Diabetes Mother   ? Hypertension Sister   ? Heart disease Maternal Uncle   ? Stroke Maternal Grandmother   ? Hypertension Maternal Grandmother   ? Hypertension Sister   ? Colon cancer Neg Hx   ? Stomach cancer Neg Hx   ? Rectal cancer Neg Hx   ? Colon polyps Neg Hx   ?  ?Social History  ? ?Socioeconomic History  ? Marital status: Married  ?  Spouse name: Not on file  ? Number of children: Not on file  ? Years of education: 12+  ? Highest education level: Not on file  ?Occupational History  ? Occupation: PCA  ?  Employer: [employer]?Tobacco Use  ? Smoking status: Former  ?  Packs/day: 0.50  ?  Years: 40.00  ?  Pack years: 20.00  ?  Types: Cigarettes  ? Smokeless tobacco: Never  ? Tobacco comments:  ?  06/30/20 has cut down on cigarettes using gum and occasional vaping  ?  Patient has not smoked in 4 months- 12/23/21- Leodis Sias L RMA  ?Vaping Use  ? Vaping Use: Every day  ?Substance and Sexual Activity  ? Alcohol use: Yes  ?  Comment: occasional  ? Drug use: No  ? Sexual activity: Not Currently  ?Other Topics Concern  ? Not on file  ?Social History Narrative  ? Regular exercise-no Caffeine Use-  ? ?Social Determinants of Health  ? ?Financial Resource Strain: Not on file  ?Food Insecurity: Not on file  ?Transportation Needs: Not on file  ?Physical  Activity: Not on file  ?Stress: Not on file  ?Social Connections: Not on file  ?  ? ?Observations/Objective: ?Awake, alert and oriented x 3 ? ? ?Review of Systems  ?Constitutional:  Negative for fever, malaise/fatigue and weight loss.  ?HENT: Negative.  Negative for nosebleeds.   ?Eyes: Negative.  Negative for blurred vision, double vision and photophobia.  ?Respiratory: Negative.  Negative for cough, shortness of breath and wheezing.   ?Cardiovascular:  Positive for leg swelling. Negative for chest pain and palpitations.  ?Gastrointestinal: Negative.  Negative for heartburn, nausea and vomiting.  ?Musculoskeletal: Negative.  Negative for myalgias.  ?Neurological: Negative.  Negative for dizziness, focal weakness, seizures and headaches.  ?Psychiatric/Behavioral: Negative.  Negative for suicidal ideas.    ?Assessment and Plan: ?Diagnoses and all orders for this visit: ? ?Primary hypertension ?-     chlorthalidone (HYGROTON) 25 MG tablet; Take 1 tablet (25 mg total) by mouth daily. ?Log BP readings several times per week. Call if diastolic under 892.  ?Needs BMP in 1 week.  ?Wear compression stockings daily.  ?Continue amlodipine as prescribed.  ?  ? ?Follow Up Instructions ?Return in about 1 week (around 02/01/2022) for BMP.  ? ?  ?I discussed the assessment and treatment plan with the patient. The patient was provided an opportunity to ask questions and all were answered. The patient agreed with the plan and demonstrated an understanding of the instructions. ?  ?The patient was advised to call back or seek an in-person evaluation if the symptoms worsen or if the condition fails to improve as anticipated. ? ?I provided 13 minutes of face-to-face time during this encounter including median intraservice time, reviewing previous notes, labs, imaging, medications and explaining diagnosis and management. ? ?Gildardo Pounds, FNP-BC  ?

## 2022-01-25 NOTE — Telephone Encounter (Signed)
Notified the NP Raul Del has virtual visits today, appt made for 330 to discuss swelling and med changes. ?

## 2022-01-25 NOTE — Telephone Encounter (Signed)
Pt called yesterday to ask what to do about her medications. Dr. Wynetta Emery changed her medications due to feet and ankles swelling and they are worse with the new medication regime started 01/11/22 which was decrease in amlodipine and increase in HCTZ. Has upcoming appt 03/08/22, wants to know what to do about her medications until she comes in for her appt. ?

## 2022-01-26 ENCOUNTER — Other Ambulatory Visit: Payer: Self-pay

## 2022-01-26 NOTE — Telephone Encounter (Signed)
Message received from [facility]: ? ?Looks like this patient came in for her Best Fit portable O2 evaluation and passed with a recommendation for ML6 tanks with a conserving device for portability.  We will reach out to her and make sure she is aware of this and make arrangements for her to come pick them up.  ? ?It is our policy to only provide POCs to brand new, new to oxygen patients, through insurance.  Patients that have been on O2 for over 45 days have the option of private paying for a POC or going with ML6 tanks (which are the same size and weight as a POC).     ?

## 2022-01-28 ENCOUNTER — Ambulatory Visit (INDEPENDENT_AMBULATORY_CARE_PROVIDER_SITE_OTHER): Payer: Medicare Other | Admitting: Acute Care

## 2022-01-28 ENCOUNTER — Encounter: Payer: Self-pay | Admitting: Acute Care

## 2022-01-28 DIAGNOSIS — Z87891 Personal history of nicotine dependence: Secondary | ICD-10-CM | POA: Diagnosis not present

## 2022-01-28 NOTE — Patient Instructions (Signed)
Thank you for participating in the [facility]. It was our pleasure to meet you today. We will call you with the results of your scan within the next few days. Your scan will be assigned a Lung RADS category score by the physicians reading the scans.  This Lung RADS score determines follow up scanning.  See below for description of categories, and follow up screening recommendations. We will be in touch to schedule your follow up screening annually or based on recommendations of our providers. We will fax a copy of your scan results to your Primary Care Physician, or the physician who referred you to the program, to ensure they have the results. Please call the office if you have any questions or concerns regarding your scanning experience or results.  Our office number is [phone]. Please speak with Denise Phelps, RN. , or  Denise Buckner RN, They are  our Lung Cancer Screening RN.'s If They are unavailable when you call, Please leave a message on the voice mail. We will return your call at our earliest convenience.This voice mail is monitored several times a day.  Remember, if your scan is normal, we will scan you annually as long as you continue to meet the criteria for the program. (Age 55-77, Current smoker or smoker who has quit within the last 15 years). If you are a smoker, remember, quitting is the single most powerful action that you can take to decrease your risk of lung cancer and other pulmonary, breathing related problems. We know quitting is hard, and we are here to help.  Please let us know if there is anything we can do to help you meet your goal of quitting. If you are a former smoker, congratulations. We are proud of you! Remain smoke free! Remember you can refer friends or family members through the number above.  We will screen them to make sure they meet criteria for the program. Thank you for helping us take better care of you by  participating in Lung Screening.  You can receive free nicotine replacement therapy ( patches, gum or mints) by calling 1-800-QUIT NOW. Please call so we can get you on the path to becoming  a non-smoker. I know it is hard, but you can do this!  Lung RADS Categories:  Lung RADS 1: no nodules or definitely non-concerning nodules.  Recommendation is for a repeat annual scan in 12 months.  Lung RADS 2:  nodules that are non-concerning in appearance and behavior with a very low likelihood of becoming an active cancer. Recommendation is for a repeat annual scan in 12 months.  Lung RADS 3: nodules that are probably non-concerning , includes nodules with a low likelihood of becoming an active cancer.  Recommendation is for a 6-month repeat screening scan. Often noted after an upper respiratory illness. We will be in touch to make sure you have no questions, and to schedule your 6-month scan.  Lung RADS 4 A: nodules with concerning findings, recommendation is most often for a follow up scan in 3 months or additional testing based on our provider's assessment of the scan. We will be in touch to make sure you have no questions and to schedule the recommended 3 month follow up scan.  Lung RADS 4 B:  indicates findings that are concerning. We will be in touch with you to schedule additional diagnostic testing based on our provider's  assessment of the scan.  Other options for assistance in smoking cessation (   As covered by your insurance benefits)  Hypnosis for smoking cessation  [employer] [phone]  Acupuncture for smoking cessation  [facility] [phone]   

## 2022-01-28 NOTE — Progress Notes (Signed)
Virtual Visit via Telephone Note ? ?I connected with Krystal Allen on 09/14/21 at  2:00 PM EST by telephone and verified that I am speaking with the correct person using two identifiers. ? ?Location: ?Patient: Home ?Provider: Working from home ?  ?I discussed the limitations, risks, security and privacy concerns of performing an evaluation and management service by telephone and the availability of in person appointments. I also discussed with the patient that there may be a patient responsible charge related to this service. The patient expressed understanding and agreed to proceed. ? ?Shared Decision Making Visit Lung Cancer Screening Program ?([phone]) ? ? ?Eligibility: ?Age 66 y.o. ?Pack Years Smoking History Calculation 20 ?(# packs/per year x # years smoked) ?Recent History of coughing up blood  no ?Unexplained weight loss? no ?( >Than 15 pounds within the last 6 months ) ?Prior History Lung / other cancer no ?(Diagnosis within the last 5 years already requiring surveillance chest CT Scans). ?Smoking Status Former Smoker ?Former Smokers: Years since quit: < 1 year ? Quit Date: 07/2021 ? ?Visit Components: ?Discussion included one or more decision making aids. yes ?Discussion included risk/benefits of screening. yes ?Discussion included potential follow up diagnostic testing for abnormal scans. yes ?Discussion included meaning and risk of over diagnosis. yes ?Discussion included meaning and risk of False Positives. yes ?Discussion included meaning of total radiation exposure. yes ? ?Counseling Included: ?Importance of adherence to annual lung cancer LDCT screening. yes ?Impact of comorbidities on ability to participate in the program. yes ?Ability and willingness to under diagnostic treatment. yes ? ?Smoking Cessation Counseling: ?Current Smokers:  ?Discussed importance of smoking cessation. yes ?Information about tobacco cessation classes and interventions provided to patient. yes ?Patient provided with  "ticket" for LDCT Scan. yes ?Symptomatic Patient. no ? Counseling NA ?Diagnosis Code: Tobacco Use Z72.0 ?Asymptomatic Patient yes ? Counseling (Intermediate counseling: > three minutes counseling) J2426 ?Former Smokers:  ?Discussed the importance of maintaining cigarette abstinence. yes ?Diagnosis Code: Personal History of Nicotine Dependence. S34.196 ?Information about tobacco cessation classes and interventions provided to patient. Yes ?Patient provided with "ticket" for LDCT Scan. yes ?Written Order for Lung Cancer Screening with LDCT placed in Epic. Yes ?(CT Chest Lung Cancer Screening Low Dose W/O CM) [id number] ?Z12.2-Screening of respiratory organs ?Z87.891-Personal history of nicotine dependence ? ? ?I spent 25 minutes of face to face time with her discussing the risks and benefits of lung cancer screening. We viewed a power point together that explained in detail the above noted topics. We took the time to pause the power point at intervals to allow for questions to be asked and answered to ensure understanding. We discussed that she had taken the single most powerful action possible to decrease her risk of developing lung cancer when she quit smoking. I counseled her to remain smoke free, and to contact me if she ever had the desire to smoke again so that I can provide resources and tools to help support the effort to remain smoke free. We discussed the time and location of the scan, and that either  Doroteo Glassman RN or I will call with the results within  24-48 hours of receiving them. She has my card and contact information in the event she needs to speak with me, in addition to a copy of the power point we reviewed as a resource. She verbalized understanding of all of the above and had no further questions upon leaving the office.  ? ? ? ?I explained to the patient that  there has been a high incidence of coronary artery disease noted on these exams. I explained that this is a non-gated exam therefore  degree or severity cannot be determined. This patient is on statin therapy. I have asked the patient to follow-up with their PCP regarding any incidental finding of coronary artery disease and management with diet or medication as they feel is clinically indicated. The patient verbalized understanding of the above and had no further questions. ? ? ?I spent 3 minutes counseling on smoking cessation and the health risks of continued tobacco abuse  ? ? ?Brendin Situ D. Harris, NP-C ? Pulmonary & Critical Care ?Personal contact information can be found on Amion  ?01/28/2022, 10:11 AM ? ? ? ? ? ? ? ? ? ?

## 2022-01-31 ENCOUNTER — Ambulatory Visit: Payer: Self-pay

## 2022-01-31 NOTE — Telephone Encounter (Signed)
?  Chief Complaint: medication reaction ?Symptoms: itching and red spots on back  ?Frequency: since 01/25/22 when medication was switched ?Pertinent Negatives: NA ?Disposition: '[]'$ ED /'[]'$ Urgent Care (no appt availability in office) / '[x]'$ Appointment(In office/virtual)/ '[]'$  [facility]/ '[]'$ Home Care/ '[]'$ Refused Recommended Disposition /'[]'$ [facility]/ '[]'$  Follow-up with PCP ?Additional Notes: Pt states she has taken benadryl and using coconut oil on her back. The red splotches came up today. Pt states she also has runny nose, advised her to get some different allergy medicine OTC.  ?Summary: medication reaction  ? Pt was prescribed a new BP medication and now after taking has red splotches on her back / Pt thinks it is from taking chlorthalidone (HYGROTON) 25 MG tablet/ pt has a mychart visit for tomorrow afternoon / please advise   ?  ? ?Reason for Disposition ? [1] Caller has NON-URGENT medicine question about med that PCP prescribed AND [2] triager unable to answer question ? ?Answer Assessment - Initial Assessment Questions ?1. NAME of MEDICATION: "What medicine are you calling about?" ?    hygroton ?2. QUESTION: "What is your question?" (e.g., double dose of medicine, side effect) ?    Itching and red splotches on back.  ?3. PRESCRIBING HCP: "Who prescribed it?" Reason: if prescribed by specialist, call should be referred to that group. ?    Dr. Wynetta Emery  ?4. SYMPTOMS: "Do you have any symptoms?" ?    Itching and red splotches ? ?Protocols used: Medication Question Call-A-AH ? ?

## 2022-02-01 ENCOUNTER — Encounter: Payer: Self-pay | Admitting: Emergency Medicine

## 2022-02-01 ENCOUNTER — Other Ambulatory Visit: Payer: Self-pay

## 2022-02-01 ENCOUNTER — Ambulatory Visit
Admission: RE | Admit: 2022-02-01 | Discharge: 2022-02-01 | Disposition: A | Discharge status: Home / Self Care | Payer: Medicare Other | Source: Ambulatory Visit | Attending: Internal Medicine | Admitting: Internal Medicine

## 2022-02-01 ENCOUNTER — Emergency Department (HOSPITAL_COMMUNITY)
Admission: EM | Admit: 2022-02-01 | Discharge: 2022-02-01 | Disposition: A | Discharge status: Home / Self Care | Payer: Medicare Other | Attending: Emergency Medicine | Admitting: Emergency Medicine

## 2022-02-01 ENCOUNTER — Ambulatory Visit
Admission: EM | Admit: 2022-02-01 | Discharge: 2022-02-01 | Disposition: A | Discharge status: Home / Self Care | Payer: Medicare Other | Attending: Internal Medicine | Admitting: Internal Medicine

## 2022-02-01 ENCOUNTER — Emergency Department (HOSPITAL_COMMUNITY): Payer: Medicare Other

## 2022-02-01 ENCOUNTER — Other Ambulatory Visit: Payer: Self-pay | Admitting: Internal Medicine

## 2022-02-01 ENCOUNTER — Encounter (HOSPITAL_COMMUNITY): Payer: Self-pay

## 2022-02-01 ENCOUNTER — Encounter: Payer: Medicare Other | Admitting: Nurse Practitioner

## 2022-02-01 DIAGNOSIS — N644 Mastodynia: Secondary | ICD-10-CM | POA: Diagnosis not present

## 2022-02-01 DIAGNOSIS — R072 Precordial pain: Secondary | ICD-10-CM | POA: Insufficient documentation

## 2022-02-01 DIAGNOSIS — Z7982 Long term (current) use of aspirin: Secondary | ICD-10-CM | POA: Diagnosis not present

## 2022-02-01 DIAGNOSIS — L500 Allergic urticaria: Secondary | ICD-10-CM | POA: Diagnosis not present

## 2022-02-01 DIAGNOSIS — R079 Chest pain, unspecified: Secondary | ICD-10-CM | POA: Diagnosis not present

## 2022-02-01 DIAGNOSIS — N6452 Nipple discharge: Secondary | ICD-10-CM

## 2022-02-01 DIAGNOSIS — R0789 Other chest pain: Secondary | ICD-10-CM | POA: Diagnosis not present

## 2022-02-01 DIAGNOSIS — T7840XA Allergy, unspecified, initial encounter: Secondary | ICD-10-CM

## 2022-02-01 DIAGNOSIS — Z79899 Other long term (current) drug therapy: Secondary | ICD-10-CM | POA: Insufficient documentation

## 2022-02-01 DIAGNOSIS — Z91040 Latex allergy status: Secondary | ICD-10-CM | POA: Insufficient documentation

## 2022-02-01 DIAGNOSIS — L509 Urticaria, unspecified: Secondary | ICD-10-CM | POA: Diagnosis present

## 2022-02-01 DIAGNOSIS — R600 Localized edema: Secondary | ICD-10-CM

## 2022-02-01 DIAGNOSIS — R922 Inconclusive mammogram: Secondary | ICD-10-CM | POA: Diagnosis not present

## 2022-02-01 LAB — CBC WITH DIFFERENTIAL/PLATELET
Abs Immature Granulocytes: 0.01 10*3/uL (ref 0.00–0.07)
Basophils Absolute: 0 10*3/uL (ref 0.0–0.1)
Basophils Relative: 0 %
Eosinophils Absolute: 0 10*3/uL (ref 0.0–0.5)
Eosinophils Relative: 0 %
HCT: 48.4 % — ABNORMAL HIGH (ref 36.0–46.0)
Hemoglobin: 15.8 g/dL — ABNORMAL HIGH (ref 12.0–15.0)
Immature Granulocytes: 0 %
Lymphocytes Relative: 18 %
Lymphs Abs: 1.3 10*3/uL (ref 0.7–4.0)
MCH: 29 pg (ref 26.0–34.0)
MCHC: 32.6 g/dL (ref 30.0–36.0)
MCV: 88.8 fL (ref 80.0–100.0)
Monocytes Absolute: 0.3 10*3/uL (ref 0.1–1.0)
Monocytes Relative: 4 %
Neutro Abs: 5.7 10*3/uL (ref 1.7–7.7)
Neutrophils Relative %: 78 %
Platelets: 238 10*3/uL (ref 150–400)
RBC: 5.45 MIL/uL — ABNORMAL HIGH (ref 3.87–5.11)
RDW: 17 % — ABNORMAL HIGH (ref 11.5–15.5)
WBC: 7.3 10*3/uL (ref 4.0–10.5)
nRBC: 0 % (ref 0.0–0.2)

## 2022-02-01 LAB — URINALYSIS, ROUTINE W REFLEX MICROSCOPIC
Bilirubin Urine: NEGATIVE
Glucose, UA: NEGATIVE mg/dL
Hgb urine dipstick: NEGATIVE
Ketones, ur: NEGATIVE mg/dL
Nitrite: NEGATIVE
Protein, ur: 30 mg/dL — AB
Specific Gravity, Urine: 1.015 (ref 1.005–1.030)
pH: 5 (ref 5.0–8.0)

## 2022-02-01 LAB — COMPREHENSIVE METABOLIC PANEL
ALT: 16 U/L (ref 0–44)
AST: 18 U/L (ref 15–41)
Albumin: 3.6 g/dL (ref 3.5–5.0)
Alkaline Phosphatase: 111 U/L (ref 38–126)
Anion gap: 12 (ref 5–15)
BUN: 23 mg/dL (ref 8–23)
CO2: 26 mmol/L (ref 22–32)
Calcium: 9.8 mg/dL (ref 8.9–10.3)
Chloride: 98 mmol/L (ref 98–111)
Creatinine, Ser: 1.91 mg/dL — ABNORMAL HIGH (ref 0.44–1.00)
GFR, Estimated: 29 mL/min — ABNORMAL LOW (ref >60)
Glucose, Bld: 124 mg/dL — ABNORMAL HIGH (ref 70–99)
Potassium: 3.9 mmol/L (ref 3.5–5.1)
Sodium: 136 mmol/L (ref 135–145)
Total Bilirubin: 0.7 mg/dL (ref 0.3–1.2)
Total Protein: 8.9 g/dL — ABNORMAL HIGH (ref 6.5–8.1)

## 2022-02-01 LAB — LIPASE, BLOOD: Lipase: 41 U/L (ref 11–51)

## 2022-02-01 LAB — TROPONIN I (HIGH SENSITIVITY)
Troponin I (High Sensitivity): 13 ng/L (ref <18)
Troponin I (High Sensitivity): 7 ng/L (ref <18)

## 2022-02-01 MED ORDER — MORPHINE SULFATE (PF) 4 MG/ML IV SOLN
4.0000 mg | Freq: Once | INTRAVENOUS | Status: AC
  Administered 2022-02-01: 4 mg via INTRAVENOUS
  Filled 2022-02-01: qty 1

## 2022-02-01 MED ORDER — FAMOTIDINE IN NACL 20-0.9 MG/50ML-% IV SOLN
20.0000 mg | Freq: Once | INTRAVENOUS | Status: AC
  Administered 2022-02-01: 20 mg via INTRAVENOUS
  Filled 2022-02-01: qty 50

## 2022-02-01 MED ORDER — SODIUM CHLORIDE 0.9 % IV BOLUS
500.0000 mL | Freq: Once | INTRAVENOUS | Status: AC
Start: 2022-02-01 — End: 2022-02-01
  Administered 2022-02-01: 500 mL via INTRAVENOUS

## 2022-02-01 MED ORDER — METHYLPREDNISOLONE SODIUM SUCC 125 MG IJ SOLR
80.0000 mg | Freq: Once | INTRAMUSCULAR | Status: AC
Start: 2022-02-01 — End: 2022-02-01
  Administered 2022-02-01: 80 mg via INTRAMUSCULAR

## 2022-02-01 MED ORDER — DIPHENHYDRAMINE HCL 50 MG/ML IJ SOLN
25.0000 mg | Freq: Once | INTRAMUSCULAR | Status: AC
  Administered 2022-02-01: 25 mg via INTRAVENOUS
  Filled 2022-02-01: qty 1

## 2022-02-01 NOTE — ED Triage Notes (Signed)
Pt here for allergic reaction with hives and throat tightness x 2 days since starting a new htn med ?

## 2022-02-01 NOTE — ED Provider Notes (Signed)
?[facility] ?Provider Note ? ? ?CSN: [id number] ?Arrival date & time: 02/01/22  1319 ? ?  ? ?History ? ?Chief Complaint  ?Patient presents with  ? Urticaria  ? Facial Swelling  ? ? ?Krystal Allen is a 66 y.o. female for evaluation of possible allergic reaction.  Started on clonidine 3 days ago for elevated BP.  Noted urticaria on her back.  Initially had a scratchy throat however this has resolved her urticaria did not.  Subsequently seen at urgent care.  Sent here for further evaluation.  Patient still has persistent urticaria, given steroids at urgent care.  She has no intraoral, facial swelling.  No sensation of throat closing, angioedema.  Does admit to some left-sided chest, upper abdominal pain.  No dysuria, hematuria.  No shortness of breath, cough.  No lower extremity swelling.  No PND, orthopnea.  She has never had anything like this previously.  Denies prior history of AAA, dissection.  No numbness or weakness to her lower extremities. ? ?HPI ? ?  ? ?Home Medications ?Prior to Admission medications   ?Medication Sig Start Date End Date Taking? Authorizing Provider  ?acetaminophen (TYLENOL 8 HOUR) 650 MG CR tablet Take 1 tablet (650 mg total) by mouth 2 (two) times daily as needed for pain. ?Patient not taking: Reported on 08/24/2021 04/06/17   Boykin Nearing, MD  ?albuterol (PROAIR HFA) 108 (90 Base) MCG/ACT inhaler Inhale 2 puffs into the lungs every 4 (four) hours as needed for wheezing or shortness of breath. 01/19/22   Ladell Pier, MD  ?albuterol (PROVENTIL) (2.5 MG/3ML) 0.083% nebulizer solution TAKE 3 MLS BY NEBULIZATION EVERY 6 (SIX) HOURS AS NEEDED FOR WHEEZING OR SHORTNESS OF BREATH. 08/20/21   Idamae Schuller, MD  ?amLODipine (NORVASC) 5 MG tablet Take 1 tablet (5 mg total) by mouth daily. 01/11/22   Ladell Pier, MD  ?aspirin EC 81 MG tablet Take 1 tablet (81 mg total) by mouth daily. 05/12/20   Ladell Pier, MD  ?atorvastatin (LIPITOR) 20 MG  tablet TAKE 1 TABLET (20 MG TOTAL) BY MOUTH DAILY. 09/16/21 09/16/22  Ladell Pier, MD  ?chlorthalidone (HYGROTON) 25 MG tablet Take 1 tablet (25 mg total) by mouth daily. 01/25/22   Gildardo Pounds, NP  ?doxycycline (VIBRA-TABS) 100 MG tablet Take 1 tablet (100 mg total) by mouth 2 (two) times daily. 12/31/21   Ladell Pier, MD  ?Elastic Bandages & Supports ([place]) [place] R60.0 01/25/22   Gildardo Pounds, NP  ?fluticasone furoate-vilanterol (BREO ELLIPTA) 200-25 MCG/ACT AEPB Inhale 1 puff into the lungs daily. 11/25/21   Margaretha Seeds, MD  ?omeprazole (PRILOSEC) 20 MG capsule Take 1 capsule (20 mg total) by mouth daily. 09/13/21   Ladell Pier, MD  ?oxybutynin (DITROPAN) 5 MG tablet Take 1 tablet (5 mg total) by mouth 2 (two) times daily. ?Patient not taking: Reported on 08/17/2021 06/16/20   Charlott Rakes, MD  ?Respiratory Therapy Supplies (FLUTTER) DEVI Use as directed ?Patient not taking: Reported on 08/24/2021 12/01/17   Marshell Garfinkel, MD  ?sodium chloride (OCEAN) 0.65 % nasal spray Place 2 sprays into both nostrils every 4 (four) hours. ?Patient not taking: Reported on 08/24/2021 08/20/21   Idamae Schuller, MD  ?Tiotropium Bromide Monohydrate (SPIRIVA RESPIMAT) 2.5 MCG/ACT AERS Inhale 2 puffs into the lungs daily. 11/25/21   Margaretha Seeds, MD  ?   ? ?Allergies    ?Sulfa antibiotics, Chantix [varenicline tartrate], Fish allergy, Hygroton [chlorthalidone], Penicillins, and  Latex   ? ?Review of Systems   ?Review of Systems  ?Constitutional: Negative.   ?HENT: Negative.    ?Respiratory: Negative.    ?Cardiovascular:  Positive for chest pain (under left breast).  ?Gastrointestinal:  Positive for abdominal pain.  ?Genitourinary: Negative.   ?Musculoskeletal: Negative.   ?Skin:  Positive for rash.  ?Neurological: Negative.   ?All other systems reviewed and are negative. ? ?Physical Exam ?Updated Vital Signs ?BP (!) 128/108   Pulse 63   Temp 97.7 ?F (36.5 ?C) (Oral)   Resp  15   Ht '5\' 7"'$  (1.702 m)   Wt 122.5 kg   SpO2 93%   BMI 42.29 kg/m?  ?Physical Exam ?Vitals and nursing note reviewed.  ?Constitutional:   ?   General: She is not in acute distress. ?   Appearance: She is well-developed. She is not ill-appearing, toxic-appearing or diaphoretic.  ?HENT:  ?   Head: Normocephalic and atraumatic.  ?   Nose: Nose normal.  ?   Mouth/Throat:  ?   Mouth: Mucous membranes are moist.  ?Eyes:  ?   Pupils: Pupils are equal, round, and reactive to light.  ?Cardiovascular:  ?   Rate and Rhythm: Normal rate.  ?   Pulses: Normal pulses.     ?     Radial pulses are 2+ on the right side and 2+ on the left side.  ?     Dorsalis pedis pulses are 2+ on the right side and 2+ on the left side.  ?   Heart sounds: Normal heart sounds.  ?Pulmonary:  ?   Effort: Pulmonary effort is normal. No respiratory distress.  ?   Breath sounds: Normal breath sounds and air entry.  ?Chest:  ?   Comments: Tenderness to left lower ribs without rebound or guarding ?Abdominal:  ?   General: Bowel sounds are normal. There is no distension.  ?   Palpations: Abdomen is soft.  ?   Tenderness: There is abdominal tenderness in the left upper quadrant.  ?   Hernia: No hernia is present.  ? ? ?   Comments: Tenderness to left upper abd, neg CVA tap  ?Musculoskeletal:     ?   General: Normal range of motion.  ?   Cervical back: Normal range of motion.  ?   Comments: Full ROM, compartments soft  ?Skin: ?   General: Skin is warm and dry.  ?   Capillary Refill: Capillary refill takes less than 2 seconds.  ?   Findings: Rash present. Rash is urticarial.  ?   Comments: Urticaria posterior flank. No vesicles   ?Neurological:  ?   General: No focal deficit present.  ?   Mental Status: She is alert.  ?   Cranial Nerves: Cranial nerves 2-12 are intact.  ?   Sensory: Sensation is intact.  ?   Motor: Motor function is intact.  ?   Gait: Gait is intact.  ?   Comments: Cn 2-12 grossly intact  ?Psychiatric:     ?   Mood and Affect: Mood normal.   ? ? ?ED Results / Procedures / Treatments   ?Labs ?(all labs ordered are listed, but only abnormal results are displayed) ?Labs Reviewed  ?COMPREHENSIVE METABOLIC PANEL - Abnormal; Notable for the following components:  ?    Result Value  ? Glucose, Bld 124 (*)   ? Creatinine, Ser 1.91 (*)   ? Total Protein 8.9 (*)   ? GFR, Estimated 29 (*)   ?  All other components within normal limits  ?CBC WITH DIFFERENTIAL/PLATELET - Abnormal; Notable for the following components:  ? RBC 5.45 (*)   ? Hemoglobin 15.8 (*)   ? HCT 48.4 (*)   ? RDW 17.0 (*)   ? All other components within normal limits  ?URINALYSIS, ROUTINE W REFLEX MICROSCOPIC - Abnormal; Notable for the following components:  ? Color, Urine AMBER (*)   ? APPearance CLOUDY (*)   ? Protein, ur 30 (*)   ? Leukocytes,Ua TRACE (*)   ? Bacteria, UA MANY (*)   ? Non Squamous Epithelial 0-5 (*)   ? All other components within normal limits  ?LIPASE, BLOOD  ?TROPONIN I (HIGH SENSITIVITY)  ?TROPONIN I (HIGH SENSITIVITY)  ? ? ?EKG ?None ? ?Radiology ?DG Chest 2 View ? ?Result Date: 02/01/2022 ?CLINICAL DATA:  Chest pain. EXAM: CHEST - 2 VIEW COMPARISON:  August 19, 2021. FINDINGS: The heart size and mediastinal contours are within normal limits. Both lungs are clear. The visualized skeletal structures are unremarkable. IMPRESSION: No active cardiopulmonary disease. Electronically Signed   By: Marijo Conception M.D.   On: 02/01/2022 18:22  ? ?US BREAST LTD UNI RIGHT INC AXILLA ? ?Result Date: 02/01/2022 ?CLINICAL DATA:  66 year old female with non spontaneous bloody RIGHT nipple discharge identified on clinical exam. Also with RIGHT breast pain and annual bilateral mammogram. Patient has not seen bloody nipple discharge before or after this episode. EXAM: DIGITAL DIAGNOSTIC BILATERAL MAMMOGRAM WITH TOMOSYNTHESIS AND CAD; ULTRASOUND RIGHT BREAST LIMITED TECHNIQUE: Bilateral digital diagnostic mammography and breast tomosynthesis was performed. The images were evaluated with  computer-aided detection.; Targeted ultrasound examination of the right breast was performed COMPARISON:  Previous exam(s). ACR Breast Density Category b: There are scattered areas of fibroglandular density. FINDING

## 2022-02-01 NOTE — ED Provider Triage Note (Signed)
Emergency Medicine Provider Triage Evaluation Note ? ?[name] , a 66 y.o. female  was evaluated in triage.  Pt complains of an allergic reaction that occurred last night after taking her new HCTZ medication for blood pressure.  Patient developed a rash across chest and back and felt "funny" in her face and throat.  No difficulty breathing.  She took a Benadryl with small improvement.  Patient went to urgent care today where she was given IV Solu-Medrol and then referred to the emergency department for evaluation.  They refrain from EpiPen administration given her stable vitals and physical exam findings. ? ?Review of Systems  ?Positive:  ?Negative:  ? ?Physical Exam  ?BP 111/81 (BP Location: Right Arm)   Pulse 80   Temp 98.8 ?F (37.1 ?C) (Oral)   Resp 18   Ht '5\' 7"'$  (1.702 m)   Wt 122.5 kg   SpO2 94%   BMI 42.29 kg/m?  ?Gen:   Awake, no distress   ?Resp:  Normal effort lungs CTA bilaterally, no wheezing, airway intact ?MSK:   Moves extremities without difficulty  ?Other:  Mild urticarial rash across the upper back and collarbone; no swelling of the lips or face ? ?Medical Decision Making  ?Medically screening exam initiated at 3:25 PM.  Appropriate orders placed.  FERNANDE TREIBER was informed that the remainder of the evaluation will be completed by another provider, this initial triage assessment does not replace that evaluation, and the importance of remaining in the ED until their evaluation is complete. ? ? ?  ?Tonye Pearson, [place] ?02/01/22 1528 ? ?

## 2022-02-01 NOTE — Progress Notes (Signed)
She is currently in the emergency room. I have instructed her that we can not do a virtual visit for the same concerns she is being currently evaluated in the emergency room for. She verbalized understanding.  ? ?She was instructed to go to the ER today after being seen at urgent care with complaints of chest pain, pedal edema and rash to B/L upper extremities and back after starting chlorthalidone which was prescribed on 01-25-2022. She was given '80mg'$  IM solumedrol and instructed to be evaluated in the ED due to her complaints of chest pain although work up was benign at urgent care.  ?Prior to being prescribed chlorthalidone on 01-25-2022 she had complained of B/L pedal edema after her HCTZ was increased from 12.5 to 25 mg. It was at that time that she was switched to chlorthalidone. Her diet is not optimal and there may be a dietary component and not medication component that is causing her pedal edema.  Her pedal edema is also chronic and not a new recent finding.  ?BNP (last 3 results) ?Recent Labs  ?  01/11/22 ?1129  ?BNP 10.3  ? ? ?ProBNP (last 3 results) ?No results for input(s): PROBNP in the last 8760 hours.  ? ?Vient '80mg'$  solumederol IM while at urgent care ?

## 2022-02-01 NOTE — ED Triage Notes (Signed)
Pt arrived POV from urgent care c/o a possible allergic reaction. Pt states she started taking Clonidine on 3/28 and now she has hives on her back, facial swelling and an itchy throat. Pt states she was given some kind of shot at urgent care. ?

## 2022-02-01 NOTE — Discharge Instructions (Signed)
Please go to the ER soon as you leave urgent care for further evaluation and management. ?

## 2022-02-01 NOTE — ED Provider Notes (Signed)
?[place] ? ? ? ?CSN: [id number] ?Arrival date & time: 02/01/22  1157 ? ? ?  ? ?History   ?Chief Complaint ?Chief Complaint  ?Patient presents with  ? Allergic Reaction  ? ? ?HPI ?Krystal Allen is a 66 y.o. female.  ? ?Patient presents today due to concerns of allergic reaction.  Patient reports that she was prescribed chlorthalidone due to pedal edema a few days ago at PCP, and had subsequently a rash to upper extremities and back that is itchy.  Reports that she started feeling some chest pain, chest tightness, and throat tightness over the past 2 days.  Denies any shortness of breath or difficulty swallowing. ? ? ?Allergic Reaction ? ?Past Medical History:  ?Diagnosis Date  ? Allergy   ? COPD (chronic obstructive pulmonary disease) ([place])   ? Depression   ? GERD (gastroesophageal reflux disease)   ? Hernia, abdominal   ? History of chicken pox   ? Hyperlipidemia   ? Hypertension   ? Internal hemorrhoids with Grade 3 prolapse and bleeding 06/19/2013  ? Osteoarthritis   ? Personal history of colonic adenomas 06/25/2013  ? ? ?Patient Active Problem List  ? Diagnosis Date Noted  ? Mastitis, right, acute 12/23/2021  ? Influenza vaccine needed 09/14/2020  ? Tobacco dependence 05/12/2020  ? Aortic atherosclerosis ([place]) 05/12/2020  ? Adenomatous polyp of colon 05/12/2020  ? Langerhan's cell histiocytosis ([place]) 01/26/2018  ? Cigarette smoker 01/26/2018  ? Osteoarthritis of cervical spine 12/22/2017  ? Prediabetes 12/22/2017  ? Pain of left calf 04/06/2017  ? Centrilobular emphysema ([place]) 02/23/2016  ? Obesity (BMI 30-39.9) 01/15/2016  ? Bilateral shoulder pain 01/15/2016  ? Esophageal reflux 01/15/2016  ? Tobacco abuse 05/13/2015  ? Lung nodule 08/14/2014  ? Impaired fasting glucose 08/14/2014  ? Hyperlipidemia 08/14/2014  ? Personal history of colonic adenomas 06/25/2013  ? HTN (hypertension) 05/20/2013  ? ? ?Past Surgical History:  ?Procedure Laterality Date  ? ABDOMINAL HYSTERECTOMY  59yr ago  ? due to  heavy bleeding and fibroids   ? COLONOSCOPY    ? HEMORRHOID BANDING  2014  ? HEMORRHOID SURGERY  89[place]ao  ? UMBILICAL HERNIA REPAIR  06/2015  ? ? ?OB History   ?No obstetric history on file. ?  ? ? ? ?Home Medications   ? ?Prior to Admission medications   ?Medication Sig Start Date End Date Taking? Authorizing Provider  ?acetaminophen (TYLENOL 8 HOUR) 650 MG CR tablet Take 1 tablet (650 mg total) by mouth 2 (two) times daily as needed for pain. ?Patient not taking: Reported on 08/24/2021 04/06/17   FuBoykin NearingMD  ?albuterol (PROAIR HFA) 108 (90 Base) MCG/ACT inhaler Inhale 2 puffs into the lungs every 4 (four) hours as needed for wheezing or shortness of breath. 01/19/22   JoLadell PierMD  ?albuterol (PROVENTIL) (2.5 MG/3ML) 0.083% nebulizer solution TAKE 3 MLS BY NEBULIZATION EVERY 6 (SIX) HOURS AS NEEDED FOR WHEEZING OR SHORTNESS OF BREATH. 08/20/21   KhIdamae SchullerMD  ?amLODipine (NORVASC) 5 MG tablet Take 1 tablet (5 mg total) by mouth daily. 01/11/22   JoLadell PierMD  ?aspirin EC 81 MG tablet Take 1 tablet (81 mg total) by mouth daily. 05/12/20   JoLadell PierMD  ?atorvastatin (LIPITOR) 20 MG tablet TAKE 1 TABLET (20 MG TOTAL) BY MOUTH DAILY. 09/16/21 09/16/22  JoLadell PierMD  ?chlorthalidone (HYGROTON) 25 MG tablet Take 1 tablet (25 mg total) by mouth daily. 01/25/22   FlGeryl Rankins  W, NP  ?doxycycline (VIBRA-TABS) 100 MG tablet Take 1 tablet (100 mg total) by mouth 2 (two) times daily. 12/31/21   Ladell Pier, MD  ?Elastic Bandages & Supports ([place]) [place] R60.0 01/25/22   Gildardo Pounds, NP  ?fluticasone furoate-vilanterol (BREO ELLIPTA) 200-25 MCG/ACT AEPB Inhale 1 puff into the lungs daily. 11/25/21   Margaretha Seeds, MD  ?omeprazole (PRILOSEC) 20 MG capsule Take 1 capsule (20 mg total) by mouth daily. 09/13/21   Ladell Pier, MD  ?oxybutynin (DITROPAN) 5 MG tablet Take 1 tablet (5 mg total) by mouth 2 (two) times daily. ?Patient not  taking: Reported on 08/17/2021 06/16/20   Charlott Rakes, MD  ?Respiratory Therapy Supplies (FLUTTER) DEVI Use as directed ?Patient not taking: Reported on 08/24/2021 12/01/17   Marshell Garfinkel, MD  ?sodium chloride (OCEAN) 0.65 % nasal spray Place 2 sprays into both nostrils every 4 (four) hours. ?Patient not taking: Reported on 08/24/2021 08/20/21   Idamae Schuller, MD  ?Tiotropium Bromide Monohydrate (SPIRIVA RESPIMAT) 2.5 MCG/ACT AERS Inhale 2 puffs into the lungs daily. 11/25/21   Margaretha Seeds, MD  ? ? ?Family History ?Family History  ?Problem Relation Age of Onset  ? Cancer Father   ?     prostate  ? Hypertension Mother   ? Diabetes Mother   ? Hypertension Sister   ? Heart disease Maternal Uncle   ? Stroke Maternal Grandmother   ? Hypertension Maternal Grandmother   ? Hypertension Sister   ? Colon cancer Neg Hx   ? Stomach cancer Neg Hx   ? Rectal cancer Neg Hx   ? Colon polyps Neg Hx   ? ? ?Social History ?Social History  ? ?Tobacco Use  ? Smoking status: Former  ?  Packs/day: 0.50  ?  Years: 40.00  ?  Pack years: 20.00  ?  Types: Cigarettes  ?  Quit date: 07/2021  ?  Years since quitting: 0.5  ? Smokeless tobacco: Never  ? Tobacco comments:  ?  Quit [id number]  ?Vaping Use  ? Vaping Use: Every day  ?Substance Use Topics  ? Alcohol use: Yes  ?  Comment: occasional  ? Drug use: No  ? ? ? ?Allergies   ?Sulfa antibiotics, Chantix [varenicline tartrate], Fish allergy, Hygroton [chlorthalidone], Penicillins, and Latex ? ? ?Review of Systems ?Review of Systems ?Per HPI ? ?Physical Exam ?Triage Vital Signs ?ED Triage Vitals  ?Enc Vitals Group  ?   BP 02/01/22 1221 132/77  ?   Pulse Rate 02/01/22 1221 76  ?   Resp 02/01/22 1221 18  ?   Temp 02/01/22 1221 98.6 ?F (37 ?C)  ?   Temp Source 02/01/22 1221 Oral  ?   SpO2 02/01/22 1221 95 %  ?   Weight --   ?   Height --   ?   Head Circumference --   ?   Peak Flow --   ?   Pain Score 02/01/22 1222 0  ?   Pain Loc --   ?   Pain Edu? --   ?   Excl. in [place]? --   ? ?No data  found. ? ?Updated Vital Signs ?BP 132/77 (BP Location: Left Arm)   Pulse 76   Temp 98.6 ?F (37 ?C) (Oral)   Resp 18   SpO2 95%  ? ?Visual Acuity ?Right Eye Distance:   ?Left Eye Distance:   ?Bilateral Distance:   ? ?Right Eye Near:   ?Left Eye Near:    ?  Bilateral Near:    ? ?Physical Exam ?Constitutional:   ?   General: She is not in acute distress. ?   Appearance: Normal appearance. She is not toxic-appearing or diaphoretic.  ?HENT:  ?   Head: Normocephalic and atraumatic.  ?   Mouth/Throat:  ?   Pharynx: No pharyngeal swelling, oropharyngeal exudate or posterior oropharyngeal erythema.  ?   Tonsils: No tonsillar exudate.  ?Eyes:  ?   Extraocular Movements: Extraocular movements intact.  ?   Conjunctiva/sclera: Conjunctivae normal.  ?Cardiovascular:  ?   Rate and Rhythm: Normal rate and regular rhythm.  ?   Pulses: Normal pulses.  ?   Heart sounds: Normal heart sounds.  ?Pulmonary:  ?   Effort: Pulmonary effort is normal. No respiratory distress.  ?   Breath sounds: Normal breath sounds.  ?Neurological:  ?   General: No focal deficit present.  ?   Mental Status: She is alert and oriented to person, place, and time. Mental status is at baseline.  ?Psychiatric:     ?   Mood and Affect: Mood normal.     ?   Behavior: Behavior normal.     ?   Thought Content: Thought content normal.     ?   Judgment: Judgment normal.  ? ? ? ?UC Treatments / Results  ?Labs ?(all labs ordered are listed, but only abnormal results are displayed) ?Labs Reviewed - No data to display ? ?EKG ? ? ?Radiology ?No results found. ? ?Procedures ?Procedures (including critical care time) ? ?Medications Ordered in UC ?Medications  ?methylPREDNISolone sodium succinate (SOLU-MEDROL) 125 mg/2 mL injection 80 mg (80 mg Intramuscular Given 02/01/22 1236)  ? ? ?Initial Impression / Assessment and Plan / UC Course  ?I have reviewed the triage vital signs and the nursing notes. ? ?Pertinent labs & imaging results that were available during my care of the  patient were reviewed by me and considered in my medical decision making (see chart for details). ? ?  ? ?Patient appears stable in urgent care with no respiratory distress, shortness of breath, and no throat swelling on

## 2022-02-01 NOTE — ED Notes (Signed)
Pt states she is having sharp chest pain under right breast and radiates to back. Pt states she felt it earlier with sob and nausea.  ?

## 2022-02-01 NOTE — ED Notes (Signed)
This patient told this RN that she had sudden onset of sharp pain to her left side, pointing to left upper abdomen area below her breast. This RN informed the triage PA Danae Chen who has placed orders for labs. Pt to be called back to triage for lab draw. ?

## 2022-02-01 NOTE — Discharge Instructions (Signed)
May start taking Zyrtec for the allergic reaction.  Stop taking the new blood pressure medication causing the allergic reaction. ? ?Your work-up in the emergency department is reassuring. ? ?Return for new or worsening symptoms. ?

## 2022-02-01 NOTE — ED Notes (Signed)
Patient is being discharged from the Urgent Care and sent to the Emergency Department via Oswaldo Conroy, NP . Per Hildred Alamin, patient is in need of higher level of care due to need for further evaluation. Patient is aware and verbalizes understanding of plan of care.  ?Vitals:  ? 02/01/22 1221  ?BP: 132/77  ?Pulse: 76  ?Resp: 18  ?Temp: 98.6 ?F (37 ?C)  ?SpO2: 95%  ?  ?

## 2022-02-02 ENCOUNTER — Telehealth: Payer: Self-pay | Admitting: Internal Medicine

## 2022-02-02 ENCOUNTER — Ambulatory Visit (INDEPENDENT_AMBULATORY_CARE_PROVIDER_SITE_OTHER)
Admission: RE | Admit: 2022-02-02 | Discharge: 2022-02-02 | Disposition: A | Discharge status: Home / Self Care | Payer: Medicare Other | Source: Ambulatory Visit | Attending: Acute Care | Admitting: Acute Care

## 2022-02-02 DIAGNOSIS — Z87891 Personal history of nicotine dependence: Secondary | ICD-10-CM

## 2022-02-02 NOTE — Telephone Encounter (Signed)
Copied from [place] [id number]. Topic: General - Other ?>> Feb 02, 2022  8:54 AM Tessa Lerner A wrote: ?Reason for CRM: The patient has been to the ER for an allergic reaction to their prescription for Rx #: [id number]  ?chlorthalidone (HYGROTON) 25 MG tablet [[id number]]  ? ?Yesterday 02/01/22 the patient experienced swelling in their face and throat as well as spots on their back  ? ?The patient shares that all of their symptoms are going down after their visit to [facility] ED ? ?The patient was directed by ER staff to no longer take the medication and contact their PCP to discuss an alternative BP medication  ? ?The patient would like to speak with a member of staff regarding this further  ? ?Please contact ?

## 2022-02-02 NOTE — Telephone Encounter (Signed)
Patient was seen in ED for medication reaction and advised to have office follow up in 2 days- newest medication for BP was discontinued- chlorthalidone 25 mg. Patient reports she is feeling better today- her face and back is much better. Patient is requesting PCP review her hospital labs. ?Patient advised would send message for scheduling and alert provider she had to stop her medication. ?

## 2022-02-02 NOTE — Telephone Encounter (Signed)
Pt has been schedule an appt with cari for 4/6  ?

## 2022-02-02 NOTE — Telephone Encounter (Signed)
Will route to patient's PCP so she is aware. ?

## 2022-02-03 ENCOUNTER — Encounter: Payer: Self-pay | Admitting: Physician Assistant

## 2022-02-03 ENCOUNTER — Ambulatory Visit: Payer: Medicare Other | Admitting: Physician Assistant

## 2022-02-03 ENCOUNTER — Other Ambulatory Visit: Payer: Self-pay

## 2022-02-03 ENCOUNTER — Ambulatory Visit (INDEPENDENT_AMBULATORY_CARE_PROVIDER_SITE_OTHER): Payer: Medicare Other | Admitting: Physician Assistant

## 2022-02-03 VITALS — BP 123/78 | HR 73 | Temp 98.0°F | Resp 18 | Wt 265.4 lb

## 2022-02-03 DIAGNOSIS — B3781 Candidal esophagitis: Secondary | ICD-10-CM | POA: Diagnosis not present

## 2022-02-03 DIAGNOSIS — T7840XS Allergy, unspecified, sequela: Secondary | ICD-10-CM | POA: Diagnosis not present

## 2022-02-03 DIAGNOSIS — B370 Candidal stomatitis: Secondary | ICD-10-CM

## 2022-02-03 DIAGNOSIS — I10 Essential (primary) hypertension: Secondary | ICD-10-CM

## 2022-02-03 MED ORDER — NYSTATIN 100000 UNIT/ML MT SUSP
5.0000 mL | Freq: Four times a day (QID) | OROMUCOSAL | 0 refills | Status: DC
  Filled 2022-02-03: qty 60, 3d supply, fill #0

## 2022-02-03 NOTE — Progress Notes (Signed)
Patient is here for a recheck of an allergic reaction to a BP pill. Patient said that her throat is sore and looks like she has thrush.Patient said sh does not feel right. ?

## 2022-02-03 NOTE — Progress Notes (Signed)
? ?Established Patient Office Visit ? ?Subjective:  ?Patient ID: Krystal Allen, female    DOB: February 10, 1956  Age: 66 y.o. MRN: [id number] ? ?CC:  ?Chief Complaint  ?Patient presents with  ? Allergic Reaction  ?  medication  ? ? ?HPI ?Krystal Allen states that she was seen in the emergency department on February 01, 2022.  Hospital course ? ?Krystal Allen is a 66 y.o. female for evaluation of possible allergic reaction.  Started on clonidine 3 days ago for elevated BP.  Noted urticaria on her back.  Initially had a scratchy throat however this has resolved her urticaria did not.  Subsequently seen at urgent care.  Sent here for further evaluation.  Patient still has persistent urticaria, given steroids at urgent care.  She has no intraoral, facial swelling.  No sensation of throat closing, angioedema.  Does admit to some left-sided chest, upper abdominal pain.  No dysuria, hematuria.  No shortness of breath, cough.  No lower extremity swelling.  No PND, orthopnea.  She has never had anything like this previously.  Denies prior history of AAA, dissection.  No numbness or weakness to her lower extremities ? ?ED Course/ Medical Decision Making/ A&P ?  ?66 year old here for evaluation of allergic reaction.  Started on clonidine by PCP for elevated blood pressures.  Developed urticaria, yesterday had some tightness in her throat which resolved.  Seen by urgent care got Solu-Medrol sent here for further evaluation.  Patient does have some urticaria to her posterior trunk, no vesicles.  No evidence of angioedema, respiratory distress.  Denies sensation of throat closing.  Does admit to some left-sided chest, upper abdominal pain which began while she was on the waiting room.  She has never had anything like this previously.  Denies any PND, orthopnea, lower extremity swelling.  No history of PE or DVT.  She does not typically get chest pain with exertion.  No urinary symptoms. ? ? ?States today that she is feeling better and  much improved, however she states that she still feels "different".  States that she has not taken the amlodipine either since this started. ? ?States that she does check her blood pressure at home, states that yesterday it was "174 over something". ? ?States that her mouth has been burning, states that she believes she has thrush.  Has not tried anything for relief.  States that this started yesterday. ? ? ?States that she was experiencing bilateral pedal edema, states that she previously was on hydrochlorothiazide and it was increased to 25 mg, states that her amlodipine was decreased to 5 mg.  States that her bilateral pedal edema had not resolved so she was then started on chlorthalidone.  States today that her bilateral pedal edema is not present. ? ?States that she has been taking amlodipine for "years" ? ?Past Medical History:  ?Diagnosis Date  ? Allergy   ? COPD (chronic obstructive pulmonary disease) ([place])   ? Depression   ? GERD (gastroesophageal reflux disease)   ? Hernia, abdominal   ? History of chicken pox   ? Hyperlipidemia   ? Hypertension   ? Internal hemorrhoids with Grade 3 prolapse and bleeding 06/19/2013  ? Osteoarthritis   ? Personal history of colonic adenomas 06/25/2013  ? ? ?Past Surgical History:  ?Procedure Laterality Date  ? ABDOMINAL HYSTERECTOMY  12yr ago  ? due to heavy bleeding and fibroids   ? COLONOSCOPY    ? HEMORRHOID BANDING  2014  ? HEMORRHOID SURGERY  28yr agao  ? UMBILICAL HERNIA REPAIR  06/2015  ? ? ?Family History  ?Problem Relation Age of Onset  ? Cancer Father   ?     prostate  ? Hypertension Mother   ? Diabetes Mother   ? Hypertension Sister   ? Heart disease Maternal Uncle   ? Stroke Maternal Grandmother   ? Hypertension Maternal Grandmother   ? Hypertension Sister   ? Colon cancer Neg Hx   ? Stomach cancer Neg Hx   ? Rectal cancer Neg Hx   ? Colon polyps Neg Hx   ? ? ?Social History  ? ?Socioeconomic History  ? Marital status: Married  ?  Spouse name: Not on file  ?  Number of children: Not on file  ? Years of education: 12+  ? Highest education level: Not on file  ?Occupational History  ? Occupation: PCA  ?  Employer: [employer] ?Tobacco Use  ? Smoking status: Former  ?  Packs/day: 0.50  ?  Years: 40.00  ?  Pack years: 20.00  ?  Types: Cigarettes  ?  Quit date: 07/2021  ?  Years since quitting: 0.5  ? Smokeless tobacco: Never  ? Tobacco comments:  ?  Quit [id number] ?Vaping Use  ? Vaping Use: Every day  ?Substance and Sexual Activity  ? Alcohol use: Yes  ?  Comment: occasional  ? Drug use: No  ? Sexual activity: Not Currently  ?Other Topics Concern  ? Not on file  ?Social History Narrative  ? Regular exercise-no Caffeine Use-  ? ?Social Determinants of Health  ? ?Financial Resource Strain: Not on file  ?Food Insecurity: Not on file  ?Transportation Needs: Not on file  ?Physical Activity: Not on file  ?Stress: Not on file  ?Social Connections: Not on file  ?Intimate Partner Violence: Not on file  ? ? ?Outpatient Medications Prior to Visit  ?Medication Sig Dispense Refill  ? acetaminophen (TYLENOL 8 HOUR) 650 MG CR tablet Take 1 tablet (650 mg total) by mouth 2 (two) times daily as needed for pain. 60 tablet 5  ? albuterol (PROAIR HFA) 108 (90 Base) MCG/ACT inhaler Inhale 2 puffs into the lungs every 4 (four) hours as needed for wheezing or shortness of breath. 8.5 g 2  ? albuterol (PROVENTIL) (2.5 MG/3ML) 0.083% nebulizer solution TAKE 3 MLS BY NEBULIZATION EVERY 6 (SIX) HOURS AS NEEDED FOR WHEEZING OR SHORTNESS OF BREATH. 90 mL 1  ? amLODipine (NORVASC) 5 MG tablet Take 1 tablet (5 mg total) by mouth daily. 30 tablet 5  ? aspirin EC 81 MG tablet Take 1 tablet (81 mg total) by mouth daily. 100 tablet 1  ? atorvastatin (LIPITOR) 20 MG tablet TAKE 1 TABLET (20 MG TOTAL) BY MOUTH DAILY. 90 tablet 1  ? doxycycline (VIBRA-TABS) 100 MG tablet Take 1 tablet (100 mg total) by mouth 2 (two) times daily. 8 tablet 0  ? Elastic Bandages & Supports (MEDICAL COMPRESSION STOCKINGS) MISC R60.0 1  each 0  ? fluticasone furoate-vilanterol (BREO ELLIPTA) 200-25 MCG/ACT AEPB Inhale 1 puff into the lungs daily. 60 each 5  ? omeprazole (PRILOSEC) 20 MG capsule Take 1 capsule (20 mg total) by mouth daily. 30 capsule 3  ? oxybutynin (DITROPAN) 5 MG tablet Take 1 tablet (5 mg total) by mouth 2 (two) times daily. 60 tablet 3  ? Respiratory Therapy Supplies (FLUTTER) DEVI Use as directed 1 each 0  ? sodium chloride (OCEAN) 0.65 % nasal spray Place 2 sprays into both  nostrils every 4 (four) hours. 44 mL 0  ? Tiotropium Bromide Monohydrate (SPIRIVA RESPIMAT) 2.5 MCG/ACT AERS Inhale 2 puffs into the lungs daily. 4 g 5  ? chlorthalidone (HYGROTON) 25 MG tablet Take 1 tablet (25 mg total) by mouth daily. (Patient not taking: Reported on 02/03/2022) 30 tablet 1  ? ?No facility-administered medications prior to visit.  ? ? ?Allergies  ?Allergen Reactions  ? Sulfa Antibiotics Anaphylaxis and Hives  ? Chantix [Varenicline Tartrate]   ?  Bad dreams  ? Fish Allergy Other (See Comments)  ?  Severe stomach cramps   ? Hygroton [Chlorthalidone]   ? Penicillins Other (See Comments)  ?  Has patient had a PCN reaction causing immediate rash, facial/tongue/throat swelling, SOB or lightheadedness with hypotension: No ?Has patient had a PCN reaction causing severe rash involving mucus membranes or skin necrosis: No ?Has patient had a PCN reaction that required hospitalization No ?Has patient had a PCN reaction occurring within the last 10 years: No ?If all of the above answers are "NO", then may proceed with Cephalosporin use. ? ?Welts   ? Latex Rash and Other (See Comments)  ?  Dry skin  ? ? ?ROS ?Review of Systems  ?Constitutional: Negative.   ?HENT: Negative.    ?Respiratory:  Negative for shortness of breath.   ?Cardiovascular:  Negative for chest pain.  ?Gastrointestinal: Negative.   ?Endocrine: Negative.   ?Genitourinary: Negative.   ?Musculoskeletal: Negative.   ?Skin:  Negative for rash.  ?Allergic/Immunologic: Negative.    ?Neurological: Negative.   ?Hematological: Negative.   ?Psychiatric/Behavioral: Negative.    ? ?  ?Objective:  ?  ?Physical Exam ?Vitals and nursing note reviewed.  ?Constitutional:   ?   General: She is not in ac

## 2022-02-03 NOTE — Patient Instructions (Signed)
You are going to restart taking the amlodipine 5 mg and the hydrochlorothiazide 25 mg.  Please check your blood pressure twice a day, keep a written log and have available for your office visit with me at the beginning and next week. ? ?To help with the burning in your mouth, you are going to use the nystatin rinse. ? ?I do encourage you to make sure you are taking your omeprazole on a daily basis as well. ?Kennieth Rad, PA-C ?Physician Assistant ?[place] ?[email or web address] ? ? ? ?Oral Thrush, Adult ?Oral thrush, also called oral candidiasis, is a fungal infection that develops in the mouth and throat and on the tongue. It causes white patches to form in the mouth and on the tongue. ?Many cases of thrush are mild, but this infection can also be serious. Ritta Slot can be a repeated (recurrent) problem for certain people who have a weak body defense system (immune system). The weakness can be caused by chronic illnesses, or by taking medicines that limit the body's ability to fight infection. If a person has difficulty fighting infection, the fungus that causes thrush can spread through the body. This can cause life-threatening blood or organ infections. ?What are the causes? ?This condition is caused by a fungus (yeast) called Candida albicans. ?This fungus is normally present in small amounts in the mouth and on other mucous membranes. It usually causes no harm. ?If conditions are present that allow the fungus to grow without control, it invades surrounding tissues and becomes an infection. ?Other Candida species can also lead to thrush, though this is rare. ?What increases the risk? ?The following factors may make you more likely to develop this condition: ?Having a weakened immune system. ?Being an older adult. ?Having diabetes, cancer, or HIV (human immunodeficiency virus). ?Having dry mouth (xerostomia). ?Being pregnant or breastfeeding. ?Having poor  dental care, especially in those who have dentures. ?Using antibiotic or steroid medicines. ?What are the signs or symptoms? ?Symptoms of this condition can vary from mild and moderate to severe and persistent. Symptoms may include: ?A burning feeling in the mouth and throat. This can occur at the start of a thrush infection. ?White patches that stick to the mouth and tongue. The tissue around the patches may be red, raw, and painful. If rubbed (during tooth brushing, for example), the patches and the tissue of the mouth may bleed easily. ?A bad taste in the mouth or difficulty tasting foods. ?A cottony feeling in the mouth. ?Pain during eating and swallowing. ?Poor appetite. ?Cracking at the corners of the mouth. ?How is this diagnosed? ?This condition is diagnosed based on: ?A physical exam. ?Your medical history. ?How is this treated? ?This condition is treated with medicines called antifungals, which prevent the growth of fungi. These medicines are either applied directly to the affected area (topical) or swallowed (oral). The treatment will depend on the severity of the condition. ?Mild cases of thrush may be treated with an antifungal mouth rinse or lozenges. Treatment usually lasts about 14 days. ?Moderate to severe cases of thrush can be treated with oral antifungal medicine, if they have spread to the esophagus. A topical antifungal medicine may also be used. For some severe infections, treatment may need to continue for more than 14 days. ?Oral antifungal medicines are rarely used during pregnancy because they may be harmful to the unborn child. If you are pregnant, talk with your health care provider about options for treatment. ?Persistent or recurrent thrush. For cases of thrush  that do not go away or keep coming back: ?Treatment may be needed twice as long as the symptoms last. ?Treatment will include both oral and topical antifungal medicines. ?People with a weakened immune system can take an  antifungal medicine on a continuous basis to prevent thrush infections. ?It is important to treat conditions that make a person more likely to get thrush, such as diabetes or HIV. ?Follow these instructions at home: ?Medicines ?Take or use over-the-counter and prescription medicines only as told by your health care provider. ?Talk with your health care provider about an over-the-counter medicine called gentian violet, which kills bacteria and fungi. ?Relieving soreness and discomfort ?To help reduce the discomfort of thrush: ?Drink cold liquids such as water or iced tea. ?Try flavored ice treats or frozen juices. ?Eat foods that are easy to swallow, such as gelatin, ice cream, or custard. ?Try drinking from a straw if the patches in your mouth are painful. ? ?General instructions ?Eat plain, unflavored yogurt as directed by your health care provider. Check the label to make sure the yogurt contains live cultures. This yogurt can help healthy bacteria grow in the mouth and can stop the growth of the fungus that causes thrush. ?If you wear dentures, remove the dentures before going to bed, brush them vigorously, and soak them in a cleaning solution as directed by your health care provider. ?Rinse your mouth with a warm salt-water mixture several times a day. To make a salt-water mixture, dissolve ?-1 tsp (3-6 g) of salt in 1 cup (237 mL) of warm water. ?Contact a health care provider if: ?Your symptoms are getting worse or are not improving within 7 days of starting treatment. ?You have symptoms of a spreading infection, such as white patches on the skin outside of the mouth. ?You are breastfeeding your baby and you have redness and pain in the nipples. ?Summary ?Oral thrush, also called oral candidiasis, is a fungal infection that develops in the mouth and throat and on the tongue. It causes white patches to form in the mouth and on the tongue. ?You are more likely to get this condition if you have a weakened immune  system or an underlying condition, such as HIV, cancer, or diabetes. ?This condition is treated with medicines called antifungals, which prevent the growth of fungi. ?Contact a health care provider if your symptoms do not improve, or get worse, within 7 days of starting treatment. ?This information is not intended to replace advice given to you by your health care provider. Make sure you discuss any questions you have with your health care provider. ?Document Revised: 08/23/2019 Document Reviewed: 08/23/2019 ?Elsevier Patient Education ? [place]. ? ?

## 2022-02-07 ENCOUNTER — Encounter: Payer: Self-pay | Admitting: Physician Assistant

## 2022-02-07 ENCOUNTER — Other Ambulatory Visit: Payer: Self-pay

## 2022-02-07 ENCOUNTER — Ambulatory Visit (INDEPENDENT_AMBULATORY_CARE_PROVIDER_SITE_OTHER): Payer: Medicare Other | Admitting: Physician Assistant

## 2022-02-07 VITALS — BP 133/8 | HR 63 | Temp 98.2°F | Resp 18 | Ht 67.5 in | Wt 263.0 lb

## 2022-02-07 DIAGNOSIS — B370 Candidal stomatitis: Secondary | ICD-10-CM

## 2022-02-07 DIAGNOSIS — B3781 Candidal esophagitis: Secondary | ICD-10-CM

## 2022-02-07 DIAGNOSIS — T7840XS Allergy, unspecified, sequela: Secondary | ICD-10-CM | POA: Diagnosis not present

## 2022-02-07 DIAGNOSIS — H1011 Acute atopic conjunctivitis, right eye: Secondary | ICD-10-CM

## 2022-02-07 DIAGNOSIS — J302 Other seasonal allergic rhinitis: Secondary | ICD-10-CM | POA: Diagnosis not present

## 2022-02-07 DIAGNOSIS — I10 Essential (primary) hypertension: Secondary | ICD-10-CM | POA: Diagnosis not present

## 2022-02-07 MED ORDER — CETIRIZINE HCL 10 MG PO TABS
10.0000 mg | ORAL_TABLET | Freq: Every day | ORAL | 11 refills | Status: DC
  Filled 2022-02-07 – 2022-06-02 (×3): qty 30, 30d supply, fill #0
  Filled 2022-09-30: qty 30, 30d supply, fill #1

## 2022-02-07 MED ORDER — OLOPATADINE HCL 0.2 % OP SOLN
1.0000 [drp] | Freq: Every day | OPHTHALMIC | 1 refills | Status: DC
  Filled 2022-02-07 – 2022-04-20 (×2): qty 2.5, 50d supply, fill #0
  Filled 2022-06-02: qty 2.5, 20d supply, fill #0

## 2022-02-07 MED ORDER — HYDROCHLOROTHIAZIDE 25 MG PO TABS
25.0000 mg | ORAL_TABLET | Freq: Every day | ORAL | 1 refills | Status: DC
  Filled 2022-02-07 – 2022-05-12 (×2): qty 30, 30d supply, fill #0
  Filled 2022-05-23: qty 30, 30d supply, fill #1

## 2022-02-07 NOTE — Progress Notes (Signed)
? ?Established Patient Office Visit ? ?Subjective:  ?Patient ID: Krystal Allen, female    DOB: 1956/03/05  Age: 66 y.o. MRN: [id number] ? ?CC:  ?Chief Complaint  ?Patient presents with  ? Follow-up  ?  Thrush/allergies  ? ? ?HPI ?Krystal Allen reports that she did restart taking the amlodipine and hydrochlorothiazide.  States that she has been checking her blood pressure at home and blood pressure readings are within normal limits, similar to today's or slightly lower.  States that she has not noticed any pedal edema. ? ?States that her thrush has mostly resolved. ? ?States that she continues to have redness, itching and clear drainage in her right eye.  States that she has been using Benadryl and over-the-counter lubricating drops without relief. ? ?  ? ? ?Past Medical History:  ?Diagnosis Date  ? Allergy   ? COPD (chronic obstructive pulmonary disease) ([place])   ? Depression   ? GERD (gastroesophageal reflux disease)   ? Hernia, abdominal   ? History of chicken pox   ? Hyperlipidemia   ? Hypertension   ? Internal hemorrhoids with Grade 3 prolapse and bleeding 06/19/2013  ? Osteoarthritis   ? Personal history of colonic adenomas 06/25/2013  ? ? ?Past Surgical History:  ?Procedure Laterality Date  ? ABDOMINAL HYSTERECTOMY  15yr ago  ? due to heavy bleeding and fibroids   ? COLONOSCOPY    ? HEMORRHOID BANDING  2014  ? HEMORRHOID SURGERY  89[place]  ? UMBILICAL HERNIA REPAIR  06/2015  ? ? ?Family History  ?Problem Relation Age of Onset  ? Cancer Father   ?     prostate  ? Hypertension Mother   ? Diabetes Mother   ? Hypertension Sister   ? Heart disease Maternal Uncle   ? Stroke Maternal Grandmother   ? Hypertension Maternal Grandmother   ? Hypertension Sister   ? Colon cancer Neg Hx   ? Stomach cancer Neg Hx   ? Rectal cancer Neg Hx   ? Colon polyps Neg Hx   ? ? ?Social History  ? ?Socioeconomic History  ? Marital status: Married  ?  Spouse name: Not on file  ? Number of children: Not on file  ? Years of education:  12+  ? Highest education level: Not on file  ?Occupational History  ? Occupation: PCA  ?  Employer: [employer]?Tobacco Use  ? Smoking status: Former  ?  Packs/day: 0.50  ?  Years: 40.00  ?  Pack years: 20.00  ?  Types: Cigarettes  ?  Quit date: 07/2021  ?  Years since quitting: 0.5  ? Smokeless tobacco: Never  ? Tobacco comments:  ?  Quit [id number]?Vaping Use  ? Vaping Use: Every day  ?Substance and Sexual Activity  ? Alcohol use: Yes  ?  Comment: occasional  ? Drug use: No  ? Sexual activity: Not Currently  ?Other Topics Concern  ? Not on file  ?Social History Narrative  ? Regular exercise-no Caffeine Use-  ? ?Social Determinants of Health  ? ?Financial Resource Strain: Not on file  ?Food Insecurity: Not on file  ?Transportation Needs: Not on file  ?Physical Activity: Not on file  ?Stress: Not on file  ?Social Connections: Not on file  ?Intimate Partner Violence: Not on file  ? ? ?Outpatient Medications Prior to Visit  ?Medication Sig Dispense Refill  ? acetaminophen (TYLENOL 8 HOUR) 650 MG CR tablet Take 1 tablet (650 mg total) by mouth  2 (two) times daily as needed for pain. 60 tablet 5  ? albuterol (PROAIR HFA) 108 (90 Base) MCG/ACT inhaler Inhale 2 puffs into the lungs every 4 (four) hours as needed for wheezing or shortness of breath. 8.5 g 2  ? albuterol (PROVENTIL) (2.5 MG/3ML) 0.083% nebulizer solution TAKE 3 MLS BY NEBULIZATION EVERY 6 (SIX) HOURS AS NEEDED FOR WHEEZING OR SHORTNESS OF BREATH. 90 mL 1  ? amLODipine (NORVASC) 5 MG tablet Take 1 tablet (5 mg total) by mouth daily. 30 tablet 5  ? aspirin EC 81 MG tablet Take 1 tablet (81 mg total) by mouth daily. 100 tablet 1  ? atorvastatin (LIPITOR) 20 MG tablet TAKE 1 TABLET (20 MG TOTAL) BY MOUTH DAILY. 90 tablet 1  ? Elastic Bandages & Supports (MEDICAL COMPRESSION STOCKINGS) MISC R60.0 1 each 0  ? fluticasone furoate-vilanterol (BREO ELLIPTA) 200-25 MCG/ACT AEPB Inhale 1 puff into the lungs daily. 60 each 5  ? omeprazole (PRILOSEC) 20 MG capsule Take 1  capsule (20 mg total) by mouth daily. 30 capsule 3  ? Respiratory Therapy Supplies (FLUTTER) DEVI Use as directed 1 each 0  ? sodium chloride (OCEAN) 0.65 % nasal spray Place 2 sprays into both nostrils every 4 (four) hours. 44 mL 0  ? Tiotropium Bromide Monohydrate (SPIRIVA RESPIMAT) 2.5 MCG/ACT AERS Inhale 2 puffs into the lungs daily. 4 g 5  ? oxybutynin (DITROPAN) 5 MG tablet Take 1 tablet (5 mg total) by mouth 2 (two) times daily. (Patient not taking: Reported on 02/07/2022) 60 tablet 3  ? doxycycline (VIBRA-TABS) 100 MG tablet Take 1 tablet (100 mg total) by mouth 2 (two) times daily. 8 tablet 0  ? nystatin (MYCOSTATIN) 100000 UNIT/ML suspension Take 5 mLs (500,000 Units total) by mouth 4 (four) times daily. 60 mL 0  ? ?No facility-administered medications prior to visit.  ? ? ?Allergies  ?Allergen Reactions  ? Sulfa Antibiotics Anaphylaxis and Hives  ? Chantix [Varenicline Tartrate]   ?  Bad dreams  ? Fish Allergy Other (See Comments)  ?  Severe stomach cramps   ? Hygroton [Chlorthalidone]   ? Penicillins Other (See Comments)  ?  Has patient had a PCN reaction causing immediate rash, facial/tongue/throat swelling, SOB or lightheadedness with hypotension: No ?Has patient had a PCN reaction causing severe rash involving mucus membranes or skin necrosis: No ?Has patient had a PCN reaction that required hospitalization No ?Has patient had a PCN reaction occurring within the last 10 years: No ?If all of the above answers are "NO", then may proceed with Cephalosporin use. ? ?Welts   ? Latex Rash and Other (See Comments)  ?  Dry skin  ? ? ?ROS ?Review of Systems  ?Constitutional:  Negative for chills and fever.  ?HENT: Negative.    ?Eyes:  Positive for redness and itching. Negative for photophobia, pain, discharge and visual disturbance.  ?Respiratory:  Negative for cough and shortness of breath.   ?Cardiovascular:  Negative for chest pain and leg swelling.  ?Gastrointestinal: Negative.   ?Endocrine: Negative.    ?Genitourinary: Negative.   ?Musculoskeletal: Negative.   ?Skin: Negative.   ?Allergic/Immunologic: Negative.   ?Neurological: Negative.   ?Hematological: Negative.   ?Psychiatric/Behavioral: Negative.    ? ?  ?Objective:  ?  ?Physical Exam ?Vitals and nursing note reviewed.  ?Constitutional:   ?   Appearance: Normal appearance.  ?HENT:  ?   Head: Normocephalic and atraumatic.  ?   Right Ear: External ear normal.  ?   Left Ear:  External ear normal.  ?   Nose: Nose normal.  ?   Mouth/Throat:  ?   Mouth: Mucous membranes are moist.  ?   Pharynx: Oropharynx is clear.  ?Eyes:  ?   General: Lids are normal.     ?   Right eye: No foreign body, discharge or hordeolum.  ?   Extraocular Movements: Extraocular movements intact.  ?   Conjunctiva/sclera:  ?   Right eye: Right conjunctiva is injected. No exudate or hemorrhage. ?   Left eye: Left conjunctiva is not injected.  ?Cardiovascular:  ?   Rate and Rhythm: Normal rate and regular rhythm.  ?   Pulses: Normal pulses.  ?   Heart sounds: Normal heart sounds.  ?Pulmonary:  ?   Effort: Pulmonary effort is normal.  ?   Breath sounds: Normal breath sounds.  ?Musculoskeletal:     ?   General: Normal range of motion.  ?   Cervical back: Normal range of motion and neck supple.  ?   Right lower leg: No edema.  ?   Left lower leg: No edema.  ?Skin: ?   General: Skin is warm and dry.  ?Neurological:  ?   General: No focal deficit present.  ?   Mental Status: She is alert and oriented to person, place, and time.  ?Psychiatric:     ?   Mood and Affect: Mood normal.     ?   Thought Content: Thought content normal.     ?   Judgment: Judgment normal.  ? ? ?BP (!) 133/8 (BP Location: Left Arm, Patient Position: Sitting, Cuff Size: Large)   Pulse 63   Temp 98.2 ?F (36.8 ?C) (Oral)   Resp 18   Ht 5' 7.5" (1.715 m)   Wt 263 lb (119.3 kg)   SpO2 95%   BMI 40.58 kg/m?  ?Wt Readings from Last 3 Encounters:  ?02/07/22 263 lb (119.3 kg)  ?02/03/22 265 lb 6.4 oz (120.4 kg)  ?02/01/22 270 lb  (122.5 kg)  ? ? ? ?Health Maintenance Due  ?Topic Date Due  ? Zoster Vaccines- Shingrix (1 of 2) Never done  ? COVID-19 Vaccine (3 - Booster for Pfizer series) 07/28/2020  ? DEXA SCAN  Never done  ? ? ?There are no

## 2022-02-07 NOTE — Progress Notes (Signed)
Patient has not eaten or taken medication today. ?Patient reports completing nystatin medication and thrush improving. ?Patient denies pain at this time. ?

## 2022-02-07 NOTE — Patient Instructions (Addendum)
To help with your allergic conjunctivitis, you are going to use Pataday drops once daily.  I also encourage you to start taking Zyrtec on a daily basis.  I sent those prescriptions to your pharmacy. ? ?I also sent a refill of your hydrochlorothiazide.  I do encourage you to continue checking your blood pressure on a daily basis, keeping a written log and having available for all office visits. ? ?Please let us know if there is anything else we can do for you. ? ?Kennieth Rad, PA-C ?Physician Assistant ?[place] ?[email or web address] ? ? ?Allergic Conjunctivitis, Adult ?Allergic conjunctivitis is inflammation of the conjunctiva. The conjunctiva is the thin, clear membrane that covers the white part of the eye and the inner surface of the eyelid. In this condition: ?The blood vessels in the conjunctiva become irritated and swell. ?The eyes become red or pink and feel itchy. ?Allergic conjunctivitis cannot be spread from person to person. This condition can develop at any age and may be outgrown. ?What are the causes? ?This condition is caused by allergens. These are things that can cause an allergic reaction in some people but not in other people. Common allergens include: ?Outdoor allergens, such as: ?Pollen, including pollen from grass and weeds. ?Mold spores. ?Car fumes. ?Indoor allergens, such as: ?Dust. ?Smoke. ?Mold spores. ?Proteins in a pet's urine, saliva, or dander. ?What increases the risk? ?You may be more likely to develop this condition if you have a family history of these things: ?Allergies. ?Conditions caused by being exposed to allergens, such as: ?Allergic rhinitis. This is an allergic reaction that affects the nose. ?Bronchial asthma. This condition affects the large airways in the lungs and makes breathing difficult. ?Atopic dermatitis (eczema). This is inflammation of the skin that is long-term (chronic). ?What are the signs or  symptoms? ?Symptoms of this condition include eyes that are: ?Itchy. ?Red. ?Watery. ?Puffy. ?Your eyes may also: ?Sting or burn. ?Have clear fluid draining from them. ?Have thick mucus discharge and pain (vernal conjunctivitis). ?How is this diagnosed? ?This condition may be diagnosed by: ?Your medical history. ?A physical exam. ?Tests of the fluid draining from your eyes to rule out other causes. ?Other tests to confirm the diagnosis, including: ?Testing for allergies. The skin may be pricked with a tiny needle. The pricked area is then exposed to small amounts of allergens. ?Testing for other eye conditions. Tests may include: ?Blood tests. ?Tissue scrapings from your eyelid. The tissue is then checked under a microscope. ?How is this treated? ?This condition may be treated with: ?Cold, wet cloths (cold compresses) to soothe itching and swelling. ?Washing the face to remove allergens. ?Eye drops. These may be prescription or over-the-counter. You may need to try different types to see which one works best for you, such as: ?Eye drops that block the allergic reaction (antihistamine). ?Eye drops that reduce swelling and irritation (anti-inflammatory). ?Steroid eye drops, which may be given if other treatments have not worked (vernal conjunctivitis). ?Oral antihistamine medicines. These are medicines taken by mouth to lessen your allergic reaction. You may need these if eye drops do not help or are difficult to use. ?Follow these instructions at home: ?Eye care ?Apply a clean, cold compress to your eyes for 10-20 minutes, 3-4 times a day. ?Do not touch or rub your eyes. ?Do not wear contact lenses until the inflammation is gone. Wear glasses instead. ?Do not wear eye makeup until the inflammation is gone. ?General instructions ?Avoid known allergens whenever possible. ?Take or  apply over-the-counter and prescription medicines only as told by your health care provider. These include any eye drops. ?Drink enough fluid  to keep your urine pale yellow. ?Keep all follow-up visits as told by your health care provider. This is important. ?Contact a health care provider if: ?Your symptoms get worse or do not get better with treatment. ?You have mild eye pain. ?You become sensitive to light. ?You have spots or blisters on your eyes. ?You have pus draining from your eyes. ?You have a fever. ?Get help right away if: ?You have redness, swelling, or other symptoms in only one eye. ?Your vision is blurred or you have other vision changes. ?You have severe eye pain. ?Summary ?Allergic conjunctivitis is inflammation of the clear membrane that covers the white part of the eye and the inner surface of the eyelid. ?Take or apply over-the-counter and prescription medicines only as told by your health care provider. These include eye drops. ?Do not touch or rub your eyes. ?Contact a health care provider if your symptoms get worse or do not get better with treatment. ?This information is not intended to replace advice given to you by your health care provider. Make sure you discuss any questions you have with your health care provider. ?Document Revised: 09/09/2019 Document Reviewed: 09/09/2019 ?Elsevier Patient Education ? [place]. ? ?

## 2022-02-08 ENCOUNTER — Other Ambulatory Visit: Payer: Self-pay

## 2022-02-08 DIAGNOSIS — Z122 Encounter for screening for malignant neoplasm of respiratory organs: Secondary | ICD-10-CM

## 2022-02-08 DIAGNOSIS — Z87891 Personal history of nicotine dependence: Secondary | ICD-10-CM

## 2022-02-21 ENCOUNTER — Encounter: Payer: Self-pay | Admitting: Physician Assistant

## 2022-02-21 ENCOUNTER — Ambulatory Visit (INDEPENDENT_AMBULATORY_CARE_PROVIDER_SITE_OTHER): Payer: Medicare Other | Admitting: Physician Assistant

## 2022-02-21 ENCOUNTER — Other Ambulatory Visit: Payer: Self-pay

## 2022-02-21 ENCOUNTER — Ambulatory Visit: Payer: Self-pay | Admitting: *Deleted

## 2022-02-21 VITALS — BP 109/76 | HR 86 | Temp 98.2°F | Resp 18 | Ht 69.0 in | Wt 263.0 lb

## 2022-02-21 DIAGNOSIS — R0602 Shortness of breath: Secondary | ICD-10-CM | POA: Diagnosis not present

## 2022-02-21 DIAGNOSIS — J029 Acute pharyngitis, unspecified: Secondary | ICD-10-CM

## 2022-02-21 MED ORDER — AZITHROMYCIN 250 MG PO TABS
ORAL_TABLET | ORAL | 0 refills | Status: DC
  Filled 2022-02-21: qty 6, 5d supply, fill #0

## 2022-02-21 NOTE — Patient Instructions (Signed)
You are going to take azithromycin to help with your throat pain.  I encourage you to continue staying hydrated, you can use Tylenol as needed for the discomfort. ? ?I hope that you feel better soon. ? ?Kennieth Rad, PA-C ?Physician Assistant ?[place] ?[email or web address] ? ? ?Pharyngitis ? ?Pharyngitis is inflammation of the throat (pharynx). It is a very common cause of sore throat. Pharyngitis can be caused by a bacteria, but it is usually caused by a virus. Most cases of pharyngitis get better on their own without treatment. ?What are the causes? ?This condition may be caused by: ?Infection by viruses (viral). Viral pharyngitis spreads easily from person to person (is contagious) through coughing, sneezing, and sharing of personal items or utensils such as cups, forks, spoons, and toothbrushes. ?Infection by bacteria (bacterial). Bacterial pharyngitis may be spread by touching the nose or face after coming in contact with the bacteria, or through close contact, such as kissing. ?Allergies. Allergies can cause buildup of mucus in the throat (post-nasal drip), leading to inflammation and irritation. Allergies can also cause blocked nasal passages, forcing breathing through the mouth, which dries and irritates the throat. ?What increases the risk? ?You are more likely to develop this condition if: ?You are 30-41 years old. ?You are exposed to crowded environments such as daycare, school, or dormitory living. ?You live in a cold climate. ?You have a weakened disease-fighting (immune) system. ?What are the signs or symptoms? ?Symptoms of this condition vary by the cause. Common symptoms of this condition include: ?Sore throat. ?Fatigue. ?Low-grade fever. ?Stuffy nose (nasal congestion) and cough. ?Headache. ?Other symptoms may include: ?Glands in the neck (lymph nodes) that are swollen. ?Skin rashes. ?Plaque-like film on the throat or tonsils. This is often  a symptom of bacterial pharyngitis. ?Vomiting. ?Red, itchy eyes (conjunctivitis). ?Loss of appetite. ?Joint pain and muscle aches. ?Enlarged tonsils. ?How is this diagnosed? ?This condition may be diagnosed based on your medical history and a physical exam. Your health care provider will ask you questions about your illness and your symptoms. ?A swab of your throat may be done to check for bacteria (rapid strep test). Other lab tests may also be done, depending on the suspected cause, but these are rare. ?How is this treated? ?Many times, treatment is not needed for this condition. Pharyngitis usually gets better in 3-4 days without treatment. ?Bacterial pharyngitis may be treated with antibiotic medicines. ?Follow these instructions at home: ?Medicines ?Take over-the-counter and prescription medicines only as told by your health care provider. ?If you were prescribed an antibiotic medicine, take it as told by your health care provider. Do not stop taking the antibiotic even if you start to feel better. ?Use throat sprays to soothe your throat as told by your health care provider. ?Children can get pharyngitis. Do not give your child aspirin because of the association with Reye's syndrome. ?Managing pain ?To help with pain, try: ?Sipping warm liquids, such as broth, herbal tea, or warm water. ?Eating or drinking cold or frozen liquids, such as frozen ice pops. ?Gargling with a mixture of salt and water 3-4 times a day or as needed. To make salt water, completely dissolve ?-1 tsp (3-6 g) of salt in 1 cup (237 mL) of warm water. ?Sucking on hard candy or throat lozenges. ?Putting a cool-mist humidifier in your bedroom at night to moisten the air. ?Sitting in the bathroom with the door closed for 5-10 minutes while you run hot water in the shower. ? ?General  instructions ? ?Do not use any products that contain nicotine or tobacco. These products include cigarettes, chewing tobacco, and vaping devices, such as  e-cigarettes. If you need help quitting, ask your health care provider. ?Rest as told by your health care provider. ?Drink enough fluid to keep your urine pale yellow. ?How is this prevented? ?To help prevent becoming infected or spreading infection: ?Wash your hands often with soap and water for at least 20 seconds. If soap and water are not available, use hand sanitizer. ?Do not touch your eyes, nose, or mouth with unwashed hands, and wash hands after touching these areas. ?Do not share cups or eating utensils. ?Avoid close contact with people who are sick. ?Contact a health care provider if: ?You have large, tender lumps in your neck. ?You have a rash. ?You cough up green, yellow-brown, or bloody mucus. ?Get help right away if: ?Your neck becomes stiff. ?You drool or are unable to swallow liquids. ?You cannot drink or take medicines without vomiting. ?You have severe pain that does not go away, even after you take medicine. ?You have trouble breathing, and it is not caused by a stuffy nose. ?You have new pain and swelling in your joints such as the knees, ankles, wrists, or elbows. ?These symptoms may represent a serious problem that is an emergency. Do not wait to see if the symptoms will go away. Get medical help right away. Call your local emergency services (911 in the [place] Do not drive yourself to the hospital. ?Summary ?Pharyngitis is redness, pain, and swelling (inflammation) of the throat (pharynx). ?While pharyngitis can be caused by a bacteria, the most common causes are viral. ?Most cases of pharyngitis get better on their own without treatment. ?Bacterial pharyngitis is treated with antibiotic medicines. ?This information is not intended to replace advice given to you by your health care provider. Make sure you discuss any questions you have with your health care provider. ?Document Revised: 01/13/2021 Document Reviewed: 01/13/2021 ?Elsevier Patient Education ? [place]. ? ?

## 2022-02-21 NOTE — Progress Notes (Signed)
? ?Established Patient Office Visit ? ?Subjective   ?Patient ID: Krystal Allen, female    DOB: November 11, 1955  Age: 66 y.o. MRN: [id number] ? ?Chief Complaint  ?Patient presents with  ? Dysphagia  ? ? ?States that she started having a sore throat and difficulty swallowing last night.  States that she feels something is stuck on the left side.  Does endorse pressure in her left ear, otherwise denies any other upper respiratory symptoms.  States that she has been able to swallow pills, but states that they "take a little while to go down".  States that she has not been able to eat since last night due to pain.  States that Tylenol is offering modest relief. ? ?Denies any sick contacts. ? ?Past Medical History:  ?Diagnosis Date  ? Allergy   ? COPD (chronic obstructive pulmonary disease) ([facility])   ? Depression   ? GERD (gastroesophageal reflux disease)   ? Hernia, abdominal   ? History of chicken pox   ? Hyperlipidemia   ? Hypertension   ? Internal hemorrhoids with Grade 3 prolapse and bleeding 06/19/2013  ? Osteoarthritis   ? Personal history of colonic adenomas 06/25/2013  ? ?Social History  ? ?Socioeconomic History  ? Marital status: Married  ?  Spouse name: Not on file  ? Number of children: Not on file  ? Years of education: 12+  ? Highest education level: Not on file  ?Occupational History  ? Occupation: PCA  ?  Employer: [employer]  ?Tobacco Use  ? Smoking status: Former  ?  Packs/day: 0.50  ?  Years: 40.00  ?  Pack years: 20.00  ?  Types: Cigarettes  ?  Quit date: 07/2021  ?  Years since quitting: 0.5  ? Smokeless tobacco: Never  ? Tobacco comments:  ?  Quit [id number]  ?Vaping Use  ? Vaping Use: Every day  ?Substance and Sexual Activity  ? Alcohol use: Yes  ?  Comment: occasional  ? Drug use: No  ? Sexual activity: Not Currently  ?Other Topics Concern  ? Not on file  ?Social History Narrative  ? Regular exercise-no Caffeine Use-  ? ?Social Determinants of Health  ? ?Financial Resource Strain: Not on file  ?Food  Insecurity: Not on file  ?Transportation Needs: Not on file  ?Physical Activity: Not on file  ?Stress: Not on file  ?Social Connections: Not on file  ?Intimate Partner Violence: Not on file  ? ?Family History  ?Problem Relation Age of Onset  ? Cancer Father   ?     prostate  ? Hypertension Mother   ? Diabetes Mother   ? Hypertension Sister   ? Heart disease Maternal Uncle   ? Stroke Maternal Grandmother   ? Hypertension Maternal Grandmother   ? Hypertension Sister   ? Colon cancer Neg Hx   ? Stomach cancer Neg Hx   ? Rectal cancer Neg Hx   ? Colon polyps Neg Hx   ? ?Allergies  ?Allergen Reactions  ? Sulfa Antibiotics Anaphylaxis and Hives  ? Chantix [Varenicline Tartrate]   ?  Bad dreams  ? Fish Allergy Other (See Comments)  ?  Severe stomach cramps   ? Hygroton [Chlorthalidone]   ? Penicillins Other (See Comments)  ?  Has patient had a PCN reaction causing immediate rash, facial/tongue/throat swelling, SOB or lightheadedness with hypotension: No ?Has patient had a PCN reaction causing severe rash involving mucus membranes or skin necrosis: No ?Has patient had a PCN reaction  that required hospitalization No ?Has patient had a PCN reaction occurring within the last 10 years: No ?If all of the above answers are "NO", then may proceed with Cephalosporin use. ? ?Welts   ? Latex Rash and Other (See Comments)  ?  Dry skin  ? ?  ? ?Review of Systems  ?Constitutional:  Negative for chills and fever.  ?HENT:  Positive for sore throat. Negative for congestion and sinus pain.   ?Eyes: Negative.   ?Respiratory:  Negative for cough, shortness of breath and stridor.   ?Cardiovascular:  Negative for chest pain.  ?Gastrointestinal:  Negative for abdominal pain, nausea and vomiting.  ?Genitourinary: Negative.   ?Musculoskeletal:  Negative for myalgias.  ?Skin: Negative.   ?Neurological: Negative.   ?Endo/Heme/Allergies: Negative.   ?Psychiatric/Behavioral: Negative.    ? ?  ?Objective:  ?  ? ?BP 109/76 (BP Location: Left Arm,  Patient Position: Sitting, Cuff Size: Normal)   Pulse 86   Temp 98.2 ?F (36.8 ?C) (Oral)   Resp 18   Ht '5\' 9"'$  (1.753 m)   Wt 263 lb (119.3 kg)   SpO2 92%   BMI 38.84 kg/m?  ? ? ?Physical Exam ?Vitals and nursing note reviewed.  ?Constitutional:   ?   General: She is not in acute distress. ?   Appearance: Normal appearance.  ?HENT:  ?   Head: Normocephalic.  ?   Salivary Glands: Left salivary gland is diffusely enlarged and tender.  ?   Right Ear: Tympanic membrane, ear canal and external ear normal.  ?   Left Ear: Tympanic membrane, ear canal and external ear normal.  ?   Nose: Nose normal.  ?   Right Sinus: No maxillary sinus tenderness or frontal sinus tenderness.  ?   Left Sinus: No maxillary sinus tenderness or frontal sinus tenderness.  ?   Mouth/Throat:  ?   Lips: Pink.  ?   Mouth: Mucous membranes are moist.  ?   Pharynx: Oropharyngeal exudate and posterior oropharyngeal erythema present.  ?Cardiovascular:  ?   Rate and Rhythm: Normal rate and regular rhythm.  ?   Pulses: Normal pulses.  ?   Heart sounds: Normal heart sounds.  ?Pulmonary:  ?   Effort: Pulmonary effort is normal.  ?   Breath sounds: Normal breath sounds.  ?Musculoskeletal:     ?   General: Normal range of motion.  ?   Cervical back: Normal range of motion and neck supple. Tenderness present.  ?Lymphadenopathy:  ?   Head:  ?   Right side of head: Submandibular adenopathy present.  ?   Left side of head: Submandibular and preauricular adenopathy present.  ?Skin: ?   General: Skin is warm and dry.  ?Neurological:  ?   General: No focal deficit present.  ?   Mental Status: She is alert and oriented to person, place, and time.  ?Psychiatric:     ?   Mood and Affect: Mood normal.     ?   Behavior: Behavior normal.     ?   Thought Content: Thought content normal.     ?   Judgment: Judgment normal.  ? ? ? ?  ?Assessment & Plan:  ? ?Problem List Items Addressed This Visit   ?None ?Visit Diagnoses   ? ? Acute pharyngitis, unspecified etiology     -  Primary  ? Relevant Medications  ? azithromycin (ZITHROMAX) 250 MG tablet  ? ?  ? ?1. Acute pharyngitis, unspecified etiology ?Trial azithromycin.  Patient education given on supportive care.  Red flags for prompt reevaluation. ? ?Given patient's recent allergic reaction emergency department visit and follow-up with this provider, will have patient follow-up with this provider in 2 days to ensure resolution.  This does appear to be new illness versus continued sequela of allergic reaction. ?- azithromycin (ZITHROMAX) 250 MG tablet; Take 2 tablets by mouth on day 1, then take 1 tablet once daily  Dispense: 6 tablet; Refill: 0 ? ? ? ?I have reviewed the patient's medical history (PMH, PSH, Social History, Family History, Medications, and allergies) , and have been updated if relevant. I spent 30 minutes reviewing chart and  face to face time with patient. ? ? ?Return in about 2 days (around 02/23/2022) for with me.  ? ? ?Rochella Benner S Mayers, PA-C ? ?

## 2022-02-21 NOTE — Progress Notes (Signed)
Patient reports throat pain and difficulty swallowing last night. Cough drops provided no relief for patient. ?

## 2022-02-21 NOTE — Telephone Encounter (Signed)
?  Chief Complaint: Difficulty swallowing ?Symptoms: Difficulty swallowing ?Frequency: yesterday ?Pertinent Negatives: Patient denies Sore throat, but later in assessment stated "Hurts".  Denies any SOB. States can swallow 'But I have to hold it in my mouth for a while." ?Disposition: '[]'$ ED /'[]'$ Urgent Care (no appt availability in office) / '[x]'$ Appointment(In office/virtual)/ '[]'$  [facility]/ '[]'$ Home Care/ '[]'$ Refused Recommended Disposition /'[]'$ [facility]/ '[]'$  Follow-up with PCP ?Additional Notes: Seen 02/07/22 for allergic reaction. Appt secured for today. ?Reason for Disposition ? SEVERE (e.g., excruciating) throat pain ?   Difficulty swallowing, throat "Hurts" Evasive historian. ? ?Answer Assessment - Initial Assessment Questions ?1. ONSET: "When did the throat start hurting?" (Hours or days ago)  ?    YESTERDAY ?2. SEVERITY: "How bad is the sore throat?" (Scale 1-10; mild, moderate or severe) ?  - MILD (1-3):  doesn't interfere with eating or normal activities ?  - MODERATE (4-7): interferes with eating some solids and normal activities ?  - SEVERE (8-10):  excruciating pain, interferes with most normal activities ?  - SEVERE DYSPHAGIA: can't swallow liquids, drooling ?    "Hurts" ?3. STREP EXPOSURE: "Has there been any exposure to strep within the past week?" If Yes, ask: "What type of contact occurred?"  ?     ?4.  VIRAL SYMPTOMS: "Are there any symptoms of a cold, such as a runny nose, cough, hoarse voice or red eyes?"  ?     ?5. FEVER: "Do you have a fever?" If Yes, ask: "What is your temperature, how was it measured, and when did it start?" ?     ?6. PUS ON THE TONSILS: "Is there pus on the tonsils in the back of your throat?" ?    Unsure ?7. OTHER SYMPTOMS: "Do you have any other symptoms?" (e.g., difficulty breathing, headache, rash) ?    "Something feels stuck" ? ?Protocols used: Sore Throat-A-AH ? ?

## 2022-02-23 ENCOUNTER — Ambulatory Visit: Payer: Medicare Other | Admitting: Pulmonary Disease

## 2022-02-23 NOTE — Progress Notes (Deleted)
Subjective:   PATIENT ID: Krystal Allen GENDER: female DOB: February 25, 1956, MRN: [id number]   HPI  No chief complaint on file.   Reason for Visit: Follow-up  Ms. Krystal Allen is a 66 year old female former smoker with emphysema, chronic hypoxemic respiratory failure, Pulmonary Langerhans cell histiocytosis, hypertension, hyperlipidemia who presents as new consult.  Synopsis She was hospitalized in October 2022 for COPD exacerbation secondary to influenza. She quit smoking since then and has done well. She does notice she is snacking more in place of her nicotine craving. She has shortness of breath with wheezing. Wheezing occurs at night sometimes. No chronic cough. Symptoms worsen with changes in weather and will use her rescue inhaler 1-2 a week. She is compliant with Breo 200 daily. She wears her oxygen when she feels short of breath. She checks her O2 levels which usually go down to 89%. She reports good quality of sleep >8 hours. She does snore. Her roommate does not report witnessed apnea or gasping. No morning headaches or fatigue.   02/23/22 Since her last visit she has been compliant with her Breo and Spiriva.  Her PCP is looking into her portable oxygen concentrator.  She had completed a walk with [facility] and reportedly qualified.  She recently had allergic reaction and was seen in the ED earlier this month.  Unclear the cause but she had recently had her antihypertensives changed.  2 days ago she was seen by family med PA for pharyngitis and given azithromycin.  Social History: Former smoker. 1/2 ppd x 40 years [employer] x 33 years   Past Medical History:  Diagnosis Date   Allergy    COPD (chronic obstructive pulmonary disease) ([facility])    Depression    GERD (gastroesophageal reflux disease)    Hernia, abdominal    History of chicken pox    Hyperlipidemia    Hypertension    Internal hemorrhoids with Grade 3 prolapse and bleeding 06/19/2013   Osteoarthritis    Personal  history of colonic adenomas 06/25/2013     Family History  Problem Relation Age of Onset   Cancer Father        prostate   Hypertension Mother    Diabetes Mother    Hypertension Sister    Heart disease Maternal Uncle    Stroke Maternal Grandmother    Hypertension Maternal Grandmother    Hypertension Sister    Colon cancer Neg Hx    Stomach cancer Neg Hx    Rectal cancer Neg Hx    Colon polyps Neg Hx      Social History   Occupational History   Occupation: PCA    Employer: [employer]  Tobacco Use   Smoking status: Former    Packs/day: 0.50    Years: 40.00    Pack years: 20.00    Types: Cigarettes    Quit date: 07/2021    Years since quitting: 0.5   Smokeless tobacco: Never   Tobacco comments:    Quit 102022  Vaping Use   Vaping Use: Every day  Substance and Sexual Activity   Alcohol use: Yes    Comment: occasional   Drug use: No   Sexual activity: Not Currently    Allergies  Allergen Reactions   Sulfa Antibiotics Anaphylaxis and Hives   Chantix [Varenicline Tartrate]     Bad dreams   Fish Allergy Other (See Comments)    Severe stomach cramps    Hygroton [Chlorthalidone]    Penicillins Other (See Comments)  Has patient had a PCN reaction causing immediate rash, facial/tongue/throat swelling, SOB or lightheadedness with hypotension: No Has patient had a PCN reaction causing severe rash involving mucus membranes or skin necrosis: No Has patient had a PCN reaction that required hospitalization No Has patient had a PCN reaction occurring within the last 10 years: No If all of the above answers are "NO", then may proceed with Cephalosporin use.  Welts    Latex Rash and Other (See Comments)    Dry skin     Outpatient Medications Prior to Visit  Medication Sig Dispense Refill   acetaminophen (TYLENOL 8 HOUR) 650 MG CR tablet Take 1 tablet (650 mg total) by mouth 2 (two) times daily as needed for pain. 60 tablet 5   albuterol (PROAIR HFA) 108 (90 Base)  MCG/ACT inhaler Inhale 2 puffs into the lungs every 4 (four) hours as needed for wheezing or shortness of breath. 8.5 g 2   albuterol (PROVENTIL) (2.5 MG/3ML) 0.083% nebulizer solution TAKE 3 MLS BY NEBULIZATION EVERY 6 (SIX) HOURS AS NEEDED FOR WHEEZING OR SHORTNESS OF BREATH. 90 mL 1   amLODipine (NORVASC) 5 MG tablet Take 1 tablet (5 mg total) by mouth daily. 30 tablet 5   aspirin EC 81 MG tablet Take 1 tablet (81 mg total) by mouth daily. 100 tablet 1   atorvastatin (LIPITOR) 20 MG tablet TAKE 1 TABLET (20 MG TOTAL) BY MOUTH DAILY. 90 tablet 1   azithromycin (ZITHROMAX) 250 MG tablet Take 2 tablets by mouth on day 1, then take 1 tablet once daily 6 tablet 0   cetirizine (ZYRTEC) 10 MG tablet Take 1 tablet (10 mg total) by mouth daily. 30 tablet 11   Elastic Bandages & Supports (MEDICAL COMPRESSION STOCKINGS) MISC R60.0 1 each 0   fluticasone furoate-vilanterol (BREO ELLIPTA) 200-25 MCG/ACT AEPB Inhale 1 puff into the lungs daily. 60 each 5   hydrochlorothiazide (HYDRODIURIL) 25 MG tablet Take 1 tablet (25 mg total) by mouth daily. 30 tablet 1   Olopatadine HCl 0.2 % SOLN Apply 1 drop to eye daily. 2.5 mL 1   omeprazole (PRILOSEC) 20 MG capsule Take 1 capsule (20 mg total) by mouth daily. 30 capsule 3   oxybutynin (DITROPAN) 5 MG tablet Take 1 tablet (5 mg total) by mouth 2 (two) times daily. (Patient not taking: Reported on 02/07/2022) 60 tablet 3   Respiratory Therapy Supplies (FLUTTER) DEVI Use as directed 1 each 0   sodium chloride (OCEAN) 0.65 % nasal spray Place 2 sprays into both nostrils every 4 (four) hours. 44 mL 0   Tiotropium Bromide Monohydrate (SPIRIVA RESPIMAT) 2.5 MCG/ACT AERS Inhale 2 puffs into the lungs daily. 4 g 5   No facility-administered medications prior to visit.    ROS   Objective:   There were no vitals filed for this visit.    Physical Exam: General: Well-appearing, no acute distress HENT: [place], AT Eyes: EOMI, no scleral icterus Respiratory: Clear to  auscultation bilaterally.  No crackles, wheezing or rales Cardiovascular: RRR, -M/R/G, no JVD Extremities:-Edema,-tenderness Neuro: AAO x4, CNII-XII grossly intact Psych: Normal mood, normal affect  Data Reviewed:  Imaging: CT 12/15/17 - Mild emphysema. Numerous pulmonary nodules and thin walled cysts bilaterally. Larged lung nodule is 6 mm in the RUL CXR 08/19/21 - Normal. No infiltrate, effusion or edema. CT lung screen 02/02/22 -tiny pulmonary nodule in the left upper lobe. Mild centrilobular and paraseptal emphysema. Unable to visualize prior thin walled cysts/nodules due to lack of high resolution  PFT:  01/26/21 FVC 2.99 (97%) FEV1 2.49 (103%) Ratio 79  TLC 88% DLCO 53% Interpretation: Normal spirometry and lung volumes. No significant BD response present. Moderately reduced gas exchange.  Labs: CBC    Component Value Date/Time   WBC 7.3 02/01/2022 1559   RBC 5.45 (H) 02/01/2022 1559   HGB 15.8 (H) 02/01/2022 1559   HGB 14.1 01/27/2021 1009   HCT 48.4 (H) 02/01/2022 1559   HCT 42.5 01/27/2021 1009   PLT 238 02/01/2022 1559   PLT 261 01/27/2021 1009   MCV 88.8 02/01/2022 1559   MCV 89 01/27/2021 1009   MCH 29.0 02/01/2022 1559   MCHC 32.6 02/01/2022 1559   RDW 17.0 (H) 02/01/2022 1559   RDW 15.4 01/27/2021 1009   LYMPHSABS 1.3 02/01/2022 1559   LYMPHSABS 2.2 02/10/2020 1000   MONOABS 0.3 02/01/2022 1559   EOSABS 0.0 02/01/2022 1559   EOSABS 0.2 02/10/2020 1000   BASOSABS 0.0 02/01/2022 1559   BASOSABS 0.1 02/10/2020 1000   ONO 12/08/21 - SpO2 <88% for 2hr 33 min 17 sec. Nadir SpO2 79%     Assessment & Plan:   Discussion: 66 year old female former smoker with emphysema, nocturnal hypoxemia, pulmonary Langerhans' cell histiocytosis, hypertension, hyperlipidemia who presents for follow-up. Reviewed PFTs  Emphysema --CONTINUE Breo 200-25 mcg ONE puff ONCE a day. REFILL --START Spiriva 2.5 mcg TWO puffs ONCE a day --REFER to Lung Screen Programming  Nocturnal  hypoxemia Chronic hypoxemic respiratory failure May be related to undiagnosed OSA vs V/Q mismatch --CONTINUE 2L O2 at night. Needs to be continuous --**Ambulatory O2  Pulmonary Langerhans cell histiocytosis Moderately reduced DLCO --ARRANGE for pulmonary function tests prior to next visit  Morbid obesity BMI 41.66 --We discussed healthy diet and regular aerobic exercise --Referral to weight management Consider [email or web address]  Health Maintenance Immunization History  Administered Date(s) Administered   Influenza Split 07/10/2017   Influenza,inj,Quad PF,6+ Mos 08/14/2014, 09/14/2020, 09/13/2021   PFIZER(Purple Top)SARS-COV-2 Vaccination 05/12/2020, 06/02/2020   PNEUMOCOCCAL CONJUGATE-20 08/24/2021   Tdap 08/14/2014   CT Lung Screen- Enrolled. Due 07/2022  No orders of the defined types were placed in this encounter.  No orders of the defined types were placed in this encounter.   No follow-ups on file.  I have spent a total time of 45-minutes on the day of the appointment reviewing prior documentation, coordinating care and discussing medical diagnosis and plan with the patient/family. Imaging, labs and tests included in this note have been reviewed and interpreted independently by me.  [place], MD [facility] 02/23/2022 8:03 AM  Office Number [phone]

## 2022-03-08 ENCOUNTER — Ambulatory Visit: Payer: Medicare Other | Attending: Internal Medicine | Admitting: Internal Medicine

## 2022-03-08 ENCOUNTER — Ambulatory Visit: Payer: Medicare Other | Attending: Internal Medicine | Admitting: Pharmacist

## 2022-03-08 ENCOUNTER — Other Ambulatory Visit (HOSPITAL_COMMUNITY): Payer: Self-pay

## 2022-03-08 ENCOUNTER — Encounter: Payer: Self-pay | Admitting: Internal Medicine

## 2022-03-08 ENCOUNTER — Other Ambulatory Visit: Payer: Self-pay

## 2022-03-08 VITALS — BP 127/96 | HR 57 | Temp 98.3°F | Resp 24 | Ht 67.5 in | Wt 275.0 lb

## 2022-03-08 DIAGNOSIS — R682 Dry mouth, unspecified: Secondary | ICD-10-CM

## 2022-03-08 DIAGNOSIS — R600 Localized edema: Secondary | ICD-10-CM | POA: Diagnosis not present

## 2022-03-08 DIAGNOSIS — N184 Chronic kidney disease, stage 4 (severe): Secondary | ICD-10-CM

## 2022-03-08 DIAGNOSIS — I10 Essential (primary) hypertension: Secondary | ICD-10-CM | POA: Diagnosis not present

## 2022-03-08 DIAGNOSIS — I700 Atherosclerosis of aorta: Secondary | ICD-10-CM | POA: Diagnosis not present

## 2022-03-08 DIAGNOSIS — Z79899 Other long term (current) drug therapy: Secondary | ICD-10-CM

## 2022-03-08 DIAGNOSIS — R7303 Prediabetes: Secondary | ICD-10-CM | POA: Diagnosis not present

## 2022-03-08 DIAGNOSIS — R0609 Other forms of dyspnea: Secondary | ICD-10-CM

## 2022-03-08 LAB — POCT GLYCOSYLATED HEMOGLOBIN (HGB A1C): HbA1c, POC (prediabetic range): 6.2 % (ref 5.7–6.4)

## 2022-03-08 MED ORDER — SEMAGLUTIDE-WEIGHT MANAGEMENT 0.25 MG/0.5ML ~~LOC~~ SOAJ
0.2500 mg | SUBCUTANEOUS | 2 refills | Status: DC
  Filled 2022-03-08 (×3): qty 2, 28d supply, fill #0

## 2022-03-08 MED ORDER — HYDRALAZINE HCL 10 MG PO TABS
10.0000 mg | ORAL_TABLET | Freq: Two times a day (BID) | ORAL | 5 refills | Status: DC
  Filled 2022-03-08 (×2): qty 60, 30d supply, fill #0
  Filled 2022-04-11: qty 60, 30d supply, fill #1
  Filled 2022-05-23: qty 60, 30d supply, fill #2
  Filled 2022-07-19: qty 60, 30d supply, fill #3
  Filled 2022-09-30: qty 60, 30d supply, fill #4
  Filled 2022-12-30: qty 60, 30d supply, fill #5

## 2022-03-08 NOTE — Progress Notes (Addendum)
? ? ?Patient ID: Krystal Allen, female    DOB: [birth year]  MRN: [id number] ? ?CC: 2 mth f/u ? ?Subjective: ?Krystal Allen is a 66 y.o. female who presents for 2 mth f/u ?Her concerns today include:  ?Pt with hx of HTN, HL, aortic atherosclerosis, COPD, ILD, GERD, CKD 2-3, preDM/obesity, tob dep, DJD c-spine, spinal stenosis, stress incont, adenomatous colon polyp. ? ?HTN: On last visit with me 01/11/22, patient complained of pedal edema that gets worse as the day progresses.  I recommended decreasing amlodipine from 10 mg  to 5 mg daily and we increased HCTZ to 25 mg. BNP normal. ?-Patient called 01/25/2022 to report that she still has swelling in the ankles and feet that is worse.  I recommended compression socks.  Given virtual appointment with our NP Geryl Rankins the same day.  Encouraged dietary compliance and changed HCTZ to chlorthalidone 25 mg daily.   ?-01/31/2022: Patient called RN at [facility] to report red splotches on her back since being on the chlorthalidone.  Subsequently seen at urgent care on 02/01/2022 for itchy rash on upper extremities and back.  Given Solu-Medrol and sent to the emergency room. ?-In the emergency room she was assessed to have urticaria of the posterior trunk.  Advised to stop the new blood pressure medication which was chlorthalidone but for some reason was reported as clonidine which was never prescribed.  Also complained of chest pain at the ER.  Work-up: EKG without ischemic changes.  Negative troponin.  Chest x-ray okay.  GFR noted to be worse than previous at 29 with creatinine of 1.91. ?-02/03/2022: Subsequently saw PA Cari Mayers in follow-up from the ER.  Reported that she was feeling better but still felt different.  Complained of burning in her mouth and thought that she had thrush.  Chlorthalidone discontinued.  Told to restart amlodipine 5 mg and hydrochlorothiazide 25 mg.  Placed on nystatin suspension for oral thrush. ?-Seen by the same PA on 2 additional visits.  1 she was  assessed to have allergies and was prescribed allergy eyedrop and Zyrtec.  On the most recent visit 01/2023/23, complained of difficulty swallowing.  Assessed to have pharyngitis and was prescribed antibiotics. ? ?Today: ?Patient tells me that I prescribed the clonidine that almost killed her.  I think she is referring to chlorthalidone that has since been discontinued. ?Tolerating HCTZ 25 and Norvasc 5 mg ?Checks BP 1-2 x a wk and gives range 120s/80-90s.  Did not take meds as yet for the morning.   ?Endorses salt restrictions. ?Started having pedal and ankle edema again ? ?Obesity: On next visit, patient requested diet pills to help with weight loss.  She endorsed snacking on things like chips, cookies and candies.  She had started going to the gym 2 times a week.  I discussed dietary changes and referred her to the nutritionist. ?-She was called by the nutritionist and informed that her insurance will not pay for her to be seen unless she has diabetes or CKD in addition to obesity. ?-Today she reports that she has not eaten much since she had broken out from the blood pressure medication.  "Why you'll think everything coming from eating."  Continues to gain weight.  She is up 27 lbs since 08/2021.  She feels she is retaining water ? ?-reports feeling SOB all the time.  "I can not walk from here to there without feeling SOB." ?She has known COPD and ILD.  Uses her O2 "when I have to use  it.  Night time or when I'm out of breath or whatever."  On average she uses the O2 about every other day.  ?Using Breo and Spiriva.  Not sure if they help.   ?No CP ?Recent low-dose CT of chest done for lung cancer screening revealed incidental finding of aortic arthrosclerosis and two-vessel CAD.   ? ?C/O Dry mouth especially at nights x 2-3 mths ?Drinks a lot of fluids because of this ?Not sure if she has dry eyes as well ? ?CKD: previously in range for CKD 2-3.  Most recent labs done through ER showed decline in GFR from >60 to  29 and Creat increase from 1.39 to 1.91 ?In inquired about NSAID use.  She tells me she was taking Motrin for about 1 wk when she was having swelling in throat.  About 2 x a day.  Last took last wk.  ?Patient Active Problem List  ? Diagnosis Date Noted  ? Seasonal allergies 02/07/2022  ? Mastitis, right, acute 12/23/2021  ? Influenza vaccine needed 09/14/2020  ? Tobacco dependence 05/12/2020  ? Aortic atherosclerosis ([place]) 05/12/2020  ? Adenomatous polyp of colon 05/12/2020  ? Langerhan's cell histiocytosis ([place]) 01/26/2018  ? Cigarette smoker 01/26/2018  ? Osteoarthritis of cervical spine 12/22/2017  ? Prediabetes 12/22/2017  ? Pain of left calf 04/06/2017  ? Centrilobular emphysema ([place]) 02/23/2016  ? Obesity (BMI 30-39.9) 01/15/2016  ? Bilateral shoulder pain 01/15/2016  ? Esophageal reflux 01/15/2016  ? Tobacco abuse 05/13/2015  ? Lung nodule 08/14/2014  ? Impaired fasting glucose 08/14/2014  ? Hyperlipidemia 08/14/2014  ? Personal history of colonic adenomas 06/25/2013  ? HTN (hypertension) 05/20/2013  ?  ? ?Current Outpatient Medications on File Prior to Visit  ?Medication Sig Dispense Refill  ? acetaminophen (TYLENOL 8 HOUR) 650 MG CR tablet Take 1 tablet (650 mg total) by mouth 2 (two) times daily as needed for pain. 60 tablet 5  ? albuterol (PROAIR HFA) 108 (90 Base) MCG/ACT inhaler Inhale 2 puffs into the lungs every 4 (four) hours as needed for wheezing or shortness of breath. 8.5 g 2  ? albuterol (PROVENTIL) (2.5 MG/3ML) 0.083% nebulizer solution TAKE 3 MLS BY NEBULIZATION EVERY 6 (SIX) HOURS AS NEEDED FOR WHEEZING OR SHORTNESS OF BREATH. 90 mL 1  ? aspirin EC 81 MG tablet Take 1 tablet (81 mg total) by mouth daily. 100 tablet 1  ? atorvastatin (LIPITOR) 20 MG tablet TAKE 1 TABLET (20 MG TOTAL) BY MOUTH DAILY. 90 tablet 1  ? cetirizine (ZYRTEC) 10 MG tablet Take 1 tablet (10 mg total) by mouth daily. 30 tablet 11  ? Elastic Bandages & Supports (MEDICAL COMPRESSION STOCKINGS) MISC R60.0 1 each 0  ?  fluticasone furoate-vilanterol (BREO ELLIPTA) 200-25 MCG/ACT AEPB Inhale 1 puff into the lungs daily. 60 each 5  ? hydrochlorothiazide (HYDRODIURIL) 25 MG tablet Take 1 tablet (25 mg total) by mouth daily. 30 tablet 1  ? Olopatadine HCl 0.2 % SOLN Apply 1 drop to eye daily. 2.5 mL 1  ? omeprazole (PRILOSEC) 20 MG capsule Take 1 capsule (20 mg total) by mouth daily. 30 capsule 3  ? Respiratory Therapy Supplies (FLUTTER) DEVI Use as directed 1 each 0  ? Tiotropium Bromide Monohydrate (SPIRIVA RESPIMAT) 2.5 MCG/ACT AERS Inhale 2 puffs into the lungs daily. 4 g 5  ? oxybutynin (DITROPAN) 5 MG tablet Take 1 tablet (5 mg total) by mouth 2 (two) times daily. (Patient not taking: Reported on 02/07/2022) 60 tablet 3  ? sodium chloride (OCEAN)  0.65 % nasal spray Place 2 sprays into both nostrils every 4 (four) hours. (Patient not taking: Reported on 03/08/2022) 44 mL 0  ? ?No current facility-administered medications on file prior to visit.  ? ? ?Allergies  ?Allergen Reactions  ? Sulfa Antibiotics Anaphylaxis and Hives  ? Chantix [Varenicline Tartrate]   ?  Bad dreams  ? Fish Allergy Other (See Comments)  ?  Severe stomach cramps   ? Hygroton [Chlorthalidone]   ? Penicillins Other (See Comments)  ?  Has patient had a PCN reaction causing immediate rash, facial/tongue/throat swelling, SOB or lightheadedness with hypotension: No ?Has patient had a PCN reaction causing severe rash involving mucus membranes or skin necrosis: No ?Has patient had a PCN reaction that required hospitalization No ?Has patient had a PCN reaction occurring within the last 10 years: No ?If all of the above answers are "NO", then may proceed with Cephalosporin use. ? ?Welts   ? Latex Rash and Other (See Comments)  ?  Dry skin  ? ? ?Social History  ? ?Socioeconomic History  ? Marital status: Married  ?  Spouse name: Not on file  ? Number of children: Not on file  ? Years of education: 12+  ? Highest education level: Not on file  ?Occupational History  ?  Occupation: PCA  ?  Employer: [employer]  ?Tobacco Use  ? Smoking status: Former  ?  Packs/day: 0.50  ?  Years: 40.00  ?  Pack years: 20.00  ?  Types: Cigarettes  ?  Quit date: 07/2021  ?  Years since quitting: 0.

## 2022-03-08 NOTE — Progress Notes (Signed)
Patient was educated on the use of the [facility] pen. Reviewed necessary supplies and operation of the pen. Also reviewed goal blood glucose levels. Patient was able to demonstrate use. All questions and concerns were addressed. ? ?Krystal Allen, PharmD, BCACP, CPP ?Clinical Pharmacist ?[place] ?[phone]  ?

## 2022-03-08 NOTE — Progress Notes (Signed)
F/u allergic reaction  ?Concerns about SOB and weight gain ?Weight gain of 263 and now 273lbs ?

## 2022-03-09 LAB — SJOGREN'S SYNDROME ANTIBODS(SSA + SSB)
ENA SSA (RO) Ab: <0.2 AI (ref 0.0–0.9)
ENA SSB (LA) Ab: <0.2 AI (ref 0.0–0.9)

## 2022-03-09 LAB — BASIC METABOLIC PANEL
BUN/Creatinine Ratio: 12 (ref 12–28)
BUN: 17 mg/dL (ref 8–27)
CO2: 22 mmol/L (ref 20–29)
Calcium: 9 mg/dL (ref 8.7–10.3)
Chloride: 108 mmol/L — ABNORMAL HIGH (ref 96–106)
Creatinine, Ser: 1.38 mg/dL — ABNORMAL HIGH (ref 0.57–1.00)
Glucose: 95 mg/dL (ref 70–99)
Potassium: 4.5 mmol/L (ref 3.5–5.2)
Sodium: 145 mmol/L — ABNORMAL HIGH (ref 134–144)
eGFR: 42 mL/min/{1.73_m2} — ABNORMAL LOW (ref >59)

## 2022-03-09 LAB — TSH+T4F+T3FREE
Free T4: 1.1 ng/dL (ref 0.82–1.77)
T3, Free: 2.4 pg/mL (ref 2.0–4.4)
TSH: 3.49 u[IU]/mL (ref 0.450–4.500)

## 2022-03-09 LAB — MICROALBUMIN / CREATININE URINE RATIO
Creatinine, Urine: 71.1 mg/dL
Microalb/Creat Ratio: 67 mg/g creat — ABNORMAL HIGH (ref 0–29)
Microalbumin, Urine: 47.3 ug/mL

## 2022-03-10 ENCOUNTER — Encounter (HOSPITAL_COMMUNITY): Payer: Self-pay

## 2022-03-10 ENCOUNTER — Encounter: Payer: Self-pay | Admitting: Internal Medicine

## 2022-03-10 ENCOUNTER — Ambulatory Visit (INDEPENDENT_AMBULATORY_CARE_PROVIDER_SITE_OTHER): Payer: Medicare Other | Admitting: Internal Medicine

## 2022-03-10 ENCOUNTER — Other Ambulatory Visit: Payer: Self-pay

## 2022-03-10 VITALS — BP 162/90 | HR 74 | Ht 67.5 in | Wt 276.0 lb

## 2022-03-10 DIAGNOSIS — I2510 Atherosclerotic heart disease of native coronary artery without angina pectoris: Secondary | ICD-10-CM

## 2022-03-10 DIAGNOSIS — I700 Atherosclerosis of aorta: Secondary | ICD-10-CM

## 2022-03-10 DIAGNOSIS — N183 Chronic kidney disease, stage 3 unspecified: Secondary | ICD-10-CM

## 2022-03-10 DIAGNOSIS — I10 Essential (primary) hypertension: Secondary | ICD-10-CM | POA: Diagnosis not present

## 2022-03-10 DIAGNOSIS — R0600 Dyspnea, unspecified: Secondary | ICD-10-CM | POA: Diagnosis not present

## 2022-03-10 DIAGNOSIS — R072 Precordial pain: Secondary | ICD-10-CM

## 2022-03-10 DIAGNOSIS — E785 Hyperlipidemia, unspecified: Secondary | ICD-10-CM | POA: Diagnosis not present

## 2022-03-10 MED ORDER — METOPROLOL TARTRATE 100 MG PO TABS
100.0000 mg | ORAL_TABLET | Freq: Once | ORAL | 0 refills | Status: DC
  Filled 2022-03-10: qty 1, 1d supply, fill #0

## 2022-03-10 MED ORDER — ATORVASTATIN CALCIUM 40 MG PO TABS
40.0000 mg | ORAL_TABLET | Freq: Every day | ORAL | 3 refills | Status: DC
  Filled 2022-03-10: qty 90, 90d supply, fill #0

## 2022-03-10 MED ORDER — FUROSEMIDE 20 MG PO TABS
20.0000 mg | ORAL_TABLET | Freq: Every day | ORAL | 3 refills | Status: DC
  Filled 2022-03-10: qty 90, 90d supply, fill #0
  Filled 2022-05-23: qty 90, 90d supply, fill #1
  Filled 2022-09-17: qty 90, 90d supply, fill #2
  Filled 2023-01-17: qty 90, 90d supply, fill #3

## 2022-03-10 NOTE — Patient Instructions (Signed)
Medication Instructions:  ?Your physician has recommended you make the following change in your medication:  ?1.) increase atorvastatin (Lipitor) to 40 mg - one tablet daily ?2.) start furosemide (Lasix) 20 mg - one tablet daily ? ?*If you need a refill on your cardiac medications before your next appointment, please call your pharmacy* ? ? ?Lab Work: ?Please return in a week: BMET ?Please return in 2 months: Lipids/Liver/Lp(a) ? ?If you have labs (blood work) drawn today and your tests are completely normal, you will receive your results only by: ?MyChart Message (if you have MyChart) OR ?A paper copy in the mail ?If you have any lab test that is abnormal or we need to change your treatment, we will call you to review the results. ? ? ?Testing/Procedures: ?Your physician has requested that you have an echocardiogram. Echocardiography is a painless test that uses sound waves to create images of your heart. It provides your doctor with information about the size and shape of your heart and how well your heart?s chambers and valves are working. This procedure takes approximately one hour. There are no restrictions for this procedure. ? ?Cardiac CTA - see instructions below. ? ? ?Follow-Up: ?At [facility], you and your health needs are our priority.  As part of our continuing mission to provide you with exceptional heart care, we have created designated Provider Care Teams.  These Care Teams include your primary Cardiologist (physician) and Advanced Practice Providers (APPs -  Physician Assistants and Nurse Practitioners) who all work together to provide you with the care you need, when you need it. ? ?Your next appointment:   ?6 month(s) ? ?The format for your next appointment:   ?In Person ? ?Provider:   ?[name], MD   ? ? ? ? ?Your cardiac CT will be scheduled at  ?[facility] ?[address] ?[place], [place] [address] ?[phone] ? ? ?Please arrive at the [facility] and [facility] Entrance  (Entrance C2) of [facility] 30 minutes prior to test start time. ?You can use the FREE valet parking offered at entrance C (encouraged to control the heart rate for the test)  ?Proceed to the [facility] Radiology Department (first floor) to check-in and test prep. ? ?All radiology patients and guests should use entrance C2 at [facility], accessed from [facility], even though the hospital's physical address listed is [address]. ? ? ? ?Please follow these instructions carefully (unless otherwise directed): ? ? ?On the Night Before the Test: ?Be sure to Drink plenty of water. ?Do not consume any caffeinated/decaffeinated beverages or chocolate 12 hours prior to your test. ?Do not take any antihistamines 12 hours prior to your test. (Cetirizine) ? ?On the Day of the Test: ?Drink plenty of water until 1 hour prior to the test. ?Do not eat any food 4 hours prior to the test. ?You may take your regular medications prior to the test.  ?Take metoprolol (Lopressor) two hours prior to test. ?HOLD Hydrochlorothiazide morning of the test. ?FEMALES- please wear underwire-free bra if available, avoid dresses & tight clothing ?     ?After the Test: ?Drink plenty of water. ?After receiving IV contrast, you may experience a mild flushed feeling. This is normal. ?On occasion, you may experience a mild rash up to 24 hours after the test. This is not dangerous. If this occurs, you can take Benadryl 25 mg and increase your fluid intake. ?If you experience trouble breathing, this can  be serious. If it is severe call 911 IMMEDIATELY. If it is mild, please call our office. ?If you take any of these medications: Glipizide/Metformin, Avandament, Glucavance, please do not take 48 hours after completing test unless otherwise instructed. ? ?We will call to schedule your test 2-4 weeks out understanding that some insurance companies will need an authorization prior to the service being performed.   ? ?For non-scheduling related questions, please contact the cardiac imaging nurse navigator should you have any questions/concerns: ?Marchia Bond, Cardiac Imaging Nurse Navigator ?Gordy Clement, Cardiac Imaging Nurse Navigator ?[facility] ?Direct Office Dial: [phone]  ? ?For scheduling needs, including cancellations and rescheduling, please call [place], [phone]. ? ? ?Important Information About Sugar ? ? ? ? ?  ?

## 2022-03-10 NOTE — Progress Notes (Signed)
?Cardiology Office Note:   ? ?Date:  03/10/2022  ? ?ID:  Krystal Allen, DOB [birth year], MRN [id number] ? ?PCP:  Ladell Pier, MD  ? ?[name] ?Cardiologist:  Lenna Sciara, MD ?Referring MD: Ladell Pier, MD  ? ?Chief Complaint/Reason for Referral: Dyspnea on exertion ? ?ASSESSMENT:   ? ?1. Dyspnea, unspecified type   ?2. Aortic atherosclerosis ([place])   ?3. Coronary artery calcification seen on CAT scan   ?4. Primary hypertension   ?5. Hyperlipidemia, unspecified hyperlipidemia type   ?6. Stage 3 chronic kidney disease, unspecified whether stage 3a or 3b CKD ([name])   ?7. Precordial pain   ? ? ?PLAN:   ? ?In order of problems listed above: ?1.  Dyspnea:   We will obtain a coronary CTA and echocardiogram to evaluate further.  If the patient has mild obstructive coronary artery disease, they will require a statin (with goal LDL < 70) and aspirin, if they have high-grade disease we will need to consider optimal medical therapy and if symptoms are refractory to medical therapy, then a cardiac catheterization with possible PCI will be pursued to alleviate symptoms.  I will start Lasix 20 mg empirically for empiric treatment of diastolic dysfunction and check a BMP in 1 week's time.  If they have high risk disease we will proceed directly to cardiac catheterization.  Follow up 6 months or earlier if needed.  If this evaluation is largely negative then the patient may require evaluation for noncardiac dyspnea.  Additionally a right heart catheterization could be pursued. ?2.  Aortic atherosclerosis: Continue aspirin, and statin with tight blood pressure control. ?3.  Coronary artery calcification on CT, continue aspirin, statin, and tight blood pressure control; coronary CTA as above. ?4.  Hypertension: Blood pressure is above goal today.  If she remains above goal the next time she is seen by medical provider she may need her blood pressure regimen adjusted. ?5.  Hyperlipidemia on even presence  of atherosclerosis of aorta and coronary calcification the patient's LDL goal is now less than 70.  Increase atorvastatin to 40 mg and check lipid panel, LP(a), LFTs in 2 months. ?6.  Chronic kidney disease: Monitor for now, this is being managed by patient's primary care provider. ? ?Dispo:  Return in about 6 months (around 09/10/2022).  ? ?  ? ?Medication Adjustments/Labs and Tests Ordered: ?Current medicines are reviewed at length with the patient today.  Concerns regarding medicines are outlined above. ? ?The following changes have been made:    ? ?Labs/tests ordered: ?Orders Placed This Encounter  ?Procedures  ? CT CORONARY MORPH W/CTA COR W/SCORE W/CA W/CM &/OR WO/CM  ? Hepatic function panel  ? Lipid panel  ? Lipoprotein A (LPA)  ? Basic metabolic panel  ? ECHOCARDIOGRAM COMPLETE  ? ? ?Medication Changes: ?Meds ordered this encounter  ?Medications  ? metoprolol tartrate (LOPRESSOR) 100 MG tablet  ?  Sig: Take 1 tablet (100 mg total) by mouth once for 1 dose. Take 90-120 minutes prior to scan.  ?  Dispense:  1 tablet  ?  Refill:  0  ? atorvastatin (LIPITOR) 40 MG tablet  ?  Sig: Take 1 tablet (40 mg total) by mouth daily.  ?  Dispense:  90 tablet  ?  Refill:  3  ?  Dose increase  ? furosemide (LASIX) 20 MG tablet  ?  Sig: Take 1 tablet (20 mg total) by mouth daily.  ?  Dispense:  90 tablet  ?  Refill:  3  ? ? ? ?Current medicines are reviewed at length with the patient today.  The patient does not have concerns regarding medicines. ? ? ?History of Present Illness:   ? ?FOCUSED PROBLEM LIST:   ?1.  Hypertension ?2.  Hyperlipidemia ?3.  BMI 42 ?4.  Aortic atherosclerosis and coronary calcification seen on chest CT 2023 ?5.  Prediabetes ?6.  Hypertension ?7.  Hyperlipidemia ?8.  CKD stage III with creatinine around 1.3 ?9.  COPD on supplemental oxygen ? ?The patient is a 66 y.o. female with the indicated medical history here for recommendations regarding shortness of breath.  She was seen in the emergency  department recently for chest pain which was tender to palpation.  She tells me that she has had increasing shortness of breath.  Her albuterol inhaler does not seem to help this.  She does note some wheezing at times.  She is not short of breath at rest.  She has noticed increasing peripheral edema as well as some orthopnea.  She denies any paroxysmal nocturnal dyspnea.  She denies any overt chest pain.  She has had no presyncope, syncope, or palpitations.  She has had no signs or symptoms of stroke. ? ?She does have a long history of smoking and is on chronic supplemental oxygen.  Her last PFTs in our system 10/2017 and her FEV1 was 2.51 with a r moderately severe fusion defect. ? ?   ?  ? ? ? ?Current Medications: ?Current Meds  ?Medication Sig  ? acetaminophen (TYLENOL 8 HOUR) 650 MG CR tablet Take 1 tablet (650 mg total) by mouth 2 (two) times daily as needed for pain.  ? albuterol (PROAIR HFA) 108 (90 Base) MCG/ACT inhaler Inhale 2 puffs into the lungs every 4 (four) hours as needed for wheezing or shortness of breath.  ? albuterol (PROVENTIL) (2.5 MG/3ML) 0.083% nebulizer solution TAKE 3 MLS BY NEBULIZATION EVERY 6 (SIX) HOURS AS NEEDED FOR WHEEZING OR SHORTNESS OF BREATH.  ? aspirin EC 81 MG tablet Take 1 tablet (81 mg total) by mouth daily.  ? atorvastatin (LIPITOR) 40 MG tablet Take 1 tablet (40 mg total) by mouth daily.  ? cetirizine (ZYRTEC) 10 MG tablet Take 1 tablet (10 mg total) by mouth daily.  ? Elastic Bandages & Supports (MEDICAL COMPRESSION STOCKINGS) MISC R60.0  ? fluticasone furoate-vilanterol (BREO ELLIPTA) 200-25 MCG/ACT AEPB Inhale 1 puff into the lungs daily.  ? furosemide (LASIX) 20 MG tablet Take 1 tablet (20 mg total) by mouth daily.  ? hydrALAZINE (APRESOLINE) 10 MG tablet Take 1 tablet (10 mg total) by mouth 2 (two) times daily.  ? hydrochlorothiazide (HYDRODIURIL) 25 MG tablet Take 1 tablet (25 mg total) by mouth daily.  ? metoprolol tartrate (LOPRESSOR) 100 MG tablet Take 1 tablet  (100 mg total) by mouth once for 1 dose. Take 90-120 minutes prior to scan.  ? Olopatadine HCl 0.2 % SOLN Apply 1 drop to eye daily.  ? omeprazole (PRILOSEC) 20 MG capsule Take 1 capsule (20 mg total) by mouth daily.  ? Respiratory Therapy Supplies (FLUTTER) DEVI Use as directed  ? Tiotropium Bromide Monohydrate (SPIRIVA RESPIMAT) 2.5 MCG/ACT AERS Inhale 2 puffs into the lungs daily.  ? [DISCONTINUED] atorvastatin (LIPITOR) 20 MG tablet TAKE 1 TABLET (20 MG TOTAL) BY MOUTH DAILY.  ?  ? ?Allergies:    ?Sulfa antibiotics, Chantix [varenicline tartrate], Fish allergy, Hygroton [chlorthalidone], Penicillins, and Latex  ? ?Social History:   ?Social History  ? ?Tobacco Use  ? Smoking status: Former  ?  Packs/day: 0.50  ?  Years: 40.00  ?  Pack years: 20.00  ?  Types: Cigarettes  ?  Quit date: 07/2021  ?  Years since quitting: 0.6  ? Smokeless tobacco: Never  ? Tobacco comments:  ?  Quit [id number]  ?Vaping Use  ? Vaping Use: Every day  ?Substance Use Topics  ? Alcohol use: Yes  ?  Comment: occasional  ? Drug use: No  ?  ? ?Family Hx: ?Family History  ?Problem Relation Age of Onset  ? Cancer Father   ?     prostate  ? Hypertension Mother   ? Diabetes Mother   ? Hypertension Sister   ? Heart disease Maternal Uncle   ? Stroke Maternal Grandmother   ? Hypertension Maternal Grandmother   ? Hypertension Sister   ? Colon cancer Neg Hx   ? Stomach cancer Neg Hx   ? Rectal cancer Neg Hx   ? Colon polyps Neg Hx   ?  ? ?Review of Systems:   ?Please see the history of present illness.    ?All other systems reviewed and are negative. ?  ? ? ?EKGs/Labs/Other Test Reviewed:   ? ?EKG:  EKG performed April 2023 that I personally reviewed demonstrates sinus rhythm with nonspecific ST-T wave changes ? ?Prior CV studies: ? ?TTE 2019 with ejection fraction 60 to 65% with mild left ventricular hypertrophy ? ?Nuclear stress test 2012 low risk ? ?Other studies Reviewed: ?Review of the additional studies/records demonstrates: Chest CT 2023 with  aortic atherosclerosis and coronary artery calcification ? ?Recent Labs: ?08/18/2021: Magnesium 2.2 ?01/11/2022: BNP 10.3 ?02/01/2022: ALT 16; Hemoglobin 15.8; Platelets 238 ?03/08/2022: BUN 17; Creatinine, Ser 1.38; [place]

## 2022-03-11 ENCOUNTER — Telehealth: Payer: Self-pay

## 2022-03-11 ENCOUNTER — Ambulatory Visit: Payer: Medicare Other | Admitting: Internal Medicine

## 2022-03-11 NOTE — Telephone Encounter (Signed)
Per PA denial: Drugs, when used for anorexia, weight loss or weight gain, are excluded from coverage under ?Medicare rules. Please refer to your Evidence of Coverage (EOC) document that details your ?Medicare prescription drug coverage. ? ?**WEGOVY PA DENIED. NOT COVERED UNDER PATIENT'S MEDICARE PLAN. ?

## 2022-03-11 NOTE — Telephone Encounter (Addendum)
[facility] / Medicare Pharmacy Claims # [phone] advised patient to call PCP and request PCP to resubmit PA for Semaglutide-Weight Management 0.25 MG/0.5ML SOAJ . Caller would like a follow up call.  ? ? ?

## 2022-03-14 ENCOUNTER — Telehealth: Payer: Self-pay

## 2022-03-14 ENCOUNTER — Other Ambulatory Visit: Payer: Self-pay

## 2022-03-14 NOTE — Telephone Encounter (Signed)
Per patient's req I have contacted ins. PA denied-no option to resubmit a PA or Appeal because weight loss drugs are excluded on pt's specific ins coverage. Pt is aware.  Denial # [id number] ?

## 2022-03-17 ENCOUNTER — Other Ambulatory Visit: Payer: Self-pay | Admitting: Internal Medicine

## 2022-03-17 ENCOUNTER — Other Ambulatory Visit (HOSPITAL_COMMUNITY): Payer: Self-pay

## 2022-03-17 ENCOUNTER — Other Ambulatory Visit: Payer: Medicare Other

## 2022-03-17 ENCOUNTER — Other Ambulatory Visit: Payer: Self-pay | Admitting: *Deleted

## 2022-03-17 DIAGNOSIS — I10 Essential (primary) hypertension: Secondary | ICD-10-CM

## 2022-03-17 DIAGNOSIS — E785 Hyperlipidemia, unspecified: Secondary | ICD-10-CM

## 2022-03-17 DIAGNOSIS — N183 Chronic kidney disease, stage 3 unspecified: Secondary | ICD-10-CM | POA: Diagnosis not present

## 2022-03-17 LAB — BASIC METABOLIC PANEL
BUN/Creatinine Ratio: 17 (ref 12–28)
BUN: 30 mg/dL — ABNORMAL HIGH (ref 8–27)
CO2: 25 mmol/L (ref 20–29)
Calcium: 9.2 mg/dL (ref 8.7–10.3)
Chloride: 101 mmol/L (ref 96–106)
Creatinine, Ser: 1.72 mg/dL — ABNORMAL HIGH (ref 0.57–1.00)
Glucose: 103 mg/dL — ABNORMAL HIGH (ref 70–99)
Potassium: 4 mmol/L (ref 3.5–5.2)
Sodium: 138 mmol/L (ref 134–144)
eGFR: 32 mL/min/{1.73_m2} — ABNORMAL LOW (ref >59)

## 2022-03-17 NOTE — Telephone Encounter (Signed)
Pt called and stated that medication for weight management was denied. She states that medication Ozempic will be covered by insurance. Pt would like a call back from the office. Please advise

## 2022-03-17 NOTE — Telephone Encounter (Signed)
Requested medication (s) are due for refill today: For review  Requested medication (s) are on the active medication list: yes    Last refill: 03/08/22  72m  2refills  Future visit scheduled No  Notes to clinic: Pharmacy comment: Possibly consider Ozempic, she states she pre diabetic, perhaps with high cholesterol her plan may cover, but it seems wegovy is completely excluded.  Please review. Thank you  Requested Prescriptions  Pending Prescriptions Disp Refills   Semaglutide-Weight Management 0.25 MG/0.5ML SOAJ 2 mL 2    Sig: Inject 0.25 mg into the skin once a week.     Endocrinology:  Diabetes - GLP-1 Receptor Agonists - semaglutide Failed - 03/17/2022  1:17 PM      Failed - Cr in normal range and within 360 days    Creat  Date Value Ref Range Status  01/15/2016 1.22 (H) 0.50 - 0.99 mg/dL Final   Creatinine, Ser  Date Value Ref Range Status  03/08/2022 1.38 (H) 0.57 - 1.00 mg/dL Final         Passed - HBA1C in normal range and within 180 days    HbA1c, POC (prediabetic range)  Date Value Ref Range Status  03/08/2022 6.2 5.7 - 6.4 % Final         Passed - Valid encounter within last 6 months    Recent Outpatient Visits           1 week ago Encounter for medication review   [facility] RPH-CPP   1 week ago Essential hypertension   [facility] B, [place]   3 weeks ago Acute pharyngitis, unspecified etiology   [facility]   1 month ago Allergic reaction, sequela   [facility]   1 month ago Allergic reaction, sequela   [facility]       Future Appointments             In 4 weeks [facility] Seeds MD [facility]   In 1 month JLadell Pier MD [facility]  In 6 months TAli Lowe AArlie Solomons MD [facility]

## 2022-03-17 NOTE — Telephone Encounter (Signed)
Krystal Allen is not covered. Possibly consider Ozempic, she states she pre diabetic, perhaps with high cholesterol her plan may cover, but it seems wegovy is completely excluded

## 2022-03-18 ENCOUNTER — Other Ambulatory Visit (HOSPITAL_COMMUNITY): Payer: Self-pay

## 2022-03-19 ENCOUNTER — Other Ambulatory Visit (HOSPITAL_COMMUNITY): Payer: Self-pay

## 2022-03-21 ENCOUNTER — Telehealth: Payer: Self-pay | Admitting: *Deleted

## 2022-03-21 ENCOUNTER — Ambulatory Visit: Payer: Self-pay | Admitting: *Deleted

## 2022-03-21 NOTE — Telephone Encounter (Signed)
Reason for Disposition  [1] Caller has URGENT medicine question about med that PCP or specialist prescribed AND [2] triager unable to answer question  Answer Assessment - Initial Assessment Questions 1. NAME of MEDICATION: "What medicine are you calling about?"     Ozempic 2. QUESTION: "What is your question?" (e.g., double dose of medicine, side effect)     The [facility] is telling me I qualify for the Ozempic.   Dr. Karle Plumber on her note from 03/17/2022 due to her being prediabetic does not qualify for the [place].    Pt has not been told anything.  She is upset that she has been calling since last week and no one has returned her call regarding her weight loss medication. I let her know I would try to get her in contact with someone who can help her with this.    3. PRESCRIBING HCP: "Who prescribed it?" Reason: if prescribed by specialist, call should be referred to that group.     Karle Plumber 4. SYMPTOMS: "Do you have any symptoms?"     I've not been on any weight loss medicine because no one will return my calls and let me know what is going on. 5. SEVERITY: If symptoms are present, ask "Are they mild, moderate or severe?"     N/A 6. PREGNANCY:  "Is there any chance that you are pregnant?" "When was your last menstrual period?"     N/A  Protocols used: Medication Question Call-A-AH

## 2022-03-21 NOTE — Telephone Encounter (Addendum)
  Chief Complaint: No one from [facility] is returning her calls since last week regarding her weight loss medication, Ozempic.  See notes.   Symptoms:  Frequency:  Pertinent Negatives: Patient denies  Disposition: '[]'$ ED /'[]'$ Urgent Care (no appt availability in office) / '[]'$ Appointment(In office/virtual)/ '[]'$  [place] [place] [facility]/ '[]'$ Home Care/ '[]'$ Refused Recommended Disposition /'[]'$ [place] [facility]/ '[x]'$  Follow-up with PCP Additional Notes: I'm sending a message to Krystal Allen, flow coordinator regarding this.     Krystal Allen is going going to have the clinica team reach out to pt.    I called pt back and let her know I had contacted the flow coordinator and that she should be getting a call back regarding the medication issue.   She thanked me for helping her with this.

## 2022-03-21 NOTE — Telephone Encounter (Signed)
Unable to attach this message to previous message r/t weight loss medication.   Intradermal weight loss medication was not covered under patient insurance.  Was approved for oral weight loss medication.   Please advise.

## 2022-03-23 ENCOUNTER — Other Ambulatory Visit (HOSPITAL_COMMUNITY): Payer: Self-pay

## 2022-03-23 DIAGNOSIS — R0602 Shortness of breath: Secondary | ICD-10-CM | POA: Diagnosis not present

## 2022-03-24 NOTE — Telephone Encounter (Signed)
Forwarding to PCP to let her know that pt's plan will not cover GLP1 RA for weight loss. Pa was denied as injectables are not included in this patient's plan. PO medications for weight loss are covered. Will route this message to PCP to see if a PO weight loss medication is appropriate for this patient.

## 2022-03-24 NOTE — Telephone Encounter (Signed)
Pt is calling back to follow up. Pt is requesting a call back.      Please advise.

## 2022-03-25 NOTE — Telephone Encounter (Addendum)
Scheduled apt with patient for 5/30 at 0810

## 2022-03-29 ENCOUNTER — Telehealth: Payer: Self-pay | Admitting: Internal Medicine

## 2022-03-29 ENCOUNTER — Telehealth (HOSPITAL_BASED_OUTPATIENT_CLINIC_OR_DEPARTMENT_OTHER): Payer: Medicare Other | Admitting: Internal Medicine

## 2022-03-29 DIAGNOSIS — I10 Essential (primary) hypertension: Secondary | ICD-10-CM

## 2022-03-29 NOTE — Telephone Encounter (Signed)
Spoke with patient of Dr. Ali Lowe - direct call from operator. Operator is asking when patient needs to take a certain med before an appointment.   Reviewed chart -- patient has CT on 6/1 and echo on 6/2. Advised patient she is to take meto tart 2 hours before CT test on Thursday. Reviewed CT instructions with her as well.   No additional assistance needed at this time.

## 2022-03-29 NOTE — Progress Notes (Signed)
Virtual Visit via Video Note  I connected with Krystal Allen on 03/29/2022 at 8:13 a.m by a video enabled telemedicine application and verified that I am speaking with the correct person using two identifiers.  Location: Patient: home Provider: Office   I discussed the limitations of evaluation and management by telemedicine and the availability of in person appointments. The patient expressed understanding and agreed to proceed.  History of Present Illness: Pt with hx of HTN, HL, aortic atherosclerosis, COPD, ILD, GERD, CKD 2-3, preDM/obesity, tob dep, DJD c-spine, spinal stenosis, stress incont, adenomatous colon polyp.  This was a visit to discuss starting her on oral medication for weight management.  On her last visit with me 03/08/2022, I prescribed  [facility] for medical wgh management.  However, it was denied by her insurance.  Patient wants to be tried with oral medication.  I went over with her today the phentermine and Contrave. Since her last visit, she has seen the cardiologist on 03/10/2022 for dyspnea on exertion and incidental finding of aortic atherosclerosis with two-vessel CAD seen on low-dose CT scan of the chest that was done for lung cancer screening.  Coronary CT and echo ordered.  Patient states that they have been scheduled for later this week.  Blood pressure on that visit was 162/90.  She is tolerating the hydralazine that was started on last visit.  I inquired whether she is checking her blood pressure she tells me that her blood pressure has been fine but has not been checking it much.  She inquired what her blood pressure was on the last visit with me.  That blood pressure was 127/96.  I also informed her that her blood pressure reading when she saw the cardiologist was elevated.   Outpatient Encounter Medications as of 03/29/2022  Medication Sig Note   acetaminophen (TYLENOL 8 HOUR) 650 MG CR tablet Take 1 tablet (650 mg total) by mouth 2 (two) times daily as needed for  pain.    albuterol (PROAIR HFA) 108 (90 Base) MCG/ACT inhaler Inhale 2 puffs into the lungs every 4 (four) hours as needed for wheezing or shortness of breath.    albuterol (PROVENTIL) (2.5 MG/3ML) 0.083% nebulizer solution TAKE 3 MLS BY NEBULIZATION EVERY 6 (SIX) HOURS AS NEEDED FOR WHEEZING OR SHORTNESS OF BREATH.    aspirin EC 81 MG tablet Take 1 tablet (81 mg total) by mouth daily.    atorvastatin (LIPITOR) 40 MG tablet Take 1 tablet (40 mg total) by mouth daily.    cetirizine (ZYRTEC) 10 MG tablet Take 1 tablet (10 mg total) by mouth daily.    Elastic Bandages & Supports (MEDICAL COMPRESSION STOCKINGS) MISC R60.0    fluticasone furoate-vilanterol (BREO ELLIPTA) 200-25 MCG/ACT AEPB Inhale 1 puff into the lungs daily.    furosemide (LASIX) 20 MG tablet Take 1 tablet (20 mg total) by mouth daily.    hydrALAZINE (APRESOLINE) 10 MG tablet Take 1 tablet (10 mg total) by mouth 2 (two) times daily.    hydrochlorothiazide (HYDRODIURIL) 25 MG tablet Take 1 tablet (25 mg total) by mouth daily.    metoprolol tartrate (LOPRESSOR) 100 MG tablet Take 1 tablet (100 mg total) by mouth once for 1 dose. Take 90-120 minutes prior to scan.    Olopatadine HCl 0.2 % SOLN Apply 1 drop to eye daily.    omeprazole (PRILOSEC) 20 MG capsule Take 1 capsule (20 mg total) by mouth daily.    oxybutynin (DITROPAN) 5 MG tablet Take 1 tablet (5 mg total)  by mouth 2 (two) times daily. (Patient not taking: Reported on 02/07/2022) 08/17/2021: Needs a refill   Respiratory Therapy Supplies (FLUTTER) DEVI Use as directed    Semaglutide-Weight Management 0.25 MG/0.5ML SOAJ Inject 0.25 mg into the skin once a week. (Patient not taking: Reported on 03/10/2022)    sodium chloride (OCEAN) 0.65 % nasal spray Place 2 sprays into both nostrils every 4 (four) hours. (Patient not taking: Reported on 03/08/2022)    Tiotropium Bromide Monohydrate (SPIRIVA RESPIMAT) 2.5 MCG/ACT AERS Inhale 2 puffs into the lungs daily.    No facility-administered  encounter medications on file as of 03/29/2022.      Observations/Objective: Older African-American female sitting in chair in NAD. Wt Readings from Last 3 Encounters:  03/10/22 276 lb (125.2 kg)  03/08/22 275 lb (124.7 kg)  02/21/22 263 lb (119.3 kg)  BMI on last visit was 42.44  Assessment and Plan: 1. Morbid obesity ([place]) -Informed patient that the 2 oral medications can be prescribed are phentermine and Contrave. I use both is the phentermine.  However before prescribing it her blood pressure needs to be better controlled and we need to make sure there is no underlying heart disease that may be affected by use of this medication. -Went over possible side effects of the Contrave.  It is also recommended that blood pressure be under better control.  Side effects include depression. -Advised checking blood pressure at least twice a week and recording the readings.  She has a follow-up appointment with me later this month.  Advised to bring readings with her.  Alternatively I can have her follow-up with clinical pharmacist much sooner if she would like.  Patient declined that. -Also informed her that I have resubmitted the referral to the nutritionist based on diagnosis of obesity and CKD to see whether her insurance will cover.  Patient tells me she thinks she has received a letter from them.  2. Essential hypertension Encouraged her to check blood pressure as mentioned above.  We do have room to increase the hydralazine if needed.   Follow Up Instructions: Keep up coming appt.   I discussed the assessment and treatment plan with the patient. The patient was provided an opportunity to ask questions and all were answered. The patient agreed with the plan and demonstrated an understanding of the instructions.   The patient was advised to call back or seek an in-person evaluation if the symptoms worsen or if the condition fails to improve as anticipated.  I spent 11 minutes dedicated to  the care of this patient on the date of this encounter to include previsit review of my last note and cardiology note, direct face-to-face time with patient discussing diagnoses and management.  This note has been created with Surveyor, quantity. Any transcriptional errors are unintentional.  Karle Plumber, MD

## 2022-03-30 ENCOUNTER — Telehealth (HOSPITAL_COMMUNITY): Payer: Self-pay | Admitting: Emergency Medicine

## 2022-03-30 NOTE — Telephone Encounter (Signed)
Reaching out to patient to offer assistance regarding upcoming cardiac imaging study; pt verbalizes understanding of appt date/time, parking situation and where to check in, pre-test NPO status and medications ordered, and verified current allergies; name and call back number provided for further questions should they arise Marchia Bond RN Navigator Cardiac Imaging [facility] [phone] office [phone] cell  Denies iv issues  Arrival 1030 '100mg'$  metoprolol tartrate

## 2022-03-31 ENCOUNTER — Ambulatory Visit (HOSPITAL_COMMUNITY)
Admission: RE | Admit: 2022-03-31 | Discharge: 2022-03-31 | Disposition: A | Discharge status: Home / Self Care | Payer: Medicare Other | Source: Ambulatory Visit | Attending: Internal Medicine | Admitting: Internal Medicine

## 2022-03-31 ENCOUNTER — Ambulatory Visit (HOSPITAL_BASED_OUTPATIENT_CLINIC_OR_DEPARTMENT_OTHER)
Admission: RE | Admit: 2022-03-31 | Discharge: 2022-03-31 | Disposition: A | Discharge status: Home / Self Care | Payer: Medicare Other | Source: Ambulatory Visit | Attending: Cardiovascular Disease | Admitting: Cardiovascular Disease

## 2022-03-31 ENCOUNTER — Other Ambulatory Visit: Payer: Self-pay | Admitting: Cardiovascular Disease

## 2022-03-31 ENCOUNTER — Encounter (HOSPITAL_COMMUNITY): Payer: Self-pay

## 2022-03-31 ENCOUNTER — Ambulatory Visit (HOSPITAL_COMMUNITY)
Admission: RE | Admit: 2022-03-31 | Discharge: 2022-03-31 | Disposition: A | Discharge status: Home / Self Care | Payer: Medicare Other | Source: Ambulatory Visit | Attending: Cardiovascular Disease | Admitting: Cardiovascular Disease

## 2022-03-31 DIAGNOSIS — R931 Abnormal findings on diagnostic imaging of heart and coronary circulation: Secondary | ICD-10-CM | POA: Insufficient documentation

## 2022-03-31 DIAGNOSIS — I2510 Atherosclerotic heart disease of native coronary artery without angina pectoris: Secondary | ICD-10-CM | POA: Diagnosis not present

## 2022-03-31 DIAGNOSIS — R0600 Dyspnea, unspecified: Secondary | ICD-10-CM | POA: Diagnosis not present

## 2022-03-31 DIAGNOSIS — R072 Precordial pain: Secondary | ICD-10-CM | POA: Insufficient documentation

## 2022-03-31 MED ORDER — NITROGLYCERIN 0.4 MG SL SUBL
0.8000 mg | SUBLINGUAL_TABLET | Freq: Once | SUBLINGUAL | Status: AC
  Administered 2022-03-31: 0.8 mg via SUBLINGUAL

## 2022-03-31 MED ORDER — NITROGLYCERIN 0.4 MG SL SUBL
SUBLINGUAL_TABLET | SUBLINGUAL | Status: DC
Start: 2022-03-31 — End: 2022-03-31
  Filled 2022-03-31: qty 2

## 2022-03-31 MED ORDER — IOHEXOL 350 MG/ML SOLN
100.0000 mL | Freq: Once | INTRAVENOUS | Status: AC | PRN
  Administered 2022-03-31: 100 mL via INTRAVENOUS

## 2022-03-31 NOTE — Progress Notes (Signed)
CT FFR ordered.   Krystal Bells T. Audie Box, MD, [place]  [address] [place], [place] [address] [phone]  2:35 PM

## 2022-04-01 ENCOUNTER — Ambulatory Visit (HOSPITAL_COMMUNITY): Payer: Medicare Other | Attending: Cardiology

## 2022-04-01 ENCOUNTER — Other Ambulatory Visit (HOSPITAL_COMMUNITY): Payer: Self-pay

## 2022-04-01 DIAGNOSIS — R0600 Dyspnea, unspecified: Secondary | ICD-10-CM | POA: Insufficient documentation

## 2022-04-01 DIAGNOSIS — R072 Precordial pain: Secondary | ICD-10-CM | POA: Insufficient documentation

## 2022-04-01 LAB — ECHOCARDIOGRAM COMPLETE
Area-P 1/2: 2.56 cm2
Calc EF: 69 %
S' Lateral: 2.8 cm
Single Plane A2C EF: 67.6 %
Single Plane A4C EF: 68.7 %

## 2022-04-04 ENCOUNTER — Telehealth: Payer: Medicare Other | Admitting: Physician Assistant

## 2022-04-04 ENCOUNTER — Ambulatory Visit: Payer: Self-pay | Admitting: *Deleted

## 2022-04-04 ENCOUNTER — Other Ambulatory Visit: Payer: Self-pay

## 2022-04-04 ENCOUNTER — Other Ambulatory Visit (HOSPITAL_COMMUNITY): Payer: Self-pay

## 2022-04-04 DIAGNOSIS — U071 COVID-19: Secondary | ICD-10-CM | POA: Diagnosis not present

## 2022-04-04 MED ORDER — MOLNUPIRAVIR EUA 200MG CAPSULE
4.0000 | ORAL_CAPSULE | Freq: Two times a day (BID) | ORAL | 0 refills | Status: AC
  Filled 2022-04-04 (×2): qty 40, 5d supply, fill #0

## 2022-04-04 MED ORDER — ONDANSETRON HCL 4 MG PO TABS
4.0000 mg | ORAL_TABLET | Freq: Three times a day (TID) | ORAL | 0 refills | Status: DC | PRN
  Filled 2022-04-04: qty 20, 7d supply, fill #0

## 2022-04-04 NOTE — Telephone Encounter (Signed)
Message from Rayann Heman sent at 04/04/2022  8:31 AM EDT  Summary: Positive covid   Pt called and stated that she tested positive for covid and would like to know if something can be called in. Symptoms started yesterday. Headache/runny nose only          Reason for Disposition  [1] HIGH RISK for severe COVID complications (e.g., weak immune system, age > 34 years, obesity with BMI > 25, pregnant, chronic lung disease or other chronic medical condition) AND [2] COVID symptoms (e.g., cough, fever)  (Exceptions: Already seen by PCP and no new or worsening symptoms.)  Answer Assessment - Initial Assessment Questions 1. COVID-19 DIAGNOSIS: "Who made your COVID-19 diagnosis?" "Was it confirmed by a positive lab test or self-test?" If not diagnosed by a doctor (or NP/PA), ask "Are there lots of cases (community spread) where you live?" Note: See public health department website, if unsure.     Covid positive tested this morning  Having headache and runny nose. 2. COVID-19 EXPOSURE: "Was there any known exposure to COVID before the symptoms began?" CDC Definition of close contact: within 6 feet (2 meters) for a total of 15 minutes or more over a 24-hour period.      Yes 3. ONSET: "When did the COVID-19 symptoms start?"      'Sunday 4. WORST SYMPTOM: "What is your worst symptom?" (e.g., cough, fever, shortness of breath, muscle aches)     Headache  I\'m taking Tylenol 5. COUGH: "Do you have a cough?" If Yes, ask: "How bad is the cough?"       I want to see if I can start an antiviral. No coughing  6. FEVER: "Do you have a fever?" If Yes, ask: "What is your temperature, how was it measured, and when did it start?"     No I don\'t think so 7. RESPIRATORY STATUS: "Describe your breathing?" (e.g., shortness of breath, wheezing, unable to speak)      No shortness of breath or chest tightness 8. BETTER-SAME-WORSE: "Are you getting better, staying the same or getting worse compared to yesterday?"  If  getting worse, ask, "In what way?"     Same 9. HIGH RISK DISEASE: "Do you have any chronic medical problems?" (e.g., asthma, heart or lung disease, weak immune system, obesity, etc.)     Yes several 10. VACCINE: "Have you had the COVID-19 vaccine?" If Yes, ask: "Which one, how many shots, when did you get it?"        11. BOOSTER: "Have you received your COVID-19 booster?" If Yes, ask: "Which one and when did you get it?"        12. PREGNANCY: "Is there any chance you are pregnant?" "When was your last menstrual period?"        13'$ . OTHER SYMPTOMS: "Do you have any other symptoms?"  (e.g., chills, fatigue, headache, loss of smell or taste, muscle pain, sore throat)       Just my head congestion and a bad headache 14. O2 SATURATION MONITOR:  "Do you use an oxygen saturation monitor (pulse oximeter) at home?" If Yes, ask "What is your reading (oxygen level) today?" "What is your usual oxygen saturation reading?" (e.g., 95%)       N/A  Protocols used: Coronavirus (COVID-19) Diagnosed or Suspected-A-AH

## 2022-04-04 NOTE — Telephone Encounter (Signed)
  Chief Complaint: Tested positive for Covid this morning.    Symptoms: Runny nose and a bad headache that started yesterday Frequency: Since yesterday Pertinent Negatives: Patient denies fever Disposition: '[]'$ ED /'[]'$ Urgent Care (no appt availability in office) / '[]'$ Appointment(In office/virtual)/ '[x]'$  [place] [facility]/ '[]'$ Home Care/ '[]'$ Refused Recommended Disposition /'[]'$ [place] Mobile [facility]/ '[]'$  Follow-up with PCP Additional Notes: Scheduled a virtual visit with [place] for today at 9:45 AM

## 2022-04-04 NOTE — Progress Notes (Signed)
Virtual Visit Consent   Krystal Allen, you are scheduled for a virtual visit with a [facility] provider today. Just as with appointments in the office, your consent must be obtained to participate. Your consent will be active for this visit and any virtual visit you may have with one of our providers in the next 365 days. If you have a MyChart account, a copy of this consent can be sent to you electronically.  As this is a virtual visit, video technology does not allow for your provider to perform a traditional examination. This may limit your provider's ability to fully assess your condition. If your provider identifies any concerns that need to be evaluated in person or the need to arrange testing (such as labs, EKG, etc.), we will make arrangements to do so. Although advances in technology are sophisticated, we cannot ensure that it will always work on either your end or our end. If the connection with a video visit is poor, the visit may have to be switched to a telephone visit. With either a video or telephone visit, we are not always able to ensure that we have a secure connection.  By engaging in this virtual visit, you consent to the provision of healthcare and authorize for your insurance to be billed (if applicable) for the services provided during this visit. Depending on your insurance coverage, you may receive a charge related to this service.  I need to obtain your verbal consent now. Are you willing to proceed with your visit today? ABY GESSEL has provided verbal consent on 04/04/2022 for a virtual visit (video or telephone). Mar Daring, PA-C  Date: 04/04/2022 9:50 AM  Virtual Visit via Video Note   I, Mar Daring, connected with  Krystal Allen  ([id number], September 06, 1956) on 04/04/22 at  9:45 AM EDT by a video-enabled telemedicine application and verified that I am speaking with the correct person using two identifiers.  Location: Patient: Virtual Visit Location  Patient: Mobile Provider: Virtual Visit Location Provider: Home Office   I discussed the limitations of evaluation and management by telemedicine and the availability of in person appointments. The patient expressed understanding and agreed to proceed.    History of Present Illness: Krystal Allen is a 66 y.o. who identifies as a female who was assigned female at birth, and is being seen today for Covid 3.  HPI: URI  This is a new problem. The current episode started yesterday (Sunday symptoms started, tested positive for Covid 19 today). The problem has been gradually worsening. There has been no fever. Associated symptoms include congestion, headaches, nausea and rhinorrhea. Pertinent negatives include no chest pain, coughing, ear pain, plugged ear sensation, sinus pain, sore throat or vomiting. She has tried acetaminophen for the symptoms. The treatment provided no relief.     Problems:  Patient Active Problem List   Diagnosis Date Noted   Morbid obesity ([place]) 03/09/2022   Seasonal allergies 02/07/2022   Mastitis, right, acute 12/23/2021   Influenza vaccine needed 09/14/2020   Tobacco dependence 05/12/2020   Aortic atherosclerosis ([place]) 05/12/2020   Adenomatous polyp of colon 05/12/2020   Langerhan's cell histiocytosis ([place]) 01/26/2018   Cigarette smoker 01/26/2018   Osteoarthritis of cervical spine 12/22/2017   Prediabetes 12/22/2017   Pain of left calf 04/06/2017   Centrilobular emphysema ([place]) 02/23/2016   Obesity (BMI 30-39.9) 01/15/2016   Bilateral shoulder pain 01/15/2016   Esophageal reflux 01/15/2016   Tobacco abuse 05/13/2015   Lung nodule  08/14/2014   Impaired fasting glucose 08/14/2014   Hyperlipidemia 08/14/2014   Personal history of colonic adenomas 06/25/2013   HTN (hypertension) 05/20/2013    Allergies:  Allergies  Allergen Reactions   Sulfa Antibiotics Anaphylaxis and Hives   Chantix [Varenicline Tartrate]     Bad dreams   Fish Allergy Other (See  Comments)    Severe stomach cramps    Hygroton [Chlorthalidone]    Penicillins Other (See Comments)    Has patient had a PCN reaction causing immediate rash, facial/tongue/throat swelling, SOB or lightheadedness with hypotension: No Has patient had a PCN reaction causing severe rash involving mucus membranes or skin necrosis: No Has patient had a PCN reaction that required hospitalization No Has patient had a PCN reaction occurring within the last 10 years: No If all of the above answers are "NO", then may proceed with Cephalosporin use.  Welts    Latex Rash and Other (See Comments)    Dry skin   Medications:  Current Outpatient Medications:    molnupiravir EUA (LAGEVRIO) 200 mg CAPS capsule, Take 4 capsules (800 mg total) by mouth 2 (two) times daily for 5 days., Disp: 40 capsule, Rfl: 0   ondansetron (ZOFRAN) 4 MG tablet, Take 1 tablet (4 mg total) by mouth every 8 (eight) hours as needed for nausea or vomiting., Disp: 20 tablet, Rfl: 0   acetaminophen (TYLENOL 8 HOUR) 650 MG CR tablet, Take 1 tablet (650 mg total) by mouth 2 (two) times daily as needed for pain., Disp: 60 tablet, Rfl: 5   albuterol (PROAIR HFA) 108 (90 Base) MCG/ACT inhaler, Inhale 2 puffs into the lungs every 4 (four) hours as needed for wheezing or shortness of breath., Disp: 8.5 g, Rfl: 2   albuterol (PROVENTIL) (2.5 MG/3ML) 0.083% nebulizer solution, TAKE 3 MLS BY NEBULIZATION EVERY 6 (SIX) HOURS AS NEEDED FOR WHEEZING OR SHORTNESS OF BREATH., Disp: 90 mL, Rfl: 1   aspirin EC 81 MG tablet, Take 1 tablet (81 mg total) by mouth daily., Disp: 100 tablet, Rfl: 1   atorvastatin (LIPITOR) 40 MG tablet, Take 1 tablet (40 mg total) by mouth daily., Disp: 90 tablet, Rfl: 3   cetirizine (ZYRTEC) 10 MG tablet, Take 1 tablet (10 mg total) by mouth daily., Disp: 30 tablet, Rfl: 11   Elastic Bandages & Supports (MEDICAL COMPRESSION STOCKINGS) MISC, R60.0, Disp: 1 each, Rfl: 0   fluticasone furoate-vilanterol (BREO ELLIPTA) 200-25  MCG/ACT AEPB, Inhale 1 puff into the lungs daily., Disp: 60 each, Rfl: 5   furosemide (LASIX) 20 MG tablet, Take 1 tablet (20 mg total) by mouth daily., Disp: 90 tablet, Rfl: 3   hydrALAZINE (APRESOLINE) 10 MG tablet, Take 1 tablet (10 mg total) by mouth 2 (two) times daily., Disp: 60 tablet, Rfl: 5   hydrochlorothiazide (HYDRODIURIL) 25 MG tablet, Take 1 tablet (25 mg total) by mouth daily., Disp: 30 tablet, Rfl: 1   metoprolol tartrate (LOPRESSOR) 100 MG tablet, Take 1 tablet (100 mg total) by mouth once for 1 dose. Take 90-120 minutes prior to scan., Disp: 1 tablet, Rfl: 0   Olopatadine HCl 0.2 % SOLN, Apply 1 drop to eye daily., Disp: 2.5 mL, Rfl: 1   omeprazole (PRILOSEC) 20 MG capsule, Take 1 capsule (20 mg total) by mouth daily., Disp: 30 capsule, Rfl: 3   oxybutynin (DITROPAN) 5 MG tablet, Take 1 tablet (5 mg total) by mouth 2 (two) times daily. (Patient not taking: Reported on 02/07/2022), Disp: 60 tablet, Rfl: 3   Respiratory Therapy Supplies (  FLUTTER) DEVI, Use as directed, Disp: 1 each, Rfl: 0   Semaglutide-Weight Management 0.25 MG/0.5ML SOAJ, Inject 0.25 mg into the skin once a week. (Patient not taking: Reported on 03/10/2022), Disp: 2 mL, Rfl: 2   sodium chloride (OCEAN) 0.65 % nasal spray, Place 2 sprays into both nostrils every 4 (four) hours. (Patient not taking: Reported on 03/08/2022), Disp: 44 mL, Rfl: 0   Tiotropium Bromide Monohydrate (SPIRIVA RESPIMAT) 2.5 MCG/ACT AERS, Inhale 2 puffs into the lungs daily., Disp: 4 g, Rfl: 5  Observations/Objective: Patient is well-developed, well-nourished in no acute distress.  Resting comfortably Head is normocephalic, atraumatic.  No labored breathing.  Speech is clear and coherent with logical content.  Patient is alert and oriented at baseline.    Assessment and Plan: 1. COVID-19 - molnupiravir EUA (LAGEVRIO) 200 mg CAPS capsule; Take 4 capsules (800 mg total) by mouth 2 (two) times daily for 5 days.  Dispense: 40 capsule; Refill:  0 - ondansetron (ZOFRAN) 4 MG tablet; Take 1 tablet (4 mg total) by mouth every 8 (eight) hours as needed for nausea or vomiting.  Dispense: 20 tablet; Refill: 0  - Continue OTC symptomatic management of choice - Will send OTC vitamins and supplement information through AVS - Molnupiravir and Zofran prescribed - Patient enrolled in MyChart symptom monitoring - Push fluids - Rest as needed - Discussed return precautions and when to seek in-person evaluation, sent via AVS as well  Follow Up Instructions: I discussed the assessment and treatment plan with the patient. The patient was provided an opportunity to ask questions and all were answered. The patient agreed with the plan and demonstrated an understanding of the instructions.  A copy of instructions were sent to the patient via MyChart unless otherwise noted below.    The patient was advised to call back or seek an in-person evaluation if the symptoms worsen or if the condition fails to improve as anticipated.  Time:  I spent 12 minutes with the patient via telehealth technology discussing the above problems/concerns.    Mar Daring, PA-C

## 2022-04-04 NOTE — Patient Instructions (Signed)
Krystal Allen, thank you for joining Mar Daring, PA-C for today's virtual visit.  While this provider is not your primary care provider (PCP), if your PCP is located in our provider database this encounter information will be shared with them immediately following your visit.  Consent: (Patient) Krystal Allen provided verbal consent for this virtual visit at the beginning of the encounter.  Current Medications:  Current Outpatient Medications:    molnupiravir EUA (LAGEVRIO) 200 mg CAPS capsule, Take 4 capsules (800 mg total) by mouth 2 (two) times daily for 5 days., Disp: 40 capsule, Rfl: 0   ondansetron (ZOFRAN) 4 MG tablet, Take 1 tablet (4 mg total) by mouth every 8 (eight) hours as needed for nausea or vomiting., Disp: 20 tablet, Rfl: 0   acetaminophen (TYLENOL 8 HOUR) 650 MG CR tablet, Take 1 tablet (650 mg total) by mouth 2 (two) times daily as needed for pain., Disp: 60 tablet, Rfl: 5   albuterol (PROAIR HFA) 108 (90 Base) MCG/ACT inhaler, Inhale 2 puffs into the lungs every 4 (four) hours as needed for wheezing or shortness of breath., Disp: 8.5 g, Rfl: 2   albuterol (PROVENTIL) (2.5 MG/3ML) 0.083% nebulizer solution, TAKE 3 MLS BY NEBULIZATION EVERY 6 (SIX) HOURS AS NEEDED FOR WHEEZING OR SHORTNESS OF BREATH., Disp: 90 mL, Rfl: 1   aspirin EC 81 MG tablet, Take 1 tablet (81 mg total) by mouth daily., Disp: 100 tablet, Rfl: 1   atorvastatin (LIPITOR) 40 MG tablet, Take 1 tablet (40 mg total) by mouth daily., Disp: 90 tablet, Rfl: 3   cetirizine (ZYRTEC) 10 MG tablet, Take 1 tablet (10 mg total) by mouth daily., Disp: 30 tablet, Rfl: 11   Elastic Bandages & Supports (MEDICAL COMPRESSION STOCKINGS) MISC, R60.0, Disp: 1 each, Rfl: 0   fluticasone furoate-vilanterol (BREO ELLIPTA) 200-25 MCG/ACT AEPB, Inhale 1 puff into the lungs daily., Disp: 60 each, Rfl: 5   furosemide (LASIX) 20 MG tablet, Take 1 tablet (20 mg total) by mouth daily., Disp: 90 tablet, Rfl: 3   hydrALAZINE  (APRESOLINE) 10 MG tablet, Take 1 tablet (10 mg total) by mouth 2 (two) times daily., Disp: 60 tablet, Rfl: 5   hydrochlorothiazide (HYDRODIURIL) 25 MG tablet, Take 1 tablet (25 mg total) by mouth daily., Disp: 30 tablet, Rfl: 1   metoprolol tartrate (LOPRESSOR) 100 MG tablet, Take 1 tablet (100 mg total) by mouth once for 1 dose. Take 90-120 minutes prior to scan., Disp: 1 tablet, Rfl: 0   Olopatadine HCl 0.2 % SOLN, Apply 1 drop to eye daily., Disp: 2.5 mL, Rfl: 1   omeprazole (PRILOSEC) 20 MG capsule, Take 1 capsule (20 mg total) by mouth daily., Disp: 30 capsule, Rfl: 3   oxybutynin (DITROPAN) 5 MG tablet, Take 1 tablet (5 mg total) by mouth 2 (two) times daily. (Patient not taking: Reported on 02/07/2022), Disp: 60 tablet, Rfl: 3   Respiratory Therapy Supplies (FLUTTER) DEVI, Use as directed, Disp: 1 each, Rfl: 0   Semaglutide-Weight Management 0.25 MG/0.5ML SOAJ, Inject 0.25 mg into the skin once a week. (Patient not taking: Reported on 03/10/2022), Disp: 2 mL, Rfl: 2   sodium chloride (OCEAN) 0.65 % nasal spray, Place 2 sprays into both nostrils every 4 (four) hours. (Patient not taking: Reported on 03/08/2022), Disp: 44 mL, Rfl: 0   Tiotropium Bromide Monohydrate (SPIRIVA RESPIMAT) 2.5 MCG/ACT AERS, Inhale 2 puffs into the lungs daily., Disp: 4 g, Rfl: 5   Medications ordered in this encounter:  Meds ordered this  encounter  Medications   molnupiravir EUA (LAGEVRIO) 200 mg CAPS capsule    Sig: Take 4 capsules (800 mg total) by mouth 2 (two) times daily for 5 days.    Dispense:  40 capsule    Refill:  0    Order Specific Question:   Supervising Provider    Answer:   MILLER, BRIAN [[id number]]   ondansetron (ZOFRAN) 4 MG tablet    Sig: Take 1 tablet (4 mg total) by mouth every 8 (eight) hours as needed for nausea or vomiting.    Dispense:  20 tablet    Refill:  0    Order Specific Question:   Supervising Provider    Answer:   Sabra Heck, BRIAN [[id number]]     *If you need refills on other medications  prior to your next appointment, please contact your pharmacy*  Follow-Up: Call back or seek an in-person evaluation if the symptoms worsen or if the condition fails to improve as anticipated.  Other Instructions 10 Things You Can Do to Manage Your COVID-19 Symptoms at Home If you have possible or confirmed COVID-19 Stay home except to get medical care. Monitor your symptoms carefully. If your symptoms get worse, call your healthcare provider immediately. Get rest and stay hydrated. If you have a medical appointment, call the healthcare provider ahead of time and tell them that you have or may have COVID-19. For medical emergencies, call 911 and notify the dispatch personnel that you have or may have COVID-19. Cover your cough and sneezes with a tissue or use the inside of your elbow. Wash your hands often with soap and water for at least 20 seconds or clean your hands with an alcohol-based hand sanitizer that contains at least 60% alcohol. As much as possible, stay in a specific room and away from other people in your home. Also, you should use a separate bathroom, if available. If you need to be around other people in or outside of the home, wear a mask. Avoid sharing personal items with other people in your household, like dishes, towels, and bedding. Clean all surfaces that are touched often, like counters, tabletops, and doorknobs. Use household cleaning sprays or wipes according to the label instructions. [email or web address] 05/15/2020 This information is not intended to replace advice given to you by your health care provider. Make sure you discuss any questions you have with your health care provider. Document Revised: 07/09/2021 Document Reviewed: 07/09/2021 Elsevier Patient Education  2022 [employer].    If you have been instructed to have an in-person evaluation today at a local Urgent Care facility, please use the link below. It will take you to a list of all of our available  [facility], including address, phone number and hours of operation. Please do not delay care.  [facility]  If you or a family member do not have a primary care provider, use the link below to schedule a visit and establish care. When you choose a [facility] primary care physician or advanced practice provider, you gain a long-term partner in health. Find a Primary Care Provider  Learn more about [facility] in-office and virtual care options: [facility]

## 2022-04-07 ENCOUNTER — Other Ambulatory Visit: Payer: Self-pay

## 2022-04-07 ENCOUNTER — Other Ambulatory Visit (HOSPITAL_COMMUNITY): Payer: Self-pay

## 2022-04-12 ENCOUNTER — Other Ambulatory Visit (HOSPITAL_COMMUNITY): Payer: Self-pay

## 2022-04-14 ENCOUNTER — Ambulatory Visit: Payer: Medicare Other | Admitting: Pulmonary Disease

## 2022-04-15 ENCOUNTER — Encounter: Payer: Self-pay | Admitting: Pulmonary Disease

## 2022-04-15 ENCOUNTER — Other Ambulatory Visit: Payer: Self-pay

## 2022-04-15 ENCOUNTER — Ambulatory Visit (INDEPENDENT_AMBULATORY_CARE_PROVIDER_SITE_OTHER): Payer: Medicare Other | Admitting: Pulmonary Disease

## 2022-04-15 VITALS — BP 120/70 | HR 64 | Temp 97.8°F | Ht 68.0 in | Wt 265.4 lb

## 2022-04-15 DIAGNOSIS — J432 Centrilobular emphysema: Secondary | ICD-10-CM

## 2022-04-15 DIAGNOSIS — G4734 Idiopathic sleep related nonobstructive alveolar hypoventilation: Secondary | ICD-10-CM | POA: Diagnosis not present

## 2022-04-15 DIAGNOSIS — J8482 Adult pulmonary Langerhans cell histiocytosis: Secondary | ICD-10-CM

## 2022-04-15 LAB — PULMONARY FUNCTION TEST
DL/VA % pred: 70 %
DL/VA: 2.84 ml/min/mmHg/L
DLCO cor % pred: 60 %
DLCO cor: 13.64 ml/min/mmHg
DLCO unc % pred: 60 %
DLCO unc: 13.64 ml/min/mmHg
FEF 25-75 Post: 1.95 L/sec
FEF 25-75 Pre: 1.89 L/sec
FEF2575-%Change-Post: 3 %
FEF2575-%Pred-Post: 91 %
FEF2575-%Pred-Pre: 88 %
FEV1-%Change-Post: 1 %
FEV1-%Pred-Post: 89 %
FEV1-%Pred-Pre: 87 %
FEV1-Post: 2.06 L
FEV1-Pre: 2.03 L
FEV1FVC-%Change-Post: -3 %
FEV1FVC-%Pred-Pre: 100 %
FEV6-%Change-Post: 5 %
FEV6-%Pred-Post: 94 %
FEV6-%Pred-Pre: 89 %
FEV6-Post: 2.71 L
FEV6-Pre: 2.56 L
FEV6FVC-%Change-Post: 0 %
FEV6FVC-%Pred-Post: 103 %
FEV6FVC-%Pred-Pre: 103 %
FVC-%Change-Post: 5 %
FVC-%Pred-Post: 91 %
FVC-%Pred-Pre: 87 %
FVC-Post: 2.71 L
FVC-Pre: 2.58 L
Post FEV1/FVC ratio: 76 %
Post FEV6/FVC ratio: 100 %
Pre FEV1/FVC ratio: 79 %
Pre FEV6/FVC Ratio: 100 %
RV % pred: 95 %
RV: 2.21 L
TLC % pred: 90 %
TLC: 5.13 L

## 2022-04-15 MED ORDER — FLUTICASONE FUROATE-VILANTEROL 200-25 MCG/ACT IN AEPB
1.0000 | INHALATION_SPRAY | Freq: Every day | RESPIRATORY_TRACT | 5 refills | Status: DC
  Filled 2022-04-15: qty 60, 30d supply, fill #0
  Filled 2022-06-02: qty 60, 30d supply, fill #1
  Filled 2022-07-19: qty 60, 30d supply, fill #2
  Filled 2022-09-17: qty 60, 30d supply, fill #3

## 2022-04-15 NOTE — Progress Notes (Unsigned)
Subjective:   PATIENT ID: Krystal Allen GENDER: female DOB: 04/14/1956, MRN: [id number]   HPI  Chief Complaint  Patient presents with   Follow-up    PFT performed today.  Pt states she has been doing okay since last visit and denies any current complaints.    Reason for Visit: Follow-up  Ms. Krystal Allen is a 66 year old female former smoker with emphysema, chronic hypoxemic respiratory failure, Pulmonary Langerhans cell histiocytosis, hypertension, hyperlipidemia who presents follow-up.  She was hospitalized in October 2022 for COPD exacerbation secondary to influenza. She quit smoking since then and has done well. She does notice she is snacking more in place of her nicotine craving. She has shortness of breath with wheezing. Wheezing occurs at night sometimes. No chronic cough. Symptoms worsen with changes in weather and will use her rescue inhaler 1-2 a week. She is compliant with Breo 200 daily. She wears her oxygen when she feels short of breath. She checks her O2 levels which usually go down to 89%. She reports good quality of sleep >8 hours. She does snore. Her roommate does not report witnessed apnea or gasping. No morning headaches or fatigue.   04/15/22 Since our last visit she has been compliant with Breo and Spiriva and feels this is helping her. She has been wearing oxygen at night only when she thinks she needs it. Occasional shortness of breath. Sometimes coughing but this has improved. Wheezing with exertion. Does not feel like her albuterol inhaler is effective. Uses it every other with several puffs. No exacerbations in the last 8 months when she was hospitalized for the flu.  Social History: Former smoker. 1/2 ppd x 40 years [employer] x 33 years    Past Medical History:  Diagnosis Date   Allergy    COPD (chronic obstructive pulmonary disease) (HCC)    Depression    GERD (gastroesophageal reflux disease)    Hernia, abdominal    History of chicken pox     Hyperlipidemia    Hypertension    Internal hemorrhoids with Grade 3 prolapse and bleeding 06/19/2013   Osteoarthritis    Personal history of colonic adenomas 06/25/2013     Family History  Problem Relation Age of Onset   Cancer Father        prostate   Hypertension Mother    Diabetes Mother    Hypertension Sister    Heart disease Maternal Uncle    Stroke Maternal Grandmother    Hypertension Maternal Grandmother    Hypertension Sister    Colon cancer Neg Hx    Stomach cancer Neg Hx    Rectal cancer Neg Hx    Colon polyps Neg Hx      Social History   Occupational History   Occupation: PCA    Employer: [employer]  Tobacco Use   Smoking status: Former    Packs/day: 0.50    Years: 40.00    Total pack years: 20.00    Types: Cigarettes    Quit date: 07/2021    Years since quitting: 0.7   Smokeless tobacco: Never   Tobacco comments:    Quit 102022  Vaping Use   Vaping Use: Every day  Substance and Sexual Activity   Alcohol use: Yes    Comment: occasional   Drug use: No   Sexual activity: Not Currently    Allergies  Allergen Reactions   Sulfa Antibiotics Anaphylaxis and Hives   Chantix [Varenicline Tartrate]     Bad dreams  Fish Allergy Other (See Comments)    Severe stomach cramps    Hygroton [Chlorthalidone]    Penicillins Other (See Comments)    Has patient had a PCN reaction causing immediate rash, facial/tongue/throat swelling, SOB or lightheadedness with hypotension: No Has patient had a PCN reaction causing severe rash involving mucus membranes or skin necrosis: No Has patient had a PCN reaction that required hospitalization No Has patient had a PCN reaction occurring within the last 10 years: No If all of the above answers are "NO", then may proceed with Cephalosporin use.  Welts    Latex Rash and Other (See Comments)    Dry skin     Outpatient Medications Prior to Visit  Medication Sig Dispense Refill   acetaminophen (TYLENOL 8 HOUR) 650 MG CR  tablet Take 1 tablet (650 mg total) by mouth 2 (two) times daily as needed for pain. 60 tablet 5   albuterol (PROAIR HFA) 108 (90 Base) MCG/ACT inhaler Inhale 2 puffs into the lungs every 4 (four) hours as needed for wheezing or shortness of breath. 8.5 g 2   albuterol (PROVENTIL) (2.5 MG/3ML) 0.083% nebulizer solution TAKE 3 MLS BY NEBULIZATION EVERY 6 (SIX) HOURS AS NEEDED FOR WHEEZING OR SHORTNESS OF BREATH. 90 mL 1   aspirin EC 81 MG tablet Take 1 tablet (81 mg total) by mouth daily. 100 tablet 1   atorvastatin (LIPITOR) 40 MG tablet Take 1 tablet (40 mg total) by mouth daily. 90 tablet 3   cetirizine (ZYRTEC) 10 MG tablet Take 1 tablet (10 mg total) by mouth daily. 30 tablet 11   Elastic Bandages & Supports (MEDICAL COMPRESSION STOCKINGS) MISC R60.0 1 each 0   fluticasone furoate-vilanterol (BREO ELLIPTA) 200-25 MCG/ACT AEPB Inhale 1 puff into the lungs daily. 60 each 5   furosemide (LASIX) 20 MG tablet Take 1 tablet (20 mg total) by mouth daily. 90 tablet 3   hydrALAZINE (APRESOLINE) 10 MG tablet Take 1 tablet (10 mg total) by mouth 2 (two) times daily. 60 tablet 5   hydrochlorothiazide (HYDRODIURIL) 25 MG tablet Take 1 tablet (25 mg total) by mouth daily. 30 tablet 1   Olopatadine HCl 0.2 % SOLN Apply 1 drop to eye daily. 2.5 mL 1   omeprazole (PRILOSEC) 20 MG capsule Take 1 capsule (20 mg total) by mouth daily. 30 capsule 3   oxybutynin (DITROPAN) 5 MG tablet Take 1 tablet (5 mg total) by mouth 2 (two) times daily. (Patient not taking: Reported on 02/07/2022) 60 tablet 3   Respiratory Therapy Supplies (FLUTTER) DEVI Use as directed 1 each 0   sodium chloride (OCEAN) 0.65 % nasal spray Place 2 sprays into both nostrils every 4 (four) hours. (Patient not taking: Reported on 03/08/2022) 44 mL 0   Tiotropium Bromide Monohydrate (SPIRIVA RESPIMAT) 2.5 MCG/ACT AERS Inhale 2 puffs into the lungs daily. 4 g 5   metoprolol tartrate (LOPRESSOR) 100 MG tablet Take 1 tablet (100 mg total) by mouth once for  1 dose. Take 90-120 minutes prior to scan. 1 tablet 0   ondansetron (ZOFRAN) 4 MG tablet Take 1 tablet (4 mg total) by mouth every 8 (eight) hours as needed for nausea or vomiting. 20 tablet 0   Semaglutide-Weight Management 0.25 MG/0.5ML SOAJ Inject 0.25 mg into the skin once a week. (Patient not taking: Reported on 03/10/2022) 2 mL 2   No facility-administered medications prior to visit.    Review of Systems  Constitutional:  Negative for chills, diaphoresis, fever, malaise/fatigue and weight loss.  HENT:  Negative for congestion.   Respiratory:  Positive for shortness of breath and wheezing. Negative for cough, hemoptysis and sputum production.   Cardiovascular:  Negative for chest pain, palpitations and leg swelling.     Objective:   Vitals:   04/15/22 1011  BP: 120/70  Pulse: 64  Temp: 97.8 F (36.6 C)  TempSrc: Oral  SpO2: 95%  Weight: 265 lb 6.4 oz (120.4 kg)  Height: '5\' 8"'$  (1.727 m)  SpO2: 95 % (RA) O2 Device: None (Room air)  Physical Exam: General: Well-appearing, no acute distress HENT: [place], AT Eyes: EOMI, no scleral icterus Respiratory: Clear to auscultation bilaterally.  No crackles, wheezing or rales Cardiovascular: RRR, -M/R/G, no JVD Extremities:-Edema,-tenderness Neuro: AAO x4, CNII-XII grossly intact Psych: Normal mood, normal affect  Data Reviewed:  Imaging: CT 12/15/17 - Mild emphysema. Numerous pulmonary nodules and thin walled cysts bilaterally. Larged lung nodule is 6 mm in the RUL CXR 08/19/21 - Normal. No infiltrate, effusion or edema.  PFT: 01/26/21 FVC 2.99 (97%) FEV1 2.49 (103%) Ratio 79  TLC 88% DLCO 53% Interpretation: Normal spirometry and lung volumes. No significant BD response present. Moderately reduced gas exchange.  04/15/22 *** Interpretation: Normal spirometry and lung volumes. Improved gas exchange to mild  Labs: CBC    Component Value Date/Time   WBC 7.3 02/01/2022 1559   RBC 5.45 (H) 02/01/2022 1559   HGB 15.8 (H)  02/01/2022 1559   HGB 14.1 01/27/2021 1009   HCT 48.4 (H) 02/01/2022 1559   HCT 42.5 01/27/2021 1009   PLT 238 02/01/2022 1559   PLT 261 01/27/2021 1009   MCV 88.8 02/01/2022 1559   MCV 89 01/27/2021 1009   MCH 29.0 02/01/2022 1559   MCHC 32.6 02/01/2022 1559   RDW 17.0 (H) 02/01/2022 1559   RDW 15.4 01/27/2021 1009   LYMPHSABS 1.3 02/01/2022 1559   LYMPHSABS 2.2 02/10/2020 1000   MONOABS 0.3 02/01/2022 1559   EOSABS 0.0 02/01/2022 1559   EOSABS 0.2 02/10/2020 1000   BASOSABS 0.0 02/01/2022 1559   BASOSABS 0.1 02/10/2020 1000       Assessment & Plan:   Discussion: 66 year old female former smoker with emphysema, chronic hypoxemic respiratory failure, Pulmonary Langerhans cell histiocytosis, hypertension, hyperlipidemia who presents as new consult. Ambulatory O2 with no desaturations. [place] O2 91%  Emphysema --CONTINUE Breo 200-25 mcg ONE puff ONCE a day. REFILL --CONTINUE Spiriva 2.5 mcg TWO puffs ONCE a day  Nocturnal Hypoxemia --CONTINUE oxygen 2L  via [place] nightly while sleeping  Pulmonary Langerhans cell histiocytosis Moderately reduced DLCO --Improved DLCO --Next PFTs due 04/2023  Morbid obesity BMI 40.35 --We discussed healthy diet and regular aerobic exercise --Meeting with dietician  Health Maintenance Immunization History  Administered Date(s) Administered   Influenza Split 07/10/2017   Influenza,inj,Quad PF,6+ Mos 08/14/2014, 09/14/2020, 09/13/2021   PFIZER Comirnaty(Gray Top)Covid-19 Tri-Sucrose Vaccine 01/25/2021   PFIZER(Purple Top)SARS-COV-2 Vaccination 05/12/2020, 06/02/2020   PNEUMOCOCCAL CONJUGATE-20 08/24/2021   Tdap 08/14/2014   CT Lung Screen- Enrolled  No orders of the defined types were placed in this encounter.  No orders of the defined types were placed in this encounter.   No follow-ups on file.  I have spent a total time of 45-minutes on the day of the appointment reviewing prior documentation, coordinating care and discussing  medical diagnosis and plan with the patient/family. Imaging, labs and tests included in this note have been reviewed and interpreted independently by me.  Marquise Lambson Rodman Pickle, MD [facility] 04/15/2022 10:20 AM  Office  Number [phone]

## 2022-04-15 NOTE — Progress Notes (Signed)
PFT done today. 

## 2022-04-15 NOTE — Patient Instructions (Signed)
Emphysema --CONTINUE Breo 200-25 mcg ONE puff ONCE a day. REFILL --CONTINUE Spiriva 2.5 mcg TWO puffs ONCE a day  Nocturnal Hypoxemia --CONTINUE oxygen 2L  via [place] nightly while sleeping  Pulmonary Langerhans cell histiocytosis Moderately reduced DLCO --Improved DLCO --Next PFTs due 04/2023  Follow-up with me in 4 months

## 2022-04-21 ENCOUNTER — Other Ambulatory Visit (HOSPITAL_COMMUNITY): Payer: Self-pay

## 2022-04-23 DIAGNOSIS — R0602 Shortness of breath: Secondary | ICD-10-CM | POA: Diagnosis not present

## 2022-04-29 ENCOUNTER — Other Ambulatory Visit: Payer: Self-pay

## 2022-04-29 ENCOUNTER — Encounter: Payer: Self-pay | Admitting: Internal Medicine

## 2022-04-29 ENCOUNTER — Ambulatory Visit: Payer: Medicare Other | Attending: Internal Medicine | Admitting: Internal Medicine

## 2022-04-29 VITALS — BP 124/75 | HR 64 | Wt 268.8 lb

## 2022-04-29 DIAGNOSIS — N1832 Chronic kidney disease, stage 3b: Secondary | ICD-10-CM

## 2022-04-29 DIAGNOSIS — I10 Essential (primary) hypertension: Secondary | ICD-10-CM

## 2022-04-29 MED ORDER — PHENTERMINE HCL 30 MG PO CAPS
30.0000 mg | ORAL_CAPSULE | ORAL | 1 refills | Status: DC
  Filled 2022-04-29: qty 30, 30d supply, fill #0
  Filled 2022-06-02: qty 30, 30d supply, fill #1

## 2022-04-29 NOTE — Progress Notes (Signed)
Patient ID: Krystal Allen, female    DOB: 1956-07-03  MRN: [id number]  CC: f/u HTN and obesity  Subjective: Krystal Allen is a 66 y.o. female who presents for f/u HTN and obesity Her concerns today include:  Pt with hx of HTN, HL, aortic atherosclerosis, COPD, ILD, GERD, CKD 2-3, preDM/obesity, former tob dep, DJD c-spine, spinal stenosis, stress incont, adenomatous colon polyp.    Since last visit with me, patient has completed her work-up with cardiologist Dr. Audie Box.  Echo revealed good heart function of 60 to 65% with no VHD.  Coronary CT revealed mild nonobstructive blockages.  He recommended aspirin and atorvastatin.  She has started the atorvastatin and is tolerating the medication. In regards to her blood pressure, she has been checking it regularly since we discontinued Norvasc and started hydralazine instead.  She is taking and tolerating the hydralazine and hydrochlorothiazide.  No further issues with significant swelling in the feet/legs.  Reports home blood pressure has stayed between 120-130/70s.  -In regards to her weight, she is down 7 pounds since last visit with me on 03/08/2022.  She is trying to improve on her eating habits.  She has an appointment coming up with the nutritionist next month.  She is planning to start going back to the gym.  We had tried to get her approved for [facility] but her insurance denied it.  We discussed putting her on oral medication to help with weight loss once blood pressure was under good control and she had completed work-up with cardiology.  We have also been keeping a watch on her kidney function.  On last visit with me, GFR went from 30s-40s to 29 and creatinine was 1.91.  These were the readings from ER visit prior to that visit with me.  Patient reported that she was taking NSAIDs when she was having soreness of the throat and lips.  I advised her to discontinue all NSAIDs.  Most recent GFR was 32 with creatinine of 1.72.  Patient Active  Problem List   Diagnosis Date Noted   Morbid obesity ([place]) 03/09/2022   Seasonal allergies 02/07/2022   Mastitis, right, acute 12/23/2021   Influenza vaccine needed 09/14/2020   Tobacco dependence 05/12/2020   Aortic atherosclerosis ([place]) 05/12/2020   Adenomatous polyp of colon 05/12/2020   Langerhan's cell histiocytosis ([place]) 01/26/2018   Cigarette smoker 01/26/2018   Osteoarthritis of cervical spine 12/22/2017   Prediabetes 12/22/2017   Pain of left calf 04/06/2017   Centrilobular emphysema ([place]) 02/23/2016   Obesity (BMI 30-39.9) 01/15/2016   Bilateral shoulder pain 01/15/2016   Esophageal reflux 01/15/2016   Tobacco abuse 05/13/2015   Lung nodule 08/14/2014   Impaired fasting glucose 08/14/2014   Hyperlipidemia 08/14/2014   Personal history of colonic adenomas 06/25/2013   HTN (hypertension) 05/20/2013     Current Outpatient Medications on File Prior to Visit  Medication Sig Dispense Refill   acetaminophen (TYLENOL 8 HOUR) 650 MG CR tablet Take 1 tablet (650 mg total) by mouth 2 (two) times daily as needed for pain. 60 tablet 5   albuterol (PROAIR HFA) 108 (90 Base) MCG/ACT inhaler Inhale 2 puffs into the lungs every 4 (four) hours as needed for wheezing or shortness of breath. 8.5 g 2   albuterol (PROVENTIL) (2.5 MG/3ML) 0.083% nebulizer solution TAKE 3 MLS BY NEBULIZATION EVERY 6 (SIX) HOURS AS NEEDED FOR WHEEZING OR SHORTNESS OF BREATH. 90 mL 1   aspirin EC 81 MG tablet Take 1 tablet (81 mg total)  by mouth daily. 100 tablet 1   atorvastatin (LIPITOR) 40 MG tablet Take 1 tablet (40 mg total) by mouth daily. 90 tablet 3   cetirizine (ZYRTEC) 10 MG tablet Take 1 tablet (10 mg total) by mouth daily. 30 tablet 11   fluticasone furoate-vilanterol (BREO ELLIPTA) 200-25 MCG/ACT AEPB Inhale 1 puff into the lungs once daily. 60 each 5   furosemide (LASIX) 20 MG tablet Take 1 tablet (20 mg total) by mouth daily. 90 tablet 3   hydrALAZINE (APRESOLINE) 10 MG tablet Take 1 tablet (10 mg  total) by mouth 2 (two) times daily. 60 tablet 5   hydrochlorothiazide (HYDRODIURIL) 25 MG tablet Take 1 tablet (25 mg total) by mouth daily. 30 tablet 1   Olopatadine HCl 0.2 % SOLN Apply 1 drop to affected eyes daily as directed. 2.5 mL 1   omeprazole (PRILOSEC) 20 MG capsule Take 1 capsule (20 mg total) by mouth daily. 30 capsule 3   oxybutynin (DITROPAN) 5 MG tablet Take 1 tablet (5 mg total) by mouth 2 (two) times daily. 60 tablet 3   Respiratory Therapy Supplies (FLUTTER) DEVI Use as directed 1 each 0   sodium chloride (OCEAN) 0.65 % nasal spray Place 2 sprays into both nostrils every 4 (four) hours. 44 mL 0   Tiotropium Bromide Monohydrate (SPIRIVA RESPIMAT) 2.5 MCG/ACT AERS Inhale 2 puffs into the lungs daily. 4 g 5   Elastic Bandages & Supports (MEDICAL COMPRESSION STOCKINGS) MISC R60.0 (Patient not taking: Reported on 04/29/2022) 1 each 0   No current facility-administered medications on file prior to visit.    Allergies  Allergen Reactions   Sulfa Antibiotics Anaphylaxis and Hives   Chantix [Varenicline Tartrate]     Bad dreams   Fish Allergy Other (See Comments)    Severe stomach cramps    Hygroton [Chlorthalidone]    Penicillins Other (See Comments)    Has patient had a PCN reaction causing immediate rash, facial/tongue/throat swelling, SOB or lightheadedness with hypotension: No Has patient had a PCN reaction causing severe rash involving mucus membranes or skin necrosis: No Has patient had a PCN reaction that required hospitalization No Has patient had a PCN reaction occurring within the last 10 years: No If all of the above answers are "NO", then may proceed with Cephalosporin use.  Welts    Latex Rash and Other (See Comments)    Dry skin    Social History   Socioeconomic History   Marital status: Married    Spouse name: Not on file   Number of children: Not on file   Years of education: 12+   Highest education level: Not on file  Occupational History    Occupation: PCA    Employer: [employer]  Tobacco Use   Smoking status: Former    Packs/day: 0.50    Years: 40.00    Total pack years: 20.00    Types: Cigarettes    Quit date: 07/2021    Years since quitting: 0.7   Smokeless tobacco: Never   Tobacco comments:    Quit 102022  Vaping Use   Vaping Use: Every day  Substance and Sexual Activity   Alcohol use: Yes    Comment: occasional   Drug use: No   Sexual activity: Not Currently  Other Topics Concern   Not on file  Social History Narrative   Regular exercise-no Caffeine Use-   Social Determinants of Health   Financial Resource Strain: Not on file  Food Insecurity: Not on file  Transportation Needs: Not on file  Physical Activity: Not on file  Stress: Not on file  Social Connections: Not on file  Intimate Partner Violence: Not on file    Family History  Problem Relation Age of Onset   Cancer Father        prostate   Hypertension Mother    Diabetes Mother    Hypertension Sister    Heart disease Maternal Uncle    Stroke Maternal Grandmother    Hypertension Maternal Grandmother    Hypertension Sister    Colon cancer Neg Hx    Stomach cancer Neg Hx    Rectal cancer Neg Hx    Colon polyps Neg Hx     Past Surgical History:  Procedure Laterality Date   ABDOMINAL HYSTERECTOMY  36yr ago   due to heavy bleeding and fibroids    COLONOSCOPY     HEMORRHOID BANDING  2014   HEMORRHOID SURGERY  [id number]   UMBILICAL HERNIA REPAIR  06/2015    ROS: Review of Systems Negative except as stated above  PHYSICAL EXAM: BP 124/75   Pulse 64   Wt 268 lb 12.8 oz (121.9 kg)   SpO2 93%   BMI 40.87 kg/m   Wt Readings from Last 3 Encounters:  04/29/22 268 lb 12.8 oz (121.9 kg)  04/15/22 265 lb 6.4 oz (120.4 kg)  03/10/22 276 lb (125.2 kg)    Physical Exam  General appearance - alert, well appearing, and in no distress Mental status - normal mood, behavior, speech, dress, motor activity, and thought processes Chest -  clear to auscultation, no wheezes, rales or rhonchi, symmetric air entry Heart - normal rate, regular rhythm, normal S1, S2, no murmurs, rubs, clicks or gallops Extremities - peripheral pulses normal, no pedal edema, no clubbing or cyanosis      Latest Ref Rng & Units 03/17/2022   10:01 AM 03/08/2022   11:32 AM 02/01/2022    3:59 PM  CMP  Glucose 70 - 99 mg/dL 103  95  124   BUN 8 - 27 mg/dL '30  17  23   '$ Creatinine 0.57 - 1.00 mg/dL 1.72  1.38  1.91   Sodium 134 - 144 mmol/L 138  145  136   Potassium 3.5 - 5.2 mmol/L 4.0  4.5  3.9   Chloride 96 - 106 mmol/L 101  108  98   CO2 20 - 29 mmol/L '25  22  26   '$ Calcium 8.7 - 10.3 mg/dL 9.2  9.0  9.8   Total Protein 6.5 - 8.1 g/dL   8.9   Total Bilirubin 0.3 - 1.2 mg/dL   0.7   Alkaline Phos 38 - 126 U/L   111   AST 15 - 41 U/L   18   ALT 0 - 44 U/L   16    Lipid Panel     Component Value Date/Time   CHOL 142 08/17/2021 0340   CHOL 177 01/27/2021 1009   TRIG 62 08/17/2021 0340   HDL 38 (L) 08/17/2021 0340   HDL 38 (L) 01/27/2021 1009   CHOLHDL 3.7 08/17/2021 0340   VLDL 12 08/17/2021 0340   LDLCALC 92 08/17/2021 0340   LDLCALC 124 (H) 01/27/2021 1009   LDLDIRECT 190.1 04/16/2013 1531    CBC    Component Value Date/Time   WBC 7.3 02/01/2022 1559   RBC 5.45 (H) 02/01/2022 1559   HGB 15.8 (H) 02/01/2022 1559   HGB 14.1 01/27/2021 1009   HCT 48.4 (  H) 02/01/2022 1559   HCT 42.5 01/27/2021 1009   PLT 238 02/01/2022 1559   PLT 261 01/27/2021 1009   MCV 88.8 02/01/2022 1559   MCV 89 01/27/2021 1009   MCH 29.0 02/01/2022 1559   MCHC 32.6 02/01/2022 1559   RDW 17.0 (H) 02/01/2022 1559   RDW 15.4 01/27/2021 1009   LYMPHSABS 1.3 02/01/2022 1559   LYMPHSABS 2.2 02/10/2020 1000   MONOABS 0.3 02/01/2022 1559   EOSABS 0.0 02/01/2022 1559   EOSABS 0.2 02/10/2020 1000   BASOSABS 0.0 02/01/2022 1559   BASOSABS 0.1 02/10/2020 1000    ASSESSMENT AND PLAN: 1. Essential hypertension At goal.  Continue hydralazine 10 mg twice a day and  hydrochlorothiazide.  Continue to limit salt in the foods.  2. Morbid obesity ([place]) We discussed starting her on phentermine to help with her weight loss efforts.  Patient is wanting to try the medication.  Advised to check blood pressure at least twice a week for the first few weeks of being on the medication as it may cause slight increase in blood pressure.  Also advised to let me know if she develops any heart racing on the medication. Keep upcoming appointment with nutritionist. Patient plans to start going to the gym.  Advised her to start low and go slow like trying to walk on the treadmill for 15 minutes twice a week.  Gradually increase over time. - phentermine 30 MG capsule; Take 1 capsule (30 mg total) by mouth every morning.  Dispense: 30 capsule; Refill: 1  3. Stage 3b chronic kidney disease ([place]) We will continue to monitor.  Stay off NSAIDs. - Ambulatory referral to Nephrology    Patient was given the opportunity to ask questions.  Patient verbalized understanding of the plan and was able to repeat key elements of the plan.   This documentation was completed using Radio producer.  Any transcriptional errors are unintentional.  No orders of the defined types were placed in this encounter.    Requested Prescriptions    No prescriptions requested or ordered in this encounter    No follow-ups on file.  Karle Plumber, MD, FACP

## 2022-04-29 NOTE — Patient Instructions (Signed)
We have prescribed a medication phentermine to help with weight loss.  Continue to monitor your blood pressure while on the medication.  Let me know if there is any significant blood pressure increases.  Let me know if you have have any heart racing. I would encourage you to try to get back into the gym for exercise.  Start low and go slow as we have discussed. Keep your upcoming appointment with the nutritionist.  I have referred you to the kidney specialist for further monitoring of your kidney function.

## 2022-05-02 ENCOUNTER — Other Ambulatory Visit: Payer: Self-pay

## 2022-05-04 ENCOUNTER — Other Ambulatory Visit: Payer: Self-pay

## 2022-05-09 ENCOUNTER — Other Ambulatory Visit: Payer: Medicare Other

## 2022-05-09 DIAGNOSIS — E785 Hyperlipidemia, unspecified: Secondary | ICD-10-CM

## 2022-05-09 LAB — HEPATIC FUNCTION PANEL
ALT: 18 IU/L (ref 0–32)
AST: 16 IU/L (ref 0–40)
Albumin: 4 g/dL (ref 3.9–4.9)
Alkaline Phosphatase: 153 IU/L — ABNORMAL HIGH (ref 44–121)
Bilirubin Total: 0.3 mg/dL (ref 0.0–1.2)
Bilirubin, Direct: 0.11 mg/dL (ref 0.00–0.40)
Total Protein: 7.7 g/dL (ref 6.0–8.5)

## 2022-05-09 LAB — LIPID PANEL
Chol/HDL Ratio: 4.2 ratio (ref 0.0–4.4)
Cholesterol, Total: 198 mg/dL (ref 100–199)
HDL: 47 mg/dL (ref >39)
LDL Chol Calc (NIH): 130 mg/dL — ABNORMAL HIGH (ref 0–99)
Triglycerides: 115 mg/dL (ref 0–149)
VLDL Cholesterol Cal: 21 mg/dL (ref 5–40)

## 2022-05-10 ENCOUNTER — Telehealth: Payer: Self-pay | Admitting: Internal Medicine

## 2022-05-10 DIAGNOSIS — E785 Hyperlipidemia, unspecified: Secondary | ICD-10-CM

## 2022-05-10 DIAGNOSIS — E7841 Elevated Lipoprotein(a): Secondary | ICD-10-CM

## 2022-05-10 LAB — LIPOPROTEIN A (LPA): Lipoprotein (a): 318.9 nmol/L — ABNORMAL HIGH (ref <75.0)

## 2022-05-10 NOTE — Telephone Encounter (Signed)
-----   Message from [name], MD sent at 05/10/2022  6:55 AM EDT ----- Please refer to Dr. Debara Pickett re elevated Lp(a)

## 2022-05-10 NOTE — Telephone Encounter (Signed)
[name], MD  05/09/2022  7:57 PM EDT     Increase atorva to '80mg'$ , lipids and LFTS in 2 months

## 2022-05-10 NOTE — Telephone Encounter (Signed)
Follow Up:     Patient is returning a call, concerning her lab results.

## 2022-05-10 NOTE — Telephone Encounter (Signed)
Left message for patient to call back  

## 2022-05-11 ENCOUNTER — Other Ambulatory Visit: Payer: Self-pay

## 2022-05-11 MED ORDER — ATORVASTATIN CALCIUM 80 MG PO TABS
80.0000 mg | ORAL_TABLET | Freq: Every day | ORAL | 3 refills | Status: DC
  Filled 2022-05-11: qty 90, 90d supply, fill #0
  Filled 2022-09-30: qty 90, 90d supply, fill #1
  Filled 2023-02-03: qty 90, 90d supply, fill #2

## 2022-05-11 NOTE — Telephone Encounter (Signed)
Spoke w patient.  Reviewed that she will increase atorvastatin to 80 mg daily, w plan for lipids/liver in 2 months.  Referreal to Dr. Debara Pickett for Lp(a) elevation.  Pt voices understanding and agreement. Aware she will be called to schedule.

## 2022-05-11 NOTE — Telephone Encounter (Signed)
Patient was returning a phone call back

## 2022-05-12 ENCOUNTER — Other Ambulatory Visit: Payer: Self-pay

## 2022-05-12 ENCOUNTER — Other Ambulatory Visit (HOSPITAL_COMMUNITY): Payer: Self-pay

## 2022-05-13 ENCOUNTER — Encounter: Payer: Self-pay | Admitting: Dietician

## 2022-05-13 ENCOUNTER — Encounter: Payer: Medicare Other | Attending: Internal Medicine | Admitting: Dietician

## 2022-05-13 DIAGNOSIS — Z713 Dietary counseling and surveillance: Secondary | ICD-10-CM | POA: Insufficient documentation

## 2022-05-13 DIAGNOSIS — R7303 Prediabetes: Secondary | ICD-10-CM

## 2022-05-13 DIAGNOSIS — N184 Chronic kidney disease, stage 4 (severe): Secondary | ICD-10-CM | POA: Diagnosis not present

## 2022-05-13 NOTE — Patient Instructions (Addendum)
You have a referral that has been place with:  [facility] [place], [place] [address] [phone]  Avoid foods that have Phos... in the ingredient list. Rinse canned foods and avoid added salt.   Eat more Non-Starchy Vegetables These include greens, broccoli, cauliflower, cabbage, carrots, beets, eggplant, peppers, squash and others. Minimize added sugars and refined grains Rethink what you drink.  Choose beverages without added sugar.  Look for 0 carbs on the label. See the list of whole grains below.  Find alternatives to usual sweet treats. Choose whole foods over processed. Make simple meals at home more often than eating out.  Tips to increase fiber in your diet: (All plants have fiber.  Eat a variety. There are more than are on this list.) Slowly increase the amount of fiber you eat to 25-35 grams per day.  (More is fine if you tolerate it.) Fiber from whole grains, nuts and seeds Quinoa, 1/2 cup = 5 grams Bulgur, 1/2 cup = 4.1 grams Popcorn, 3 cups = 3.6 grams Whole Wheat Spaghetti, 1/2 cup = 3.2 grams Barley, 1/2 cup = 3 grams Oatmeal, 1/2 cup = 2 grams Whole Wheat English Muffin = 3 grams Corn, 1/2 cup = 2.1 grams Brown Rice, 1/2 cup = 1.8 grams Flax seeds, 1 Tablespoon = 2.8 grams Chia seeds, 1 Tablespoon = 11 grams Almonds, 1 ounce = 3.5 grams fiber Fiber from legumes Kidney beans, 1/2 cup 7.9 grams Lentils, 1/2 cup = 7.8 grams Pinto beans, 1/2 cup = 7.7 grams Black beans, 1/2 cup = 7.6 grams Lima beans, 1/2 cup 6.4 grams Chick peas, 1/2 cup = 5.3 grams Black eyed peas, 1/2 cup = 4 grams Fiber from fruits and vegetables Pear, 6 grams Apple. 3.3 grams Raspberries or Blackberries, 3/4 cup = 6 grams Strawberries or Blueberries, 1 cup = 3.4 grams Baked sweet potato 3.8 grams fiber Baked potato with skin 4.4 grams  Peas, 1/2 cup = 4.4 grams  Spinach, 1/2 cup cooked = 3.5 grams  Avocado, 1/2 = 5 grams

## 2022-05-13 NOTE — Progress Notes (Signed)
Medical Nutrition Therapy  Appointment Start time:  62  Appointment End time:  43  Primary concerns today: unsure  Referral diagnosis: CKD stage 4, morbid obesity Preferred learning style: no preference indicated Learning readiness: not ready, contemplating   NUTRITION ASSESSMENT   Anthropometrics  67" 265 lbs 05/13/2022 No recent weight changes  Clinical Medical Hx: CKD stage 4, obesity, HTN, HLD, COPD, GERD, no longer smokes and denies vaping, patient meets criteria for prediabetes Medications: see list to include phentermine, lasix Labs: A1C 6.1% 08/17/2021, eGFR 32, BUN 30, Creatine 23, potassium 4 on 03/17/2022 GFR 42 03/08/2022, positive protein in urine 01/2022 Notable Signs/Symptoms: none  Lifestyle & Dietary Hx Allergic to fish and does not eat shellfish. Patient lives alone.  She is retired from [employer].  Estimated daily fluid intake: 96 oz Supplements: none Sleep: 8-12 Stress / self-care: normal Current average weekly physical activity: walks occasionally  24-Hr Dietary Recall First Meal: coffee with cream and splenda and occasional cornflakes and whole milk or almond milk, or sausage and toast or waffle with butter and syrup Snack: none Second Meal: skips at times - leftovers or "whatever I feel like" Snack: berries or cherries, peaches, pork skins candy Third Meal: [employer] fried chicken, roll Snack: none Beverages: water, coffee with cream and splenda, sweet tea, gatorade, occasional lemonade, rare regular soda  Estimated Energy Needs Calories: 2200 Protein: 75g   NUTRITION DIAGNOSIS  NB-1.1 Food and nutrition-related knowledge deficit As related to diet and renal disease.  As evidenced by diet hx and patient report.   NUTRITION INTERVENTION  Nutrition education (E-1) on the following topics:  Renal concerns and diet implication.  Discussed meat portion size, choose meatless meals at times.   Choose more vegetables and fruits.  No need for a  potassium restriction at this time as potassium labs are normal. Choose whole grains Portions Low sodium Low phosphorous.  Avoid foods with added phos... in the ingredients Benefits of activity Mindfulness - choices and portions  Handouts Provided Include  NKD national kidney diet placemat for those without dialysis  Learning Style & Readiness for Change Teaching method utilized: Visual & Auditory  Demonstrated degree of understanding via: Teach Back  Barriers to learning/adherence to lifestyle change: none   Goals: You have a referral that has been place with:  [facility] [place], [place] [address] [phone]  Avoid foods that have Phos... in the ingredient list. Rinse canned foods and avoid added salt.   Eat more Non-Starchy Vegetables These include greens, broccoli, cauliflower, cabbage, carrots, beets, eggplant, peppers, squash and others. Minimize added sugars and refined grains Rethink what you drink.  Choose beverages without added sugar.  Look for 0 carbs on the label. See the list of whole grains below.  Find alternatives to usual sweet treats. Choose whole foods over processed. Make simple meals at home more often than eating out.  Tips to increase fiber in your diet: (All plants have fiber.  Eat a variety. There are more than are on this list.) Slowly increase the amount of fiber you eat to 25-35 grams per day.  (More is fine if you tolerate it.) Fiber from whole grains, nuts and seeds Quinoa, 1/2 cup = 5 grams Bulgur, 1/2 cup = 4.1 grams Popcorn, 3 cups = 3.6 grams Whole Wheat Spaghetti, 1/2 cup = 3.2 grams Barley, 1/2 cup = 3 grams Oatmeal, 1/2 cup = 2 grams Whole Wheat English Muffin = 3 grams Corn, 1/2 cup = 2.1 grams [employer]  Rice, 1/2 cup = 1.8 grams Flax seeds, 1 Tablespoon = 2.8 grams Chia seeds, 1 Tablespoon = 11 grams Almonds, 1 ounce = 3.5 grams fiber Fiber from legumes Kidney beans, 1/2 cup 7.9 grams Lentils, 1/2 cup =  7.8 grams Pinto beans, 1/2 cup = 7.7 grams Black beans, 1/2 cup = 7.6 grams Lima beans, 1/2 cup 6.4 grams Chick peas, 1/2 cup = 5.3 grams Black eyed peas, 1/2 cup = 4 grams Fiber from fruits and vegetables Pear, 6 grams Apple. 3.3 grams Raspberries or Blackberries, 3/4 cup = 6 grams Strawberries or Blueberries, 1 cup = 3.4 grams Baked sweet potato 3.8 grams fiber Baked potato with skin 4.4 grams  Peas, 1/2 cup = 4.4 grams  Spinach, 1/2 cup cooked = 3.5 grams  Avocado, 1/2 = 5 grams   MONITORING & EVALUATION Dietary intake, weekly physical activity prn  Next Steps  Patient is to call for questions.

## 2022-05-23 ENCOUNTER — Other Ambulatory Visit (HOSPITAL_COMMUNITY): Payer: Self-pay

## 2022-05-23 ENCOUNTER — Other Ambulatory Visit: Payer: Self-pay | Admitting: Internal Medicine

## 2022-05-23 DIAGNOSIS — K219 Gastro-esophageal reflux disease without esophagitis: Secondary | ICD-10-CM

## 2022-05-23 DIAGNOSIS — R0602 Shortness of breath: Secondary | ICD-10-CM | POA: Diagnosis not present

## 2022-05-23 MED ORDER — OMEPRAZOLE 20 MG PO CPDR
20.0000 mg | DELAYED_RELEASE_CAPSULE | Freq: Every day | ORAL | 3 refills | Status: DC
  Filled 2022-05-23: qty 30, 30d supply, fill #0
  Filled 2022-06-24: qty 30, 30d supply, fill #1
  Filled 2022-08-12: qty 30, 30d supply, fill #2
  Filled 2022-09-17: qty 30, 30d supply, fill #3

## 2022-06-02 ENCOUNTER — Other Ambulatory Visit: Payer: Self-pay

## 2022-06-02 ENCOUNTER — Other Ambulatory Visit: Payer: Self-pay | Admitting: Internal Medicine

## 2022-06-03 ENCOUNTER — Other Ambulatory Visit (HOSPITAL_COMMUNITY): Payer: Self-pay

## 2022-06-03 ENCOUNTER — Other Ambulatory Visit: Payer: Self-pay

## 2022-06-04 ENCOUNTER — Other Ambulatory Visit (HOSPITAL_COMMUNITY): Payer: Self-pay

## 2022-06-23 DIAGNOSIS — R0602 Shortness of breath: Secondary | ICD-10-CM | POA: Diagnosis not present

## 2022-06-25 ENCOUNTER — Other Ambulatory Visit (HOSPITAL_COMMUNITY): Payer: Self-pay

## 2022-07-07 ENCOUNTER — Other Ambulatory Visit: Payer: Self-pay | Admitting: Internal Medicine

## 2022-07-08 ENCOUNTER — Ambulatory Visit: Payer: Self-pay | Admitting: Licensed Clinical Social Worker

## 2022-07-08 NOTE — Telephone Encounter (Signed)
Requested medication (s) are due for refill today - yes  Requested medication (s) are on the active medication list -yes  Future visit scheduled -yes  Last refill: 04/29/22 #30 1RF  Notes to clinic: non delegated Rx  Requested Prescriptions  Pending Prescriptions Disp Refills   phentermine 30 MG capsule 30 capsule 1    Sig: Take 1 capsule (30 mg total) by mouth every morning.     Not Delegated - Neurology: Anticonvulsants - Controlled - phentermine hydrochloride Failed - 07/07/2022  7:28 PM      Failed - This refill cannot be delegated      Failed - eGFR in normal range and within 360 days    GFR, Est African American  Date Value Ref Range Status  01/15/2016 56 (L) >=60 mL/min Final   GFR calc Af Amer  Date Value Ref Range Status  02/10/2020 63 >59 mL/min/1.73 Final   GFR, Est Non African American  Date Value Ref Range Status  01/15/2016 48 (L) >=60 mL/min Final    Comment:      The estimated GFR is a calculation valid for adults (>=45 years old) that uses the CKD-EPI algorithm to adjust for age and sex. It is   not to be used for children, pregnant women, hospitalized patients,    patients on dialysis, or with rapidly changing kidney function. According to the NKDEP, eGFR >89 is normal, 60-89 shows mild impairment, 30-59 shows moderate impairment, 15-29 shows severe impairment and <15 is ESRD.      GFR, Estimated  Date Value Ref Range Status  02/01/2022 29 (L) >60 mL/min Final    Comment:    (NOTE) Calculated using the CKD-EPI Creatinine Equation (2021)    GFR  Date Value Ref Range Status  08/14/2014 52.59 (L) >60.00 mL/min Final   eGFR  Date Value Ref Range Status  03/17/2022 32 (L) >59 mL/min/1.73 Final         Failed - Cr in normal range and within 360 days    Creat  Date Value Ref Range Status  01/15/2016 1.22 (H) 0.50 - 0.99 mg/dL Final   Creatinine, Ser  Date Value Ref Range Status  03/17/2022 1.72 (H) 0.57 - 1.00 mg/dL Final         Passed -  Last BP in normal range    BP Readings from Last 1 Encounters:  04/29/22 124/75         Passed - Valid encounter within last 6 months    Recent Outpatient Visits           2 months ago Essential hypertension   [place], Deborah B, MD   3 months ago Morbid obesity [facility])   [place] Ladell Pier, MD   4 months ago Encounter for medication review   [place], Jarome Matin, RPH-CPP   4 months ago Essential hypertension   [place], Deborah B, MD   4 months ago Acute pharyngitis, unspecified etiology   [facility] at [facility], Loraine Grip, PA-C       Future Appointments             In 1 month Ladell Pier, MD [place]   In 2 months Ali Lowe, Arlie Solomons, MD [facility] [place]. [facility], [facility]   In 2 months  Pixie Casino, MD [facility], [facility]            Passed - Weight completed in the last 3 months    Wt Readings from Last 1 Encounters:  05/13/22 265 lb (120.2 kg)            Requested Prescriptions  Pending Prescriptions Disp Refills   phentermine 30 MG capsule 30 capsule 1    Sig: Take 1 capsule (30 mg total) by mouth every morning.     Not Delegated - Neurology: Anticonvulsants - Controlled - phentermine hydrochloride Failed - 07/07/2022  7:28 PM      Failed - This refill cannot be delegated      Failed - eGFR in normal range and within 360 days    GFR, Est African American  Date Value Ref Range Status  01/15/2016 56 (L) >=60 mL/min Final   GFR calc Af Amer  Date Value Ref Range Status  02/10/2020 63 >59 mL/min/1.73 Final   GFR, Est Non African American  Date Value Ref Range Status  01/15/2016 48 (L) >=60 mL/min Final    Comment:      The estimated GFR is a calculation valid for  adults (>=28 years old) that uses the CKD-EPI algorithm to adjust for age and sex. It is   not to be used for children, pregnant women, hospitalized patients,    patients on dialysis, or with rapidly changing kidney function. According to the NKDEP, eGFR >89 is normal, 60-89 shows mild impairment, 30-59 shows moderate impairment, 15-29 shows severe impairment and <15 is ESRD.      GFR, Estimated  Date Value Ref Range Status  02/01/2022 29 (L) >60 mL/min Final    Comment:    (NOTE) Calculated using the CKD-EPI Creatinine Equation (2021)    GFR  Date Value Ref Range Status  08/14/2014 52.59 (L) >60.00 mL/min Final   eGFR  Date Value Ref Range Status  03/17/2022 32 (L) >59 mL/min/1.73 Final         Failed - Cr in normal range and within 360 days    Creat  Date Value Ref Range Status  01/15/2016 1.22 (H) 0.50 - 0.99 mg/dL Final   Creatinine, Ser  Date Value Ref Range Status  03/17/2022 1.72 (H) 0.57 - 1.00 mg/dL Final         Passed - Last BP in normal range    BP Readings from Last 1 Encounters:  04/29/22 124/75         Passed - Valid encounter within last 6 months    Recent Outpatient Visits           2 months ago Essential hypertension   [place], Deborah B, MD   3 months ago Morbid obesity [facility])   [place] Ladell Pier, MD   4 months ago Encounter for medication review   [place], Jarome Matin, RPH-CPP   4 months ago Essential hypertension   [place], Deborah B, MD   4 months ago Acute pharyngitis, unspecified etiology   [facility], Loraine Grip, PA-C       Future Appointments             In 1 month Ladell Pier, MD [place]   In 2 months Ali Lowe, Arlie Solomons, MD [facility]  [facility] [place],  [facility]   In 2 months Hilty, Nadean Corwin, MD [facility], [facility]            Passed - Weight completed in the last 3 months    Wt Readings from Last 1 Encounters:  05/13/22 265 lb (120.2 kg)

## 2022-07-09 MED ORDER — PHENTERMINE HCL 30 MG PO CAPS
30.0000 mg | ORAL_CAPSULE | ORAL | 1 refills | Status: DC
  Filled 2022-07-09: qty 30, 30d supply, fill #0
  Filled 2022-08-12: qty 30, 30d supply, fill #1

## 2022-07-11 ENCOUNTER — Other Ambulatory Visit (HOSPITAL_COMMUNITY): Payer: Self-pay

## 2022-07-13 NOTE — Patient Outreach (Signed)
  Care Coordination   Initial Visit Note   07/13/2022 Name: Krystal Allen MRN: [id number] DOB: 12/01/1955  Krystal Allen is a 66 y.o. year old female who sees Ladell Pier, MD for primary care. I spoke with  Krystal Allen by phone today.  What matters to the patients health and wellness today?  Informed pt of Care Coordination Services    Goals Addressed             This Visit's Progress    COMPLETED: Care Coordination Activities-No follow up required       Care Coordination Interventions: Active listening / Reflection utilized  Emotional Support Provided LCSW informed patient of care coordination services. Pt is not interested at this time and agreed to contact PCP, should needs arise  Reviewed upcoming appts        SDOH assessments and interventions completed:  No     Care Coordination Interventions Activated:  Yes  Care Coordination Interventions:  Yes, provided   Follow up plan: No further intervention required.   Encounter Outcome:  Pt. Visit Completed   Krystal Allen, MSW, [email or web address] Phone [phone] 10:55 AM

## 2022-07-13 NOTE — Patient Instructions (Signed)
Visit Information  Thank you for taking time to visit with me today. Please don't hesitate to contact me if I can be of assistance to you.   Following are the goals we discussed today:   Goals Addressed             This Visit's Progress    COMPLETED: Care Coordination Activities-No follow up required       Care Coordination Interventions: Active listening / Reflection utilized  Emotional Support Provided LCSW informed patient of care coordination services. Pt is not interested at this time and agreed to contact PCP, should needs arise  Reviewed upcoming appts        If you are experiencing a Mental Health or [place] or need someone to talk to, please call the Suicide and Crisis Lifeline: 988 call 911   Patient verbalizes understanding of instructions and care plan provided today and agrees to view in [place]. Active MyChart status and patient understanding of how to access instructions and care plan via MyChart confirmed with patient.     No further follow up required:    Christa See, MSW, [email or web address] Phone [phone] 10:56 AM

## 2022-07-19 ENCOUNTER — Other Ambulatory Visit: Payer: Self-pay | Admitting: Physician Assistant

## 2022-07-19 ENCOUNTER — Other Ambulatory Visit (HOSPITAL_COMMUNITY): Payer: Self-pay

## 2022-07-19 DIAGNOSIS — I10 Essential (primary) hypertension: Secondary | ICD-10-CM

## 2022-07-20 ENCOUNTER — Other Ambulatory Visit (HOSPITAL_COMMUNITY): Payer: Self-pay

## 2022-07-23 ENCOUNTER — Other Ambulatory Visit (HOSPITAL_COMMUNITY): Payer: Self-pay

## 2022-07-23 MED ORDER — HYDROCHLOROTHIAZIDE 25 MG PO TABS
25.0000 mg | ORAL_TABLET | Freq: Every day | ORAL | 1 refills | Status: DC
  Filled 2022-07-23: qty 90, 90d supply, fill #0
  Filled 2022-11-07: qty 90, 90d supply, fill #1

## 2022-07-24 DIAGNOSIS — R0602 Shortness of breath: Secondary | ICD-10-CM | POA: Diagnosis not present

## 2022-07-25 ENCOUNTER — Other Ambulatory Visit: Payer: Self-pay

## 2022-08-10 ENCOUNTER — Emergency Department (HOSPITAL_COMMUNITY)
Admission: EM | Admit: 2022-08-10 | Discharge: 2022-08-10 | Disposition: A | Discharge status: Home / Self Care | Payer: Medicare Other | Attending: Emergency Medicine | Admitting: Emergency Medicine

## 2022-08-10 ENCOUNTER — Emergency Department (HOSPITAL_COMMUNITY): Payer: Medicare Other

## 2022-08-10 ENCOUNTER — Encounter (HOSPITAL_COMMUNITY): Payer: Self-pay

## 2022-08-10 ENCOUNTER — Other Ambulatory Visit: Payer: Self-pay

## 2022-08-10 DIAGNOSIS — Z91040 Latex allergy status: Secondary | ICD-10-CM | POA: Diagnosis not present

## 2022-08-10 DIAGNOSIS — Z7982 Long term (current) use of aspirin: Secondary | ICD-10-CM | POA: Diagnosis not present

## 2022-08-10 DIAGNOSIS — T7840XA Allergy, unspecified, initial encounter: Secondary | ICD-10-CM | POA: Diagnosis not present

## 2022-08-10 DIAGNOSIS — R0602 Shortness of breath: Secondary | ICD-10-CM | POA: Diagnosis not present

## 2022-08-10 MED ORDER — EPINEPHRINE 0.3 MG/0.3ML IJ SOAJ
0.3000 mg | INTRAMUSCULAR | 2 refills | Status: DC | PRN
  Filled 2022-08-10: qty 2, 30d supply, fill #0

## 2022-08-10 MED ORDER — EPINEPHRINE 0.3 MG/0.3ML IJ SOAJ
0.3000 mg | Freq: Once | INTRAMUSCULAR | Status: AC
  Administered 2022-08-10: 0.3 mg via INTRAMUSCULAR
  Filled 2022-08-10: qty 0.3

## 2022-08-10 MED ORDER — METHYLPREDNISOLONE SODIUM SUCC 125 MG IJ SOLR
125.0000 mg | Freq: Once | INTRAMUSCULAR | Status: AC
  Administered 2022-08-10: 125 mg via INTRAVENOUS
  Filled 2022-08-10: qty 2

## 2022-08-10 MED ORDER — FAMOTIDINE IN NACL 20-0.9 MG/50ML-% IV SOLN
20.0000 mg | Freq: Once | INTRAVENOUS | Status: AC
  Administered 2022-08-10: 20 mg via INTRAVENOUS
  Filled 2022-08-10: qty 50

## 2022-08-10 MED ORDER — DIPHENHYDRAMINE HCL 50 MG/ML IJ SOLN
25.0000 mg | Freq: Once | INTRAMUSCULAR | Status: AC
  Administered 2022-08-10: 25 mg via INTRAVENOUS
  Filled 2022-08-10: qty 1

## 2022-08-10 MED ORDER — HYDROXYZINE HCL 25 MG PO TABS: 25.0000 mg | ORAL_TABLET | Freq: Three times a day (TID) | ORAL | 0 refills | Status: DC | PRN

## 2022-08-10 MED ORDER — PREDNISONE 10 MG PO TABS
40.0000 mg | ORAL_TABLET | Freq: Every day | ORAL | 0 refills | Status: DC
  Filled 2022-08-10: qty 16, 4d supply, fill #0

## 2022-08-10 MED ORDER — PREDNISONE 10 MG PO TABS: 40.0000 mg | ORAL_TABLET | Freq: Every day | ORAL | 0 refills | Status: AC

## 2022-08-10 MED ORDER — EPINEPHRINE 0.3 MG/0.3ML IJ SOAJ: 0.3000 mg | INTRAMUSCULAR | 2 refills | Status: DC | PRN

## 2022-08-10 MED ORDER — HYDROXYZINE HCL 25 MG PO TABS: 50.0000 mg | ORAL_TABLET | Freq: Once | ORAL | Status: DC

## 2022-08-10 NOTE — ED Provider Triage Note (Signed)
Emergency Medicine Provider Triage Evaluation Note  Krystal Allen , a 66 y.o. female  was evaluated in triage.  Pt complains of an allergic reaction.  Reports that last night around 830 or 9 she began to note hives on her body.  She took 2 Benadryl but this did not help her.  This morning she became short of breath with worsening hives.  Says that she has felt herself and heard herself wheezing  No known food allergies but reports some antibiotic allergies.  Says that this started after eating a peanut butter sandwich, [place] fried chicken and a candy bar  Review of Systems  Positive: Wheezing, urticaria, throat narrowing Negative:   Physical Exam  BP 136/86 (BP Location: Right Arm)   Pulse 90   Temp 97.8 F (36.6 C) (Oral)   Resp 16   SpO2 98%  Gen:   Awake, no distress   Resp:  Normal effort  MSK:   Moves extremities without difficulty  Other:  Mild wheezing in right upper lung lobe.  Airway clear but appears slightly inflamed.  No signs of angioedema.  Urticaria throughout patient's body and increased work of breathing.  Likely also an anxiety component  Medical Decision Making  Medically screening exam initiated at 8:17 AM.  Appropriate orders placed.  Krystal Allen was informed that the remainder of the evaluation will be completed by another provider, this initial triage assessment does not replace that evaluation, and the importance of remaining in the ED until their evaluation is complete.    Triage nurse and charge nurse notified that patient should have a room as soon as possible.  Nursing staff also instructed to place an IV for IV medications.  No signs of angioedema or anaphylactic shock at this time however patient is not an appropriate waiting room candidate.   [name], PA-C 08/10/22 0820

## 2022-08-10 NOTE — ED Triage Notes (Signed)
Allergic reaction that started last night but worse this am.  Patient covered in hives, sob and eye swelling. No throat closing but reports mouth is dry Reports last allergic reaction her throat was closing.

## 2022-08-10 NOTE — ED Provider Notes (Signed)
[facility] EMERGENCY DEPARTMENT Provider Note   CSN: [id number] Arrival date & time: 08/10/22  4098     History  Chief Complaint  Patient presents with   Allergic Reaction    Krystal Allen is a 66 y.o. female presenting to ED with concern for allergic reaction.  The patient ports she noted itchy hives on arms and legs earlier last night, went to bed and woke up this morning with hives all over her body.  She did feel that she had some tongue swelling which is gotten better.  She received epinephrine, steroids, Pepcid in triage.  She did have some shortness of breath earlier today but this is resolved.  She says is only second time she had a reaction like this.  She does not know what she may have reacted to.  However she did have peanut butter yesterday with dinner, and she does have a strong family history of PB allergies, though she has tolerated PB in the past.  She also had mango which she does not typically eat, as well as coconut with dinner last night.  Per review medical records the patient was seen in the ED in April, 5 months ago, for possible allergic reaction versus urticaria.  HPI     Home Medications Prior to Admission medications   Medication Sig Start Date End Date Taking? Authorizing Provider  acetaminophen (TYLENOL 8 HOUR) 650 MG CR tablet Take 1 tablet (650 mg total) by mouth 2 (two) times daily as needed for pain. 04/06/17  Yes Funches, Adriana Mccallum, MD  albuterol (PROAIR HFA) 108 (90 Base) MCG/ACT inhaler Inhale 2 puffs into the lungs every 4 (four) hours as needed for wheezing or shortness of breath. 01/19/22  Yes Ladell Pier, MD  albuterol (PROVENTIL) (2.5 MG/3ML) 0.083% nebulizer solution TAKE 3 MLS BY NEBULIZATION EVERY 6 (SIX) HOURS AS NEEDED FOR WHEEZING OR SHORTNESS OF BREATH. Patient taking differently: Take 2.5 mg by nebulization every 6 (six) hours as needed for wheezing or shortness of breath. 08/20/21  Yes Idamae Schuller, MD  aspirin  EC 81 MG tablet Take 1 tablet (81 mg total) by mouth daily. 05/12/20  Yes Ladell Pier, MD  atorvastatin (LIPITOR) 80 MG tablet Take 1 tablet (80 mg total) by mouth daily. 05/11/22  Yes [name], MD  cetirizine (ZYRTEC) 10 MG tablet Take 1 tablet (10 mg total) by mouth daily. 02/07/22  Yes Mayers, Cari S, PA-C  diphenhydrAMINE (BENADRYL) 25 mg capsule Take 25-50 mg by mouth every 6 (six) hours as needed for itching or allergies.   Yes [provider]  fluticasone furoate-vilanterol (BREO ELLIPTA) 200-25 MCG/ACT AEPB Inhale 1 puff into the lungs once daily. 04/15/22  Yes Margaretha Seeds, MD  furosemide (LASIX) 20 MG tablet Take 1 tablet (20 mg total) by mouth daily. 03/10/22  Yes [name], MD  hydrALAZINE (APRESOLINE) 10 MG tablet Take 1 tablet (10 mg total) by mouth 2 (two) times daily. 03/08/22  Yes Ladell Pier, MD  hydrochlorothiazide (HYDRODIURIL) 25 MG tablet Take 1 tablet (25 mg total) by mouth daily. 07/23/22  Yes Ladell Pier, MD  hydrOXYzine (ATARAX) 25 MG tablet Take 1 tablet (25 mg total) by mouth every 8 (eight) hours as needed for up to 15 doses for itching. 08/10/22  Yes Gurfateh Mcclain, Carola Rhine, MD  Olopatadine HCl 0.2 % SOLN Apply 1 drop to affected eyes daily as directed. Patient taking differently: Place 1 drop into both eyes daily as needed (  allergies). 02/07/22  Yes Mayers, Cari S, PA-C  omeprazole (PRILOSEC) 20 MG capsule Take 1 capsule (20 mg total) by mouth daily. 05/23/22  Yes Ladell Pier, MD  phentermine 30 MG capsule Take 1 capsule (30 mg total) by mouth every morning. 07/09/22  Yes Ladell Pier, MD  sodium chloride (OCEAN) 0.65 % nasal spray Place 2 sprays into both nostrils every 4 (four) hours. Patient taking differently: Place 2 sprays into the nose every 4 (four) hours as needed for congestion. 08/20/21  Yes Idamae Schuller, MD  Tiotropium Bromide Monohydrate (SPIRIVA RESPIMAT) 2.5 MCG/ACT AERS Inhale 2 puffs into the lungs daily.  11/25/21  Yes Margaretha Seeds, MD  Elastic Bandages & Supports ([place]) Arthur R60.0 Patient not taking: Reported on 04/29/2022 01/25/22   Gildardo Pounds, NP  EPINEPHrine 0.3 mg/0.3 mL IJ SOAJ injection Inject 0.3 mg into the muscle as needed for anaphylaxis. 08/10/22   Wyvonnia Dusky, MD  predniSONE (DELTASONE) 10 MG tablet Take 4 tablets (40 mg total) by mouth daily with breakfast for 4 days. 08/11/22 08/15/22  Wyvonnia Dusky, MD  Respiratory Therapy Supplies (FLUTTER) DEVI Use as directed 12/01/17   Marshell Garfinkel, MD      Allergies    Sulfa antibiotics, Chantix [varenicline tartrate], Fish allergy, Hygroton [chlorthalidone], Penicillins, and Latex    Review of Systems   Review of Systems  Physical Exam Updated Vital Signs BP 130/74   Pulse 76   Temp 98.1 F (36.7 C) (Oral)   Resp 13   Ht '5\' 7"'$  (1.702 m)   Wt 120.2 kg   SpO2 95%   BMI 41.50 kg/m  Physical Exam Constitutional:      General: She is not in acute distress. HENT:     Head: Normocephalic and atraumatic.  Eyes:     Conjunctiva/sclera: Conjunctivae normal.     Pupils: Pupils are equal, round, and reactive to light.  Cardiovascular:     Rate and Rhythm: Normal rate and regular rhythm.  Pulmonary:     Effort: Pulmonary effort is normal. No respiratory distress.     Comments: Oropharynx non-erythematous.  No tonsillar swelling or exudate.  No uvular deviation.  No drooling. No brawny edema. No stridor. Voice is not muffled. Abdominal:     General: There is no distension.     Tenderness: There is no abdominal tenderness.  Skin:    General: Skin is warm and dry.     Comments: Hives involving the upper chest, bilateral arms, and lower face  Neurological:     General: No focal deficit present.     Mental Status: She is alert. Mental status is at baseline.  Psychiatric:        Mood and Affect: Mood normal.        Behavior: Behavior normal.     ED Results / Procedures /  Treatments   Labs (all labs ordered are listed, but only abnormal results are displayed) Labs Reviewed - No data to display  EKG None  Radiology DG Chest 2 View  Result Date: 08/10/2022 CLINICAL DATA:  Shortness of breath. Allergic reaction which began last night and progressed this morning. EXAM: CHEST - 2 VIEW COMPARISON:  02/01/2022 FINDINGS: Stable cardiomediastinal contours. No pleural effusion or edema. No airspace opacities identified. No signs of atelectasis or pneumothorax. Spondylosis noted within the thoracic spine. IMPRESSION: No active cardiopulmonary abnormalities. Electronically Signed   By: Kerby Moors M.D.   On: 08/10/2022 08:52    Procedures .  Critical Care  Performed by: Wyvonnia Dusky, MD Authorized by: Wyvonnia Dusky, MD   Critical care provider statement:    Critical care time (minutes):  35   Critical care time was exclusive of:  Separately billable procedures and treating other patients   Critical care was necessary to treat or prevent imminent or life-threatening deterioration of the following conditions: allergic reaction.   Critical care was time spent personally by me on the following activities:  Ordering and performing treatments and interventions, ordering and review of laboratory studies, ordering and review of radiographic studies, pulse oximetry, review of old charts, examination of patient and evaluation of patient's response to treatment   Care discussed with: admitting provider   Comments:     Allergic reaction with potential airway involvement and hives, monitoring after epinephrine     Medications Ordered in ED Medications  diphenhydrAMINE (BENADRYL) injection 25 mg (25 mg Intravenous Given 08/10/22 0828)  EPINEPHrine (EPI-PEN) injection 0.3 mg (0.3 mg Intramuscular Given 08/10/22 0827)  methylPREDNISolone sodium succinate (SOLU-MEDROL) 125 mg/2 mL injection 125 mg (125 mg Intravenous Given 08/10/22 0827)  famotidine (PEPCID) IVPB 20  mg premix (0 mg Intravenous Stopped 08/10/22 1321)    ED Course/ Medical Decision Making/ A&P                           Medical Decision Making Risk Prescription drug management.   This patient presents to the ED with concern for concern for allergic reaction, with diffuse hives, and report of tongue tingling or potentially airway involvement.  This may constitute anaphylaxis based on 2 system involvement.  She does not demonstrate evidence of shock.  On my examination her airways patent, she is not requiring intubation.  Her hives appear to be improving per her own report.  We will provide some additional Atarax on top of the medicine she received in triage for itching now.  It is also possible this is another urticarial reaction.  These reactions only started this past year per her report.  It is not clear what would be triggering these reactions, this does not appear consistent with angioedema to me.  She will need referral to the allergy center for further testing after this.  She will also need a prescription for an epinephrine pen at home.  She ready has Benadryl and took some prior to arriving  Patient received IM epinephrine, IV steroids, IV Pepcid.  I ordered imaging studies including x-ray of the chest I independently visualized and interpreted imaging which showed no acute abnormalities I agree with the radiologist interpretation  The patient was maintained on a cardiac monitor.  I personally viewed and interpreted the cardiac monitored which showed an underlying rhythm of: Normal sinus rhythm  I have reviewed the patients home medicines and have made adjustments as needed  Test Considered: No emergent indication for blood work at this time.  The patient is stable  After the interventions noted above, I reevaluated the patient and found that they have: improved   Dispostion:  After consideration of the diagnostic results and the patients response to treatment, I feel  that the patent would benefit from close outpatient follow-up and specialist referral.  I spoke to the patient and her daughter at the bedside at this may in fact be a food allergy, I would recommend that they avoid peanut butter, also mango and coconut.  Given that there is a family history of allergies to peanut butter, particularly  in the daughter.  This is a temporary measure until they have allergen testing performed at the allergy clinic.  They verbalized understanding.         Final Clinical Impression(s) / ED Diagnoses Final diagnoses:  Allergic reaction, initial encounter    Rx / DC Orders ED Discharge Orders          Ordered    predniSONE (DELTASONE) 10 MG tablet  Daily with breakfast,   Status:  Discontinued        08/10/22 1418    predniSONE (DELTASONE) 10 MG tablet  Daily with breakfast        08/10/22 1510    EPINEPHrine 0.3 mg/0.3 mL IJ SOAJ injection  As needed,   Status:  Discontinued        08/10/22 1418    EPINEPHrine 0.3 mg/0.3 mL IJ SOAJ injection  As needed        08/10/22 1510    hydrOXYzine (ATARAX) 25 MG tablet  Every 8 hours PRN        08/10/22 1510              Wyvonnia Dusky, MD 08/10/22 1527

## 2022-08-12 ENCOUNTER — Other Ambulatory Visit (HOSPITAL_COMMUNITY): Payer: Self-pay

## 2022-08-16 ENCOUNTER — Encounter: Payer: Self-pay | Admitting: Internal Medicine

## 2022-08-16 ENCOUNTER — Ambulatory Visit: Payer: Medicare Other | Attending: Internal Medicine | Admitting: Internal Medicine

## 2022-08-16 ENCOUNTER — Telehealth: Payer: Self-pay | Admitting: Emergency Medicine

## 2022-08-16 ENCOUNTER — Other Ambulatory Visit: Payer: Self-pay

## 2022-08-16 VITALS — BP 128/82 | HR 71 | Temp 97.8°F | Ht 67.0 in | Wt 269.0 lb

## 2022-08-16 DIAGNOSIS — E785 Hyperlipidemia, unspecified: Secondary | ICD-10-CM | POA: Diagnosis not present

## 2022-08-16 DIAGNOSIS — N1832 Chronic kidney disease, stage 3b: Secondary | ICD-10-CM

## 2022-08-16 DIAGNOSIS — J069 Acute upper respiratory infection, unspecified: Secondary | ICD-10-CM | POA: Diagnosis not present

## 2022-08-16 DIAGNOSIS — I10 Essential (primary) hypertension: Secondary | ICD-10-CM

## 2022-08-16 DIAGNOSIS — Z2911 Encounter for prophylactic immunotherapy for respiratory syncytial virus (RSV): Secondary | ICD-10-CM

## 2022-08-16 DIAGNOSIS — L509 Urticaria, unspecified: Secondary | ICD-10-CM | POA: Diagnosis not present

## 2022-08-16 DIAGNOSIS — J449 Chronic obstructive pulmonary disease, unspecified: Secondary | ICD-10-CM

## 2022-08-16 DIAGNOSIS — Z23 Encounter for immunization: Secondary | ICD-10-CM

## 2022-08-16 MED ORDER — PREDNISONE 10 MG PO TABS
10.0000 mg | ORAL_TABLET | Freq: Every day | ORAL | 0 refills | Status: DC
  Filled 2022-08-16: qty 4, 4d supply, fill #0

## 2022-08-16 MED ORDER — PHENTERMINE HCL 37.5 MG PO CAPS
37.5000 mg | ORAL_CAPSULE | ORAL | 1 refills | Status: DC
  Filled 2022-08-16 – 2022-10-03 (×3): qty 30, 30d supply, fill #0
  Filled 2022-11-07: qty 30, 30d supply, fill #1

## 2022-08-16 MED ORDER — RSVPREF3 VAC RECOMB ADJUVANTED 120 MCG/0.5ML IM SUSR
0.5000 mL | Freq: Once | INTRAMUSCULAR | 0 refills | Status: AC
  Filled 2022-08-16: qty 1, 1d supply, fill #0

## 2022-08-16 NOTE — Telephone Encounter (Signed)
Copied from [place] [id number]. Topic: General - Other >> Aug 16, 2022 10:07 AM Chapman Fitch wrote: Reason for CRM: [employer] calling to check status of form for oxygen re certification / please advise / they will fax another today

## 2022-08-16 NOTE — Patient Instructions (Signed)
We have increased the phentermine to 37.5 mg daily to see if it would help bring about some weight loss. We will have you follow-up in 2 months to see how you are doing in regards to the weight loss. I encourage you to try to move more.  This will help augment the effect of the phentermine.

## 2022-08-16 NOTE — Progress Notes (Signed)
Patient ID: ANDE THERRELL, female    DOB: 1956-03-25  MRN: [id number]  CC: Hypertension (HTN f/u. Interested in [place]. /Interested in referral for an allergist. Velta Addison to flu vax.)   Subjective: Krystal Allen is a 66 y.o. female who presents for chronic disease management. Her concerns today include:  Pt with hx of HTN, HL, aortic atherosclerosis, COPD, ILD (pulmonary Langerhans' cell cell histiocytosis), nocturnal hypoxia followed by pulmonary, GERD, CKD 2-3, preDM/obesity, former tob dep, DJD c-spine, spinal stenosis, stress incont, adenomatous colon polyp.    Seen in ER 08/10/2022 with acute hives episode.  Started the night before.  Not sure if it was food allergy.  Had something with peanut butter, mango and coconut with dinner the night before. 3rd episode for this yr of acute allergic type rxn  COPD/ILD:  C/o having Cold x 5 days Cough productive of green/yellow phlegm and hoarsness.  No fever.  SOB but not more than her baseline.  No sick contacts. Did not do COVID test and declines testing today.   Using inhalers as prescribed -Breo, Spiriva.  Using Albuterol more often about Q 4.   Has not had to use O2 at nights as often.    -Would like RSV vaccine and flu shots today  Obesity: Started on phentermine on last visit with me in June.  Med has dec appetite but no wgh loss. No getting in much exercise.    HTN/HL:  Taking HCTZ and Hydralazine. Not checking BP Limits salt in foods -cardiology increased Lipitor to 80 mg based on increase LDL and Lipoprotein A.  Has follow-up appointment in December..  CKD3:  has appt next mth with nephrology.  GFR has ranged 42-32.  Last creatinine 1.72.  She is not on any NSAIDs.      Patient Active Problem List   Diagnosis Date Noted   Morbid obesity ([place]) 03/09/2022   Seasonal allergies 02/07/2022   Mastitis, right, acute 12/23/2021   Influenza vaccine needed 09/14/2020   Tobacco dependence 05/12/2020   Aortic atherosclerosis ([place])  05/12/2020   Adenomatous polyp of colon 05/12/2020   Langerhan's cell histiocytosis ([place]) 01/26/2018   Cigarette smoker 01/26/2018   Osteoarthritis of cervical spine 12/22/2017   Prediabetes 12/22/2017   Pain of left calf 04/06/2017   Centrilobular emphysema ([place]) 02/23/2016   Obesity (BMI 30-39.9) 01/15/2016   Bilateral shoulder pain 01/15/2016   Esophageal reflux 01/15/2016   Tobacco abuse 05/13/2015   Lung nodule 08/14/2014   Impaired fasting glucose 08/14/2014   Hyperlipidemia 08/14/2014   Personal history of colonic adenomas 06/25/2013   HTN (hypertension) 05/20/2013     Current Outpatient Medications on File Prior to Visit  Medication Sig Dispense Refill   acetaminophen (TYLENOL 8 HOUR) 650 MG CR tablet Take 1 tablet (650 mg total) by mouth 2 (two) times daily as needed for pain. 60 tablet 5   albuterol (PROAIR HFA) 108 (90 Base) MCG/ACT inhaler Inhale 2 puffs into the lungs every 4 (four) hours as needed for wheezing or shortness of breath. 8.5 g 2   albuterol (PROVENTIL) (2.5 MG/3ML) 0.083% nebulizer solution TAKE 3 MLS BY NEBULIZATION EVERY 6 (SIX) HOURS AS NEEDED FOR WHEEZING OR SHORTNESS OF BREATH. (Patient taking differently: Take 2.5 mg by nebulization every 6 (six) hours as needed for wheezing or shortness of breath.) 90 mL 1   aspirin EC 81 MG tablet Take 1 tablet (81 mg total) by mouth daily. 100 tablet 1   atorvastatin (LIPITOR) 80 MG tablet Take  1 tablet (80 mg total) by mouth daily. 90 tablet 3   cetirizine (ZYRTEC) 10 MG tablet Take 1 tablet (10 mg total) by mouth daily. 30 tablet 11   diphenhydrAMINE (BENADRYL) 25 mg capsule Take 25-50 mg by mouth every 6 (six) hours as needed for itching or allergies.     Elastic Bandages & Supports (MEDICAL COMPRESSION STOCKINGS) MISC R60.0 (Patient not taking: Reported on 04/29/2022) 1 each 0   EPINEPHrine 0.3 mg/0.3 mL IJ SOAJ injection Inject 0.3 mg into the muscle as needed for anaphylaxis. 1 each 2   fluticasone  furoate-vilanterol (BREO ELLIPTA) 200-25 MCG/ACT AEPB Inhale 1 puff into the lungs once daily. 60 each 5   furosemide (LASIX) 20 MG tablet Take 1 tablet (20 mg total) by mouth daily. 90 tablet 3   hydrALAZINE (APRESOLINE) 10 MG tablet Take 1 tablet (10 mg total) by mouth 2 (two) times daily. 60 tablet 5   hydrochlorothiazide (HYDRODIURIL) 25 MG tablet Take 1 tablet (25 mg total) by mouth daily. 90 tablet 1   hydrOXYzine (ATARAX) 25 MG tablet Take 1 tablet (25 mg total) by mouth every 8 (eight) hours as needed for up to 15 doses for itching. 15 tablet 0   Olopatadine HCl 0.2 % SOLN Apply 1 drop to affected eyes daily as directed. (Patient taking differently: Place 1 drop into both eyes daily as needed (allergies).) 2.5 mL 1   omeprazole (PRILOSEC) 20 MG capsule Take 1 capsule (20 mg total) by mouth daily. 30 capsule 3   Respiratory Therapy Supplies (FLUTTER) DEVI Use as directed 1 each 0   sodium chloride (OCEAN) 0.65 % nasal spray Place 2 sprays into both nostrils every 4 (four) hours. (Patient taking differently: Place 2 sprays into the nose every 4 (four) hours as needed for congestion.) 44 mL 0   Tiotropium Bromide Monohydrate (SPIRIVA RESPIMAT) 2.5 MCG/ACT AERS Inhale 2 puffs into the lungs daily. 4 g 5   No current facility-administered medications on file prior to visit.    Allergies  Allergen Reactions   Sulfa Antibiotics Anaphylaxis and Hives   Chantix [Varenicline Tartrate]     Bad dreams   Fish Allergy Other (See Comments)    Severe stomach cramps    Hygroton [Chlorthalidone]    Penicillins Other (See Comments)    Has patient had a PCN reaction causing immediate rash, facial/tongue/throat swelling, SOB or lightheadedness with hypotension: No Has patient had a PCN reaction causing severe rash involving mucus membranes or skin necrosis: No Has patient had a PCN reaction that required hospitalization No Has patient had a PCN reaction occurring within the last 10 years: No If all of  the above answers are "NO", then may proceed with Cephalosporin use.  Welts    Latex Rash and Other (See Comments)    Dry skin    Social History   Socioeconomic History   Marital status: Married    Spouse name: Not on file   Number of children: Not on file   Years of education: 12+   Highest education level: Not on file  Occupational History   Occupation: PCA    Employer: [employer]  Tobacco Use   Smoking status: Former    Packs/day: 0.50    Years: 40.00    Total pack years: 20.00    Types: Cigarettes    Quit date: 07/2021    Years since quitting: 1.0   Smokeless tobacco: Never   Tobacco comments:    Quit [id number]  Vaping Use  Vaping Use: Every day  Substance and Sexual Activity   Alcohol use: Yes    Comment: occasional   Drug use: No   Sexual activity: Not Currently  Other Topics Concern   Not on file  Social History Narrative   Regular exercise-no Caffeine Use-   Social Determinants of Health   Financial Resource Strain: Not on file  Food Insecurity: Not on file  Transportation Needs: Not on file  Physical Activity: Not on file  Stress: Not on file  Social Connections: Not on file  Intimate Partner Violence: Not on file    Family History  Problem Relation Age of Onset   Cancer Father        prostate   Hypertension Mother    Diabetes Mother    Hypertension Sister    Heart disease Maternal Uncle    Stroke Maternal Grandmother    Hypertension Maternal Grandmother    Hypertension Sister    Colon cancer Neg Hx    Stomach cancer Neg Hx    Rectal cancer Neg Hx    Colon polyps Neg Hx     Past Surgical History:  Procedure Laterality Date   ABDOMINAL HYSTERECTOMY  20yr ago   due to heavy bleeding and fibroids    COLONOSCOPY     HEMORRHOID BANDING  2014   HEMORRHOID SURGERY  [id number]   UMBILICAL HERNIA REPAIR  06/2015    ROS: Review of Systems Negative except as stated above  PHYSICAL EXAM: BP 128/82 (BP Location: Left Arm, Patient Position:  Sitting, Cuff Size: Large)   Pulse 71   Temp 97.8 F (36.6 C) (Oral)   Ht '5\' 7"'$  (1.702 m)   Wt 269 lb (122 kg)   SpO2 98%   BMI 42.13 kg/m   Wt Readings from Last 3 Encounters:  08/16/22 269 lb (122 kg)  08/10/22 265 lb (120.2 kg)  05/13/22 265 lb (120.2 kg)    Physical Exam  General appearance - alert, well appearing, older African-American female and in no distress.  She has mild audible hoarseness. Mental status - normal mood, behavior, speech, dress, motor activity, and thought processes Nose - normal and patent, no erythema, discharge or polyps Mouth - mucous membranes moist, pharynx normal without lesions Neck - supple, no significant adenopathy Chest - clear to auscultation, no wheezes, rales or rhonchi, symmetric air entry Heart - normal rate, regular rhythm, normal S1, S2, no murmurs, rubs, clicks or gallops Extremities - peripheral pulses normal, no pedal edema, no clubbing or cyanosis      Latest Ref Rng & Units 05/09/2022   10:00 AM 03/17/2022   10:01 AM 03/08/2022   11:32 AM  CMP  Glucose 70 - 99 mg/dL  103  95   BUN 8 - 27 mg/dL  30  17   Creatinine 0.57 - 1.00 mg/dL  1.72  1.38   Sodium 134 - 144 mmol/L  138  145   Potassium 3.5 - 5.2 mmol/L  4.0  4.5   Chloride 96 - 106 mmol/L  101  108   CO2 20 - 29 mmol/L  25  22   Calcium 8.7 - 10.3 mg/dL  9.2  9.0   Total Protein 6.0 - 8.5 g/dL 7.7     Total Bilirubin 0.0 - 1.2 mg/dL 0.3     Alkaline Phos 44 - 121 IU/L 153     AST 0 - 40 IU/L 16     ALT 0 - 32 IU/L 18  Lipid Panel     Component Value Date/Time   CHOL 198 05/09/2022 1000   TRIG 115 05/09/2022 1000   HDL 47 05/09/2022 1000   CHOLHDL 4.2 05/09/2022 1000   CHOLHDL 3.7 08/17/2021 0340   VLDL 12 08/17/2021 0340   LDLCALC 130 (H) 05/09/2022 1000   LDLDIRECT 190.1 04/16/2013 1531    CBC    Component Value Date/Time   WBC 7.3 02/01/2022 1559   RBC 5.45 (H) 02/01/2022 1559   HGB 15.8 (H) 02/01/2022 1559   HGB 14.1 01/27/2021 1009   HCT 48.4  (H) 02/01/2022 1559   HCT 42.5 01/27/2021 1009   PLT 238 02/01/2022 1559   PLT 261 01/27/2021 1009   MCV 88.8 02/01/2022 1559   MCV 89 01/27/2021 1009   MCH 29.0 02/01/2022 1559   MCHC 32.6 02/01/2022 1559   RDW 17.0 (H) 02/01/2022 1559   RDW 15.4 01/27/2021 1009   LYMPHSABS 1.3 02/01/2022 1559   LYMPHSABS 2.2 02/10/2020 1000   MONOABS 0.3 02/01/2022 1559   EOSABS 0.0 02/01/2022 1559   EOSABS 0.2 02/10/2020 1000   BASOSABS 0.0 02/01/2022 1559   BASOSABS 0.1 02/10/2020 1000    ASSESSMENT AND PLAN:  1. URI, acute Patient declines COVID test today. Recommend over-the-counter cough syrup like Robitussin or Coricidin HBP.  Given that she has had increased use of her rescue inhaler, we will give a short course of prednisone. - predniSONE (DELTASONE) 10 MG tablet; Take 1 tablet (10 mg total) by mouth daily with breakfast.  Dispense: 4 tablet; Refill: 0  2. Chronic obstructive pulmonary disease, unspecified COPD type ([place]) See #1 above.  She will continue Breo and Spiriva inhalers - predniSONE (DELTASONE) 10 MG tablet; Take 1 tablet (10 mg total) by mouth daily with breakfast.  Dispense: 4 tablet; Refill: 0  3. Hives We will refer her to allergy and immunology for further evaluation given that this is her third episode in the past year.  She has EpiPen that was prescribed from the ER.  Advised to always keep 1 on her. - Ambulatory referral to Allergy  4. Essential hypertension Close to goal.  Continue HCTZ and hydralazine.  5. Morbid obesity ([place]) Encouraged her to try to move more.  I think the phentermine would work better if she tries to increase activity level.  She promises to try to do so.  We will also increase the dose of the phentermine to 37.5 mg to see if she has a better response. - phentermine 37.5 MG capsule; Take 1 capsule (37.5 mg total) by mouth every morning.  Dispense: 30 capsule; Refill: 1  6. Stage 3b chronic kidney disease ([place]) Continue to avoid NSAIDs.  Keep  upcoming appointment with nephrology next month. - Comprehensive metabolic panel  7. Hyperlipidemia, unspecified hyperlipidemia type Continue atorvastatin.  We will check lipid profile and chemistry today given that dose of atorvastatin was increased several weeks ago. - Lipid panel - Comprehensive metabolic panel  8. Need for influenza vaccination - Flu Vaccine QUAD High Dose(Fluad)  9. Need for RSV vaccination - RSV vaccine recomb adjuvanted (AREXVY) 120 MCG/0.5ML injection; Inject 0.5 mLs into the muscle once for 1 dose.  Dispense: 1 each; Refill: 0    Patient was given the opportunity to ask questions.  Patient verbalized understanding of the plan and was able to repeat key elements of the plan.   This documentation was completed using Radio producer.  Any transcriptional errors are unintentional.  Orders Placed This Encounter  Procedures   Flu Vaccine QUAD High Dose(Fluad)   Lipid panel   Comprehensive metabolic panel   Ambulatory referral to Allergy     Requested Prescriptions   Signed Prescriptions Disp Refills   phentermine 37.5 MG capsule 30 capsule 1    Sig: Take 1 capsule (37.5 mg total) by mouth every morning.   predniSONE (DELTASONE) 10 MG tablet 4 tablet 0    Sig: Take 1 tablet (10 mg total) by mouth daily with breakfast.   RSV vaccine recomb adjuvanted (AREXVY) 120 MCG/0.5ML injection 1 each 0    Sig: Inject 0.5 mLs into the muscle once for 1 dose.    Return in about 2 months (around 10/16/2022) for Give telephone appt with Alycia/Carly for Medicare Wellness visit .  Karle Plumber, MD, FACP

## 2022-08-17 LAB — COMPREHENSIVE METABOLIC PANEL
ALT: 28 IU/L (ref 0–32)
AST: 16 IU/L (ref 0–40)
Albumin/Globulin Ratio: 1.1 — ABNORMAL LOW (ref 1.2–2.2)
Albumin: 3.8 g/dL — ABNORMAL LOW (ref 3.9–4.9)
Alkaline Phosphatase: 149 IU/L — ABNORMAL HIGH (ref 44–121)
BUN/Creatinine Ratio: 17 (ref 12–28)
BUN: 26 mg/dL (ref 8–27)
Bilirubin Total: 0.2 mg/dL (ref 0.0–1.2)
CO2: 28 mmol/L (ref 20–29)
Calcium: 9 mg/dL (ref 8.7–10.3)
Chloride: 101 mmol/L (ref 96–106)
Creatinine, Ser: 1.57 mg/dL — ABNORMAL HIGH (ref 0.57–1.00)
Globulin, Total: 3.5 g/dL (ref 1.5–4.5)
Glucose: 97 mg/dL (ref 70–99)
Potassium: 4 mmol/L (ref 3.5–5.2)
Sodium: 145 mmol/L — ABNORMAL HIGH (ref 134–144)
Total Protein: 7.3 g/dL (ref 6.0–8.5)
eGFR: 36 mL/min/{1.73_m2} — ABNORMAL LOW (ref >59)

## 2022-08-17 LAB — LIPID PANEL
Chol/HDL Ratio: 3.4 ratio (ref 0.0–4.4)
Cholesterol, Total: 185 mg/dL (ref 100–199)
HDL: 55 mg/dL (ref >39)
LDL Chol Calc (NIH): 107 mg/dL — ABNORMAL HIGH (ref 0–99)
Triglycerides: 132 mg/dL (ref 0–149)
VLDL Cholesterol Cal: 23 mg/dL (ref 5–40)

## 2022-08-18 NOTE — Telephone Encounter (Signed)
Order and office notes has been faxed.

## 2022-08-19 ENCOUNTER — Ambulatory Visit: Payer: Medicare Other | Attending: Internal Medicine | Admitting: Internal Medicine

## 2022-08-19 DIAGNOSIS — Z0000 Encounter for general adult medical examination without abnormal findings: Secondary | ICD-10-CM | POA: Diagnosis not present

## 2022-08-19 NOTE — Patient Instructions (Signed)
Krystal Allen , Thank you for taking time to come for your Medicare Wellness Visit. I appreciate your ongoing commitment to your health goals. Please review the following plan we discussed and let me know if I can assist you in the future.   Referral for a bone density scan will be placed by Dr. Laural Benes. Also consider getting up to date on vaccines.    These are the goals we discussed:  Goals      Blood Pressure < 140/90     Exercise 3x per week (30 min per time)     Quit smoking / using tobacco        This is a list of the screening recommended for you and due dates:  Health Maintenance  Topic Date Due   Medicare Annual Wellness Visit  Never done   Zoster (Shingles) Vaccine (1 of 2) Never done   Screening for Lung Cancer  Never done   DEXA scan (bone density measurement)  Never done   COVID-19 Vaccine (4 - Pfizer risk series) 03/22/2021   Colon Cancer Screening  07/15/2023   Mammogram  02/02/2024   Tetanus Vaccine  08/14/2024   Pneumonia Vaccine  Completed   Flu Shot  Completed   Hepatitis C Screening: USPSTF Recommendation to screen - Ages 18-79 yo.  Completed   HPV Vaccine  Aged Out        Health Maintenance, Female Adopting a healthy lifestyle and getting preventive care are important in promoting health and wellness. Ask your health care provider about: The right schedule for you to have regular tests and exams. Things you can do on your own to prevent diseases and keep yourself healthy. What should I know about diet, weight, and exercise? Eat a healthy diet  Eat a diet that includes plenty of vegetables, fruits, low-fat dairy products, and lean protein. Do not eat a lot of foods that are high in solid fats, added sugars, or sodium. Maintain a healthy weight Body mass index (BMI) is used to identify weight problems. It estimates body fat based on height and weight. Your health care provider can help determine your BMI and help you achieve or maintain a healthy  weight. Get regular exercise Get regular exercise. This is one of the most important things you can do for your health. Most adults should: Exercise for at least 150 minutes each week. The exercise should increase your heart rate and make you sweat (moderate-intensity exercise). Do strengthening exercises at least twice a week. This is in addition to the moderate-intensity exercise. Spend less time sitting. Even light physical activity can be beneficial. Watch cholesterol and blood lipids Have your blood tested for lipids and cholesterol at 66 years of age, then have this test every 5 years. Have your cholesterol levels checked more often if: Your lipid or cholesterol levels are high. You are older than 66 years of age. You are at high risk for heart disease. What should I know about cancer screening? Depending on your health history and family history, you may need to have cancer screening at various ages. This may include screening for: Breast cancer. Cervical cancer. Colorectal cancer. Skin cancer. Lung cancer. What should I know about heart disease, diabetes, and high blood pressure? Blood pressure and heart disease High blood pressure causes heart disease and increases the risk of stroke. This is more likely to develop in people who have high blood pressure readings or are overweight. Have your blood pressure checked: Every 3-5 years if you  are 53-52 years of age. Every year if you are 23 years old or older. Diabetes Have regular diabetes screenings. This checks your fasting blood sugar level. Have the screening done: Once every three years after age 85 if you are at a normal weight and have a low risk for diabetes. More often and at a younger age if you are overweight or have a high risk for diabetes. What should I know about preventing infection? Hepatitis B If you have a higher risk for hepatitis B, you should be screened for this virus. Talk with your health care provider to  find out if you are at risk for hepatitis B infection. Hepatitis C Testing is recommended for: Everyone born from [birth year] through 1965. Anyone with known risk factors for hepatitis C. Sexually transmitted infections (STIs) Get screened for STIs, including gonorrhea and chlamydia, if: You are sexually active and are younger than 66 years of age. You are older than 66 years of age and your health care provider tells you that you are at risk for this type of infection. Your sexual activity has changed since you were last screened, and you are at increased risk for chlamydia or gonorrhea. Ask your health care provider if you are at risk. Ask your health care provider about whether you are at high risk for HIV. Your health care provider may recommend a prescription medicine to help prevent HIV infection. If you choose to take medicine to prevent HIV, you should first get tested for HIV. You should then be tested every 3 months for as long as you are taking the medicine. Pregnancy If you are about to stop having your period (premenopausal) and you may become pregnant, seek counseling before you get pregnant. Take 400 to 800 micrograms (mcg) of folic acid every day if you become pregnant. Ask for birth control (contraception) if you want to prevent pregnancy. Osteoporosis and menopause Osteoporosis is a disease in which the bones lose minerals and strength with aging. This can result in bone fractures. If you are 76 years old or older, or if you are at risk for osteoporosis and fractures, ask your health care provider if you should: Be screened for bone loss. Take a calcium or vitamin D supplement to lower your risk of fractures. Be given hormone replacement therapy (HRT) to treat symptoms of menopause. Follow these instructions at home: Alcohol use Do not drink alcohol if: Your health care provider tells you not to drink. You are pregnant, may be pregnant, or are planning to become pregnant. If you  drink alcohol: Limit how much you have to: 0-1 drink a day. Know how much alcohol is in your drink. In the [place] one drink equals one 12 oz bottle of beer (355 mL), one 5 oz glass of wine (148 mL), or one 1 oz glass of hard liquor (44 mL). Lifestyle Do not use any products that contain nicotine or tobacco. These products include cigarettes, chewing tobacco, and vaping devices, such as e-cigarettes. If you need help quitting, ask your health care provider. Do not use street drugs. Do not share needles. Ask your health care provider for help if you need support or information about quitting drugs. General instructions Schedule regular health, dental, and eye exams. Stay current with your vaccines. Tell your health care provider if: You often feel depressed. You have ever been abused or do not feel safe at home. Summary Adopting a healthy lifestyle and getting preventive care are important in promoting health and wellness. Follow  your health care provider's instructions about healthy diet, exercising, and getting tested or screened for diseases. Follow your health care provider's instructions on monitoring your cholesterol and blood pressure. This information is not intended to replace advice given to you by your health care provider. Make sure you discuss any questions you have with your health care provider. Document Revised: 03/08/2021 Document Reviewed: 03/08/2021 Elsevier Patient Education  2023 [employer].

## 2022-08-19 NOTE — Progress Notes (Signed)
Subjective:   Krystal Allen is a 66 y.o. female who presents for Medicare Annual (Subsequent) preventive examination.  I connected with  Krystal Allen on 08/22/22 by a audio enabled telemedicine application and verified that I am speaking with the correct person using two identifiers.  Patient Location: Home  Provider Location: Office/Clinic  I discussed the limitations of evaluation and management by telemedicine. The patient expressed understanding and agreed to proceed.      Objective:    There were no vitals filed for this visit. There is no height or weight on file to calculate BMI.     08/19/2022   10:39 AM 08/10/2022    8:30 AM 05/13/2022   11:02 AM 08/17/2021    4:02 PM 04/14/2019   12:08 PM 04/14/2019    1:25 AM 04/14/2019   12:24 AM  Advanced Directives  Does Patient Have a Medical Advance Directive? No No No No No No No  Would patient like information on creating a medical advance directive?  No - Patient declined No - Patient declined No - Patient declined No - Patient declined No - Patient declined     Current Medications (verified) Outpatient Encounter Medications as of 08/19/2022  Medication Sig   acetaminophen (TYLENOL 8 HOUR) 650 MG CR tablet Take 1 tablet (650 mg total) by mouth 2 (two) times daily as needed for pain.   albuterol (PROAIR HFA) 108 (90 Base) MCG/ACT inhaler Inhale 2 puffs into the lungs every 4 (four) hours as needed for wheezing or shortness of breath.   albuterol (PROVENTIL) (2.5 MG/3ML) 0.083% nebulizer solution TAKE 3 MLS BY NEBULIZATION EVERY 6 (SIX) HOURS AS NEEDED FOR WHEEZING OR SHORTNESS OF BREATH. (Patient taking differently: Take 2.5 mg by nebulization every 6 (six) hours as needed for wheezing or shortness of breath.)   aspirin EC 81 MG tablet Take 1 tablet (81 mg total) by mouth daily.   atorvastatin (LIPITOR) 80 MG tablet Take 1 tablet (80 mg total) by mouth daily.   cetirizine (ZYRTEC) 10 MG tablet Take 1 tablet (10 mg total)  by mouth daily.   diphenhydrAMINE (BENADRYL) 25 mg capsule Take 25-50 mg by mouth every 6 (six) hours as needed for itching or allergies.   Elastic Bandages & Supports (MEDICAL COMPRESSION STOCKINGS) MISC R60.0   EPINEPHrine 0.3 mg/0.3 mL IJ SOAJ injection Inject 0.3 mg into the muscle as needed for anaphylaxis.   fluticasone furoate-vilanterol (BREO ELLIPTA) 200-25 MCG/ACT AEPB Inhale 1 puff into the lungs once daily.   furosemide (LASIX) 20 MG tablet Take 1 tablet (20 mg total) by mouth daily.   hydrALAZINE (APRESOLINE) 10 MG tablet Take 1 tablet (10 mg total) by mouth 2 (two) times daily.   hydrochlorothiazide (HYDRODIURIL) 25 MG tablet Take 1 tablet (25 mg total) by mouth daily.   hydrOXYzine (ATARAX) 25 MG tablet Take 1 tablet (25 mg total) by mouth every 8 (eight) hours as needed for up to 15 doses for itching.   Olopatadine HCl 0.2 % SOLN Apply 1 drop to affected eyes daily as directed. (Patient taking differently: Place 1 drop into both eyes daily as needed (allergies).)   omeprazole (PRILOSEC) 20 MG capsule Take 1 capsule (20 mg total) by mouth daily.   phentermine 37.5 MG capsule Take 1 capsule (37.5 mg total) by mouth every morning.   Respiratory Therapy Supplies (FLUTTER) DEVI Use as directed   sodium chloride (OCEAN) 0.65 % nasal spray Place 2 sprays into both nostrils every 4 (four) hours. (Patient  taking differently: Place 2 sprays into the nose every 4 (four) hours as needed for congestion.)   Tiotropium Bromide Monohydrate (SPIRIVA RESPIMAT) 2.5 MCG/ACT AERS Inhale 2 puffs into the lungs daily.   [DISCONTINUED] predniSONE (DELTASONE) 10 MG tablet Take 1 tablet (10 mg total) by mouth daily with breakfast.   No facility-administered encounter medications on file as of 08/19/2022.    Allergies (verified) Sulfa antibiotics, Chantix [varenicline tartrate], Fish allergy, Hygroton [chlorthalidone], Penicillins, and Latex   History: Past Medical History:  Diagnosis Date   Allergy     COPD (chronic obstructive pulmonary disease) (HCC)    Depression    GERD (gastroesophageal reflux disease)    Hernia, abdominal    History of chicken pox    Hyperlipidemia    Hypertension    Internal hemorrhoids with Grade 3 prolapse and bleeding 06/19/2013   Osteoarthritis    Personal history of colonic adenomas 06/25/2013   Past Surgical History:  Procedure Laterality Date   ABDOMINAL HYSTERECTOMY  27yr ago   due to heavy bleeding and fibroids    COLONOSCOPY     HEMORRHOID BANDING  2014   HEMORRHOID SURGERY  [id number]   UMBILICAL HERNIA REPAIR  06/2015   Family History  Problem Relation Age of Onset   Cancer Father        prostate   Hypertension Mother    Diabetes Mother    Hypertension Sister    Heart disease Maternal Uncle    Stroke Maternal Grandmother    Hypertension Maternal Grandmother    Hypertension Sister    Colon cancer Neg Hx    Stomach cancer Neg Hx    Rectal cancer Neg Hx    Colon polyps Neg Hx    Social History   Socioeconomic History   Marital status: Married    Spouse name: Not on file   Number of children: Not on file   Years of education: 12+   Highest education level: Not on file  Occupational History   Occupation: PCA    Employer: [employer] Tobacco Use   Smoking status: Former    Packs/day: 0.50    Years: 40.00    Total pack years: 20.00    Types: Cigarettes    Quit date: 07/2021    Years since quitting: 1.0   Smokeless tobacco: Never   Tobacco comments:    Quit 102022  Vaping Use   Vaping Use: Every day  Substance and Sexual Activity   Alcohol use: Yes    Comment: occasional   Drug use: No   Sexual activity: Not Currently  Other Topics Concern   Not on file  Social History Narrative   Regular exercise-no Caffeine Use-   Social Determinants of Health   Financial Resource Strain: Low Risk  (08/19/2022)   Overall Financial Resource Strain (CARDIA)    Difficulty of Paying Living Expenses: Not hard at all  Food  Insecurity: No Food Insecurity (08/19/2022)   Hunger Vital Sign    Worried About Running Out of Food in the Last Year: Never true    [place] the Last Year: Never true  Transportation Needs: No Transportation Needs (08/19/2022)   PRAPARE - THydrologist(Medical): No    Lack of Transportation (Non-Medical): No  Physical Activity: Inactive (08/19/2022)   Exercise Vital Sign    Days of Exercise per Week: 1 day    Minutes of Exercise per Session: 0 min  Stress: No Stress  Concern Present (08/19/2022)   [place]    Feeling of Stress : Not at all  Social Connections: Socially Isolated (08/19/2022)   Social Connection and Isolation Panel [NHANES]    Frequency of Communication with Friends and Family: More than three times a week    Frequency of Social Gatherings with Friends and Family: Three times a week    Attends Religious Services: Never    Active Member of Clubs or Organizations: No    Attends Archivist Meetings: Never    Marital Status: Separated    Tobacco Counseling Counseling given: Not Answered Tobacco comments: Quit [id number]   Clinical Intake:                 Diabetic?No         Activities of Daily Living    08/19/2022   10:30 AM  In your present state of health, do you have any difficulty performing the following activities:  Hearing? 0  Vision? 0  Difficulty concentrating or making decisions? 0  Walking or climbing stairs? 0  Dressing or bathing? 0  Doing errands, shopping? 0  Preparing Food and eating ? N  Using the Toilet? N  In the past six months, have you accidently leaked urine? N  Do you have problems with loss of bowel control? N  Managing your Medications? N  Managing your Finances? N  Housekeeping or managing your Housekeeping? N    Patient Care Team: Ladell Pier, MD as PCP - General (Internal  Medicine) [name], MD as PCP - Cardiology (Cardiology)  Indicate any recent Medical Services you may have received from other than [facility] providers in the past year (date may be approximate).     Assessment:   This is a routine wellness examination for [place].  Hearing/Vision screen No results found.  Dietary issues and exercise activities discussed: Current Exercise Habits: The patient does not participate in regular exercise at present   Goals Addressed   None   Depression Screen    08/19/2022   10:30 AM 08/16/2022   11:15 AM 05/13/2022   11:02 AM 04/29/2022    9:09 AM 03/08/2022   10:01 AM 02/07/2022   10:01 AM 12/23/2021    3:28 PM  PHQ 2/9 Scores  PHQ - 2 Score 0 0 0 0 0 0 0  PHQ- 9 Score  0  0 0 0 2    Fall Risk    08/19/2022   10:30 AM 08/16/2022    9:59 AM 05/13/2022   11:02 AM 04/29/2022    9:09 AM 02/07/2022   10:01 AM  Fall Risk   Falls in the past year? 0 0 0 0 0  Number falls in past yr: 0 0  0 0  Injury with Fall? 0 0  0 0  Risk for fall due to : No Fall Risks No Fall Risks  No Fall Risks   Follow up Falls evaluation completed    Falls evaluation completed    FALL RISK PREVENTION PERTAINING TO THE HOME:  Any stairs in or around the home? No  If so, are there any without handrails? No  Home free of loose throw rugs in walkways, pet beds, electrical cords, etc? No  Adequate lighting in your home to reduce risk of falls? Yes   ASSISTIVE DEVICES UTILIZED TO PREVENT FALLS:  Life alert? No  Use of a cane, walker or w/c? No  Grab bars in  the bathroom? No  Shower chair or bench in shower? No  Elevated toilet seat or a handicapped toilet? No   TIMED UP AND GO: no performed.   Cognitive Function:        08/19/2022   10:39 AM  6CIT Screen  What Year? 0 points  What month? 0 points  What time? 0 points  Count back from 20 0 points  Months in reverse 4 points  Repeat phrase 2 points  Total Score 6 points    Immunizations Immunization  History  Administered Date(s) Administered   Fluad Quad(high Dose 65+) 08/16/2022   Influenza Split 07/10/2017   Influenza,inj,Quad PF,6+ Mos 08/14/2014, 09/14/2020, 09/13/2021   PFIZER Comirnaty(Gray Top)Covid-19 Tri-Sucrose Vaccine 01/25/2021   PFIZER(Purple Top)SARS-COV-2 Vaccination 05/12/2020, 06/02/2020   PNEUMOCOCCAL CONJUGATE-20 08/24/2021   Respiratory Syncytial Virus Vaccine,Recomb Aduvanted(Arexvy) 08/16/2022   Tdap 08/14/2014    TDAP status: Up to date  Flu Vaccine status: Up to date  Pneumococcal vaccine status: Up to date  Covid-19 vaccine status: Completed vaccines  Qualifies for Shingles Vaccine? Yes   Zostavax completed No   Shingrix Completed?: No.    Education has been provided regarding the importance of this vaccine. Patient has been advised to call insurance company to determine out of pocket expense if they have not yet received this vaccine. Advised may also receive vaccine at local pharmacy or Health Dept. Verbalized acceptance and understanding.  Screening Tests Health Maintenance  Topic Date Due   Zoster Vaccines- Shingrix (1 of 2) Never done   DEXA SCAN  Never done   COVID-19 Vaccine (4 - Pfizer risk series) 03/22/2021   COLONOSCOPY (Pts 45-30yr Insurance coverage will need to be confirmed)  07/15/2023   MAMMOGRAM  02/02/2024   TETANUS/TDAP  08/14/2024   Pneumonia Vaccine 66 Years old  Completed   INFLUENZA VACCINE  Completed   Hepatitis C Screening  Completed   HPV VACCINES  Aged Out    Health Maintenance  Health Maintenance Due  Topic Date Due   Zoster Vaccines- Shingrix (1 of 2) Never done   DEXA SCAN  Never done   COVID-19 Vaccine (4 - Pfizer risk series) 03/22/2021    Colorectal cancer screening: Type of screening: Colonoscopy. Completed [id number] Repeat every 07/15/2023 years  Mammogram status: Completed 07/14/2020. Repeat every year  Bone Density status: Ordered PCP. Pt provided with contact info and advised to call to schedule  appt.  Lung Cancer Screening: (Low Dose CT Chest recommended if Age 66-80years, 30 pack-year currently smoking OR have quit w/in 15years.) does qualify.   Lung Cancer Screening Referral: not needed. Completed 02/02/2022  Additional Screening:  Hepatitis C Screening: does not qualify; Completed 01/15/2016  Vision Screening: Recommended annual ophthalmology exams for early detection of glaucoma and other disorders of the eye. Is the patient up to date with their annual eye exam?  Yes  Who is the provider or what is the name of the office in which the patient attends annual eye exams? Patient could not remember If pt is not established with a provider, would they like to be referred to a provider to establish care? Yes .   Dental Screening: Recommended annual dental exams for proper oral hygiene  Community Resource Referral / Chronic Care Management: CRR required this visit?  No   CCM required this visit?  No      Plan:     I have personally reviewed and noted the following in the patient's chart:   Medical and social history  Use of alcohol, tobacco or illicit drugs  Current medications and supplements including opioid prescriptions. Patient is not currently taking opioid prescriptions. Functional ability and status Nutritional status Physical activity Advanced directives List of other physicians Hospitalizations, surgeries, and ER visits in previous 12 months Vitals Screenings to include cognitive, depression, and falls Referrals and appointments  In addition, I have reviewed and discussed with patient certain preventive protocols, quality metrics, and best practice recommendations. A written personalized care plan for preventive services as well as general preventive health recommendations were provided to patient.     Carilyn Goodpasture, RN   08/22/2022   Nurse Notes: Non-face-to-face 15 minutes   Ms. Hakeem , Thank you for taking time to come for your Medicare Wellness  Visit. I appreciate your ongoing commitment to your health goals. Please review the following plan we discussed and let me know if I can assist you in the future.   These are the goals we discussed:  Goals      Blood Pressure < 140/90     Exercise 3x per week (30 min per time)     Quit smoking / using tobacco        This is a list of the screening recommended for you and due dates:  Health Maintenance  Topic Date Due   Zoster (Shingles) Vaccine (1 of 2) Never done   DEXA scan (bone density measurement)  Never done   COVID-19 Vaccine (4 - Pfizer risk series) 03/22/2021   Colon Cancer Screening  07/15/2023   Mammogram  02/02/2024   Tetanus Vaccine  08/14/2024   Pneumonia Vaccine  Completed   Flu Shot  Completed   Hepatitis C Screening: USPSTF Recommendation to screen - Ages 18-79 yo.  Completed   HPV Vaccine  Aged Out

## 2022-08-22 ENCOUNTER — Other Ambulatory Visit (HOSPITAL_COMMUNITY): Payer: Self-pay

## 2022-08-23 DIAGNOSIS — R0602 Shortness of breath: Secondary | ICD-10-CM | POA: Diagnosis not present

## 2022-08-24 ENCOUNTER — Encounter: Payer: Self-pay | Admitting: Internal Medicine

## 2022-08-27 ENCOUNTER — Other Ambulatory Visit: Payer: Self-pay | Admitting: Internal Medicine

## 2022-08-27 DIAGNOSIS — Z780 Asymptomatic menopausal state: Secondary | ICD-10-CM

## 2022-09-08 ENCOUNTER — Telehealth: Payer: Self-pay | Admitting: Emergency Medicine

## 2022-09-08 NOTE — Telephone Encounter (Signed)
Copied from [place] [id number]. Topic: General - Other >> Sep 08, 2022  9:17 AM Krystal Allen wrote: Reason for CRM: The patient would like additional information about Tirzepatide/Mounjaro  The patient is interested in speaking with a member of clinical staff about this medication and it's affects   The patient shares that they find the current medication they're prescribed for weight management to be ineffective   The patient is not requesting a new prescription at the moment but would like additional information about this medication   Please contact the patient further when possible

## 2022-09-08 NOTE — Telephone Encounter (Signed)
Patient should discuss this with her PCP at her upcoming office visit.

## 2022-09-17 ENCOUNTER — Other Ambulatory Visit (HOSPITAL_COMMUNITY): Payer: Self-pay

## 2022-09-17 ENCOUNTER — Other Ambulatory Visit: Payer: Self-pay | Admitting: Internal Medicine

## 2022-09-17 DIAGNOSIS — J42 Unspecified chronic bronchitis: Secondary | ICD-10-CM

## 2022-09-20 ENCOUNTER — Other Ambulatory Visit (HOSPITAL_COMMUNITY): Payer: Self-pay

## 2022-09-23 DIAGNOSIS — R0602 Shortness of breath: Secondary | ICD-10-CM | POA: Diagnosis not present

## 2022-09-27 DIAGNOSIS — N1832 Chronic kidney disease, stage 3b: Secondary | ICD-10-CM | POA: Diagnosis not present

## 2022-09-27 DIAGNOSIS — E785 Hyperlipidemia, unspecified: Secondary | ICD-10-CM | POA: Diagnosis not present

## 2022-09-27 DIAGNOSIS — I129 Hypertensive chronic kidney disease with stage 1 through stage 4 chronic kidney disease, or unspecified chronic kidney disease: Secondary | ICD-10-CM | POA: Diagnosis not present

## 2022-09-27 DIAGNOSIS — J449 Chronic obstructive pulmonary disease, unspecified: Secondary | ICD-10-CM | POA: Diagnosis not present

## 2022-09-30 ENCOUNTER — Other Ambulatory Visit: Payer: Self-pay

## 2022-09-30 ENCOUNTER — Ambulatory Visit (INDEPENDENT_AMBULATORY_CARE_PROVIDER_SITE_OTHER): Payer: Medicare Other | Admitting: Internal Medicine

## 2022-09-30 ENCOUNTER — Other Ambulatory Visit: Payer: Self-pay | Admitting: Internal Medicine

## 2022-09-30 ENCOUNTER — Encounter: Payer: Self-pay | Admitting: Internal Medicine

## 2022-09-30 ENCOUNTER — Other Ambulatory Visit (HOSPITAL_COMMUNITY): Payer: Self-pay

## 2022-09-30 VITALS — BP 118/70 | HR 74 | Temp 97.3°F | Resp 16 | Ht 66.0 in | Wt 255.0 lb

## 2022-09-30 DIAGNOSIS — L501 Idiopathic urticaria: Secondary | ICD-10-CM

## 2022-09-30 DIAGNOSIS — K219 Gastro-esophageal reflux disease without esophagitis: Secondary | ICD-10-CM

## 2022-09-30 DIAGNOSIS — J310 Chronic rhinitis: Secondary | ICD-10-CM | POA: Diagnosis not present

## 2022-09-30 DIAGNOSIS — Z91013 Allergy to seafood: Secondary | ICD-10-CM

## 2022-09-30 DIAGNOSIS — J42 Unspecified chronic bronchitis: Secondary | ICD-10-CM

## 2022-09-30 MED ORDER — OMEPRAZOLE 20 MG PO CPDR
20.0000 mg | DELAYED_RELEASE_CAPSULE | Freq: Every day | ORAL | 3 refills | Status: DC
  Filled 2022-09-30 – 2022-11-07 (×2): qty 90, 90d supply, fill #0
  Filled 2022-11-11: qty 90, 90d supply, fill #1
  Filled 2023-05-19 – 2023-05-22 (×2): qty 90, 90d supply, fill #2
  Filled 2023-09-11: qty 90, 90d supply, fill #3

## 2022-09-30 NOTE — Telephone Encounter (Signed)
Requested Prescriptions  Pending Prescriptions Disp Refills   omeprazole (PRILOSEC) 20 MG capsule 90 capsule 3    Sig: Take 1 capsule (20 mg total) by mouth daily.     Gastroenterology: Proton Pump Inhibitors Passed - 09/30/2022  4:46 PM      Passed - Valid encounter within last 12 months    Recent Outpatient Visits           1 month ago Encounter for Medicare annual wellness exam   [place] Ladell Pier, MD   1 month ago URI, acute   [place], MD   5 months ago Essential hypertension   [place], MD   6 months ago Morbid obesity [facility])   [place], MD   6 months ago Encounter for medication review   [place], RPH-CPP       Future Appointments             In 3 days Ali Lowe, Arlie Solomons, MD [place]   In 5 days Hilty, Nadean Corwin, MD [facility], [facility]   In 2 weeks Ladell Pier, MD [place]

## 2022-09-30 NOTE — Progress Notes (Signed)
NEW PATIENT  Date of Service/Encounter:  09/30/22  Consult requested by: Ladell Pier, MD   Subjective:   Krystal Allen (DOB: 1956/03/23) is a 66 y.o. female who presents to the clinic on 09/30/2022 with a chief complaint of Consult and Urticaria (08/09/22 - Patient states she ate Peanut Butter, mango, coconut - see 08/10/22 ED note) .    History obtained from: chart review and patient.   She does have COPD and is on Breo/Spiriva and followed by her PCP.   Rhinitis:  Ongoing for a long time. Symptoms include: nasal congestion, rhinorrhea, post nasal drainage, sneezing, watery eyes, and itchy eyes  Occurs seasonally-Spring Potential triggers: not sure Treatments tried:  Benadryl/Zyrtec PRN; last use was yesterday Previous allergy testing: no History of reflux/heartburn: no History of chronic sinusitis or sinus surgery: no  Hives:  Reports initial onset in 01/2022.  Initially started off with itchy red bumps but then became large hives- red, itchy and raised. No scarring or pain.  No new medications or products.  No illness at the time.  This recurred in 07/2022 with tongue tingling.  Each episode lasted about 3 days.  She was seen in the ER 10/11 at the time and noted to have hives only on exam.  Give Epi, steroids, pepcid and sent home with prednisone, Epipen and hydroxyzine. She was then seen by PCP 10/17; reports eating peanut butter/mango/coconut at dinner the night prior; referred to Allergy.  Since then has eaten these foods without issues.   She does eat red meat but denies any tick bites.   Concern for Food Allergy:  Foods of concern: seafood History of reaction: reports severe stomach cramps. No other systemic symptoms.   Previous allergy testing no Does not carry an Epipen.  Past Medical History: Past Medical History:  Diagnosis Date   Allergy    COPD (chronic obstructive pulmonary disease) (HCC)    Depression    GERD (gastroesophageal reflux disease)     Hernia, abdominal    History of chicken pox    Hyperlipidemia    Hypertension    Internal hemorrhoids with Grade 3 prolapse and bleeding 06/19/2013   Osteoarthritis    Personal history of colonic adenomas 06/25/2013   Past Surgical History: Past Surgical History:  Procedure Laterality Date   ABDOMINAL HYSTERECTOMY  54yr ago   due to heavy bleeding and fibroids    COLONOSCOPY     HEMORRHOID BANDING  2014   HEMORRHOID SURGERY  [id number]   UMBILICAL HERNIA REPAIR  06/2015    Family History: Family History  Problem Relation Age of Onset   Cancer Father        prostate   Hypertension Mother    Diabetes Mother    Hypertension Sister    Heart disease Maternal Uncle    Stroke Maternal Grandmother    Hypertension Maternal Grandmother    Hypertension Sister    Colon cancer Neg Hx    Stomach cancer Neg Hx    Rectal cancer Neg Hx    Colon polyps Neg Hx     Social History:  Lives in a unknown year house Flooring in bedroom: cCharity fundraiserPets: none Tobacco use/exposure: none Job: none  Medication List:  Allergies as of 09/30/2022       Reactions   Sulfa Antibiotics Anaphylaxis, Hives   Chantix [varenicline Tartrate]    Bad dreams   Fish Allergy Other (See Comments)   Severe stomach cramps    Hygroton [chlorthalidone]  Penicillins Other (See Comments)   Has patient had a PCN reaction causing immediate rash, facial/tongue/throat swelling, SOB or lightheadedness with hypotension: No Has patient had a PCN reaction causing severe rash involving mucus membranes or skin necrosis: No Has patient had a PCN reaction that required hospitalization No Has patient had a PCN reaction occurring within the last 10 years: No If all of the above answers are "NO", then may proceed with Cephalosporin use. Welts    Latex Rash, Other (See Comments)   Dry skin        Medication List        Accurate as of September 30, 2022  1:23 PM. If you have any questions, ask your nurse or doctor.           acetaminophen 650 MG CR tablet Commonly known as: Tylenol 8 Hour Take 1 tablet (650 mg total) by mouth 2 (two) times daily as needed for pain.   albuterol (2.5 MG/3ML) 0.083% nebulizer solution Commonly known as: PROVENTIL TAKE 3 MLS BY NEBULIZATION EVERY 6 (SIX) HOURS AS NEEDED FOR WHEEZING OR SHORTNESS OF BREATH. What changed:  how much to take how to take this when to take this reasons to take this additional instructions   albuterol 108 (90 Base) MCG/ACT inhaler Commonly known as: ProAir HFA Inhale 2 puffs into the lungs every 4 (four) hours as needed for wheezing or shortness of breath. What changed: Another medication with the same name was changed. Make sure you understand how and when to take each.   aspirin EC 81 MG tablet Take 1 tablet (81 mg total) by mouth daily.   atorvastatin 80 MG tablet Commonly known as: LIPITOR Take 1 tablet (80 mg total) by mouth daily.   Breo Ellipta 200-25 MCG/ACT Aepb Generic drug: fluticasone furoate-vilanterol Inhale 1 puff into the lungs once daily.   cetirizine 10 MG tablet Commonly known as: ZYRTEC Take 1 tablet (10 mg total) by mouth daily.   [place] Nasal Spray 0.65 % nasal spray Generic drug: sodium chloride Place 2 sprays into both nostrils every 4 (four) hours. What changed:  how to take this when to take this reasons to take this   diphenhydrAMINE 25 mg capsule Commonly known as: BENADRYL Take 25-50 mg by mouth every 6 (six) hours as needed for itching or allergies.   EPINEPHrine 0.3 mg/0.3 mL Soaj injection Commonly known as: EPI-PEN Inject 0.3 mg into the muscle as needed for anaphylaxis.   Flutter Devi Use as directed   furosemide 20 MG tablet Commonly known as: LASIX Take 1 tablet (20 mg total) by mouth daily.   hydrALAZINE 10 MG tablet Commonly known as: APRESOLINE Take 1 tablet (10 mg total) by mouth 2 (two) times daily.   hydrochlorothiazide 25 MG tablet Commonly known as:  HYDRODIURIL Take 1 tablet (25 mg total) by mouth daily.   hydrOXYzine 25 MG tablet Commonly known as: ATARAX Take 1 tablet (25 mg total) by mouth every 8 (eight) hours as needed for up to 15 doses for itching.   Medical Compression Stockings Misc R60.0   Olopatadine HCl 0.2 % Soln Apply 1 drop to affected eyes daily as directed. What changed:  how to take this when to take this reasons to take this   omeprazole 20 MG capsule Commonly known as: PRILOSEC Take 1 capsule (20 mg total) by mouth daily.   phentermine 37.5 MG capsule Take 1 capsule (37.5 mg total) by mouth every morning.   Spiriva Respimat 2.5 MCG/ACT Aers  Generic drug: Tiotropium Bromide Monohydrate Inhale 2 puffs into the lungs daily.         REVIEW OF SYSTEMS: Pertinent positives and negatives discussed in HPI.   Objective:   Physical Exam: BP 118/70   Pulse 74   Temp (!) 97.3 F (36.3 C)   Resp 16   Ht '5\' 6"'$  (1.676 m)   Wt 255 lb (115.7 kg)   SpO2 97%   BMI 41.16 kg/m  Body mass index is 41.16 kg/m. GEN: alert, well developed HEENT: clear conjunctiva, TM grey and translucent, nose with + inferior turbinate hypertrophy, pale nasal mucosa, slight clear rhinorrhea, + cobblestoning HEART: regular rate and rhythm, no murmur LUNGS: clear to auscultation bilaterally, no coughing, unlabored respiration ABDOMEN: soft, non distended  SKIN: no rashes or lesions  Reviewed:  ER note and PCP note in HPI  Assessment:   1. Idiopathic urticaria   2. Shellfish allergy   3. Fish allergy   4. Chronic rhinitis     Plan/Recommendations:  Chronic Rhinitis - Hold all anti-histamines (Benadryl, Zyrtec, Claritin, Xyzal, Allegra) until next visit for skin test. - We will do the skin testing 1-59.   - Keep follow up for skin testing on 10/07/2022 at 9:30 AM  Chronic Idiopathic Urticaria  - at this time etiology of hives and swelling is unknown.  Hives can be caused by a variety of different triggers  including illness/infection, foods, medications, stings, exercise, pressure, vibrations, extremes of temperature to name a few however majority of the time there is no identifiable trigger. - recent labwork in 2023: normal TSH, mild elevated Hgb on CBC, elevated sCr and AlkP with CKD.  Will likely obtain tryptase, CSU Ab, alpha gal and ANA at next visit also.  - will discuss anti histamine dosing after skin testing.  Food Allergy - Continue avoidance of seafood. I am not sure if GI symptoms are due to intolerance or IgE mediated.  Will include in testing at next visit. If negative, likely intolerances.   No follow-ups on file.  Harlon Flor, MD [facility] [place] [facility] [place]

## 2022-09-30 NOTE — Patient Instructions (Addendum)
Rhinitis Hives - Hold all anti-histamines (Benadryl, Zyrtec, Claritin, Xyzal, Allegra) until next visit for skin test. - We will do the skin testing then and discuss the next plan. - Keep follow up for skin testing on 10/07/2022 at 9:30 AM

## 2022-10-02 NOTE — Progress Notes (Unsigned)
Cardiology Office Note:    Date:  10/03/2022   ID:  Krystal Allen, DOB 03-May-1956, MRN [id number]  PCP:  Ladell Pier, MD   [facility] Cardiologist:  Lenna Sciara, MD Referring MD: Ladell Pier, MD   Chief Complaint/Reason for Referral: Dyspnea on exertion  ASSESSMENT:    1. Dyspnea, unspecified type   2. Aortic atherosclerosis ([place])   3. Coronary artery disease involving native coronary artery of native heart without angina pectoris   4. Primary hypertension   5. Hyperlipidemia LDL goal <70   6. Stage 3 chronic kidney disease, unspecified whether stage 3a or 3b CKD ([place])   7. BMI 40.0-44.9, adult ([place])      PLAN:    In order of problems listed above: 1.  Dyspnea:   Coronary CTA demonstrated a moderate lesion of the mid LAD which was FFR negative.  Her pulmonary artery was mildly dilated on the study.  Given ongoing dyspnea and peripheral edema as well as history of interstitial lung disease will refer for right heart catheterization to assess her cardiac hemodynamics and pulmonary vascular resistance. 2.  Aortic atherosclerosis: Continue aspirin, and statin with tight blood pressure control. 3.  Coronary artery disease:  Continue aspirin, statin, and aggressive blood pressure control. 4.  Hypertension: Blood pressures well controlled on her current regimen 5.  Hyperlipidemia: LDL recently was over 70.   We will check lipid panel and LFTs in 2 months.  Patient was referred to Dr. Debara Pickett for elevated LP(a). 6.  Chronic kidney disease: Followed by nephrology. 7.  Elevated BMI: Consider referral to pharmacy in the future for pharmacotherapy recommendations.  Dispo:  Return in about 6 months (around 04/04/2023).      Medication Adjustments/Labs and Tests Ordered: Current medicines are reviewed at length with the patient today.  Concerns regarding medicines are outlined above.  The following changes have been made:     Labs/tests ordered: Orders  Placed This Encounter  Procedures   Basic metabolic panel   CBC   Lipid panel   Hepatic function panel    Medication Changes: Meds ordered this encounter  Medications   ezetimibe (ZETIA) 10 MG tablet    Sig: Take 1 tablet (10 mg total) by mouth daily.    Dispense:  90 tablet    Refill:  3     Current medicines are reviewed at length with the patient today.  The patient does not have concerns regarding medicines.   History of Present Illness:    FOCUSED PROBLEM LIST:   1.  Hypertension 2.  Hyperlipidemia 3.  BMI 42 4.  Aortic atherosclerosis and coronary calcification seen on chest CT 2023 5.  Prediabetes 6.  Hypertension 7.  Hyperlipidemia; with elevated LP(a) 8.  CKD stage III with creatinine around 1.3 9.  COPD on supplemental oxygen 10.  Interstitial lung disease 12.  Moderate disease of mid LAD on coronary CTA with negative FFR June 2023  May 2023 consultation:  The patient is a 66 y.o. female with the indicated medical history here for recommendations regarding shortness of breath.  She was seen in the emergency department recently for chest pain which was tender to palpation.  She tells me that she has had increasing shortness of breath.  Her albuterol inhaler does not seem to help this.  She does note some wheezing at times.  She is not short of breath at rest.  She has noticed increasing peripheral edema as well as some orthopnea.  She  denies any paroxysmal nocturnal dyspnea.  She denies any overt chest pain.  She has had no presyncope, syncope, or palpitations.  She has had no signs or symptoms of stroke.  She does have a long history of smoking and is on chronic supplemental oxygen.  Her last PFTs in our system 10/2017 and her FEV1 was 2.51 with a r moderately severe fusion defect.  Plan: Obtain coronary CT due to dyspnea; start empiric Lasix; increase atorvastatin to 40 mg.  Today: In the interim the patient had a coronary CTA which showed a moderate.  Her  atorvastatin was increased to 80 mg for an LDL above goal.  She tells me that starting Lasix has helped her peripheral edema.  She has not noticed that her breathing is much improved.  She denies however any orthopnea or paroxysmal nocturnal dyspnea.  She does get short of breath when he exerts herself more than moderately.  She denies any exertional angina.  She has not required any emergency room visits or hospitalizations.  She is scheduled to see Dr. Debara Pickett for elevated LP(a) level soon.         Current Medications: Current Meds  Medication Sig   acetaminophen (TYLENOL 8 HOUR) 650 MG CR tablet Take 1 tablet (650 mg total) by mouth 2 (two) times daily as needed for pain.   albuterol (PROAIR HFA) 108 (90 Base) MCG/ACT inhaler Inhale 2 puffs into the lungs every 4 (four) hours as needed for wheezing or shortness of breath.   albuterol (PROVENTIL) (2.5 MG/3ML) 0.083% nebulizer solution TAKE 3 MLS BY NEBULIZATION EVERY 6 (SIX) HOURS AS NEEDED FOR WHEEZING OR SHORTNESS OF BREATH. (Patient taking differently: Take 2.5 mg by nebulization every 6 (six) hours as needed for wheezing or shortness of breath.)   aspirin EC 81 MG tablet Take 1 tablet (81 mg total) by mouth daily.   atorvastatin (LIPITOR) 80 MG tablet Take 1 tablet (80 mg total) by mouth daily.   cetirizine (ZYRTEC) 10 MG tablet Take 1 tablet (10 mg total) by mouth daily.   diphenhydrAMINE (BENADRYL) 25 mg capsule Take 25-50 mg by mouth every 6 (six) hours as needed for itching or allergies.   Elastic Bandages & Supports (MEDICAL COMPRESSION STOCKINGS) MISC R60.0   EPINEPHrine 0.3 mg/0.3 mL IJ SOAJ injection Inject 0.3 mg into the muscle as needed for anaphylaxis.   ezetimibe (ZETIA) 10 MG tablet Take 1 tablet (10 mg total) by mouth daily.   fluticasone furoate-vilanterol (BREO ELLIPTA) 200-25 MCG/ACT AEPB Inhale 1 puff into the lungs once daily.   furosemide (LASIX) 20 MG tablet Take 1 tablet (20 mg total) by mouth daily.   hydrALAZINE  (APRESOLINE) 10 MG tablet Take 1 tablet (10 mg total) by mouth 2 (two) times daily.   hydrochlorothiazide (HYDRODIURIL) 25 MG tablet Take 1 tablet (25 mg total) by mouth daily.   hydrOXYzine (ATARAX) 25 MG tablet Take 1 tablet (25 mg total) by mouth every 8 (eight) hours as needed for up to 15 doses for itching.   Olopatadine HCl 0.2 % SOLN Apply 1 drop to affected eyes daily as directed. (Patient taking differently: Place 1 drop into both eyes daily as needed (allergies).)   omeprazole (PRILOSEC) 20 MG capsule Take 1 capsule (20 mg total) by mouth daily.   phentermine 37.5 MG capsule Take 1 capsule (37.5 mg total) by mouth every morning.   Respiratory Therapy Supplies (FLUTTER) DEVI Use as directed   sodium chloride (OCEAN) 0.65 % nasal spray Place 2 sprays into  both nostrils every 4 (four) hours. (Patient taking differently: Place 2 sprays into the nose every 4 (four) hours as needed for congestion.)   Tiotropium Bromide Monohydrate (SPIRIVA RESPIMAT) 2.5 MCG/ACT AERS Inhale 2 puffs into the lungs daily.     Allergies:    Sulfa antibiotics, Chantix [varenicline tartrate], Fish allergy, Hygroton [chlorthalidone], Penicillins, and Latex   Social History:   Social History   Tobacco Use   Smoking status: Former    Packs/day: 0.50    Years: 40.00    Total pack years: 20.00    Types: Cigarettes    Quit date: 07/2021    Years since quitting: 1.1    Passive exposure: Never   Smokeless tobacco: Never   Tobacco comments:    Quit 102022  Vaping Use   Vaping Use: Former  Substance Use Topics   Alcohol use: Yes    Comment: occasional   Drug use: No     Family Hx: Family History  Problem Relation Age of Onset   Cancer Father        prostate   Hypertension Mother    Diabetes Mother    Hypertension Sister    Heart disease Maternal Uncle    Stroke Maternal Grandmother    Hypertension Maternal Grandmother    Hypertension Sister    Colon cancer Neg Hx    Stomach cancer Neg Hx     Rectal cancer Neg Hx    Colon polyps Neg Hx      Review of Systems:   Please see the history of present illness.    All other systems reviewed and are negative.     EKGs/Labs/Other Test Reviewed:    EKG:  EKG performed April 2023 that I personally reviewed demonstrates sinus rhythm with nonspecific ST-T wave changes  Prior CV studies:  TTE 2023:1.  Left ventricular ejection fraction, by estimation, is 60 to 65%. Left  ventricular ejection fraction by 3D volume is 66 %. The left ventricle has  normal function. The left ventricle has no regional wall motion  abnormalities. Left ventricular diastolic   parameters are consistent with Grade I diastolic dysfunction (impaired  relaxation).   2. Right ventricular systolic function is normal. The right ventricular  size is normal.   3. The mitral valve is normal in structure. Mild mitral valve  regurgitation. No evidence of mitral stenosis.   4. The aortic valve is calcified. There is mild calcification of the  aortic valve. There is mild thickening of the aortic valve. Aortic valve  regurgitation is not visualized. Aortic valve sclerosis/calcification is  present, without any evidence of  aortic stenosis.   5. The inferior vena cava is normal in size with greater than 50%  respiratory variability, suggesting right atrial pressure of 3 mmHg.   Coronary CT 2023: 1. Coronary calcium score of 24.9. This was 72nd percentile for age-, sex, and race-matched controls. 2. Normal coronary origin with right dominance. 3. Moderate mixed density plaque (50-60%) in the mid LAD. 4. Minimal CAD (<25%) in the LCX/RCA. 5. Dilated pulmonary artery suggestive of pulmonary hypertension.  Nuclear stress test 2012 low risk  Other studies Reviewed: Review of the additional studies/records demonstrates: Chest CT 2023 with aortic atherosclerosis and coronary artery calcification  Recent Labs: 01/11/2022: BNP 10.3 02/01/2022: Hemoglobin 15.8; Platelets  238 03/08/2022: TSH 3.490 08/16/2022: ALT 28; BUN 26; Creatinine, Ser 1.57; Potassium 4.0; Sodium 145   Recent Lipid Panel Lab Results  Component Value Date/Time   CHOL 185 08/16/2022 11:17 AM  TRIG 132 08/16/2022 11:17 AM   HDL 55 08/16/2022 11:17 AM   LDLCALC 107 (H) 08/16/2022 11:17 AM   LDLDIRECT 190.1 04/16/2013 03:31 PM    Risk Assessment/Calculations:           Physical Exam:    VS:  BP 130/80   Pulse 85   Ht '5\' 6"'$  (1.676 m)   Wt 268 lb 3.2 oz (121.7 kg)   SpO2 99%   BMI 43.29 kg/m    Wt Readings from Last 3 Encounters:  10/03/22 268 lb 3.2 oz (121.7 kg)  09/30/22 255 lb (115.7 kg)  08/16/22 269 lb (122 kg)    GENERAL:  No apparent distress, AOx3 HEENT:  No carotid bruits, +2 carotid impulses, no scleral icterus CAR: RRR no murmurs, gallops, rubs, or thrills RES:  Clear to auscultation bilaterally ABD:  Soft, nontender, nondistended, positive bowel sounds x 4 VASC:  +2 radial pulses, +2 carotid pulses, palpable pedal pulses NEURO:  CN 2-12 grossly intact; motor and sensory grossly intact PSYCH:  No active depression or anxiety EXT: +1 edema, without ecchymosis, or cyanosis  Signed, [name], MD  10/03/2022 11:32 AM    [facility], [place], [place]  [address] Phone: [phone]; Fax: [phone]   Note:  This document was prepared using Dragon voice recognition software and may include unintentional dictation errors.

## 2022-10-02 NOTE — H&P (View-Only) (Signed)
Cardiology Office Note:    Date:  10/03/2022   ID:  Krystal Allen, DOB 11/08/1955, MRN [id number]  PCP:  Ladell Pier, MD   [facility] Cardiologist:  Lenna Sciara, MD Referring MD: Ladell Pier, MD   Chief Complaint/Reason for Referral: Dyspnea on exertion  ASSESSMENT:    1. Dyspnea, unspecified type   2. Aortic atherosclerosis ([place])   3. Coronary artery disease involving native coronary artery of native heart without angina pectoris   4. Primary hypertension   5. Hyperlipidemia LDL goal <70   6. Stage 3 chronic kidney disease, unspecified whether stage 3a or 3b CKD ([place])   7. BMI 40.0-44.9, adult ([place])      PLAN:    In order of problems listed above: 1.  Dyspnea:   Coronary CTA demonstrated a moderate lesion of the mid LAD which was FFR negative.  Her pulmonary artery was mildly dilated on the study.  Given ongoing dyspnea and peripheral edema as well as history of interstitial lung disease will refer for right heart catheterization to assess her cardiac hemodynamics and pulmonary vascular resistance. 2.  Aortic atherosclerosis: Continue aspirin, and statin with tight blood pressure control. 3.  Coronary artery disease:  Continue aspirin, statin, and aggressive blood pressure control. 4.  Hypertension: Blood pressures well controlled on her current regimen 5.  Hyperlipidemia: LDL recently was over 70.   We will check lipid panel and LFTs in 2 months.  Patient was referred to Dr. Debara Pickett for elevated LP(a). 6.  Chronic kidney disease: Followed by nephrology. 7.  Elevated BMI: Consider referral to pharmacy in the future for pharmacotherapy recommendations.  Dispo:  Return in about 6 months (around 04/04/2023).      Medication Adjustments/Labs and Tests Ordered: Current medicines are reviewed at length with the patient today.  Concerns regarding medicines are outlined above.  The following changes have been made:     Labs/tests ordered: Orders  Placed This Encounter  Procedures   Basic metabolic panel   CBC   Lipid panel   Hepatic function panel    Medication Changes: Meds ordered this encounter  Medications   ezetimibe (ZETIA) 10 MG tablet    Sig: Take 1 tablet (10 mg total) by mouth daily.    Dispense:  90 tablet    Refill:  3     Current medicines are reviewed at length with the patient today.  The patient does not have concerns regarding medicines.   History of Present Illness:    FOCUSED PROBLEM LIST:   1.  Hypertension 2.  Hyperlipidemia 3.  BMI 42 4.  Aortic atherosclerosis and coronary calcification seen on chest CT 2023 5.  Prediabetes 6.  Hypertension 7.  Hyperlipidemia; with elevated LP(a) 8.  CKD stage III with creatinine around 1.3 9.  COPD on supplemental oxygen 10.  Interstitial lung disease 12.  Moderate disease of mid LAD on coronary CTA with negative FFR June 2023  May 2023 consultation:  The patient is a 66 y.o. female with the indicated medical history here for recommendations regarding shortness of breath.  She was seen in the emergency department recently for chest pain which was tender to palpation.  She tells me that she has had increasing shortness of breath.  Her albuterol inhaler does not seem to help this.  She does note some wheezing at times.  She is not short of breath at rest.  She has noticed increasing peripheral edema as well as some orthopnea.  She  denies any paroxysmal nocturnal dyspnea.  She denies any overt chest pain.  She has had no presyncope, syncope, or palpitations.  She has had no signs or symptoms of stroke.  She does have a long history of smoking and is on chronic supplemental oxygen.  Her last PFTs in our system 10/2017 and her FEV1 was 2.51 with a r moderately severe fusion defect.  Plan: Obtain coronary CT due to dyspnea; start empiric Lasix; increase atorvastatin to 40 mg.  Today: In the interim the patient had a coronary CTA which showed a moderate.  Her  atorvastatin was increased to 80 mg for an LDL above goal.  She tells me that starting Lasix has helped her peripheral edema.  She has not noticed that her breathing is much improved.  She denies however any orthopnea or paroxysmal nocturnal dyspnea.  She does get short of breath when he exerts herself more than moderately.  She denies any exertional angina.  She has not required any emergency room visits or hospitalizations.  She is scheduled to see Dr. Debara Pickett for elevated LP(a) level soon.         Current Medications: Current Meds  Medication Sig   acetaminophen (TYLENOL 8 HOUR) 650 MG CR tablet Take 1 tablet (650 mg total) by mouth 2 (two) times daily as needed for pain.   albuterol (PROAIR HFA) 108 (90 Base) MCG/ACT inhaler Inhale 2 puffs into the lungs every 4 (four) hours as needed for wheezing or shortness of breath.   albuterol (PROVENTIL) (2.5 MG/3ML) 0.083% nebulizer solution TAKE 3 MLS BY NEBULIZATION EVERY 6 (SIX) HOURS AS NEEDED FOR WHEEZING OR SHORTNESS OF BREATH. (Patient taking differently: Take 2.5 mg by nebulization every 6 (six) hours as needed for wheezing or shortness of breath.)   aspirin EC 81 MG tablet Take 1 tablet (81 mg total) by mouth daily.   atorvastatin (LIPITOR) 80 MG tablet Take 1 tablet (80 mg total) by mouth daily.   cetirizine (ZYRTEC) 10 MG tablet Take 1 tablet (10 mg total) by mouth daily.   diphenhydrAMINE (BENADRYL) 25 mg capsule Take 25-50 mg by mouth every 6 (six) hours as needed for itching or allergies.   Elastic Bandages & Supports (MEDICAL COMPRESSION STOCKINGS) MISC R60.0   EPINEPHrine 0.3 mg/0.3 mL IJ SOAJ injection Inject 0.3 mg into the muscle as needed for anaphylaxis.   ezetimibe (ZETIA) 10 MG tablet Take 1 tablet (10 mg total) by mouth daily.   fluticasone furoate-vilanterol (BREO ELLIPTA) 200-25 MCG/ACT AEPB Inhale 1 puff into the lungs once daily.   furosemide (LASIX) 20 MG tablet Take 1 tablet (20 mg total) by mouth daily.   hydrALAZINE  (APRESOLINE) 10 MG tablet Take 1 tablet (10 mg total) by mouth 2 (two) times daily.   hydrochlorothiazide (HYDRODIURIL) 25 MG tablet Take 1 tablet (25 mg total) by mouth daily.   hydrOXYzine (ATARAX) 25 MG tablet Take 1 tablet (25 mg total) by mouth every 8 (eight) hours as needed for up to 15 doses for itching.   Olopatadine HCl 0.2 % SOLN Apply 1 drop to affected eyes daily as directed. (Patient taking differently: Place 1 drop into both eyes daily as needed (allergies).)   omeprazole (PRILOSEC) 20 MG capsule Take 1 capsule (20 mg total) by mouth daily.   phentermine 37.5 MG capsule Take 1 capsule (37.5 mg total) by mouth every morning.   Respiratory Therapy Supplies (FLUTTER) DEVI Use as directed   sodium chloride (OCEAN) 0.65 % nasal spray Place 2 sprays into  both nostrils every 4 (four) hours. (Patient taking differently: Place 2 sprays into the nose every 4 (four) hours as needed for congestion.)   Tiotropium Bromide Monohydrate (SPIRIVA RESPIMAT) 2.5 MCG/ACT AERS Inhale 2 puffs into the lungs daily.     Allergies:    Sulfa antibiotics, Chantix [varenicline tartrate], Fish allergy, Hygroton [chlorthalidone], Penicillins, and Latex   Social History:   Social History   Tobacco Use   Smoking status: Former    Packs/day: 0.50    Years: 40.00    Total pack years: 20.00    Types: Cigarettes    Quit date: 07/2021    Years since quitting: 1.1    Passive exposure: Never   Smokeless tobacco: Never   Tobacco comments:    Quit 102022  Vaping Use   Vaping Use: Former  Substance Use Topics   Alcohol use: Yes    Comment: occasional   Drug use: No     Family Hx: Family History  Problem Relation Age of Onset   Cancer Father        prostate   Hypertension Mother    Diabetes Mother    Hypertension Sister    Heart disease Maternal Uncle    Stroke Maternal Grandmother    Hypertension Maternal Grandmother    Hypertension Sister    Colon cancer Neg Hx    Stomach cancer Neg Hx     Rectal cancer Neg Hx    Colon polyps Neg Hx      Review of Systems:   Please see the history of present illness.    All other systems reviewed and are negative.     EKGs/Labs/Other Test Reviewed:    EKG:  EKG performed April 2023 that I personally reviewed demonstrates sinus rhythm with nonspecific ST-T wave changes  Prior CV studies:  TTE 2023:1.  Left ventricular ejection fraction, by estimation, is 60 to 65%. Left  ventricular ejection fraction by 3D volume is 66 %. The left ventricle has  normal function. The left ventricle has no regional wall motion  abnormalities. Left ventricular diastolic   parameters are consistent with Grade I diastolic dysfunction (impaired  relaxation).   2. Right ventricular systolic function is normal. The right ventricular  size is normal.   3. The mitral valve is normal in structure. Mild mitral valve  regurgitation. No evidence of mitral stenosis.   4. The aortic valve is calcified. There is mild calcification of the  aortic valve. There is mild thickening of the aortic valve. Aortic valve  regurgitation is not visualized. Aortic valve sclerosis/calcification is  present, without any evidence of  aortic stenosis.   5. The inferior vena cava is normal in size with greater than 50%  respiratory variability, suggesting right atrial pressure of 3 mmHg.   Coronary CT 2023: 1. Coronary calcium score of 24.9. This was 72nd percentile for age-, sex, and race-matched controls. 2. Normal coronary origin with right dominance. 3. Moderate mixed density plaque (50-60%) in the mid LAD. 4. Minimal CAD (<25%) in the LCX/RCA. 5. Dilated pulmonary artery suggestive of pulmonary hypertension.  Nuclear stress test 2012 low risk  Other studies Reviewed: Review of the additional studies/records demonstrates: Chest CT 2023 with aortic atherosclerosis and coronary artery calcification  Recent Labs: 01/11/2022: BNP 10.3 02/01/2022: Hemoglobin 15.8; Platelets  238 03/08/2022: TSH 3.490 08/16/2022: ALT 28; BUN 26; Creatinine, Ser 1.57; Potassium 4.0; Sodium 145   Recent Lipid Panel Lab Results  Component Value Date/Time   CHOL 185 08/16/2022 11:17 AM  TRIG 132 08/16/2022 11:17 AM   HDL 55 08/16/2022 11:17 AM   LDLCALC 107 (H) 08/16/2022 11:17 AM   LDLDIRECT 190.1 04/16/2013 03:31 PM    Risk Assessment/Calculations:           Physical Exam:    VS:  BP 130/80   Pulse 85   Ht '5\' 6"'$  (1.676 m)   Wt 268 lb 3.2 oz (121.7 kg)   SpO2 99%   BMI 43.29 kg/m    Wt Readings from Last 3 Encounters:  10/03/22 268 lb 3.2 oz (121.7 kg)  09/30/22 255 lb (115.7 kg)  08/16/22 269 lb (122 kg)    GENERAL:  No apparent distress, AOx3 HEENT:  No carotid bruits, +2 carotid impulses, no scleral icterus CAR: RRR no murmurs, gallops, rubs, or thrills RES:  Clear to auscultation bilaterally ABD:  Soft, nontender, nondistended, positive bowel sounds x 4 VASC:  +2 radial pulses, +2 carotid pulses, palpable pedal pulses NEURO:  CN 2-12 grossly intact; motor and sensory grossly intact PSYCH:  No active depression or anxiety EXT: +1 edema, without ecchymosis, or cyanosis  Signed, [name], MD  10/03/2022 11:32 AM    [facility], [place], [place]  [address] Phone: [phone]; Fax: [phone]   Note:  This document was prepared using Dragon voice recognition software and may include unintentional dictation errors.

## 2022-10-03 ENCOUNTER — Other Ambulatory Visit: Payer: Self-pay

## 2022-10-03 ENCOUNTER — Encounter: Payer: Self-pay | Admitting: Internal Medicine

## 2022-10-03 ENCOUNTER — Ambulatory Visit: Payer: Medicare Other | Attending: Internal Medicine | Admitting: Internal Medicine

## 2022-10-03 VITALS — BP 130/80 | HR 85 | Ht 66.0 in | Wt 268.2 lb

## 2022-10-03 DIAGNOSIS — I10 Essential (primary) hypertension: Secondary | ICD-10-CM

## 2022-10-03 DIAGNOSIS — R0600 Dyspnea, unspecified: Secondary | ICD-10-CM

## 2022-10-03 DIAGNOSIS — Z6841 Body Mass Index (BMI) 40.0 and over, adult: Secondary | ICD-10-CM

## 2022-10-03 DIAGNOSIS — I2510 Atherosclerotic heart disease of native coronary artery without angina pectoris: Secondary | ICD-10-CM | POA: Diagnosis not present

## 2022-10-03 DIAGNOSIS — E785 Hyperlipidemia, unspecified: Secondary | ICD-10-CM

## 2022-10-03 DIAGNOSIS — N183 Chronic kidney disease, stage 3 unspecified: Secondary | ICD-10-CM

## 2022-10-03 DIAGNOSIS — I700 Atherosclerosis of aorta: Secondary | ICD-10-CM | POA: Diagnosis not present

## 2022-10-03 LAB — BASIC METABOLIC PANEL
BUN/Creatinine Ratio: 19 (ref 12–28)
BUN: 26 mg/dL (ref 8–27)
CO2: 28 mmol/L (ref 20–29)
Calcium: 9.8 mg/dL (ref 8.7–10.3)
Chloride: 99 mmol/L (ref 96–106)
Creatinine, Ser: 1.34 mg/dL — ABNORMAL HIGH (ref 0.57–1.00)
Glucose: 115 mg/dL — ABNORMAL HIGH (ref 70–99)
Potassium: 3 mmol/L — ABNORMAL LOW (ref 3.5–5.2)
Sodium: 139 mmol/L (ref 134–144)
eGFR: 44 mL/min/{1.73_m2} — ABNORMAL LOW (ref >59)

## 2022-10-03 LAB — CBC
Hematocrit: 41.8 % (ref 34.0–46.6)
Hemoglobin: 14.4 g/dL (ref 11.1–15.9)
MCH: 29.3 pg (ref 26.6–33.0)
MCHC: 34.4 g/dL (ref 31.5–35.7)
MCV: 85 fL (ref 79–97)
Platelets: 260 10*3/uL (ref 150–450)
RBC: 4.92 x10E6/uL (ref 3.77–5.28)
RDW: 17.9 % — ABNORMAL HIGH (ref 11.7–15.4)
WBC: 6.4 10*3/uL (ref 3.4–10.8)

## 2022-10-03 MED ORDER — EZETIMIBE 10 MG PO TABS
10.0000 mg | ORAL_TABLET | Freq: Every day | ORAL | 3 refills | Status: DC
  Filled 2022-10-03 – 2022-12-30 (×2): qty 90, 90d supply, fill #0
  Filled 2022-12-30 – 2023-04-13 (×2): qty 90, 90d supply, fill #1
  Filled 2023-07-20: qty 90, 90d supply, fill #2

## 2022-10-03 MED ORDER — ALBUTEROL SULFATE (2.5 MG/3ML) 0.083% IN NEBU
INHALATION_SOLUTION | RESPIRATORY_TRACT | 1 refills | Status: DC
  Filled 2022-10-03: qty 90, 7d supply, fill #0
  Filled 2022-10-10: qty 90, 7d supply, fill #1

## 2022-10-03 NOTE — Patient Instructions (Signed)
Medication Instructions:  Your physician has recommended you make the following change in your medication:   Start Zetia '10mg'$  daily *If you need a refill on your cardiac medications before your next appointment, please call your pharmacy*   Lab Work: CBC and BMET today  Lipids and LFT in 2 mtns   If you have labs (blood work) drawn today and your tests are completely normal, you will receive your results only by: [place] (if you have MyChart) OR A paper copy in the mail If you have any lab test that is abnormal or we need to change your treatment, we will call you to review the results.   Testing/Procedures: Your physician has requested that you have a cardiac catheterization. Cardiac catheterization is used to diagnose and/or treat various heart conditions. Doctors may recommend this procedure for a number of different reasons. The most common reason is to evaluate chest pain. Chest pain can be a symptom of coronary artery disease (CAD), and cardiac catheterization can show whether plaque is narrowing or blocking your heart's arteries. This procedure is also used to evaluate the valves, as well as measure the blood flow and oxygen levels in different parts of your heart. For further information please visit [email or web address]. Please follow instruction sheet, as given.    Follow-Up: At [facility], you and your health needs are our priority.  As part of our continuing mission to provide you with exceptional heart care, we have created designated Provider Care Teams.  These Care Teams include your primary Cardiologist (physician) and Advanced Practice Providers (APPs -  Physician Assistants and Nurse Practitioners) who all work together to provide you with the care you need, when you need it.  Your next appointment:   6 month(s)  The format for your next appointment:   In Person  Provider:   [name], MD     Other Instructions       Cardiac/Peripheral  Catheterization   You are scheduled for a Cardiac Catheterization on Wednesday, December 13 with Dr. Lenna Sciara.  1. Please arrive at the Main Entrance A at [facility]: [place], [place] [address] on December 13 at 9:30 AM (This time is two hours before your procedure to ensure your preparation). Free valet parking service is available. You will check in at ADMITTING. The support person will be asked to wait in the waiting room.  It is OK to have someone drop you off and come back when you are ready to be discharged.        Special note: Every effort is made to have your procedure done on time. Please understand that emergencies sometimes delay scheduled procedures.   . 2. Diet: Do not eat solid foods after midnight.  You may have clear liquids until 5 AM the day of the procedure.  3. Labs: today  4. Medication instructions in preparation for your procedure:   Contrast Allergy: No    Current Outpatient Medications (Cardiovascular):    atorvastatin (LIPITOR) 80 MG tablet, Take 1 tablet (80 mg total) by mouth daily.   EPINEPHrine 0.3 mg/0.3 mL IJ SOAJ injection, Inject 0.3 mg into the muscle as needed for anaphylaxis.   furosemide (LASIX) 20 MG tablet, Take 1 tablet (20 mg total) by mouth daily.   hydrALAZINE (APRESOLINE) 10 MG tablet, Take 1 tablet (10 mg total) by mouth 2 (two) times daily.   hydrochlorothiazide (HYDRODIURIL) 25 MG tablet, Take 1 tablet (25 mg total) by mouth daily.  Current Outpatient Medications (Respiratory):    albuterol (PROAIR HFA) 108 (90 Base) MCG/ACT inhaler, Inhale 2 puffs into the lungs every 4 (four) hours as needed for wheezing or shortness of breath.   albuterol (PROVENTIL) (2.5 MG/3ML) 0.083% nebulizer solution, TAKE 3 MLS BY NEBULIZATION EVERY 6 (SIX) HOURS AS NEEDED FOR WHEEZING OR SHORTNESS OF BREATH. (Patient taking differently: Take 2.5 mg by nebulization every 6 (six) hours as needed for wheezing or shortness of breath.)    cetirizine (ZYRTEC) 10 MG tablet, Take 1 tablet (10 mg total) by mouth daily.   diphenhydrAMINE (BENADRYL) 25 mg capsule, Take 25-50 mg by mouth every 6 (six) hours as needed for itching or allergies.   fluticasone furoate-vilanterol (BREO ELLIPTA) 200-25 MCG/ACT AEPB, Inhale 1 puff into the lungs once daily.   sodium chloride (OCEAN) 0.65 % nasal spray, Place 2 sprays into both nostrils every 4 (four) hours. (Patient taking differently: Place 2 sprays into the nose every 4 (four) hours as needed for congestion.)   Tiotropium Bromide Monohydrate (SPIRIVA RESPIMAT) 2.5 MCG/ACT AERS, Inhale 2 puffs into the lungs daily.  Current Outpatient Medications (Analgesics):    acetaminophen (TYLENOL 8 HOUR) 650 MG CR tablet, Take 1 tablet (650 mg total) by mouth 2 (two) times daily as needed for pain.   aspirin EC 81 MG tablet, Take 1 tablet (81 mg total) by mouth daily.   Current Outpatient Medications (Other):    Elastic Bandages & Supports (MEDICAL COMPRESSION STOCKINGS) MISC, R60.0   hydrOXYzine (ATARAX) 25 MG tablet, Take 1 tablet (25 mg total) by mouth every 8 (eight) hours as needed for up to 15 doses for itching.   Olopatadine HCl 0.2 % SOLN, Apply 1 drop to affected eyes daily as directed. (Patient taking differently: Place 1 drop into both eyes daily as needed (allergies).)   omeprazole (PRILOSEC) 20 MG capsule, Take 1 capsule (20 mg total) by mouth daily.   phentermine 37.5 MG capsule, Take 1 capsule (37.5 mg total) by mouth every morning.   Respiratory Therapy Supplies (FLUTTER) DEVI, Use as directed *For reference purposes while preparing patient instructions.   Delete this med list prior to printing instructions for patient.*   Stop taking, Lasix (Furosemide)  Wednesday, December 13, And HCTZ    On the morning of your procedure, take Aspirin 81 mg and any morning medicines NOT listed above.  You may use sips of water.  5. Plan to go home the same day, you will only stay overnight if  medically necessary. 6. You MUST have a responsible adult to drive you home. 7. An adult MUST be with you the first 24 hours after you arrive home. 8. Bring a current list of your medications, and the last time and date medication taken. 9. Bring ID and current insurance cards. 10.Please wear clothes that are easy to get on and off and wear slip-on shoes.  Thank you for allowing [place] to care for you!   -- [facility]

## 2022-10-04 ENCOUNTER — Other Ambulatory Visit (HOSPITAL_COMMUNITY): Payer: Self-pay

## 2022-10-05 ENCOUNTER — Encounter (HOSPITAL_BASED_OUTPATIENT_CLINIC_OR_DEPARTMENT_OTHER): Payer: Self-pay | Admitting: Internal Medicine

## 2022-10-05 ENCOUNTER — Ambulatory Visit (INDEPENDENT_AMBULATORY_CARE_PROVIDER_SITE_OTHER): Payer: Medicare Other | Admitting: Internal Medicine

## 2022-10-05 ENCOUNTER — Encounter: Payer: Self-pay | Admitting: Internal Medicine

## 2022-10-05 ENCOUNTER — Telehealth: Payer: Self-pay | Admitting: Internal Medicine

## 2022-10-05 VITALS — BP 110/70 | HR 72 | Ht 66.0 in | Wt 267.0 lb

## 2022-10-05 DIAGNOSIS — E7841 Elevated Lipoprotein(a): Secondary | ICD-10-CM

## 2022-10-05 DIAGNOSIS — I2510 Atherosclerotic heart disease of native coronary artery without angina pectoris: Secondary | ICD-10-CM

## 2022-10-05 DIAGNOSIS — N183 Chronic kidney disease, stage 3 unspecified: Secondary | ICD-10-CM

## 2022-10-05 DIAGNOSIS — E785 Hyperlipidemia, unspecified: Secondary | ICD-10-CM | POA: Diagnosis not present

## 2022-10-05 NOTE — Telephone Encounter (Signed)
PA for Repatha is required - Dyslipidemia - Elevated LPa - On high-dose/intensity statin - Coronary Disease by CT

## 2022-10-05 NOTE — Patient Instructions (Signed)
Medication Instructions:  Dr. Debara Pickett recommends Repatha Sureclick '140mg'$ /ml (PCSK9). This is an injectable cholesterol medication self-administered once every 14 days. This medication will likely need prior approval with your insurance company, which we will work on. If the medication is not approved initially, we may need to do an appeal with your insurance.   Administer medication in area of fatty tissue such as abdomen, outer thigh, back of upper arm - and rotate site with each injection Store medication in refrigerator until ready to administer - allow to sit at room temp for 30 mins - 1 hour prior to injection Dispose of medication in a SHARPS container - your pharmacy should be able to direct you on this and proper disposal   If you need a co-pay card for Repatha: [email or web address] If you need a co-pay card for Praluent: [email or web address].uy  Patient Assistance:    These foundations have funds at various times.   The PAN Foundation: https://www.panfoundation.org/disease-funds/hypercholesterolemia/ -- can sign up for wait list  The [facility] offers assistance to help pay for medication copays.  They will cover copays for all cholesterol lowering meds, including statins, fibrates, omega-3 fish oils like Vascepa, ezetimibe, Repatha, Praluent, Nexletol, Nexlizet.  The cards are usually good for $2,500 or 12 months, whichever comes first. Go to healthwellfoundation.org Click on "Apply Now" Answer questions as to whom is applying (patient or representative) Your disease fund will be "hypercholesterolemia - Medicare access" They will ask questions about finances and which medications you are taking for cholesterol When you submit, the approval is usually within minutes.  You will need to print the card information from the site You will need to show this information to your pharmacy, they will bill your Medicare Part D plan first -then bill  Health Well --for the copay.   You can also call them at [phone], although the hold times can be quite long.     *If you need a refill on your cardiac medications before your next appointment, please call your pharmacy*   Lab Work: FASTING lab work to check cholesterol in 3-4 months  If you have labs (blood work) drawn today and your tests are completely normal, you will receive your results only by: [place] (if you have MyChart) OR A paper copy in the mail If you have any lab test that is abnormal or we need to change your treatment, we will call you to review the results.  Follow-Up: At [facility], you and your health needs are our priority.  As part of our continuing mission to provide you with exceptional heart care, we have created designated Provider Care Teams.  These Care Teams include your primary Cardiologist (physician) and Advanced Practice Providers (APPs -  Physician Assistants and Nurse Practitioners) who all work together to provide you with the care you need, when you need it.  We recommend signing up for the patient portal called "MyChart".  Sign up information is provided on this After Visit Summary.  MyChart is used to connect with patients for Virtual Visits (Telemedicine).  Patients are able to view lab/test results, encounter notes, upcoming appointments, etc.  Non-urgent messages can be sent to your provider as well.   To learn more about what you can do with MyChart, go to [email or web address].    Your next appointment:    3-4 months with Dr. Debara Pickett

## 2022-10-05 NOTE — Progress Notes (Signed)
LIPID CLINIC CONSULT NOTE  Chief Complaint:  Manage dyslipidemia  Primary Care Physician: Ladell Pier, MD  Primary Cardiologist:  [name], MD  HPI:  Krystal Allen is a 66 y.o. female who is being seen today for the evaluation of dyslipidemia at the request of [name], MD. This is a pleasant 66 yo female kindly referred for evaluation and management of dyslipidemia.  Ms. Fosse underwent CT coronary angiography this past summer which demonstrated nonobstructive coronary disease but she did have an elevated calcium score of 24.9, 72nd percentile for age and sex matched controls.  She also has a diagnostic history of COPD and was noted to have probable pulmonary hypertension.  She has been short of breath and is scheduled for an upcoming right heart catheterization.  She was referred because of an abnormal lipid profile, specifically an elevated LP(a) which was high at 318.9 nmol/L.  Her last on treatment lipid profile showed total cholesterol 185, triglycerides 132, HDL 55 and LDL 107.  This is down from 130 about 4 months previous to that.  She is on atorvastatin 80 mg daily and was recently considered for ezetimibe.  She has not started on this medication.  PMHx:  Past Medical History:  Diagnosis Date   Allergy    COPD (chronic obstructive pulmonary disease) (HCC)    Depression    GERD (gastroesophageal reflux disease)    Hernia, abdominal    History of chicken pox    Hyperlipidemia    Hypertension    Internal hemorrhoids with Grade 3 prolapse and bleeding 06/19/2013   Osteoarthritis    Personal history of colonic adenomas 06/25/2013    Past Surgical History:  Procedure Laterality Date   ABDOMINAL HYSTERECTOMY  16yr ago   due to heavy bleeding and fibroids    COLONOSCOPY     HEMORRHOID BANDING  2014   HEMORRHOID SURGERY  [id number]   UMBILICAL HERNIA REPAIR  06/2015    FAMHx:  Family History  Problem Relation Age of Onset   Cancer Father         prostate   Hypertension Mother    Diabetes Mother    Hypertension Sister    Heart disease Maternal Uncle    Stroke Maternal Grandmother    Hypertension Maternal Grandmother    Hypertension Sister    Colon cancer Neg Hx    Stomach cancer Neg Hx    Rectal cancer Neg Hx    Colon polyps Neg Hx     SOCHx:   reports that she quit smoking about 14 months ago. Her smoking use included cigarettes. She has a 20.00 pack-year smoking history. She has never been exposed to tobacco smoke. She has never used smokeless tobacco. She reports current alcohol use. She reports that she does not use drugs.  ALLERGIES:  Allergies  Allergen Reactions   Sulfa Antibiotics Anaphylaxis and Hives   Chantix [Varenicline Tartrate]     Bad dreams   Fish Allergy Other (See Comments)    Severe stomach cramps    Hygroton [Chlorthalidone]    Penicillins Other (See Comments)    Has patient had a PCN reaction causing immediate rash, facial/tongue/throat swelling, SOB or lightheadedness with hypotension: No Has patient had a PCN reaction causing severe rash involving mucus membranes or skin necrosis: No Has patient had a PCN reaction that required hospitalization No Has patient had a PCN reaction occurring within the last 10 years: No If all of the above answers are "  NO", then may proceed with Cephalosporin use.  Welts    Latex Rash and Other (See Comments)    Dry skin    ROS: Pertinent items noted in HPI and remainder of comprehensive ROS otherwise negative.  HOME MEDS: Current Outpatient Medications on File Prior to Visit  Medication Sig Dispense Refill   acetaminophen (TYLENOL 8 HOUR) 650 MG CR tablet Take 1 tablet (650 mg total) by mouth 2 (two) times daily as needed for pain. 60 tablet 5   albuterol (PROAIR HFA) 108 (90 Base) MCG/ACT inhaler Inhale 2 puffs into the lungs every 4 (four) hours as needed for wheezing or shortness of breath. 8.5 g 2   albuterol (PROVENTIL) (2.5 MG/3ML) 0.083% nebulizer  solution TAKE 3 MLS BY NEBULIZATION EVERY 6 (SIX) HOURS AS NEEDED FOR WHEEZING OR SHORTNESS OF BREATH. 90 mL 1   aspirin EC 81 MG tablet Take 1 tablet (81 mg total) by mouth daily. 100 tablet 1   atorvastatin (LIPITOR) 80 MG tablet Take 1 tablet (80 mg total) by mouth daily. 90 tablet 3   cetirizine (ZYRTEC) 10 MG tablet Take 1 tablet (10 mg total) by mouth daily. 30 tablet 11   diphenhydrAMINE (BENADRYL) 25 mg capsule Take 25-50 mg by mouth every 6 (six) hours as needed for itching or allergies.     Elastic Bandages & Supports (MEDICAL COMPRESSION STOCKINGS) MISC R60.0 1 each 0   EPINEPHrine 0.3 mg/0.3 mL IJ SOAJ injection Inject 0.3 mg into the muscle as needed for anaphylaxis. 1 each 2   ezetimibe (ZETIA) 10 MG tablet Take 1 tablet (10 mg total) by mouth daily. 90 tablet 3   fluticasone furoate-vilanterol (BREO ELLIPTA) 200-25 MCG/ACT AEPB Inhale 1 puff into the lungs once daily. 60 each 5   furosemide (LASIX) 20 MG tablet Take 1 tablet (20 mg total) by mouth daily. 90 tablet 3   hydrALAZINE (APRESOLINE) 10 MG tablet Take 1 tablet (10 mg total) by mouth 2 (two) times daily. 60 tablet 5   hydrochlorothiazide (HYDRODIURIL) 25 MG tablet Take 1 tablet (25 mg total) by mouth daily. 90 tablet 1   hydrOXYzine (ATARAX) 25 MG tablet Take 1 tablet (25 mg total) by mouth every 8 (eight) hours as needed for up to 15 doses for itching. 15 tablet 0   Olopatadine HCl 0.2 % SOLN Apply 1 drop to affected eyes daily as directed. (Patient taking differently: Place 1 drop into both eyes daily as needed (allergies).) 2.5 mL 1   omeprazole (PRILOSEC) 20 MG capsule Take 1 capsule (20 mg total) by mouth daily. 90 capsule 3   phentermine 37.5 MG capsule Take 1 capsule (37.5 mg total) by mouth every morning. 30 capsule 1   Respiratory Therapy Supplies (FLUTTER) DEVI Use as directed 1 each 0   sodium chloride (OCEAN) 0.65 % nasal spray Place 2 sprays into both nostrils every 4 (four) hours. (Patient taking differently:  Place 2 sprays into the nose every 4 (four) hours as needed for congestion.) 44 mL 0   Tiotropium Bromide Monohydrate (SPIRIVA RESPIMAT) 2.5 MCG/ACT AERS Inhale 2 puffs into the lungs daily. 4 g 5   No current facility-administered medications on file prior to visit.    LABS/IMAGING: Results for orders placed or performed in visit on 10/03/22 (from the past 48 hour(s))  Basic metabolic panel     Status: Abnormal   Collection Time: 10/03/22 11:20 AM  Result Value Ref Range   Glucose 115 (H) 70 - 99 mg/dL   BUN 26  8 - 27 mg/dL   Creatinine, Ser 1.34 (H) 0.57 - 1.00 mg/dL   eGFR 44 (L) >59 mL/min/1.73   BUN/Creatinine Ratio 19 12 - 28   Sodium 139 134 - 144 mmol/L   Potassium 3.0 (L) 3.5 - 5.2 mmol/L    Comment: **Result Repeated**   Chloride 99 96 - 106 mmol/L   CO2 28 20 - 29 mmol/L   Calcium 9.8 8.7 - 10.3 mg/dL  CBC     Status: Abnormal   Collection Time: 10/03/22 11:20 AM  Result Value Ref Range   WBC 6.4 3.4 - 10.8 x10E3/uL   RBC 4.92 3.77 - 5.28 x10E6/uL   Hemoglobin 14.4 11.1 - 15.9 g/dL   Hematocrit 41.8 34.0 - 46.6 %   MCV 85 79 - 97 fL   MCH 29.3 26.6 - 33.0 pg   MCHC 34.4 31.5 - 35.7 g/dL   RDW 17.9 (H) 11.7 - 15.4 %   Platelets 260 150 - 450 x10E3/uL   No results found.  LIPID PANEL:    Component Value Date/Time   CHOL 185 08/16/2022 1117   TRIG 132 08/16/2022 1117   HDL 55 08/16/2022 1117   CHOLHDL 3.4 08/16/2022 1117   CHOLHDL 3.7 08/17/2021 0340   VLDL 12 08/17/2021 0340   LDLCALC 107 (H) 08/16/2022 1117   LDLDIRECT 190.1 04/16/2013 1531    WEIGHTS: Wt Readings from Last 3 Encounters:  10/05/22 267 lb (121.1 kg)  10/03/22 268 lb 3.2 oz (121.7 kg)  09/30/22 255 lb (115.7 kg)    VITALS: BP 110/70   Pulse 72   Ht _0  (1.676 m)   Wt 267 lb (121.1 kg)   BMI 43.09 kg/m   EXAM: Deferred  EKG: Deferred  ASSESSMENT: Mixed dyslipidemia, goal LDL less than 70 Elevated coronary artery calcium score with nonobstructive coronary disease  (03/2022) Elevated LP(a) at 318.9 nmol/L  PLAN: 1.   Ms. Boggus has a mixed dyslipidemia with target LDL less than 70.  Her cholesterol remains well above this on 80 mg of atorvastatin.  She has an elevated LP(a) as well which is likely driving this and is not treated by statins, Zetia, lifestyle or exercise.  This is genetically determined and we discussed this at length today.  Besides having family tested options for therapy include PCSK9 inhibitors which would help her to reach target and give her potentially a 20 to 30% reduction in LP(a).  Zetia is unlikely to do this and I would recommend not pursuing that therapy.  Will reach out for prior authorization for PCSK9 inhibitor and if approved plan to repeat lipids and LP(a) in about 3 to 4 months and follow-up with me at that time.  Thanks again for the kind referral.  Pixie Casino, MD, FACC, [place] Director of the [facility]  [facility] Diplomate of the American Board of Clinical Lipidology Attending Cardiologist  Direct Dial: [phone]  Fax: [phone]  Website:  [email or web address] Osgood Tyner Codner 10/05/2022, 10:34 AM

## 2022-10-05 NOTE — Progress Notes (Signed)
Seen by nephrologist Dr. Osborne Casco on 09/27/2022. CKD 3B: Chronic minimally progressive with some fluctuations.  Most the arterionephrosclerosis with possible secondary FSGS.  Likely genetic predisposition given family history.  Consider use of ARB in the future. HTN, renal disease: Fairly well-controlled on HCTZ and hydralazine.  Consider ARB in the future.  Norvasc may be a good alternative to hydralazine which is multiple times a day.  Labs: Urine with trace protein Creatinine 1.61/GFR 35/potassium 3.3/alkaline phosphatase 163 Magnesium 2.1 Phosphorus 2.7 H/H 14.1/41.6 Iron 62/ferritin 57/TIBC 251 Intact PTH 83 SPEP pattern appears unremarkable.  Evidence of monoclonal protein is not apparent

## 2022-10-06 ENCOUNTER — Other Ambulatory Visit: Payer: Self-pay

## 2022-10-06 MED ORDER — REPATHA SURECLICK 140 MG/ML ~~LOC~~ SOAJ
1.0000 mL | SUBCUTANEOUS | 3 refills | Status: DC
  Filled 2022-10-06 – 2022-12-30 (×2): qty 6, 84d supply, fill #0
  Filled 2022-12-30 – 2023-04-13 (×2): qty 6, 84d supply, fill #1
  Filled 2023-06-30 – 2023-07-06 (×2): qty 6, 84d supply, fill #2

## 2022-10-06 NOTE — Addendum Note (Signed)
Addended by: Fidel Levy on: 10/06/2022 04:04 PM   Modules accepted: Orders

## 2022-10-06 NOTE — Telephone Encounter (Addendum)
Applied for Repatha PA  Key: [id number]'[id number]'$   Approved through 04/07/2023

## 2022-10-07 ENCOUNTER — Ambulatory Visit (INDEPENDENT_AMBULATORY_CARE_PROVIDER_SITE_OTHER): Payer: Medicare Other | Admitting: Internal Medicine

## 2022-10-07 ENCOUNTER — Encounter: Payer: Self-pay | Admitting: Internal Medicine

## 2022-10-07 ENCOUNTER — Other Ambulatory Visit: Payer: Self-pay

## 2022-10-07 VITALS — BP 118/78 | HR 64 | Resp 16

## 2022-10-07 DIAGNOSIS — H1013 Acute atopic conjunctivitis, bilateral: Secondary | ICD-10-CM

## 2022-10-07 DIAGNOSIS — L501 Idiopathic urticaria: Secondary | ICD-10-CM | POA: Diagnosis not present

## 2022-10-07 DIAGNOSIS — J302 Other seasonal allergic rhinitis: Secondary | ICD-10-CM

## 2022-10-07 DIAGNOSIS — Z91013 Allergy to seafood: Secondary | ICD-10-CM | POA: Diagnosis not present

## 2022-10-07 DIAGNOSIS — J3089 Other allergic rhinitis: Secondary | ICD-10-CM

## 2022-10-07 MED ORDER — FAMOTIDINE 20 MG PO TABS
20.0000 mg | ORAL_TABLET | Freq: Two times a day (BID) | ORAL | 5 refills | Status: DC | PRN
  Filled 2022-10-07: qty 60, 30d supply, fill #0

## 2022-10-07 MED ORDER — FEXOFENADINE HCL 180 MG PO TABS
180.0000 mg | ORAL_TABLET | Freq: Two times a day (BID) | ORAL | 5 refills | Status: DC | PRN
  Filled 2022-10-07: qty 60, 30d supply, fill #0

## 2022-10-07 MED ORDER — FLUTICASONE PROPIONATE 50 MCG/ACT NA SUSP
2.0000 | Freq: Every day | NASAL | 5 refills | Status: DC
  Filled 2022-10-07: qty 16, 30d supply, fill #0

## 2022-10-07 NOTE — Patient Instructions (Addendum)
Allergic Rhinitis Allergic Conjunctivitis  - Positive skin test 09/2022: dust mite, mold, tree - Avoidance measures discussed. - Use nasal saline rinses before nose sprays such as with Neilmed Sinus Rinse.  Use distilled water.   - Use Flonase 2 sprays each nostril daily. Aim upward and outward.   Chronic Idiopathic Urticaria/Angioedema (Hives/Swelling) Chronic Kidney Disease, Improving  - At this time etiology of hives and swelling is unknown.  Hives can be caused by a variety of different triggers including illness/infection, foods, medications, stings, exercise, pressure, vibrations, extremes of temperature to name a few however majority of the time there is no identifiable trigger. - We will obtain bloodwork today to rule out certain triggers. Recent labwork in 2023: normal TSH, mild elevated Hgb on CBC, elevated sCr and AlkP with CKD.  -Stop Zyrtec (Cetirizine). Start Allegra '180mg'$  daily.   -If hives swelling reoccur, increase to Allegra '180mg'$  twice daily.   -If no improvement in 2-3 days, add Pepcid '20mg'$  twice daily and continue Allegra '180mg'$  twice daily. - Will monitor for side effects with high dose anti-histamines with CKD.    Food allergy:  - please strictly avoid fish and shellfish.  SPT positive 09/2022 to fish but negative to shellfish.  She has GI issues with both so has no interest in reintroduction.  - for SKIN only reaction, okay to take Benadryl '25mg'$  capsules every 6 hours - for SKIN + ANY additional symptoms, OR IF concern for LIFE THREATENING reaction = Epipen Autoinjector EpiPen 0.3 mg. - If using Epinephrine autoinjector, call 911 or go to the ER.    ALLERGEN AVOIDANCE MEASURES   Dust Mites Use central air conditioning and heat; and change the filter monthly.  Pleated filters work better than mesh filters.  Electrostatic filters may also be used; wash the filter monthly.  Window air conditioners may be used, but do not clean the air as well as a central air  conditioner.  Change or wash the filter monthly. Keep windows closed.  Do not use attic fans.   Encase the mattress, box springs and pillows with zippered, dust proof covers. Wash the bed linens in hot water weekly.   Remove carpet, especially from the bedroom. Remove stuffed animals, throw pillows, dust ruffles, heavy drapes and other items that collect dust from the bedroom. Do not use a humidifier.   Use wood, vinyl or leather furniture instead of cloth furniture in the bedroom. Keep the indoor humidity at 30 - 40%.  Monitor with a humidity gauge.  Molds - Indoor avoidance Use air conditioning to reduce indoor humidity.  Do not use a humidifier. Keep indoor humidity at 30 - 40%.  Use a dehumidifier if needed. In the bathroom use an exhaust fan or open a window after showering.  Wipe down damp surfaces after showering.  Clean bathrooms with a mold-killing solution (diluted bleach, or products like Tilex, etc) at least once a month. In the kitchen use an exhaust fan to remove steam from cooking.  Throw away spoiled foods immediately, and empty garbage daily.  Empty water pans below self-defrosting refrigerators frequently. Vent the clothes dryer to the outside. Limit indoor houseplants; mold grows in the dirt.  No houseplants in the bedroom. Remove carpet from the bedroom. Encase the mattress and box springs with a zippered encasing.  Molds - Outdoor avoidance Avoid being outside when the grass is being mowed, or the ground is tilled. Avoid playing in leaves, pine straw, hay, etc.  Dead plant materials contain mold. Avoid going  into barns or grain storage areas. Remove leaves, clippings and compost from around the home.  Pollen Avoidance Pollen levels are highest during the mid-day and afternoon.  Consider this when planning outdoor activities. Avoid being outside when the grass is being mowed, or wear a mask if the pollen-allergic person must be the one to mow the grass. Keep the  windows closed to keep pollen outside of the home. Use an air conditioner to filter the air. Take a shower, wash hair, and change clothing after working or playing outdoors during pollen season.

## 2022-10-07 NOTE — Progress Notes (Signed)
FOLLOW UP Date of Service/Encounter:  10/07/22   Subjective:  Krystal Allen (DOB: 1956-06-07) is a 66 y.o. female who returns to the [facility] on 10/07/2022 for follow up for skin testing.   History obtained from: chart review and patient. Reports doing well since last visit. No hives or swelling since then.  Past Medical History: Past Medical History:  Diagnosis Date   Allergy    COPD (chronic obstructive pulmonary disease) (HCC)    Depression    GERD (gastroesophageal reflux disease)    Hernia, abdominal    History of chicken pox    Hyperlipidemia    Hypertension    Internal hemorrhoids with Grade 3 prolapse and bleeding 06/19/2013   Osteoarthritis    Personal history of colonic adenomas 06/25/2013    Objective:  BP 118/78   Pulse 64   Resp 16   SpO2 95%  There is no height or weight on file to calculate BMI. Physical Exam: GEN: alert, well developed HEENT: clear conjunctiva, MMM HEART: regular rate  LUNGS: no coughing, unlabored respiration SKIN: no rashes    Skin Testing:  Skin prick testing was placed, which includes aeroallergens/foods, histamine control, and saline control.  Verbal consent was obtained prior to placing test.  Patient tolerated procedure well.  Allergy testing results were read and interpreted by myself, documented by clinical staff. Adequate positive and negative control.  Positive results to:  Results discussed with patient/family.  Airborne Adult Perc - 10/07/22 0943     Time Antigen Placed 2353    Allergen Manufacturer Lavella Hammock    Location Back    Number of Test 59    Panel 1 Select    1. Control-Buffer 50% Glycerol Negative    2. Control-Histamine 1 mg/ml 3+    3. Albumin saline Negative    4. [employer] Negative    5. [place] Negative    6. Johnson Negative    7. Whitehawk Blue Negative    8. Meadow Fescue Negative    9. Perennial Rye Negative    10. Sweet Vernal Negative    11. Timothy Negative    12. Cocklebur  Negative    13. Burweed Marshelder Negative    14. Ragweed, short Negative    15. Ragweed, Giant Negative    16. Plantain,  English Negative    17. Lamb's Quarters Negative    18. Sheep Sorrell Negative    19. Rough Pigweed Negative    20. Marsh Elder, Rough Negative    21. Mugwort, Common Negative    22. Ash mix Negative    23. Birch mix Negative    24. Beech American Negative    25. Box, Elder Negative    26. Cedar, red Negative    27. Cottonwood, [place] Negative    28. Elm mix Negative    29. Hickory Negative    30. Maple mix 3+    31. Oak, [place] mix Negative    32. Pecan Pollen Negative    33. Pine mix Negative    34. Sycamore Eastern Negative    35. Watha, Black Pollen Negative    36. Alternaria alternata Negative    37. Cladosporium Herbarum Negative    38. Aspergillus mix Negative    39. Penicillium mix Negative    40. Bipolaris sorokiniana (Helminthosporium) Negative    41. Drechslera spicifera (Curvularia) Negative    43. Fusarium moniliforme Negative    44. Aureobasidium pullulans (pullulara) Negative    45. Rhizopus oryzae  Negative    46. Botrytis cinera Negative    47. Epicoccum nigrum Negative    48. Phoma betae Negative    49. Candida Albicans Negative    50. Trichophyton mentagrophytes 2+    51. Mite, D Farinae  5,000 AU/ml 3+    52. Mite, D Pteronyssinus  5,000 AU/ml 3+    53. Cat Hair 10,000 BAU/ml Negative    54.  Dog Epithelia Negative    55. Mixed Feathers Negative    56. Horse Epithelia Negative    57. Cockroach, German Negative    58. Mouse Negative    59. Tobacco Leaf Negative             Intradermal - 10/07/22 1051     Time Antigen Placed 1051    Allergen Manufacturer Greer    Location Arm    Number of Test 13    Intradermal Select    Control Negative    Guatemala Negative    Johnson Negative    7 Grass Negative    Ragweed mix Negative    Weed mix Negative    Mold 1 Negative    Mold 2 Negative    Mold 3 Negative    Mold 4  Negative    Cat Negative    Dog Negative    Cockroach Negative             Food Adult Perc - 10/07/22 0900     Time Antigen Placed 2637    Allergen Manufacturer Lavella Hammock    Location Back    Number of allergen test 12    18. Catfish --   3X4   19. Bass --   4X5   20. Trout --   5X2   21. Tuna Negative    22. Salmon Negative    23. Flounder --   6X3   24. Codfish --   9X7   25. Shrimp Negative    26. Crab Negative    27. Lobster Negative    28. Oyster Negative    29. Scallops Negative              Assessment/Plan  Allergic Rhinitis Allergic Conjunctivitis  - Positive skin test 09/2022: dust mite, mold, tree - Avoidance measures discussed. - Use nasal saline rinses before nose sprays such as with Neilmed Sinus Rinse.  Use distilled water.   - Use Flonase 2 sprays each nostril daily. Aim upward and outward.   Chronic Idiopathic Urticaria/Angioedema (Hives/Swelling) Chronic Kidney Disease, Improving  - At this time etiology of hives and swelling is unknown.  Hives can be caused by a variety of different triggers including illness/infection, foods, medications, stings, exercise, pressure, vibrations, extremes of temperature to name a few however majority of the time there is no identifiable trigger. - We will obtain bloodwork today to rule out certain triggers. Recent labwork in 2023: normal TSH, mild elevated Hgb on CBC, elevated sCr and AlkP with CKD.  -Stop Zyrtec (Cetirizine). Start Allegra '180mg'$  daily.   -If hives swelling reoccur, increase to Allegra '180mg'$  twice daily.   -If no improvement in 2-3 days, add Pepcid '20mg'$  twice daily and continue Allegra '180mg'$  twice daily. - Will monitor for side effects with high dose anti-histamines with CKD.    Food allergy:  - please strictly avoid fish and shellfish.  SPT positive 09/2022 to fish but negative to shellfish.  She has GI issues with both so has no interest in reintroduction.  - for SKIN only reaction,  okay to take  Benadryl '25mg'$  capsules every 6 hours - for SKIN + ANY additional symptoms, OR IF concern for LIFE THREATENING reaction = Epipen Autoinjector EpiPen 0.3 mg. - If using Epinephrine autoinjector, call 911 or go to the ER.   Return in about 6 weeks (around 11/18/2022). Harlon Flor, MD  [facility] [place] [facility] [place]

## 2022-10-10 ENCOUNTER — Telehealth: Payer: Self-pay | Admitting: *Deleted

## 2022-10-10 NOTE — Telephone Encounter (Addendum)
Right Heart Cath  scheduled at [facility] for: Wednesday October 12, 2022 11:30 AM  Arrival time and place: [facility] Main Entrance A at: 9:30 AM  Nothing to eat after midnight prior to procedure, clear liquids until 5 AM day of procedure.  Medication instructions: -Hold:  Lasix/HCTZ-AM of procedure  -Except hold medications usual morning medications can be taken with sips of water.   Confirmed patient has responsible adult to drive home post procedure and be with patient first 24 hours after arriving home.  Patient reports no new symptoms concerning for COVID-19 in the past 10 days.  Reviewed procedure instructions with patient.

## 2022-10-11 ENCOUNTER — Other Ambulatory Visit (HOSPITAL_COMMUNITY): Payer: Self-pay

## 2022-10-11 NOTE — Telephone Encounter (Signed)
Patient aware new arrival time for Right Heart Cath 10/12/22 is 12:30 PM and procedure time is 2:30 PM.

## 2022-10-11 NOTE — Telephone Encounter (Signed)
Left message to let patient know about time change for Right Heart Cath 10/12/22-pt now should arrive at 12:30 PM for 2:30 PM procedure.

## 2022-10-12 ENCOUNTER — Ambulatory Visit (HOSPITAL_COMMUNITY)
Admission: RE | Disposition: A | Discharge status: Home / Self Care | Payer: Self-pay | Source: Home / Self Care | Attending: Internal Medicine

## 2022-10-12 ENCOUNTER — Ambulatory Visit (HOSPITAL_COMMUNITY)
Admission: RE | Admit: 2022-10-12 | Discharge: 2022-10-12 | Disposition: A | Discharge status: Home / Self Care | Payer: Medicare Other | Attending: Internal Medicine | Admitting: Internal Medicine

## 2022-10-12 ENCOUNTER — Other Ambulatory Visit: Payer: Self-pay

## 2022-10-12 DIAGNOSIS — Z7982 Long term (current) use of aspirin: Secondary | ICD-10-CM | POA: Diagnosis not present

## 2022-10-12 DIAGNOSIS — J849 Interstitial pulmonary disease, unspecified: Secondary | ICD-10-CM | POA: Diagnosis not present

## 2022-10-12 DIAGNOSIS — R7303 Prediabetes: Secondary | ICD-10-CM | POA: Diagnosis not present

## 2022-10-12 DIAGNOSIS — N183 Chronic kidney disease, stage 3 unspecified: Secondary | ICD-10-CM | POA: Diagnosis not present

## 2022-10-12 DIAGNOSIS — I2510 Atherosclerotic heart disease of native coronary artery without angina pectoris: Secondary | ICD-10-CM | POA: Insufficient documentation

## 2022-10-12 DIAGNOSIS — Z6841 Body Mass Index (BMI) 40.0 and over, adult: Secondary | ICD-10-CM | POA: Insufficient documentation

## 2022-10-12 DIAGNOSIS — E785 Hyperlipidemia, unspecified: Secondary | ICD-10-CM | POA: Insufficient documentation

## 2022-10-12 DIAGNOSIS — I700 Atherosclerosis of aorta: Secondary | ICD-10-CM | POA: Insufficient documentation

## 2022-10-12 DIAGNOSIS — Z79899 Other long term (current) drug therapy: Secondary | ICD-10-CM | POA: Insufficient documentation

## 2022-10-12 DIAGNOSIS — E7841 Elevated Lipoprotein(a): Secondary | ICD-10-CM | POA: Diagnosis not present

## 2022-10-12 DIAGNOSIS — J449 Chronic obstructive pulmonary disease, unspecified: Secondary | ICD-10-CM | POA: Insufficient documentation

## 2022-10-12 DIAGNOSIS — R0609 Other forms of dyspnea: Secondary | ICD-10-CM

## 2022-10-12 DIAGNOSIS — I129 Hypertensive chronic kidney disease with stage 1 through stage 4 chronic kidney disease, or unspecified chronic kidney disease: Secondary | ICD-10-CM | POA: Insufficient documentation

## 2022-10-12 DIAGNOSIS — Z9981 Dependence on supplemental oxygen: Secondary | ICD-10-CM | POA: Diagnosis not present

## 2022-10-12 HISTORY — PX: RIGHT HEART CATH: CATH118263

## 2022-10-12 LAB — POCT I-STAT EG7
Acid-Base Excess: 1 mmol/L (ref 0.0–2.0)
Acid-Base Excess: 1 mmol/L (ref 0.0–2.0)
Bicarbonate: 25.8 mmol/L (ref 20.0–28.0)
Bicarbonate: 25.8 mmol/L (ref 20.0–28.0)
Calcium, Ion: 1.16 mmol/L (ref 1.15–1.40)
Calcium, Ion: 1.19 mmol/L (ref 1.15–1.40)
HCT: 40 % (ref 36.0–46.0)
HCT: 40 % (ref 36.0–46.0)
Hemoglobin: 13.6 g/dL (ref 12.0–15.0)
Hemoglobin: 13.6 g/dL (ref 12.0–15.0)
O2 Saturation: 64 %
O2 Saturation: 64 %
Potassium: 3 mmol/L — ABNORMAL LOW (ref 3.5–5.1)
Potassium: 3 mmol/L — ABNORMAL LOW (ref 3.5–5.1)
Sodium: 142 mmol/L (ref 135–145)
Sodium: 143 mmol/L (ref 135–145)
TCO2: 27 mmol/L (ref 22–32)
TCO2: 27 mmol/L (ref 22–32)
pCO2, Ven: 41.1 mmHg — ABNORMAL LOW (ref 44–60)
pCO2, Ven: 43.2 mmHg — ABNORMAL LOW (ref 44–60)
pH, Ven: 7.385 (ref 7.25–7.43)
pH, Ven: 7.406 (ref 7.25–7.43)
pO2, Ven: 33 mmHg (ref 32–45)
pO2, Ven: 34 mmHg (ref 32–45)

## 2022-10-12 LAB — BASIC METABOLIC PANEL
Anion gap: 9 (ref 5–15)
BUN: 16 mg/dL (ref 8–23)
CO2: 28 mmol/L (ref 22–32)
Calcium: 8.9 mg/dL (ref 8.9–10.3)
Chloride: 104 mmol/L (ref 98–111)
Creatinine, Ser: 1.43 mg/dL — ABNORMAL HIGH (ref 0.44–1.00)
GFR, Estimated: 40 mL/min — ABNORMAL LOW (ref >60)
Glucose, Bld: 107 mg/dL — ABNORMAL HIGH (ref 70–99)
Potassium: 3.8 mmol/L (ref 3.5–5.1)
Sodium: 141 mmol/L (ref 135–145)

## 2022-10-12 SURGERY — RIGHT HEART CATH

## 2022-10-12 MED ORDER — SODIUM CHLORIDE 0.9 % IV SOLN: INTRAVENOUS | Status: DC

## 2022-10-12 MED ORDER — SODIUM CHLORIDE 0.9% FLUSH: 3.0000 mL | INTRAVENOUS | Status: DC | PRN

## 2022-10-12 MED ORDER — SODIUM CHLORIDE 0.9% FLUSH: 3.0000 mL | Freq: Two times a day (BID) | INTRAVENOUS | Status: DC

## 2022-10-12 MED ORDER — ONDANSETRON HCL 4 MG/2ML IJ SOLN: 4.0000 mg | Freq: Four times a day (QID) | INTRAMUSCULAR | Status: DC | PRN

## 2022-10-12 MED ORDER — LABETALOL HCL 5 MG/ML IV SOLN: 10.0000 mg | INTRAVENOUS | Status: DC | PRN

## 2022-10-12 MED ORDER — HEPARIN (PORCINE) IN NACL 1000-0.9 UT/500ML-% IV SOLN
INTRAVENOUS | Status: DC | PRN
  Administered 2022-10-12: 500 mL

## 2022-10-12 MED ORDER — ACETAMINOPHEN 325 MG PO TABS: 650.0000 mg | ORAL_TABLET | ORAL | Status: DC | PRN

## 2022-10-12 MED ORDER — HYDRALAZINE HCL 20 MG/ML IJ SOLN: 10.0000 mg | INTRAMUSCULAR | Status: DC | PRN

## 2022-10-12 MED ORDER — LIDOCAINE HCL (PF) 1 % IJ SOLN
INTRAMUSCULAR | Status: AC
  Filled 2022-10-12: qty 30

## 2022-10-12 MED ORDER — SODIUM CHLORIDE 0.9 % IV SOLN: 250.0000 mL | INTRAVENOUS | Status: DC | PRN

## 2022-10-12 MED ORDER — FENTANYL CITRATE (PF) 100 MCG/2ML IJ SOLN
INTRAMUSCULAR | Status: AC
  Filled 2022-10-12: qty 2

## 2022-10-12 MED ORDER — MIDAZOLAM HCL 2 MG/2ML IJ SOLN
INTRAMUSCULAR | Status: AC
  Filled 2022-10-12: qty 2

## 2022-10-12 MED ORDER — FENTANYL CITRATE (PF) 100 MCG/2ML IJ SOLN
INTRAMUSCULAR | Status: DC | PRN
  Administered 2022-10-12 (×2): 25 ug via INTRAVENOUS

## 2022-10-12 MED ORDER — MIDAZOLAM HCL 2 MG/2ML IJ SOLN
INTRAMUSCULAR | Status: DC | PRN
  Administered 2022-10-12 (×2): 1 mg via INTRAVENOUS

## 2022-10-12 SURGICAL SUPPLY — 7 items
CATH BALLN WEDGE 5F 110CM (CATHETERS) IMPLANT
GUIDEWIRE .025 260CM (WIRE) IMPLANT
SHEATH GLIDE SLENDER 4/5FR (SHEATH) IMPLANT
SHEATH PINNACLE 5F 10CM (SHEATH) IMPLANT
SHEATH PINNACLE 7F 10CM (SHEATH) IMPLANT
SHEATH PROBE COVER 6X72 (BAG) IMPLANT
WIRE MICRO SET SILHO 5FR 7 (SHEATH) IMPLANT

## 2022-10-12 NOTE — Interval H&P Note (Signed)
History and Physical Interval Note:  10/12/2022 1:05 PM  Krystal Allen  has presented today for surgery, with the diagnosis of disnea.  The various methods of treatment have been discussed with the patient and family. After consideration of risks, benefits and other options for treatment, the patient has consented to  Procedure(s): RIGHT HEART CATH (N/A) as a surgical intervention.  The patient's history has been reviewed, patient examined, no change in status, stable for surgery.  I have reviewed the patient's chart and labs.  Questions were answered to the patient's satisfaction.     [name]

## 2022-10-13 ENCOUNTER — Encounter (HOSPITAL_COMMUNITY): Payer: Self-pay | Admitting: Internal Medicine

## 2022-10-13 MED FILL — Lidocaine HCl Local Preservative Free (PF) Inj 1%: INTRAMUSCULAR | Qty: 60 | Status: AC

## 2022-10-13 MED FILL — Lidocaine HCl Local Preservative Free (PF) Inj 1%: INTRAMUSCULAR | Qty: 30 | Status: AC

## 2022-10-14 LAB — ALPHA-GAL PANEL
Allergen Lamb IgE: <0.1 kU/L
Beef IgE: <0.1 kU/L
IgE (Immunoglobulin E), Serum: 123 IU/mL (ref 6–495)
O215-IgE Alpha-Gal: <0.1 kU/L
Pork IgE: <0.1 kU/L

## 2022-10-14 LAB — TRYPTASE: Tryptase: 15 ug/L — ABNORMAL HIGH (ref 2.2–13.2)

## 2022-10-14 LAB — CHRONIC URTICARIA: cu index: 7.7 (ref <10)

## 2022-10-14 LAB — ANA: Anti Nuclear Antibody (ANA): NEGATIVE

## 2022-10-17 ENCOUNTER — Encounter: Payer: Self-pay | Admitting: Internal Medicine

## 2022-10-17 ENCOUNTER — Other Ambulatory Visit: Payer: Self-pay

## 2022-10-17 ENCOUNTER — Ambulatory Visit: Payer: Medicare Other | Attending: Internal Medicine | Admitting: Internal Medicine

## 2022-10-17 DIAGNOSIS — N1832 Chronic kidney disease, stage 3b: Secondary | ICD-10-CM | POA: Diagnosis not present

## 2022-10-17 DIAGNOSIS — C966 Unifocal Langerhans-cell histiocytosis: Secondary | ICD-10-CM | POA: Diagnosis not present

## 2022-10-17 DIAGNOSIS — E782 Mixed hyperlipidemia: Secondary | ICD-10-CM

## 2022-10-17 DIAGNOSIS — R7303 Prediabetes: Secondary | ICD-10-CM

## 2022-10-17 DIAGNOSIS — J449 Chronic obstructive pulmonary disease, unspecified: Secondary | ICD-10-CM | POA: Diagnosis not present

## 2022-10-17 LAB — POCT GLYCOSYLATED HEMOGLOBIN (HGB A1C): HbA1c, POC (controlled diabetic range): 6.4 % (ref 0.0–7.0)

## 2022-10-17 NOTE — Patient Instructions (Signed)

## 2022-10-17 NOTE — Progress Notes (Signed)
Patient ID: Krystal Allen, female    DOB: [birth year]  MRN: [id number]  CC: Follow-up (Concern about glucose-recent lab shows 107. /Pt interested in ozempic./Waking up "feeling like there is something in throat"./Already received flu vax this season.)   Subjective: Krystal Allen is a 65 y.o. female who presents for chronic ds management Her concerns today include:  Pt with hx of HTN, HL, aortic atherosclerosis, COPD, ILD (pulmonary Langerhans' cell cell histiocytosis), nocturnal hypoxia followed by pulmonary, GERD, CKD 2-3, preDM/obesity, former tob dep, DJD c-spine, spinal stenosis, stress incont, adenomatous colon polyp.     Obesity/PreDM:  Results for orders placed or performed in visit on 10/17/22  POCT glycosylated hemoglobin (Hb A1C)  Result Value Ref Range   Hemoglobin A1C     HbA1c POC (<> result, manual entry)     HbA1c, POC (prediabetic range)     HbA1c, POC (controlled diabetic range) 6.4 0.0 - 7.0 %  On last visit, we increased phentermine to the highest dose which is 37.5 mg daily to see if it would help in curbing her appetite.  Patient states she had just finished her bottle of the 30 mg tablets a week ago.  So she just started 37.5 mg tablets 1 week ago.  She feels it has helped in curbing her appetite.  Doing better with decreased consumption of junk snacks.  However she tells me that she plans to eat a lot over the Christmas holiday and that she is just being honest with me about it. Wants to get on Ozempic because recent BS on chem was 107. Was going to gym 3x/wk but had stop several months ago.  Plans to restart.  HL: Her cardiologist did right heart catheterization 10/03/2022 or spot of evaluation for dyspnea.  She was found to have normal filling pressures.  Conclusion was that dyspnea seen to be not related to elevated filling pressures.   Referred to Dr. Debara Pickett for HL with LDL 107 despite being on Lipitor 80 mg and Ca score 24.9.  He recommended starting her on  Repatha.  Just received it in the mail but has not started it as yet.  Will take tomorrow.   Pt had question about which med to stop.  Thinks it was the Zetia.  On lipitor 80 mg  COPD/Langerhans:  has not used her O2 in a while.  Feels her breathing is good. Using Memory Dance and Spiriva Last saw her pulmonologist Dr. Rayann Heman 03/2022 and was suppose to f/u in 4 mths but pt states no one called her. She complains of waking up in the mornings with thick phlegm in her throat usually yellowish in color which she is able to cough up.  She states this has been going on for a while.  She has not had any fever.  No increased cough  Recurrent Hives:  saw allergist Dr. Posey Pronto.  She did workup.  Etiology for recurrent hives and swelling is unknown.  Advised to change Zyrtec to Allegra.  Patient has an EpiPen..  Since last visit with me, she also saw Dr. Osborne Casco, nephrologist.  on 09/27/2022. CKD 3B: Chronic minimally progressive with some fluctuations.  Most the arterionephrosclerosis with possible secondary FSGS.  Likely genetic predisposition given family history.  Consider use of ARB in the future. HTN, renal disease: Fairly well-controlled on HCTZ and hydralazine.  Consider ARB in the future.  Norvasc may be a good alternative to hydralazine which is multiple times a day.  Labs: Urine with trace  protein Creatinine 1.61/GFR 35/potassium 3.3/alkaline phosphatase 163 Magnesium 2.1 Phosphorus 2.7 H/H 14.1/41.6 Iron 62/ferritin 57/TIBC 251 Intact PTH 83 SPEP pattern appears unremarkable.  Evidence of monoclonal protein is not apparent    HM:  declines Shingrix.  Will consider COVID booster  Patient Active Problem List   Diagnosis Date Noted   Morbid obesity ([place]) 03/09/2022   Seasonal allergies 02/07/2022   Mastitis, right, acute 12/23/2021   Influenza vaccine needed 09/14/2020   Tobacco dependence 05/12/2020   Aortic atherosclerosis ([place]) 05/12/2020   Adenomatous polyp of colon 05/12/2020    Langerhan's cell histiocytosis ([place]) 01/26/2018   Cigarette smoker 01/26/2018   Osteoarthritis of cervical spine 12/22/2017   Prediabetes 12/22/2017   Pain of left calf 04/06/2017   Centrilobular emphysema ([place]) 02/23/2016   Obesity (BMI 30-39.9) 01/15/2016   Bilateral shoulder pain 01/15/2016   Esophageal reflux 01/15/2016   Tobacco abuse 05/13/2015   Lung nodule 08/14/2014   Impaired fasting glucose 08/14/2014   Hyperlipidemia 08/14/2014   Personal history of colonic adenomas 06/25/2013   HTN (hypertension) 05/20/2013     Current Outpatient Medications on File Prior to Visit  Medication Sig Dispense Refill   acetaminophen (TYLENOL 8 HOUR) 650 MG CR tablet Take 1 tablet (650 mg total) by mouth 2 (two) times daily as needed for pain. 60 tablet 5   albuterol (PROAIR HFA) 108 (90 Base) MCG/ACT inhaler Inhale 2 puffs into the lungs every 4 (four) hours as needed for wheezing or shortness of breath. 8.5 g 2   albuterol (PROVENTIL) (2.5 MG/3ML) 0.083% nebulizer solution TAKE 3 MLS BY NEBULIZATION EVERY 6 (SIX) HOURS AS NEEDED FOR WHEEZING OR SHORTNESS OF BREATH. 90 mL 1   aspirin EC 81 MG tablet Take 1 tablet (81 mg total) by mouth daily. 100 tablet 1   atorvastatin (LIPITOR) 80 MG tablet Take 1 tablet (80 mg total) by mouth daily. 90 tablet 3   cetirizine (ZYRTEC) 10 MG tablet Take 1 tablet (10 mg total) by mouth daily. 30 tablet 11   diphenhydrAMINE (BENADRYL) 25 mg capsule Take 25-50 mg by mouth every 6 (six) hours as needed for itching or allergies.     EPINEPHrine 0.3 mg/0.3 mL IJ SOAJ injection Inject 0.3 mg into the muscle as needed for anaphylaxis. 1 each 2   Evolocumab (REPATHA SURECLICK) 188 MG/ML SOAJ Inject 140 mg into the skin every 14 (fourteen) days. 6 mL 3   ezetimibe (ZETIA) 10 MG tablet Take 1 tablet (10 mg total) by mouth daily. 90 tablet 3   famotidine (PEPCID) 20 MG tablet Take 1 tablet (20 mg total) by mouth 2 (two) times daily as needed (hives). 60 tablet 5    fexofenadine (ALLEGRA ALLERGY) 180 MG tablet Take 1 tablet (180 mg total) by mouth 2 (two) times daily as needed (hives). 60 tablet 5   fluticasone (FLONASE) 50 MCG/ACT nasal spray Place 2 sprays into both nostrils daily. 16 g 5   fluticasone furoate-vilanterol (BREO ELLIPTA) 200-25 MCG/ACT AEPB Inhale 1 puff into the lungs once daily. 60 each 5   furosemide (LASIX) 20 MG tablet Take 1 tablet (20 mg total) by mouth daily. 90 tablet 3   hydrALAZINE (APRESOLINE) 10 MG tablet Take 1 tablet (10 mg total) by mouth 2 (two) times daily. 60 tablet 5   hydrochlorothiazide (HYDRODIURIL) 25 MG tablet Take 1 tablet (25 mg total) by mouth daily. 90 tablet 1   hydrOXYzine (ATARAX) 25 MG tablet Take 1 tablet (25 mg total) by mouth every 8 (eight) hours  as needed for up to 15 doses for itching. 15 tablet 0   Olopatadine HCl 0.2 % SOLN Apply 1 drop to affected eyes daily as directed. (Patient taking differently: Place 1 drop into both eyes daily as needed (allergies).) 2.5 mL 1   omeprazole (PRILOSEC) 20 MG capsule Take 1 capsule (20 mg total) by mouth daily. 90 capsule 3   phentermine 37.5 MG capsule Take 1 capsule (37.5 mg total) by mouth every morning. 30 capsule 1   Respiratory Therapy Supplies (FLUTTER) DEVI Use as directed 1 each 0   sodium chloride (OCEAN) 0.65 % nasal spray Place 2 sprays into both nostrils every 4 (four) hours. (Patient taking differently: Place 2 sprays into the nose every 4 (four) hours as needed for congestion.) 44 mL 0   Tiotropium Bromide Monohydrate (SPIRIVA RESPIMAT) 2.5 MCG/ACT AERS Inhale 2 puffs into the lungs daily. 4 g 5   Elastic Bandages & Supports (MEDICAL COMPRESSION STOCKINGS) MISC R60.0 (Patient not taking: Reported on 10/17/2022) 1 each 0   No current facility-administered medications on file prior to visit.    Allergies  Allergen Reactions   Sulfa Antibiotics Anaphylaxis and Hives   Chantix [Varenicline Tartrate]     Bad dreams   Fish Allergy Other (See Comments)     Severe stomach cramps    Hygroton [Chlorthalidone]    Penicillins Other (See Comments)    Has patient had a PCN reaction causing immediate rash, facial/tongue/throat swelling, SOB or lightheadedness with hypotension: No Has patient had a PCN reaction causing severe rash involving mucus membranes or skin necrosis: No Has patient had a PCN reaction that required hospitalization No Has patient had a PCN reaction occurring within the last 10 years: No If all of the above answers are "NO", then may proceed with Cephalosporin use.  Welts    Latex Rash and Other (See Comments)    Dry skin    Social History   Socioeconomic History   Marital status: Married    Spouse name: Not on file   Number of children: Not on file   Years of education: 12+   Highest education level: Not on file  Occupational History   Occupation: PCA    Employer: [employer]  Tobacco Use   Smoking status: Former    Packs/day: 0.50    Years: 40.00    Total pack years: 20.00    Types: Cigarettes    Quit date: 07/2021    Years since quitting: 1.2    Passive exposure: Never   Smokeless tobacco: Never   Tobacco comments:    Quit [id number]  Vaping Use   Vaping Use: Former  Substance and Sexual Activity   Alcohol use: Yes    Comment: occasional   Drug use: No   Sexual activity: Not Currently  Other Topics Concern   Not on file  Social History Narrative   Regular exercise-no Caffeine Use-   Social Determinants of Health   Financial Resource Strain: Low Risk  (08/19/2022)   Overall Financial Resource Strain (CARDIA)    Difficulty of Paying Living Expenses: Not hard at all  Food Insecurity: No Food Insecurity (08/19/2022)   Hunger Vital Sign    Worried About Running Out of Food in the Last Year: Never true    [place] in the Last Year: Never true  Transportation Needs: No Transportation Needs (08/19/2022)   PRAPARE - Hydrologist (Medical): No    Lack of Transportation  (Non-Medical):  No  Physical Activity: Inactive (08/19/2022)   Exercise Vital Sign    Days of Exercise per Week: 1 day    Minutes of Exercise per Session: 0 min  Stress: No Stress Concern Present (08/19/2022)   [place]    Feeling of Stress : Not at all  Social Connections: Socially Isolated (08/19/2022)   Social Connection and Isolation Panel [NHANES]    Frequency of Communication with Friends and Family: More than three times a week    Frequency of Social Gatherings with Friends and Family: Three times a week    Attends Religious Services: Never    Active Member of Clubs or Organizations: No    Attends Archivist Meetings: Never    Marital Status: Separated  Intimate Partner Violence: Not At Risk (08/19/2022)   Humiliation, Afraid, Rape, and Kick questionnaire    Fear of Current or Ex-Partner: No    Emotionally Abused: No    Physically Abused: No    Sexually Abused: No    Family History  Problem Relation Age of Onset   Cancer Father        prostate   Hypertension Mother    Diabetes Mother    Hypertension Sister    Heart disease Maternal Uncle    Stroke Maternal Grandmother    Hypertension Maternal Grandmother    Hypertension Sister    Colon cancer Neg Hx    Stomach cancer Neg Hx    Rectal cancer Neg Hx    Colon polyps Neg Hx     Past Surgical History:  Procedure Laterality Date   ABDOMINAL HYSTERECTOMY  3yr ago   due to heavy bleeding and fibroids    COLONOSCOPY     HEMORRHOID BANDING  2014   HEMORRHOID SURGERY  [id number]   RIGHT HEART CATH N/A 10/12/2022   Procedure: RIGHT HEART CATH;  Surgeon: ThEarly OsmondMD;  [facility];  Service: Cardiovascular;  Laterality: N/A;   UMBILICAL HERNIA REPAIR  06/2015    ROS: Review of Systems Negative except as stated above  PHYSICAL EXAM: BP 124/80 (BP Location: Left Arm, Patient Position: Sitting, Cuff Size: Large)    Pulse 89   Temp 98.3 F (36.8 C) (Oral)   Ht '5\' 6"'$  (1.676 m)   Wt 267 lb (121.1 kg)   SpO2 97%   BMI 43.09 kg/m   Wt Readings from Last 3 Encounters:  10/17/22 267 lb (121.1 kg)  10/12/22 265 lb (120.2 kg)  10/05/22 267 lb (121.1 kg)    Physical Exam  General appearance - alert, well appearing, and in no distress Mental status - normal mood, behavior, speech, dress, motor activity, and thought processes Mouth - mucous membranes moist, pharynx normal without lesions Neck - supple, no significant adenopathy Chest - clear to auscultation, no wheezes, rales or rhonchi, symmetric air entry Heart - normal rate, regular rhythm, normal S1, S2, no murmurs, rubs, clicks or gallops Extremities -no lower extremity edema.      Latest Ref Rng & Units 10/12/2022    5:24 PM 10/12/2022    5:22 PM 10/12/2022    1:28 PM  CMP  Glucose 70 - 99 mg/dL   107   BUN 8 - 23 mg/dL   16   Creatinine 0.44 - 1.00 mg/dL   1.43   Sodium 135 - 145 mmol/L 143  142  141   Potassium 3.5 - 5.1 mmol/L 3.0  3.0  3.8  Chloride 98 - 111 mmol/L   104   CO2 22 - 32 mmol/L   28   Calcium 8.9 - 10.3 mg/dL   8.9    Lipid Panel     Component Value Date/Time   CHOL 185 08/16/2022 1117   TRIG 132 08/16/2022 1117   HDL 55 08/16/2022 1117   CHOLHDL 3.4 08/16/2022 1117   CHOLHDL 3.7 08/17/2021 0340   VLDL 12 08/17/2021 0340   LDLCALC 107 (H) 08/16/2022 1117   LDLDIRECT 190.1 04/16/2013 1531    CBC    Component Value Date/Time   WBC 6.4 10/03/2022 1120   WBC 7.3 02/01/2022 1559   RBC 4.92 10/03/2022 1120   RBC 5.45 (H) 02/01/2022 1559   HGB 13.6 10/12/2022 1724   HGB 14.4 10/03/2022 1120   HCT 40.0 10/12/2022 1724   HCT 41.8 10/03/2022 1120   PLT 260 10/03/2022 1120   MCV 85 10/03/2022 1120   MCH 29.3 10/03/2022 1120   MCH 29.0 02/01/2022 1559   MCHC 34.4 10/03/2022 1120   MCHC 32.6 02/01/2022 1559   RDW 17.9 (H) 10/03/2022 1120   LYMPHSABS 1.3 02/01/2022 1559   LYMPHSABS 2.2 02/10/2020 1000    MONOABS 0.3 02/01/2022 1559   EOSABS 0.0 02/01/2022 1559   EOSABS 0.2 02/10/2020 1000   BASOSABS 0.0 02/01/2022 1559   BASOSABS 0.1 02/10/2020 1000    ASSESSMENT AND PLAN:  1. Obesity, morbid, BMI 40.0-49.9 ([place]) Encouraged her to be mindful of the amount that she eats and what she is eating over the Christmas holiday. No significant weight loss so far with phentermine but she just started the highest dose 1 week ago Encouraged her to start exercising again as tolerated.  Gym membership is covered by her insurance.  She has plans to start going to the gym again 2-3 times a week. - POCT glycosylated hemoglobin (Hb A1C)  2. Prediabetes Still in the range for prediabetes but very close to becoming diabetic.  Advised that her insurance would not pay for Ozempic without diagnosis of diabetes.  Discussed needed changes in eating habits and trying to move more to prevent becoming diabetic. - POCT glycosylated hemoglobin (Hb A1C)  3. Langerhan's cell histiocytosis (HCC) Stable and followed by pulmonary.  I have resubmitted referral so that she can get back in with her pulmonologist. - Ambulatory referral to Pulmonology  4. Mixed hyperlipidemia Started on Repatha by cardiology.  I recommend that she send Dr. Debara Pickett a MyChart message to clarify whether she should still be on the atorvastatin and Zetia with the [place] or whether he just wanted her to stop the Zetia.  5. Chronic obstructive pulmonary disease, unspecified COPD type ([place]) Stable on current inhalers.  She has not had to use her O2.  I think the phlegm production in the morning is most likely due to her COPD. - Ambulatory referral to Pulmonology  6. Stage 3b chronic kidney disease ([place]) She has been plugged in with nephrology.  Knows to avoid NSAIDs.    Patient was given the opportunity to ask questions.  Patient verbalized understanding of the plan and was able to repeat key elements of the plan.   This documentation was  completed using Radio producer.  Any transcriptional errors are unintentional.  No orders of the defined types were placed in this encounter.    Requested Prescriptions    No prescriptions requested or ordered in this encounter    No follow-ups on file.  Karle Plumber, MD, FACP

## 2022-10-23 DIAGNOSIS — R0602 Shortness of breath: Secondary | ICD-10-CM | POA: Diagnosis not present

## 2022-10-25 ENCOUNTER — Other Ambulatory Visit: Payer: Self-pay

## 2022-10-25 ENCOUNTER — Other Ambulatory Visit: Payer: Self-pay | Admitting: Internal Medicine

## 2022-10-26 ENCOUNTER — Other Ambulatory Visit: Payer: Self-pay

## 2022-10-26 MED ORDER — EPINEPHRINE 0.3 MG/0.3ML IJ SOAJ
0.3000 mg | INTRAMUSCULAR | 2 refills | Status: DC | PRN
  Filled 2022-10-26: qty 2, 30d supply, fill #0
  Filled 2023-03-01: qty 2, 30d supply, fill #1

## 2022-11-03 ENCOUNTER — Encounter (HOSPITAL_BASED_OUTPATIENT_CLINIC_OR_DEPARTMENT_OTHER): Payer: Self-pay | Admitting: Pulmonary Disease

## 2022-11-03 ENCOUNTER — Ambulatory Visit (INDEPENDENT_AMBULATORY_CARE_PROVIDER_SITE_OTHER): Payer: Medicare Other | Admitting: Pulmonary Disease

## 2022-11-03 ENCOUNTER — Other Ambulatory Visit: Payer: Self-pay

## 2022-11-03 VITALS — BP 126/86 | HR 66 | Ht 66.0 in | Wt 266.0 lb

## 2022-11-03 DIAGNOSIS — J432 Centrilobular emphysema: Secondary | ICD-10-CM

## 2022-11-03 DIAGNOSIS — J8482 Adult pulmonary Langerhans cell histiocytosis: Secondary | ICD-10-CM | POA: Diagnosis not present

## 2022-11-03 DIAGNOSIS — G4734 Idiopathic sleep related nonobstructive alveolar hypoventilation: Secondary | ICD-10-CM

## 2022-11-03 MED ORDER — FLUTICASONE FUROATE-VILANTEROL 200-25 MCG/ACT IN AEPB
1.0000 | INHALATION_SPRAY | Freq: Every day | RESPIRATORY_TRACT | 5 refills | Status: DC
  Filled 2022-11-03: qty 60, 30d supply, fill #0
  Filled 2023-02-15: qty 60, 30d supply, fill #1
  Filled 2023-03-09 – 2023-03-14 (×2): qty 60, 30d supply, fill #2

## 2022-11-03 MED ORDER — SPIRIVA RESPIMAT 2.5 MCG/ACT IN AERS
2.0000 | INHALATION_SPRAY | Freq: Every day | RESPIRATORY_TRACT | 5 refills | Status: DC
  Filled 2022-11-03: qty 4, 30d supply, fill #0
  Filled 2023-01-21: qty 4, 30d supply, fill #1
  Filled 2023-02-15: qty 4, 30d supply, fill #2
  Filled 2023-03-09 – 2023-03-14 (×2): qty 4, 30d supply, fill #3

## 2022-11-03 NOTE — Addendum Note (Signed)
Addended by: Darliss Ridgel on: 11/03/2022 10:46 AM   Modules accepted: Orders

## 2022-11-03 NOTE — Progress Notes (Addendum)
Subjective:   PATIENT ID: Krystal Allen GENDER: female DOB: 02-11-1956, MRN: [id number]   HPI  Chief Complaint  Patient presents with   Follow-up    Hasn't worn ox in about 2 weeks states only wears it when needed    Reason for Visit: Follow-up  Ms. Krystal Allen is a 67 year old female former smoker with emphysema, chronic hypoxemic respiratory failure, Pulmonary Langerhans cell histiocytosis, hypertension, hyperlipidemia who presents follow-up.  She was hospitalized in October 2022 for COPD exacerbation secondary to influenza. She quit smoking since then and has done well. She does notice she is snacking more in place of her nicotine craving. She has shortness of breath with wheezing. Wheezing occurs at night sometimes. No chronic cough. Symptoms worsen with changes in weather and will use her rescue inhaler 1-2 a week. She is compliant with Breo 200 daily. She wears her oxygen when she feels short of breath. She checks her O2 levels which usually go down to 89%. She reports good quality of sleep >8 hours. She does snore. Her roommate does not report witnessed apnea or gasping. No morning headaches or fatigue.   04/15/22 Since our last visit she has been compliant with Breo and Spiriva and feels this is helping her. She has been wearing oxygen at night only when she thinks she needs it. Occasional shortness of breath. Sometimes coughing but this has improved. Wheezing with exertion. Does not feel like her albuterol inhaler is effective. Uses it every other with several puffs. No exacerbations in the last 8 months when she was hospitalized for the flu.  11/03/22 Since our last visit she is compliant with her Breo and Spiriva. She uses albuterol once a week when being outdoors. Weather changes including the cold care trigger wheezing. No limitations in ADLs but not exercising. Denies exacerbations requiring prednisone or antibiotics. She has not compliant with nightly oxygen. Has shortness  of breath with moderate exertion. She is working with her PCP to lose weight and currently on phentermine.   Social History: Former smoker. 1/2 ppd x 40 years [employer] x 33 years    Past Medical History:  Diagnosis Date   Allergy    COPD (chronic obstructive pulmonary disease) (HCC)    Depression    GERD (gastroesophageal reflux disease)    Hernia, abdominal    History of chicken pox    Hyperlipidemia    Hypertension    Internal hemorrhoids with Grade 3 prolapse and bleeding 06/19/2013   Osteoarthritis    Personal history of colonic adenomas 06/25/2013     Family History  Problem Relation Age of Onset   Cancer Father        prostate   Hypertension Mother    Diabetes Mother    Hypertension Sister    Heart disease Maternal Uncle    Stroke Maternal Grandmother    Hypertension Maternal Grandmother    Hypertension Sister    Colon cancer Neg Hx    Stomach cancer Neg Hx    Rectal cancer Neg Hx    Colon polyps Neg Hx      Social History   Occupational History   Occupation: PCA    Employer: [employer]  Tobacco Use   Smoking status: Former    Packs/day: 0.50    Years: 40.00    Total pack years: 20.00    Types: Cigarettes    Quit date: 07/2021    Years since quitting: 1.2    Passive exposure: Never   Smokeless tobacco:  Never   Tobacco comments:    Quit [id number]  Vaping Use   Vaping Use: Former  Substance and Sexual Activity   Alcohol use: Yes    Comment: occasional   Drug use: No   Sexual activity: Not Currently    Allergies  Allergen Reactions   Sulfa Antibiotics Anaphylaxis and Hives   Chantix [Varenicline Tartrate]     Bad dreams   Fish Allergy Other (See Comments)    Severe stomach cramps    Hygroton [Chlorthalidone]    Penicillins Other (See Comments)    Has patient had a PCN reaction causing immediate rash, facial/tongue/throat swelling, SOB or lightheadedness with hypotension: No Has patient had a PCN reaction causing severe rash involving mucus  membranes or skin necrosis: No Has patient had a PCN reaction that required hospitalization No Has patient had a PCN reaction occurring within the last 10 years: No If all of the above answers are "NO", then may proceed with Cephalosporin use.  Welts    Latex Rash and Other (See Comments)    Dry skin     Outpatient Medications Prior to Visit  Medication Sig Dispense Refill   acetaminophen (TYLENOL 8 HOUR) 650 MG CR tablet Take 1 tablet (650 mg total) by mouth 2 (two) times daily as needed for pain. 60 tablet 5   albuterol (PROAIR HFA) 108 (90 Base) MCG/ACT inhaler Inhale 2 puffs into the lungs every 4 (four) hours as needed for wheezing or shortness of breath. 8.5 g 2   albuterol (PROVENTIL) (2.5 MG/3ML) 0.083% nebulizer solution TAKE 3 MLS BY NEBULIZATION EVERY 6 (SIX) HOURS AS NEEDED FOR WHEEZING OR SHORTNESS OF BREATH. 90 mL 1   aspirin EC 81 MG tablet Take 1 tablet (81 mg total) by mouth daily. 100 tablet 1   atorvastatin (LIPITOR) 80 MG tablet Take 1 tablet (80 mg total) by mouth daily. 90 tablet 3   cetirizine (ZYRTEC) 10 MG tablet Take 1 tablet (10 mg total) by mouth daily. 30 tablet 11   diphenhydrAMINE (BENADRYL) 25 mg capsule Take 25-50 mg by mouth every 6 (six) hours as needed for itching or allergies.     Elastic Bandages & Supports (MEDICAL COMPRESSION STOCKINGS) MISC R60.0 1 each 0   EPINEPHrine 0.3 mg/0.3 mL IJ SOAJ injection Inject 0.3 mg into the muscle as needed for anaphylaxis. 2 each 2   Evolocumab (REPATHA SURECLICK) 622 MG/ML SOAJ Inject 140 mg into the skin every 14 (fourteen) days. 6 mL 3   ezetimibe (ZETIA) 10 MG tablet Take 1 tablet (10 mg total) by mouth daily. 90 tablet 3   famotidine (PEPCID) 20 MG tablet Take 1 tablet (20 mg total) by mouth 2 (two) times daily as needed (hives). 60 tablet 5   fexofenadine (ALLEGRA ALLERGY) 180 MG tablet Take 1 tablet (180 mg total) by mouth 2 (two) times daily as needed (hives). 60 tablet 5   fluticasone (FLONASE) 50 MCG/ACT  nasal spray Place 2 sprays into both nostrils daily. 16 g 5   fluticasone furoate-vilanterol (BREO ELLIPTA) 200-25 MCG/ACT AEPB Inhale 1 puff into the lungs once daily. 60 each 5   furosemide (LASIX) 20 MG tablet Take 1 tablet (20 mg total) by mouth daily. 90 tablet 3   hydrALAZINE (APRESOLINE) 10 MG tablet Take 1 tablet (10 mg total) by mouth 2 (two) times daily. 60 tablet 5   hydrochlorothiazide (HYDRODIURIL) 25 MG tablet Take 1 tablet (25 mg total) by mouth daily. 90 tablet 1   hydrOXYzine (ATARAX) 25 MG  tablet Take 1 tablet (25 mg total) by mouth every 8 (eight) hours as needed for up to 15 doses for itching. 15 tablet 0   Olopatadine HCl 0.2 % SOLN Apply 1 drop to affected eyes daily as directed. (Patient taking differently: Place 1 drop into both eyes daily as needed (allergies).) 2.5 mL 1   omeprazole (PRILOSEC) 20 MG capsule Take 1 capsule (20 mg total) by mouth daily. 90 capsule 3   phentermine 37.5 MG capsule Take 1 capsule (37.5 mg total) by mouth every morning. 30 capsule 1   Respiratory Therapy Supplies (FLUTTER) DEVI Use as directed 1 each 0   sodium chloride (OCEAN) 0.65 % nasal spray Place 2 sprays into both nostrils every 4 (four) hours. (Patient taking differently: Place 2 sprays into the nose every 4 (four) hours as needed for congestion.) 44 mL 0   Tiotropium Bromide Monohydrate (SPIRIVA RESPIMAT) 2.5 MCG/ACT AERS Inhale 2 puffs into the lungs daily. 4 g 5   No facility-administered medications prior to visit.    Review of Systems  Constitutional:  Negative for chills, diaphoresis, fever, malaise/fatigue and weight loss.  HENT:  Negative for congestion.   Respiratory:  Positive for wheezing. Negative for cough, hemoptysis, sputum production and shortness of breath.   Cardiovascular:  Negative for chest pain, palpitations and leg swelling.     Objective:   Vitals:   11/03/22 0959  BP: 126/86  Pulse: 66  SpO2: 95%  Weight: 266 lb (120.7 kg)  Height: '5\' 6"'$  (1.676 m)   SpO2: 95 % O2 Device: None (Room air)  Physical Exam: General: Well-appearing, no acute distress HENT: [place], AT Eyes: EOMI, no scleral icterus Respiratory: Clear to auscultation bilaterally.  No crackles, wheezing or rales Cardiovascular: RRR, -M/R/G, no JVD Extremities:-Edema,-tenderness Neuro: AAO x4, CNII-XII grossly intact Psych: Normal mood, normal affect  Data Reviewed:  Imaging: CT 12/15/17 - Mild emphysema. Numerous pulmonary nodules and thin walled cysts bilaterally. Larged lung nodule is 6 mm in the RUL CXR 08/19/21 - Normal. No infiltrate, effusion or edema. CT Coronary 03/31/22 - Visualized lung parenchyma with no pulmonary nodules, masses, infiltrate, effusion or pneumothorax. CXR 08/10/22 - Normal. No infiltrate effusion or edema  PFT: 01/26/21 FVC 2.99 (97%) FEV1 2.49 (103%) Ratio 79  TLC 88% DLCO 53% Interpretation: Normal spirometry and lung volumes. No significant BD response present. Moderately reduced gas exchange.  04/15/22 FVC 2.71 (91%) FEV1 2.06 (89%) Ratio 79 TLC 90% DLCO 60% Interpretation: Normal spirometry and lung volumes. Improved gas exchange to mildly reduced DLCO  Labs: CBC    Component Value Date/Time   WBC 6.4 10/03/2022 1120   WBC 7.3 02/01/2022 1559   RBC 4.92 10/03/2022 1120   RBC 5.45 (H) 02/01/2022 1559   HGB 13.6 10/12/2022 1724   HGB 14.4 10/03/2022 1120   HCT 40.0 10/12/2022 1724   HCT 41.8 10/03/2022 1120   PLT 260 10/03/2022 1120   MCV 85 10/03/2022 1120   MCH 29.3 10/03/2022 1120   MCH 29.0 02/01/2022 1559   MCHC 34.4 10/03/2022 1120   MCHC 32.6 02/01/2022 1559   RDW 17.9 (H) 10/03/2022 1120   LYMPHSABS 1.3 02/01/2022 1559   LYMPHSABS 2.2 02/10/2020 1000   MONOABS 0.3 02/01/2022 1559   EOSABS 0.0 02/01/2022 1559   EOSABS 0.2 02/10/2020 1000   BASOSABS 0.0 02/01/2022 1559   BASOSABS 0.1 02/10/2020 1000   Ambulatory O2   11/06/21 - Interpretation: Recommend 3L pulsed with exertion and sleep 04/15/22 - Interpretation: No  desaturations on  ambulation     Assessment & Plan:   Discussion: 67 year old female former smoker with emphysema, chronic hypoxemic respiratory failure, Pulmonary Langerhans cell histiocytosis, HTN, HLD who presents for follow-up.  Symptoms currently well controlled. Prior ONO 12/2021 demonstrated ongoing hypoxemia so I encourage wearing oxygen nightly.  Emphysema --CONTINUE Breo 200-25 mcg ONE puff ONCE a day. REFILL --CONTINUE Spiriva 2.5 mcg TWO puffs ONCE a day. REFILL --CONTINUE Albuterol AS NEEDED  Nocturnal Hypoxemia --CONTINUE oxygen 2L  via  nightly while sleeping --Will consider rechecking at next visit especially if you are able to make progress in your weight loss journey  Pulmonary Langerhans cell histiocytosis Mildly reduced DLCO --Improved DLCO --ORDER pulmonary function tests for 04/2023  Morbid obesity BMI 42 --Currently on phentermine with PCP --Discussed regular aerobic exercise five days. Start slow and work up to 20-30 minutes daily --Take small weights with you during your walks  Health Maintenance Immunization History  Administered Date(s) Administered   Fluad Quad(high Dose 65+) 08/16/2022   Influenza Split 07/10/2017   Influenza,inj,Quad PF,6+ Mos 08/14/2014, 09/14/2020, 09/13/2021   PFIZER Comirnaty(Gray Top)Covid-19 Tri-Sucrose Vaccine 01/25/2021   PFIZER(Purple Top)SARS-COV-2 Vaccination 05/12/2020, 06/02/2020   PNEUMOCOCCAL CONJUGATE-20 08/24/2021   Respiratory Syncytial Virus Vaccine,Recomb Aduvanted(Arexvy) 08/16/2022   Tdap 08/14/2014   CT Lung Screen- Enrolled  No orders of the defined types were placed in this encounter.  No orders of the defined types were placed in this encounter.   No follow-ups on file.  I have spent a total time of 30-minutes on the day of the appointment including chart review, data review, collecting history, coordinating care and discussing medical diagnosis and plan with the patient/family. Past medical  history, allergies, medications were reviewed. Pertinent imaging, labs and tests included in this note have been reviewed and interpreted independently by me.  Sanders, MD [facility] 11/03/2022 10:11 AM  Office Number [phone]

## 2022-11-03 NOTE — Patient Instructions (Signed)
  Emphysema --CONTINUE Breo 200-25 mcg ONE puff ONCE a day. REFILL --CONTINUE Spiriva 2.5 mcg TWO puffs ONCE a day. REFILL --CONTINUE Albuterol AS NEEDED  Nocturnal Hypoxemia --CONTINUE oxygen 2L  via [place] nightly while sleeping --Will consider rechecking at next visit especially if you are able to make progress in your weight loss journey  Pulmonary Langerhans cell histiocytosis Mildly reduced DLCO --Improved DLCO --ORDER pulmonary function tests for 04/2023  Morbid obesity BMI 42 --Currently on phentermine with PCP --Discussed regular aerobic exercise five days. Start slow and work up to 20-30 minutes daily --Take small weights with you during your walks  Follow-up with me in June with PFTs prior to visit

## 2022-11-07 ENCOUNTER — Other Ambulatory Visit: Payer: Self-pay

## 2022-11-07 ENCOUNTER — Other Ambulatory Visit (HOSPITAL_COMMUNITY): Payer: Self-pay

## 2022-11-08 ENCOUNTER — Other Ambulatory Visit: Payer: Self-pay

## 2022-11-11 ENCOUNTER — Other Ambulatory Visit (HOSPITAL_COMMUNITY): Payer: Self-pay

## 2022-11-23 DIAGNOSIS — R0602 Shortness of breath: Secondary | ICD-10-CM | POA: Diagnosis not present

## 2022-12-02 ENCOUNTER — Encounter: Payer: Self-pay | Admitting: Internal Medicine

## 2022-12-02 ENCOUNTER — Other Ambulatory Visit: Payer: Self-pay

## 2022-12-02 ENCOUNTER — Ambulatory Visit: Payer: 59 | Attending: Internal Medicine

## 2022-12-02 ENCOUNTER — Ambulatory Visit (INDEPENDENT_AMBULATORY_CARE_PROVIDER_SITE_OTHER): Payer: 59 | Admitting: Internal Medicine

## 2022-12-02 VITALS — BP 142/82 | HR 83 | Temp 98.1°F | Resp 20 | Wt 266.2 lb

## 2022-12-02 DIAGNOSIS — I2510 Atherosclerotic heart disease of native coronary artery without angina pectoris: Secondary | ICD-10-CM

## 2022-12-02 DIAGNOSIS — H1011 Acute atopic conjunctivitis, right eye: Secondary | ICD-10-CM

## 2022-12-02 DIAGNOSIS — J302 Other seasonal allergic rhinitis: Secondary | ICD-10-CM

## 2022-12-02 DIAGNOSIS — J3089 Other allergic rhinitis: Secondary | ICD-10-CM

## 2022-12-02 DIAGNOSIS — I10 Essential (primary) hypertension: Secondary | ICD-10-CM | POA: Diagnosis not present

## 2022-12-02 DIAGNOSIS — L501 Idiopathic urticaria: Secondary | ICD-10-CM

## 2022-12-02 DIAGNOSIS — R0600 Dyspnea, unspecified: Secondary | ICD-10-CM

## 2022-12-02 DIAGNOSIS — E7841 Elevated Lipoprotein(a): Secondary | ICD-10-CM

## 2022-12-02 DIAGNOSIS — Z91013 Allergy to seafood: Secondary | ICD-10-CM | POA: Diagnosis not present

## 2022-12-02 DIAGNOSIS — E785 Hyperlipidemia, unspecified: Secondary | ICD-10-CM

## 2022-12-02 LAB — HEPATIC FUNCTION PANEL
ALT: 15 IU/L (ref 0–32)
AST: 18 IU/L (ref 0–40)
Albumin: 3.9 g/dL (ref 3.9–4.9)
Alkaline Phosphatase: 152 IU/L — ABNORMAL HIGH (ref 44–121)
Bilirubin Total: 0.3 mg/dL (ref 0.0–1.2)
Bilirubin, Direct: 0.11 mg/dL (ref 0.00–0.40)
Total Protein: 7 g/dL (ref 6.0–8.5)

## 2022-12-02 LAB — LIPID PANEL
Chol/HDL Ratio: 2 ratio (ref 0.0–4.4)
Cholesterol, Total: 104 mg/dL (ref 100–199)
HDL: 52 mg/dL (ref >39)
LDL Chol Calc (NIH): 33 mg/dL (ref 0–99)
Triglycerides: 102 mg/dL (ref 0–149)
VLDL Cholesterol Cal: 19 mg/dL (ref 5–40)

## 2022-12-02 MED ORDER — OLOPATADINE HCL 0.2 % OP SOLN
1.0000 [drp] | Freq: Every day | OPHTHALMIC | 5 refills | Status: DC | PRN
  Filled 2022-12-02: qty 2.5, 25d supply, fill #0

## 2022-12-02 MED ORDER — FLUTICASONE PROPIONATE 50 MCG/ACT NA SUSP
2.0000 | Freq: Every day | NASAL | 5 refills | Status: DC
  Filled 2022-12-02: qty 16, 30d supply, fill #0
  Filled 2023-06-07: qty 16, 30d supply, fill #1
  Filled 2023-07-20: qty 16, 30d supply, fill #2
  Filled 2023-10-14: qty 16, 30d supply, fill #3
  Filled 2023-11-11: qty 16, 30d supply, fill #4

## 2022-12-02 MED ORDER — FAMOTIDINE 20 MG PO TABS
20.0000 mg | ORAL_TABLET | Freq: Two times a day (BID) | ORAL | 5 refills | Status: DC | PRN
  Filled 2022-12-02 – 2022-12-30 (×2): qty 60, 30d supply, fill #0
  Filled 2022-12-30 – 2023-05-25 (×2): qty 60, 30d supply, fill #1
  Filled 2023-07-06: qty 60, 30d supply, fill #2
  Filled 2023-08-11: qty 60, 30d supply, fill #3
  Filled 2023-09-11: qty 60, 30d supply, fill #4

## 2022-12-02 MED ORDER — FEXOFENADINE HCL 180 MG PO TABS
180.0000 mg | ORAL_TABLET | Freq: Two times a day (BID) | ORAL | 5 refills | Status: DC | PRN
  Filled 2022-12-02: qty 60, 30d supply, fill #0

## 2022-12-02 NOTE — Patient Instructions (Addendum)
Return in about 6 months (around 06/02/2023).   Allergic Rhinitis Allergic Conjunctivitis  - Positive skin test 09/2022: dust mite, mold, tree - Avoidance measures discussed. - Use nasal saline rinses before nose sprays such as with Neilmed Sinus Rinse.  Use distilled water.   - Use Flonase 2 sprays each nostril daily. Aim upward and outward. - Use Allegra '180mg'$  daily as needed.  - Use Olopatadine 0.2% 1 drop each eye daily as needed for itchy, watery eyes.    Chronic Idiopathic Urticaria/Angioedema (Hives/Swelling) Chronic Kidney Disease, Improving  - At this time etiology of hives and swelling is unknown.  Hives can be caused by a variety of different triggers including illness/infection, foods, medications, stings, exercise, pressure, vibrations, extremes of temperature to name a few however majority of the time there is no identifiable trigger. - She did have an elevated Tryptase 09/2022.  No symptoms of anaphylaxis.  We will repeat it in the future.   -Start Allegra '180mg'$  daily if hives return.   -If hives swelling reoccur, increase to Allegra '180mg'$  twice daily.   -If no improvement in 2-3 days, add Pepcid '20mg'$  twice daily and continue Allegra '180mg'$  twice daily. - Will monitor for side effects with high dose anti-histamines with CKD.    Food allergy:  - please strictly avoid fish and shellfish.  SPT positive 09/2022 to fish but negative to shellfish.  She has GI issues with both so has no interest in reintroduction.  - for SKIN only reaction, okay to take Benadryl '25mg'$  capsules every 6 hours - for SKIN + ANY additional symptoms, OR IF concern for LIFE THREATENING reaction = Epipen Autoinjector EpiPen 0.3 mg. - If using Epinephrine autoinjector, call 911 or go to the ER.

## 2022-12-02 NOTE — Progress Notes (Signed)
FOLLOW UP Date of Service/Encounter:  12/02/22   Subjective:  Krystal Allen (DOB: 10/20/1956) is a 67 y.o. female who returns to the [facility] 12/02/2022 for follow up for allergic rhinoconjunctivitis, idiopathic urticaria/angioedema and food allergy.   History obtained from: chart review and patient.  Last visit was with me 10/07/2022 and was started on Flonase/saline rinses for allergies, Allegra/Pepcid for hives and avoidance of fish/shellfish with no interested in reintroduction and has an Epipen.  Of note, she has CKD so we are only doing double dosing of anti histamines.    Since last visit, she has seen Pulm for emphysema 2/2 to smoking, Langerhans cell histiocytosis.  On Breo and Spiriva. Not using oxygen as recommended.  Also did a RHC 09/2022 and filling pressures were normal.      She is here for a follow up today. Not the best historian.  Also on her phone during the visit.  Reports having some congestion and also with phlegm.  Using Flonase PRN and not using Allegra.  Also rarely does saline rinses.  Not much runny nose, sneezing, itchy watery eyes.   In terms of the hives, she doesn't think she has had any.  She might have had an episode of facial swelling but isn't sure if this was after the visit or prior to.  Denies any episodes of anaphylaxis. She never started the Allegra or Pepcid. Reports we never told her to.   In terms of food allergy, avoids fish and shellfish. Has an Epipen. No accidental exposures.   Past Medical History: Past Medical History:  Diagnosis Date   Allergy    COPD (chronic obstructive pulmonary disease) (HCC)    Depression    GERD (gastroesophageal reflux disease)    Hernia, abdominal    History of chicken pox    Hyperlipidemia    Hypertension    Internal hemorrhoids with Grade 3 prolapse and bleeding 06/19/2013   Osteoarthritis    Personal history of colonic adenomas 06/25/2013    Objective:  BP (!) 142/82   Pulse  83   Temp 98.1 F (36.7 C) (Temporal)   Resp 20   Wt 266 lb 3.2 oz (120.7 kg)   SpO2 92%   BMI 42.97 kg/m  Body mass index is 42.97 kg/m. Physical Exam: GEN: alert, well developed HEENT: clear conjunctiva, TM grey and translucent, nose with moderate inferior turbinate hypertrophy, pink nasal mucosa, clear rhinorrhea, no cobblestoning HEART: regular rate and rhythm, no murmur LUNGS: clear to auscultation bilaterally, no coughing, unlabored respiration SKIN: no rashes or lesions   Assessment:   1. Seasonal and perennial allergic rhinitis   2. Idiopathic urticaria   3. Shellfish allergy   4. Fish allergy     Plan/Recommendations:   Allergic Rhinitis Allergic Conjunctivitis  - Improved but with some symptoms. Discussed daily use of INCS for better control and addition of oral anti histamine PRN.  - Positive skin test 09/2022: dust mite, mold, tree - Avoidance measures discussed. - Use nasal saline rinses before nose sprays such as with Neilmed Sinus Rinse.  Use distilled water.   - Use Flonase 2 sprays each nostril daily. Aim upward and outward. - Use Allegra '180mg'$  daily as needed.  - Use Olopatadine 0.2% 1 drop each eye daily as needed for itchy, watery eyes.    Chronic Idiopathic Urticaria/Angioedema (Hives/Swelling) Chronic Kidney Disease - Discussed to keep better record of hives/angioedema.  Also reprinted her instruction and went over them again on step  up therapy with anti histamines if hives return. - She did have an elevated Tryptase 09/2022.  No symptoms of anaphylaxis/GI symptoms; she does have respiratory symptoms due to her emphysema/PLCH.  We will repeat it in the future.   -Start Allegra '180mg'$  daily if hives return.   -If hives swelling reoccur, increase to Allegra '180mg'$  twice daily.   -If no improvement in 2-3 days, add Pepcid '20mg'$  twice daily and continue Allegra '180mg'$  twice daily. -Will monitor for side effects with high dose anti-histamines with CKD.     Food allergy:  - please strictly avoid fish and shellfish.  SPT positive 09/2022 to fish but negative to shellfish.  She has GI issues with both so has no interest in reintroduction.  - for SKIN only reaction, okay to take Benadryl '25mg'$  capsules every 6 hours - for SKIN + ANY additional symptoms, OR IF concern for LIFE THREATENING reaction = Epipen Autoinjector EpiPen 0.3 mg. - If using Epinephrine autoinjector, call 911 or go to the ER.    Return in about 6 months (around 06/02/2023).  Harlon Flor, MD [facility]

## 2022-12-24 DIAGNOSIS — R0602 Shortness of breath: Secondary | ICD-10-CM | POA: Diagnosis not present

## 2022-12-30 ENCOUNTER — Other Ambulatory Visit: Payer: Self-pay | Admitting: Internal Medicine

## 2022-12-30 ENCOUNTER — Other Ambulatory Visit (HOSPITAL_COMMUNITY): Payer: Self-pay

## 2022-12-31 ENCOUNTER — Other Ambulatory Visit (HOSPITAL_COMMUNITY): Payer: Self-pay

## 2022-12-31 MED ORDER — PHENTERMINE HCL 37.5 MG PO CAPS
37.5000 mg | ORAL_CAPSULE | ORAL | 1 refills | Status: DC
  Filled 2022-12-31: qty 30, 30d supply, fill #0
  Filled 2023-02-01: qty 30, 30d supply, fill #1

## 2023-01-02 ENCOUNTER — Other Ambulatory Visit: Payer: Self-pay

## 2023-01-02 ENCOUNTER — Other Ambulatory Visit (HOSPITAL_COMMUNITY): Payer: Self-pay

## 2023-01-04 ENCOUNTER — Telehealth: Payer: Self-pay | Admitting: Internal Medicine

## 2023-01-04 DIAGNOSIS — M7989 Other specified soft tissue disorders: Secondary | ICD-10-CM

## 2023-01-04 NOTE — Telephone Encounter (Signed)
Pt c/o swelling: STAT is pt has developed SOB within 24 hours  How much weight have you gained and in what time span? no  If swelling, where is the swelling located? Swelling in foot   Are you currently taking a fluid pill? Yes   Are you currently SOB? no  Do you have a log of your daily weights (if so, list)? no  Have you gained 3 pounds in a day or 5 pounds in a week? no  Have you traveled recently? no

## 2023-01-04 NOTE — Telephone Encounter (Signed)
Patient reports BLE swelling L>R and she is very SOB walking from bedroom to bathroom.  She reports she has COPD but SOB started around the time her feet started swelling 3 days ago.  Edema includes ankles.  Painful to walk.    Continues lasix and hctz daily as directed.   Has appointment with kidney doctor this Friday.    She was started on Lasix by Dr. Ali Lowe in May.  Pt reports it improved swelling some, and it has worsened at times but never to the point that it hurts, like this week.

## 2023-01-05 NOTE — Telephone Encounter (Signed)
Order placed for BLE duplex for DVT.  Pt states she knows it is not a blood clot because she has had one of those about 10 years ago.  She will increase lasix to BID until her kidney appointment tomorrow.

## 2023-01-06 ENCOUNTER — Telehealth: Payer: Self-pay

## 2023-01-06 ENCOUNTER — Ambulatory Visit (HOSPITAL_COMMUNITY)
Admission: RE | Admit: 2023-01-06 | Discharge: 2023-01-06 | Disposition: A | Discharge status: Home / Self Care | Payer: 59 | Source: Ambulatory Visit | Attending: Internal Medicine | Admitting: Internal Medicine

## 2023-01-06 DIAGNOSIS — N1832 Chronic kidney disease, stage 3b: Secondary | ICD-10-CM | POA: Diagnosis not present

## 2023-01-06 DIAGNOSIS — R600 Localized edema: Secondary | ICD-10-CM | POA: Diagnosis not present

## 2023-01-06 DIAGNOSIS — M7989 Other specified soft tissue disorders: Secondary | ICD-10-CM

## 2023-01-06 DIAGNOSIS — I129 Hypertensive chronic kidney disease with stage 1 through stage 4 chronic kidney disease, or unspecified chronic kidney disease: Secondary | ICD-10-CM | POA: Diagnosis not present

## 2023-01-06 DIAGNOSIS — E785 Hyperlipidemia, unspecified: Secondary | ICD-10-CM | POA: Diagnosis not present

## 2023-01-06 DIAGNOSIS — J449 Chronic obstructive pulmonary disease, unspecified: Secondary | ICD-10-CM | POA: Diagnosis not present

## 2023-01-06 LAB — LAB REPORT - SCANNED: EGFR: 40

## 2023-01-06 NOTE — Telephone Encounter (Signed)
Called patient to review results of bilateral dopplers of legs, patient verbalizes understanding this test was negative for presence of DVT. Patient states she saw her kidney doctor today and completed lab work with him, she states he asked her to make a follow up appt with Dr. Ali Lowe.   On January 04, 2023, patient called in c/o leg swelling. Dr. Ali Lowe ordered venous dopplers at that time and advised patient to increase her lasix to 20 mg BID until she saw her nephrologist today (01/06/23). Patient states nephrologist ordered labs, which were completed, and advised patient to continue on 20 mg lasix BID until Labs resulted, which would likely be Monday. Patient states nephrologist also asked her to make a follow up appointment with Dr. Ali Lowe. I scheduled her for 01/17/23 at 3 pm.

## 2023-01-06 NOTE — Telephone Encounter (Signed)
Zadok Holaway from [facility] calling to report preliminary report of negative DVT for this patient. Forwarded to Dr. Ali Lowe.

## 2023-01-13 NOTE — Progress Notes (Signed)
Cardiology Office Note:    Date:  01/25/2023   ID:  Krystal Allen, DOB 1956/09/20, MRN [id number]  PCP:  Ladell Pier, MD   [facility] Providers Cardiologist:  Lenna Sciara, MD Referring MD: Ladell Pier, MD   Chief Complaint/Reason for Referral: Dyspnea on exertion and lower extremity edema  PATIENT DID NOT APPEAR FOR APPOINTMENT   ASSESSMENT:    1. Bilateral swelling of feet   2. Pulmonary emphysema, unspecified emphysema type ([place])   3. Aortic atherosclerosis ([place])   4. Coronary artery disease involving native coronary artery of native heart without angina pectoris   5. Primary hypertension   6. Hyperlipidemia LDL goal <70   7. Stage 3 chronic kidney disease, unspecified whether stage 3a or 3b CKD ([place])   8. BMI 40.0-44.9, adult ([place])       PLAN:    In order of problems listed above: 1.  Lower extremity edema: 2.  Pulmonary emphysema:   Followed by pulmonology. 3.  Aortic atherosclerosis: Continue aspirin, and statin with tight blood pressure control. 4.  Coronary artery disease:  Continue aspirin, statin, and aggressive blood pressure control. 5.  Hypertension: Blood pressures well controlled on her current regimen 6.  Hyperlipidemia: Currently managed by Dr. Debara Pickett who was considering a PCSK9 inhibitor.   7.  Chronic kidney disease: Followed by nephrology. 8.  Elevated BMI: Refer to pharmacy for recommendations regarding pharmacotherapy  Dispo:  No follow-ups on file.      Medication Adjustments/Labs and Tests Ordered: Current medicines are reviewed at length with the patient today.  Concerns regarding medicines are outlined above.  The following changes have been made:     Labs/tests ordered: No orders of the defined types were placed in this encounter.   Medication Changes: No orders of the defined types were placed in this encounter.       History of Present Illness:    FOCUSED PROBLEM LIST:   1.  Hypertension 2.   Hyperlipidemia 3.  BMI 42 4.  Aortic atherosclerosis and coronary calcification seen on chest CT 2023 5.  Prediabetes 6.  Hypertension 7.  Hyperlipidemia; with elevated LP(a) 8.  CKD stage III with creatinine around 1.3 9.  COPD on supplemental oxygen 10.  Interstitial lung disease 12.  Moderate disease of mid LAD on coronary CTA with negative FFR June 2023  May 2023 consultation:  The patient is a 67 y.o. female with the indicated medical history here for recommendations regarding shortness of breath.  She was seen in the emergency department recently for chest pain which was tender to palpation.  She tells me that she has had increasing shortness of breath.  Her albuterol inhaler does not seem to help this.  She does note some wheezing at times.  She is not short of breath at rest.  She has noticed increasing peripheral edema as well as some orthopnea.  She denies any paroxysmal nocturnal dyspnea.  She denies any overt chest pain.  She has had no presyncope, syncope, or palpitations.  She has had no signs or symptoms of stroke.  She does have a long history of smoking and is on chronic supplemental oxygen.  Her last PFTs in our system 10/2017 and her FEV1 was 2.51 with a r moderately severe fusion defect.  Plan: Obtain coronary CT due to dyspnea; start empiric Lasix; increase atorvastatin to 40 mg.  December 2023: In the interim the patient had a coronary CTA which showed a moderate.  Her atorvastatin was  increased to 80 mg for an LDL above goal.  She tells me that starting Lasix has helped her peripheral edema.  She has not noticed that her breathing is much improved.  She denies however any orthopnea or paroxysmal nocturnal dyspnea.  She does get short of breath when he exerts herself more than moderately.  She denies any exertional angina.  She has not required any emergency room visits or hospitalizations.  She is scheduled to see Dr. Debara Pickett for elevated LP(a) level soon.  Plan: Given ongoing  dyspnea refer for right heart catheterization.  Today: In the interim the patient had right heart catheterization which showed low filling pressures.  She called our office recently with complaints of increasing lower extremity edema.  Lower extremity Dopplers were pursued which were negative for DVT.  Her Lasix was increased to 20 mg twice daily.         Current Medications: No outpatient medications have been marked as taking for the 01/17/23 encounter (Office Visit) with [name], MD.     Allergies:    Sulfa antibiotics, Chantix [varenicline tartrate], Fish allergy, Hygroton [chlorthalidone], Penicillins, and Latex   Social History:   Social History   Tobacco Use   Smoking status: Former    Packs/day: 0.50    Years: 40.00    Additional pack years: 0.00    Total pack years: 20.00    Types: Cigarettes    Quit date: 07/2021    Years since quitting: 1.4    Passive exposure: Never   Smokeless tobacco: Never   Tobacco comments:    Quit [id number]  Vaping Use   Vaping Use: Former  Substance Use Topics   Alcohol use: Yes    Comment: occasional   Drug use: No     Family Hx: Family History  Problem Relation Age of Onset   Cancer Father        prostate   Hypertension Mother    Diabetes Mother    Hypertension Sister    Heart disease Maternal Uncle    Stroke Maternal Grandmother    Hypertension Maternal Grandmother    Hypertension Sister    Colon cancer Neg Hx    Stomach cancer Neg Hx    Rectal cancer Neg Hx    Colon polyps Neg Hx      Review of Systems:   Please see the history of present illness.    All other systems reviewed and are negative.     EKGs/Labs/Other Test Reviewed:    EKG:  EKG performed April 2023 that I personally reviewed demonstrates sinus rhythm with nonspecific ST-T wave changes  Prior CV studies:  [place] 2023: 1.  Relatively normal filling pressures with a mean RA pressure of 5 mmHg, RV pressure of 33/-2 with RV end-diastolic pressure  of 4 mmHg, wedge pressure of 7 mmHg, and PA pressure of 37/15 with a mean of 23 mmHg; the Fick cardiac output using a noninvasive oxygen saturation was 5.2 L/min and cardiac index was 2.3 L/min/m.   Recommendation: Dyspnea seems to be not related to elevated filling pressures.  TTE 2023: 1. Left ventricular ejection fraction, by estimation, is 60 to 65%. Left  ventricular ejection fraction by 3D volume is 66 %. The left ventricle has  normal function. The left ventricle has no regional wall motion  abnormalities. Left ventricular diastolic   parameters are consistent with Grade I diastolic dysfunction (impaired  relaxation).   2. Right ventricular systolic function is normal. The right ventricular  size is normal.   3. The mitral valve is normal in structure. Mild mitral valve  regurgitation. No evidence of mitral stenosis.   4. The aortic valve is calcified. There is mild calcification of the  aortic valve. There is mild thickening of the aortic valve. Aortic valve  regurgitation is not visualized. Aortic valve sclerosis/calcification is  present, without any evidence of  aortic stenosis.   5. The inferior vena cava is normal in size with greater than 50%  respiratory variability, suggesting right atrial pressure of 3 mmHg.   Coronary CT 2023: 1. Coronary calcium score of 24.9. This was 72nd percentile for age-, sex, and race-matched controls. 2. Normal coronary origin with right dominance. 3. Moderate mixed density plaque (50-60%) in the mid LAD. 4. Minimal CAD (<25%) in the LCX/RCA. 5. Dilated pulmonary artery suggestive of pulmonary hypertension.  Nuclear stress test 2012 low risk  Other studies Reviewed: Review of the additional studies/records demonstrates: Chest CT 2023 with aortic atherosclerosis and coronary artery calcification  Recent Labs: 03/08/2022: TSH 3.490 10/03/2022: Platelets 260 10/12/2022: BUN 16; Creatinine, Ser 1.43; Hemoglobin 13.6; Potassium 3.0; Sodium  143 12/02/2022: ALT 15   Recent Lipid Panel Lab Results  Component Value Date/Time   CHOL 104 12/02/2022 09:09 AM   TRIG 102 12/02/2022 09:09 AM   HDL 52 12/02/2022 09:09 AM   LDLCALC 33 12/02/2022 09:09 AM   LDLDIRECT 190.1 04/16/2013 03:31 PM    Risk Assessment/Calculations:           Physical Exam:      Signed, [name], MD  01/25/2023 12:44 PM    [facility] [address], [place], [place]  [address] Phone: [phone]; Fax: [phone]   Note:  This document was prepared using Dragon voice recognition software and may include unintentional dictation errors.

## 2023-01-17 ENCOUNTER — Ambulatory Visit: Payer: Self-pay | Admitting: *Deleted

## 2023-01-17 ENCOUNTER — Other Ambulatory Visit (HOSPITAL_COMMUNITY): Payer: Self-pay

## 2023-01-17 ENCOUNTER — Ambulatory Visit (INDEPENDENT_AMBULATORY_CARE_PROVIDER_SITE_OTHER): Payer: 59 | Admitting: Internal Medicine

## 2023-01-17 DIAGNOSIS — E785 Hyperlipidemia, unspecified: Secondary | ICD-10-CM

## 2023-01-17 DIAGNOSIS — M7989 Other specified soft tissue disorders: Secondary | ICD-10-CM

## 2023-01-17 DIAGNOSIS — Z6841 Body Mass Index (BMI) 40.0 and over, adult: Secondary | ICD-10-CM

## 2023-01-17 DIAGNOSIS — J439 Emphysema, unspecified: Secondary | ICD-10-CM

## 2023-01-17 DIAGNOSIS — I10 Essential (primary) hypertension: Secondary | ICD-10-CM

## 2023-01-17 DIAGNOSIS — N183 Chronic kidney disease, stage 3 unspecified: Secondary | ICD-10-CM

## 2023-01-17 DIAGNOSIS — I2510 Atherosclerotic heart disease of native coronary artery without angina pectoris: Secondary | ICD-10-CM

## 2023-01-17 DIAGNOSIS — I700 Atherosclerosis of aorta: Secondary | ICD-10-CM

## 2023-01-17 NOTE — Telephone Encounter (Signed)
Call placed to patient unable . Unable to leave message no VM

## 2023-01-17 NOTE — Telephone Encounter (Signed)
Pt states that she had a cysts/boil on her nipple on her right breast that busted and drained and she is wanting to see if her PCP can all her in an antibiotic. Per pt it is sore. Please call pt back.   Reason for Disposition  Boil > 1/2 inch across (> 12 mm; larger than a marble)    Patient states boil has opened and drained- requesting antibiotic  Answer Assessment - Initial Assessment Questions 1. APPEARANCE of BOIL: "What does the boil look like?"      Open and not draining- tender 2. LOCATION: "Where is the boil located?"      R breast- close to nipple 3. NUMBER: "How many boils are there?"      one 4. SIZE: "How big is the boil?" (e.g., inches, cm; compare to size of a coin or other object)     Dime size 5. ONSET: "When did the boil start?"     2 days 6. PAIN: "Is there any pain?" If Yes, ask: "How bad is the pain?"   (Scale 1-10; or mild, moderate, severe)     Tender- mild 7. FEVER: "Do you have a fever?" If Yes, ask: "What is it, how was it measured, and when did it start?"      no 8. SOURCE: "Have you been around anyone with boils or other Staph infections?" "Have you ever had boils before?"     Hx boil- breast 9. OTHER SYMPTOMS: "Do you have any other symptoms?" (e.g., shaking chills, weakness, rash elsewhere on body)     no  Protocols used: Boil (Skin Abscess)-A-AH

## 2023-01-17 NOTE — Telephone Encounter (Signed)
  Chief Complaint: boil- R breast Symptoms: open area- size of dime- has drained, tenderness at site Frequency: appeared 2 days ago- hx boils Pertinent Negatives: Patient denies fever, drainage now Disposition: [] ED /[] Urgent Care (no appt availability in office) / [] Appointment(In office/virtual)/ []  [facility]/ [] Home Care/ [] Refused Recommended Disposition /[]  Mobile Bus/ [x]  Follow-up with PCP Additional Notes: Patient is requesting antibiotic for boil- it has opened and drained- patient advised she would need to be seen for antibiotic Rx- message sent to office after attempt to message- they will follow up.

## 2023-01-18 NOTE — Progress Notes (Signed)
Cardiology Office Note:    Date:  01/25/2023   ID:  Krystal Allen, DOB August 16, 1956, MRN [id number]  PCP:  Ladell Pier, MD  [facility] Prov[facility] Cardiologist:  [name], MD     Referring MD: Ladell Pier, MD   Chief Complaint:  Leg Swelling and Follow-up     History of Present Illness:   Krystal Allen is a 67 y.o. female with history of CAD-calcium score 24.9 with mod mixed density plaque 50-60% in mLAD, aortic atherosclerosis, HTN, HLD, CKD, obesity, pulm emphysema.  Right heart cath 2023  dyspnea not related to elevated filling pressures, echo normal LVEF 60-65% grade 1 DD.     Patient saw Dr. Ali Lowe 01/17/23 with worsening LE edema.  Patient comes in for f/u. She has lost 4 lbs and swelling better but still puffy. She hasn't taken her meds yest today. Doesn't exercise and says she doesn't plan on starting. Has allergies.       Past Medical History:  Diagnosis Date   Allergy    COPD (chronic obstructive pulmonary disease) (HCC)    Depression    GERD (gastroesophageal reflux disease)    Hernia, abdominal    History of chicken pox    Hyperlipidemia    Hypertension    Internal hemorrhoids with Grade 3 prolapse and bleeding 06/19/2013   Osteoarthritis    Personal history of colonic adenomas 06/25/2013   Current Medications: Current Meds  Medication Sig   acetaminophen (TYLENOL 8 HOUR) 650 MG CR tablet Take 1 tablet (650 mg total) by mouth 2 (two) times daily as needed for pain.   albuterol (PROAIR HFA) 108 (90 Base) MCG/ACT inhaler Inhale 2 puffs into the lungs every 4 (four) hours as needed for wheezing or shortness of breath.   albuterol (PROVENTIL) (2.5 MG/3ML) 0.083% nebulizer solution TAKE 3 MLS BY NEBULIZATION EVERY 6 (SIX) HOURS AS NEEDED FOR WHEEZING OR SHORTNESS OF BREATH.   aspirin EC 81 MG tablet Take 1 tablet (81 mg total) by mouth daily.   atorvastatin (LIPITOR) 80 MG tablet Take 1 tablet (80 mg total) by mouth daily.    cetirizine (ZYRTEC) 10 MG tablet Take 1 tablet (10 mg total) by mouth daily.   diphenhydrAMINE (BENADRYL) 25 mg capsule Take 25-50 mg by mouth every 6 (six) hours as needed for itching or allergies.   Elastic Bandages & Supports (MEDICAL COMPRESSION STOCKINGS) MISC R60.0   EPINEPHrine 0.3 mg/0.3 mL IJ SOAJ injection Inject 0.3 mg into the muscle as needed for anaphylaxis.   Evolocumab (REPATHA SURECLICK) [id number] MG/ML SOAJ Inject 140 mg into the skin every 14 (fourteen) days.   ezetimibe (ZETIA) 10 MG tablet Take 1 tablet (10 mg total) by mouth daily.   famotidine (PEPCID) 20 MG tablet Take 1 tablet (20 mg total) by mouth 2 (two) times daily as needed (hives).   fexofenadine (ALLEGRA ALLERGY) 180 MG tablet Take 1 tablet (180 mg total) by mouth 2 (two) times daily as needed (hives).   fluticasone (FLONASE) 50 MCG/ACT nasal spray Place 2 sprays into both nostrils daily.   fluticasone furoate-vilanterol (BREO ELLIPTA) 200-25 MCG/ACT AEPB Inhale 1 puff into the lungs once daily.   furosemide (LASIX) 20 MG tablet Take 1 tablet (20 mg total) by mouth daily.   hydrALAZINE (APRESOLINE) 10 MG tablet Take 1 tablet (10 mg total) by mouth 2 (two) times daily.   hydrochlorothiazide (HYDRODIURIL) 25 MG tablet Take 1 tablet (25 mg total) by mouth daily.   hydrOXYzine (ATARAX)  25 MG tablet Take 1 tablet (25 mg total) by mouth every 8 (eight) hours as needed for up to 15 doses for itching.   Olopatadine HCl 0.2 % SOLN Place 1 drop into both eyes daily as needed (allergies).   omeprazole (PRILOSEC) 20 MG capsule Take 1 capsule (20 mg total) by mouth daily.   phentermine 37.5 MG capsule Take 1 capsule (37.5 mg total) by mouth every morning.   Respiratory Therapy Supplies (FLUTTER) DEVI Use as directed   sodium chloride (OCEAN) 0.65 % nasal spray Place 2 sprays into both nostrils every 4 (four) hours. (Patient taking differently: Place 2 sprays into the nose every 4 (four) hours as needed for congestion.)   Tiotropium  Bromide Monohydrate (SPIRIVA RESPIMAT) 2.5 MCG/ACT AERS Inhale 2 puffs into the lungs daily.    Allergies:   Sulfa antibiotics, Chantix [varenicline tartrate], Fish allergy, Hygroton [chlorthalidone], Penicillins, and Latex   Social History   Tobacco Use   Smoking status: Former    Packs/day: 0.50    Years: 40.00    Additional pack years: 0.00    Total pack years: 20.00    Types: Cigarettes    Quit date: 07/2021    Years since quitting: 1.4    Passive exposure: Never   Smokeless tobacco: Never   Tobacco comments:    Quit [id number]  Vaping Use   Vaping Use: Former  Substance Use Topics   Alcohol use: Yes    Comment: occasional   Drug use: No    Family Hx: The patient's family history includes Cancer in her father; Diabetes in her mother; Heart disease in her maternal uncle; Hypertension in her maternal grandmother, mother, sister, and sister; Stroke in her maternal grandmother. There is no history of Colon cancer, Stomach cancer, Rectal cancer, or Colon polyps.  ROS     Physical Exam:    VS:  BP 136/70   Pulse 89   Ht 5\' 6"  (1.676 m)   Wt 262 lb 9.6 oz (119.1 kg)   SpO2 95%   BMI 42.38 kg/m     Wt Readings from Last 3 Encounters:  01/25/23 262 lb 9.6 oz (119.1 kg)  12/02/22 266 lb 3.2 oz (120.7 kg)  11/03/22 266 lb (120.7 kg)    Physical Exam  GEN: Obese, in no acute distress  Neck: no JVD, carotid bruits, or masses Cardiac:RRR; no murmurs, rubs, or gallops  Respiratory:  decreased breath sounds but clear to auscultation bilaterally, normal work of breathing GI: soft, nontender, nondistended, + BS Ext: without cyanosis, clubbing, or edema, Good distal pulses bilaterally Neuro:  Alert and Oriented x 3,  Psych: euthymic mood, full affect        EKGs/Labs/Other Test Reviewed:    EKG:  EKG is not ordered today.     Recent Labs: 03/08/2022: TSH 3.490 10/03/2022: Platelets 260 10/12/2022: BUN 16; Creatinine, Ser 1.43; Hemoglobin 13.6; Potassium 3.0; Sodium  143 12/02/2022: ALT 15   Recent Lipid Panel Recent Labs    12/02/22 0909  CHOL 104  TRIG 102  HDL 52  LDLCALC 33     Prior CV Studies:   [place] 2023: 1.  Relatively normal filling pressures with a mean RA pressure of 5 mmHg, RV pressure of 33/-2 with RV end-diastolic pressure of 4 mmHg, wedge pressure of 7 mmHg, and PA pressure of 37/15 with a mean of 23 mmHg; the Fick cardiac output using a noninvasive oxygen saturation was 5.2 L/min and cardiac index was 2.3 L/min/m.   Recommendation:  Dyspnea seems to be not related to elevated filling pressures.   TTE 2023: 1. Left ventricular ejection fraction, by estimation, is 60 to 65%. Left  ventricular ejection fraction by 3D volume is 66 %. The left ventricle has  normal function. The left ventricle has no regional wall motion  abnormalities. Left ventricular diastolic   parameters are consistent with Grade I diastolic dysfunction (impaired  relaxation).   2. Right ventricular systolic function is normal. The right ventricular  size is normal.   3. The mitral valve is normal in structure. Mild mitral valve  regurgitation. No evidence of mitral stenosis.   4. The aortic valve is calcified. There is mild calcification of the  aortic valve. There is mild thickening of the aortic valve. Aortic valve  regurgitation is not visualized. Aortic valve sclerosis/calcification is  present, without any evidence of  aortic stenosis.   5. The inferior vena cava is normal in size with greater than 50%  respiratory variability, suggesting right atrial pressure of 3 mmHg.    Coronary CT 2023: 1. Coronary calcium score of 24.9. This was 72nd percentile for age-, sex, and race-matched controls. 2. Normal coronary origin with right dominance. 3. Moderate mixed density plaque (50-60%) in the mid LAD. 4. Minimal CAD (<25%) in the LCX/RCA. 5. Dilated pulmonary artery suggestive of pulmonary hypertension.   Nuclear stress test 2012 low risk     Risk  Assessment/Calculations/Metrics:              ASSESSMENT & PLAN:   No problem-specific Assessment & Plan notes found for this encounter.   DOE and LE edema lasix increased 20 mg bid. Swelling and breathing improved. Will check bmet to make sure renal ok. Recommend compression hose and 150 min exercise and weight loss.  CAD with mod mixed density plaque 50-60% mLAD on coronary CTA negative FFR-no chest pain  HTN BP up but hasn't taken meds yet. Repeat BP better.  HLD-followed by Dr. Debara Pickett on repatha, zetia  CKD-followed by renal-check today on higher dose lasix  Obesity-exercise and weight loss.            Dispo:  No follow-ups on file.   Medication Adjustments/Labs and Tests Ordered: Current medicines are reviewed at length with the patient today.  Concerns regarding medicines are outlined above.  Tests Ordered: Orders Placed This Encounter  Procedures   Basic metabolic panel   Medication Changes: No orders of the defined types were placed in this encounter.  Sumner Boast, PA-C  01/25/2023 10:44 AM    [facility] [address], [place], [place]  [address] Phone: [phone]; Fax: [phone]

## 2023-01-22 ENCOUNTER — Other Ambulatory Visit: Payer: Self-pay

## 2023-01-22 DIAGNOSIS — R0602 Shortness of breath: Secondary | ICD-10-CM | POA: Diagnosis not present

## 2023-01-25 ENCOUNTER — Encounter: Payer: Self-pay | Admitting: Physician Assistant

## 2023-01-25 ENCOUNTER — Ambulatory Visit: Payer: 59 | Attending: Internal Medicine | Admitting: Physician Assistant

## 2023-01-25 VITALS — BP 136/70 | HR 89 | Ht 66.0 in | Wt 262.6 lb

## 2023-01-25 DIAGNOSIS — Z6841 Body Mass Index (BMI) 40.0 and over, adult: Secondary | ICD-10-CM

## 2023-01-25 DIAGNOSIS — I10 Essential (primary) hypertension: Secondary | ICD-10-CM | POA: Diagnosis not present

## 2023-01-25 DIAGNOSIS — R600 Localized edema: Secondary | ICD-10-CM | POA: Diagnosis not present

## 2023-01-25 DIAGNOSIS — N183 Chronic kidney disease, stage 3 unspecified: Secondary | ICD-10-CM

## 2023-01-25 DIAGNOSIS — E785 Hyperlipidemia, unspecified: Secondary | ICD-10-CM

## 2023-01-25 DIAGNOSIS — R0600 Dyspnea, unspecified: Secondary | ICD-10-CM | POA: Diagnosis not present

## 2023-01-25 DIAGNOSIS — I2510 Atherosclerotic heart disease of native coronary artery without angina pectoris: Secondary | ICD-10-CM

## 2023-01-25 NOTE — Patient Instructions (Signed)
Medication Instructions:  Your physician recommends that you continue on your current medications as directed. Please refer to the Current Medication list given to you today.  *If you need a refill on your cardiac medications before your next appointment, please call your pharmacy*   Lab Work: Bmp- today   If you have labs (blood work) drawn today and your tests are completely normal, you will receive your results only by: [facility] [place] (if you have MyChart) OR A paper copy in the mail If you have any lab test that is abnormal or we need to change your treatment, we will call you to review the results.   Testing/Procedures: None ordered    Follow-Up: At [facility], you and your health needs are our priority.  As part of our continuing mission to provide you with exceptional heart care, we have created designated Provider Care Teams.  These Care Teams include your primary Cardiologist (physician) and Advanced Practice Providers (APPs -  Physician Assistants and Nurse Practitioners) who all work together to provide you with the care you need, when you need it.  We recommend signing up for the patient portal called "MyChart".  Sign up information is provided on this After Visit Summary.  MyChart is used to connect with patients for Virtual Visits (Telemedicine).  Patients are able to view lab/test results, encounter notes, upcoming appointments, etc.  Non-urgent messages can be sent to your provider as well.   To learn more about what you can do with MyChart, go to [email or web address].    Your next appointment:   6 month(s)  Provider:   [name], MD     Other Instructions

## 2023-01-27 ENCOUNTER — Other Ambulatory Visit: Payer: Self-pay

## 2023-01-27 ENCOUNTER — Telehealth: Payer: Self-pay

## 2023-01-27 DIAGNOSIS — E876 Hypokalemia: Secondary | ICD-10-CM

## 2023-01-27 DIAGNOSIS — Z79899 Other long term (current) drug therapy: Secondary | ICD-10-CM

## 2023-01-27 LAB — BASIC METABOLIC PANEL
BUN/Creatinine Ratio: 12 (ref 12–28)
BUN: 17 mg/dL (ref 8–27)
CO2: 24 mmol/L (ref 20–29)
Calcium: 9 mg/dL (ref 8.7–10.3)
Chloride: 99 mmol/L (ref 96–106)
Creatinine, Ser: 1.39 mg/dL — ABNORMAL HIGH (ref 0.57–1.00)
Glucose: 127 mg/dL — ABNORMAL HIGH (ref 70–99)
Potassium: 3.1 mmol/L — ABNORMAL LOW (ref 3.5–5.2)
Sodium: 143 mmol/L (ref 134–144)
eGFR: 42 mL/min/{1.73_m2} — ABNORMAL LOW (ref >59)

## 2023-01-27 MED ORDER — POTASSIUM CHLORIDE CRYS ER 20 MEQ PO TBCR
EXTENDED_RELEASE_TABLET | ORAL | 3 refills | Status: DC
  Filled 2023-01-27: qty 60, 30d supply, fill #0

## 2023-01-27 NOTE — Telephone Encounter (Signed)
Spoke with pt and advised of results per provider.  Pt advised she will need to begin KCL 12meq - 4 tablets by mouth today then 1 tablet by mouth twice daily.  Repeat lab in 2 weeks.  Appointment scheduled.  Pt verbalizes understanding and agrees with current plan.

## 2023-01-29 ENCOUNTER — Ambulatory Visit
Admission: EM | Admit: 2023-01-29 | Discharge: 2023-01-29 | Disposition: A | Discharge status: Home / Self Care | Payer: 59 | Attending: Physician Assistant | Admitting: Physician Assistant

## 2023-01-29 ENCOUNTER — Other Ambulatory Visit: Payer: Self-pay

## 2023-01-29 ENCOUNTER — Encounter: Payer: Self-pay | Admitting: Emergency Medicine

## 2023-01-29 DIAGNOSIS — J209 Acute bronchitis, unspecified: Secondary | ICD-10-CM

## 2023-01-29 MED ORDER — DOXYCYCLINE HYCLATE 100 MG PO CAPS: 100.0000 mg | ORAL_CAPSULE | Freq: Two times a day (BID) | ORAL | 0 refills | Status: DC

## 2023-01-29 MED ORDER — BENZONATATE 100 MG PO CAPS: 100.0000 mg | ORAL_CAPSULE | Freq: Four times a day (QID) | ORAL | 0 refills | Status: DC | PRN

## 2023-01-29 NOTE — Discharge Instructions (Addendum)
Return if any problems.

## 2023-01-29 NOTE — ED Triage Notes (Signed)
Pt here for cough and congestion with some diarrhea x 4 days

## 2023-01-30 NOTE — ED Provider Notes (Signed)
[facility]    CSN: [id number] Arrival date & time: 01/29/23  1110      History   Chief Complaint Chief Complaint  Patient presents with   Cough    HPI Krystal Allen is a 67 y.o. female.   Patient complains of a cough and congestion for the past 4 days.  Patient reports she has been experiencing some diarrhea as well  The history is provided by the patient. No language interpreter was used.  Cough Cough characteristics:  Non-productive Sputum characteristics:  Nondescript Severity:  Moderate Onset quality:  Gradual Timing:  Constant Progression:  Worsening Ineffective treatments:  None tried Associated symptoms: no chest pain and no fever     Past Medical History:  Diagnosis Date   Allergy    COPD (chronic obstructive pulmonary disease) (HCC)    Depression    GERD (gastroesophageal reflux disease)    Hernia, abdominal    History of chicken pox    Hyperlipidemia    Hypertension    Internal hemorrhoids with Grade 3 prolapse and bleeding 06/19/2013   Osteoarthritis    Personal history of colonic adenomas 06/25/2013    Patient Active Problem List   Diagnosis Date Noted   Morbid obesity 03/09/2022   Seasonal allergies 02/07/2022   Mastitis, right, acute 12/23/2021   Influenza vaccine needed 09/14/2020   Tobacco dependence 05/12/2020   Aortic atherosclerosis 05/12/2020   Adenomatous polyp of colon 05/12/2020   Langerhan's cell histiocytosis 01/26/2018   Cigarette smoker 01/26/2018   Osteoarthritis of cervical spine 12/22/2017   Prediabetes 12/22/2017   Pain of left calf 04/06/2017   Centrilobular emphysema 02/23/2016   Obesity (BMI 30-39.9) 01/15/2016   Bilateral shoulder pain 01/15/2016   Esophageal reflux 01/15/2016   Tobacco abuse 05/13/2015   Lung nodule 08/14/2014   Impaired fasting glucose 08/14/2014   Hyperlipidemia 08/14/2014   Personal history of colonic adenomas 06/25/2013   HTN (hypertension) 05/20/2013    Past Surgical  History:  Procedure Laterality Date   ABDOMINAL HYSTERECTOMY  8yrs ago   due to heavy bleeding and fibroids    COLONOSCOPY     HEMORRHOID BANDING  2014   HEMORRHOID SURGERY  59yrs agao   RIGHT HEART CATH N/A 10/12/2022   Procedure: RIGHT HEART CATH;  Surgeon: [name], MD;  Location: [facility];  Service: Cardiovascular;  Laterality: N/A;   UMBILICAL HERNIA REPAIR  06/2015    OB History   No obstetric history on file.      Home Medications    Prior to Admission medications   Medication Sig Start Date End Date Taking? Authorizing Provider  benzonatate (TESSALON PERLES) 100 MG capsule Take 1 capsule (100 mg total) by mouth every 6 (six) hours as needed for cough. 01/29/23 01/29/24 Yes Fransico Meadow, PA-C  doxycycline (VIBRAMYCIN) 100 MG capsule Take 1 capsule (100 mg total) by mouth 2 (two) times daily. 01/29/23  Yes Fransico Meadow, PA-C  acetaminophen (TYLENOL 8 HOUR) 650 MG CR tablet Take 1 tablet (650 mg total) by mouth 2 (two) times daily as needed for pain. 04/06/17   Boykin Nearing, MD  albuterol (PROAIR HFA) 108 (90 Base) MCG/ACT inhaler Inhale 2 puffs into the lungs every 4 (four) hours as needed for wheezing or shortness of breath. 01/19/22   Ladell Pier, MD  albuterol (PROVENTIL) (2.5 MG/3ML) 0.083% nebulizer solution TAKE 3 MLS BY NEBULIZATION EVERY 6 (SIX) HOURS AS NEEDED FOR WHEEZING OR SHORTNESS OF BREATH. 10/03/22  Ladell Pier, MD  aspirin EC 81 MG tablet Take 1 tablet (81 mg total) by mouth daily. 05/12/20   Ladell Pier, MD  atorvastatin (LIPITOR) 80 MG tablet Take 1 tablet (80 mg total) by mouth daily. 05/11/22   [name], MD  cetirizine (ZYRTEC) 10 MG tablet Take 1 tablet (10 mg total) by mouth daily. 02/07/22   Mayers, Cari S, PA-C  diphenhydrAMINE (BENADRYL) 25 mg capsule Take 25-50 mg by mouth every 6 (six) hours as needed for itching or allergies.    [provider]  Elastic Bandages & Supports ([place]) MISC R60.0 01/25/22   Gildardo Pounds, NP  EPINEPHrine 0.3 mg/0.3 mL IJ SOAJ injection Inject 0.3 mg into the muscle as needed for anaphylaxis. 10/26/22   Ladell Pier, MD  Evolocumab (REPATHA SURECLICK) [id number] MG/ML SOAJ Inject 140 mg into the skin every 14 (fourteen) days. 10/06/22   Hilty, Nadean Corwin, MD  ezetimibe (ZETIA) 10 MG tablet Take 1 tablet (10 mg total) by mouth daily. 10/03/22   [name], MD  famotidine (PEPCID) 20 MG tablet Take 1 tablet (20 mg total) by mouth 2 (two) times daily as needed (hives). 12/02/22   Larose Kells, MD  fexofenadine [facility] ALLERGY) 180 MG tablet Take 1 tablet (180 mg total) by mouth 2 (two) times daily as needed (hives). 12/02/22   Larose Kells, MD  fluticasone (FLONASE) 50 MCG/ACT nasal spray Place 2 sprays into both nostrils daily. 12/02/22   Larose Kells, MD  fluticasone furoate-vilanterol (BREO ELLIPTA) 200-25 MCG/ACT AEPB Inhale 1 puff into the lungs once daily. 11/03/22   Margaretha Seeds, MD  furosemide (LASIX) 20 MG tablet Take 1 tablet (20 mg total) by mouth daily. 03/10/22   [name], MD  hydrALAZINE (APRESOLINE) 10 MG tablet Take 1 tablet (10 mg total) by mouth 2 (two) times daily. 03/08/22   Ladell Pier, MD  hydrochlorothiazide (HYDRODIURIL) 25 MG tablet Take 1 tablet (25 mg total) by mouth daily. 07/23/22   Ladell Pier, MD  hydrOXYzine (ATARAX) 25 MG tablet Take 1 tablet (25 mg total) by mouth every 8 (eight) hours as needed for up to 15 doses for itching. 08/10/22   Wyvonnia Dusky, MD  Olopatadine HCl 0.2 % SOLN Place 1 drop into both eyes daily as needed (allergies). 12/02/22   Larose Kells, MD  omeprazole (PRILOSEC) 20 MG capsule Take 1 capsule (20 mg total) by mouth daily. 09/30/22   Ladell Pier, MD  phentermine 37.5 MG capsule Take 1 capsule (37.5 mg total) by mouth every morning. 12/31/22   Ladell Pier, MD  potassium chloride SA (KLOR-CON M) 20 MEQ tablet Take 4 tablets by mouth today and then  1 tablet by mouth twice daily thereafter. 01/27/23   Imogene Burn, PA-C  Respiratory Therapy Supplies (FLUTTER) DEVI Use as directed 12/01/17   Marshell Garfinkel, MD  sodium chloride (OCEAN) 0.65 % nasal spray Place 2 sprays into both nostrils every 4 (four) hours. Patient taking differently: Place 2 sprays into the nose every 4 (four) hours as needed for congestion. 08/20/21   Idamae Schuller, MD  Tiotropium Bromide Monohydrate (SPIRIVA RESPIMAT) 2.5 MCG/ACT AERS Inhale 2 puffs into the lungs daily. 11/03/22   Margaretha Seeds, MD    Family History Family History  Problem Relation Age of Onset   Cancer Father        prostate   Hypertension Mother  Diabetes Mother    Hypertension Sister    Heart disease Maternal Uncle    Stroke Maternal Grandmother    Hypertension Maternal Grandmother    Hypertension Sister    Colon cancer Neg Hx    Stomach cancer Neg Hx    Rectal cancer Neg Hx    Colon polyps Neg Hx     Social History Social History   Tobacco Use   Smoking status: Former    Packs/day: 0.50    Years: 40.00    Additional pack years: 0.00    Total pack years: 20.00    Types: Cigarettes    Quit date: 07/2021    Years since quitting: 1.5    Passive exposure: Never   Smokeless tobacco: Never   Tobacco comments:    Quit 102022  Vaping Use   Vaping Use: Former  Substance Use Topics   Alcohol use: Yes    Comment: occasional   Drug use: No     Allergies   Sulfa antibiotics, Chantix [varenicline tartrate], Fish allergy, [place]groton [chlorthalidone], Penicillins, and Latex   Review of Systems Review of Systems  Constitutional:  Negative for fever.  Respiratory:  Positive for cough.   Cardiovascular:  Negative for chest pain.  All other systems reviewed and are negative.    Physical Exam Triage Vital Signs ED Triage Vitals [01/29/23 1133]  Enc Vitals Group     BP 122/81     Pulse Rate 79     Resp 18     Temp 98 F (36.7 C)     Temp Source Oral     SpO2 94 %      Weight      Height      Head Circumference      Peak Flow      Pain Score 4     Pain Loc      Pain Edu?      Excl. in [place]?    No data found.  Updated Vital Signs BP 122/81 (BP Location: Left Arm)   Pulse 79   Temp 98 F (36.7 C) (Oral)   Resp 18   SpO2 94%   Visual Acuity Right Eye Distance:   Left Eye Distance:   Bilateral Distance:    Right Eye Near:   Left Eye Near:    Bilateral Near:     Physical Exam Vitals and nursing note reviewed.  Constitutional:      Appearance: She is well-developed.  HENT:     Head: Normocephalic.  Cardiovascular:     Rate and Rhythm: Normal rate.  Pulmonary:     Effort: Pulmonary effort is normal.  Abdominal:     General: There is no distension.  Musculoskeletal:        General: Normal range of motion.     Cervical back: Normal range of motion.  Skin:    General: Skin is warm.  Neurological:     General: No focal deficit present.     Mental Status: She is alert and oriented to person, place, and time.      UC Treatments / Results  Labs (all labs ordered are listed, but only abnormal results are displayed) Labs Reviewed - No data to display  EKG   Radiology No results found.  Procedures Procedures (including critical care time)  Medications Ordered in UC Medications - No data to display  Initial Impression / Assessment and Plan / UC Course  I have reviewed the triage vital signs and the  nursing notes.  Pertinent labs & imaging results that were available during my care of the patient were reviewed by me and considered in my medical decision making (see chart for details).      Final Clinical Impressions(s) / UC Diagnoses   Final diagnoses:  Acute bronchitis, unspecified organism     Discharge Instructions      Return if any problems    ED Prescriptions     Medication Sig Dispense Auth. Provider   doxycycline (VIBRAMYCIN) 100 MG capsule Take 1 capsule (100 mg total) by mouth 2 (two) times daily. 20  capsule Holden Maniscalco K, [place]   benzonatate (TESSALON PERLES) 100 MG capsule Take 1 capsule (100 mg total) by mouth every 6 (six) hours as needed for cough. 30 capsule Fransico Meadow, [place]      PDMP not reviewed this encounter.    An After Visit Summary was printed and given to the patient.    Fransico Meadow, [place] 01/30/23 1827

## 2023-01-31 ENCOUNTER — Telehealth: Payer: Self-pay | Admitting: Pharmacist

## 2023-01-31 NOTE — Telephone Encounter (Signed)
Patient attempted to be outreached by Toma Aran, PharmD Candidate on 01/31/2023 to discuss hypertension. Left voicemail for patient to return our call at their convenience at [phone].   Toma Aran, PharmD Candidate   Catie Hedwig Morton, PharmD, [place], [place] [phone]

## 2023-02-01 ENCOUNTER — Other Ambulatory Visit: Payer: Self-pay | Admitting: Internal Medicine

## 2023-02-01 ENCOUNTER — Other Ambulatory Visit (HOSPITAL_COMMUNITY): Payer: Self-pay

## 2023-02-01 DIAGNOSIS — I10 Essential (primary) hypertension: Secondary | ICD-10-CM

## 2023-02-01 MED ORDER — HYDRALAZINE HCL 10 MG PO TABS
10.0000 mg | ORAL_TABLET | Freq: Two times a day (BID) | ORAL | 0 refills | Status: DC
  Filled 2023-02-01: qty 60, 30d supply, fill #0

## 2023-02-01 MED ORDER — ALBUTEROL SULFATE HFA 108 (90 BASE) MCG/ACT IN AERS
2.0000 | INHALATION_SPRAY | RESPIRATORY_TRACT | 0 refills | Status: DC | PRN
  Filled 2023-02-01: qty 6.7, 17d supply, fill #0

## 2023-02-01 MED ORDER — HYDROCHLOROTHIAZIDE 25 MG PO TABS
25.0000 mg | ORAL_TABLET | Freq: Every day | ORAL | 0 refills | Status: DC
Start: 2023-02-01 — End: 2023-03-18
  Filled 2023-02-01: qty 30, 30d supply, fill #0

## 2023-02-02 ENCOUNTER — Ambulatory Visit: Payer: Self-pay

## 2023-02-02 ENCOUNTER — Other Ambulatory Visit: Payer: Self-pay

## 2023-02-02 ENCOUNTER — Telehealth: Payer: 59 | Admitting: Family Medicine

## 2023-02-02 DIAGNOSIS — J209 Acute bronchitis, unspecified: Secondary | ICD-10-CM | POA: Diagnosis not present

## 2023-02-02 DIAGNOSIS — N393 Stress incontinence (female) (male): Secondary | ICD-10-CM | POA: Diagnosis not present

## 2023-02-02 MED ORDER — PREDNISONE 20 MG PO TABS
40.0000 mg | ORAL_TABLET | Freq: Every day | ORAL | 0 refills | Status: AC
Start: 2023-02-02 — End: 2023-02-08
  Filled 2023-02-02: qty 10, 5d supply, fill #0

## 2023-02-02 MED ORDER — OXYBUTYNIN CHLORIDE ER 5 MG PO TB24
5.0000 mg | ORAL_TABLET | Freq: Every day | ORAL | 0 refills | Status: AC
Start: 2023-02-02 — End: ?
  Filled 2023-02-02: qty 30, 30d supply, fill #0

## 2023-02-02 NOTE — Patient Instructions (Signed)
[name], thank you for joining Perlie Mayo, NP for today's virtual visit.  While this provider is not your primary care provider (PCP), if your PCP is located in our provider database this encounter information will be shared with them immediately following your visit.   [facility] account gives you access to today's visit and all your visits, tests, and labs performed at [facility] " click here if you don't have a [facility] account or go to [email or web address]  Consent: (Patient) Krystal Allen provided verbal consent for this virtual visit at the beginning of the encounter.  Current Medications:  Current Outpatient Medications:    oxybutynin (DITROPAN XL) 5 MG 24 hr tablet, Take 1 tablet (5 mg total) by mouth at bedtime., Disp: 30 tablet, Rfl: 0   predniSONE (DELTASONE) 20 MG tablet, Take 2 tablets (40 mg total) by mouth daily with breakfast for 5 days., Disp: 10 tablet, Rfl: 0   acetaminophen (TYLENOL 8 HOUR) 650 MG CR tablet, Take 1 tablet (650 mg total) by mouth 2 (two) times daily as needed for pain., Disp: 60 tablet, Rfl: 5   albuterol (PROAIR HFA) 108 (90 Base) MCG/ACT inhaler, Inhale 2 puffs into the lungs every 4 (four) hours as needed for wheezing or shortness of breath., Disp: 6.7 g, Rfl: 0   albuterol (PROVENTIL) (2.5 MG/3ML) 0.083% nebulizer solution, TAKE 3 MLS BY NEBULIZATION EVERY 6 (SIX) HOURS AS NEEDED FOR WHEEZING OR SHORTNESS OF BREATH., Disp: 90 mL, Rfl: 1   aspirin EC 81 MG tablet, Take 1 tablet (81 mg total) by mouth daily., Disp: 100 tablet, Rfl: 1   atorvastatin (LIPITOR) 80 MG tablet, Take 1 tablet (80 mg total) by mouth daily., Disp: 90 tablet, Rfl: 3   benzonatate (TESSALON PERLES) 100 MG capsule, Take 1 capsule (100 mg total) by mouth every 6 (six) hours as needed for cough., Disp: 30 capsule, Rfl: 0   cetirizine (ZYRTEC) 10 MG tablet, Take 1 tablet (10 mg total) by mouth daily., Disp: 30 tablet, Rfl: 11    diphenhydrAMINE (BENADRYL) 25 mg capsule, Take 25-50 mg by mouth every 6 (six) hours as needed for itching or allergies., Disp: , Rfl:    doxycycline (VIBRAMYCIN) 100 MG capsule, Take 1 capsule (100 mg total) by mouth 2 (two) times daily., Disp: 20 capsule, Rfl: 0   Elastic Bandages & Supports ([place]) MISC, R60.0, Disp: 1 each, Rfl: 0   EPINEPHrine 0.3 mg/0.3 mL IJ SOAJ injection, Inject 0.3 mg into the muscle as needed for anaphylaxis., Disp: 2 each, Rfl: 2   Evolocumab (REPATHA SURECLICK) [id number] MG/ML SOAJ, Inject 140 mg into the skin every 14 (fourteen) days., Disp: 6 mL, Rfl: 3   ezetimibe (ZETIA) 10 MG tablet, Take 1 tablet (10 mg total) by mouth daily., Disp: 90 tablet, Rfl: 3   famotidine (PEPCID) 20 MG tablet, Take 1 tablet (20 mg total) by mouth 2 (two) times daily as needed (hives)., Disp: 60 tablet, Rfl: 5   fexofenadine (ALLEGRA ALLERGY) 180 MG tablet, Take 1 tablet (180 mg total) by mouth 2 (two) times daily as needed (hives)., Disp: 60 tablet, Rfl: 5   fluticasone (FLONASE) 50 MCG/ACT nasal spray, Place 2 sprays into both nostrils daily., Disp: 16 g, Rfl: 5   fluticasone furoate-vilanterol (BREO ELLIPTA) 200-25 MCG/ACT AEPB, Inhale 1 puff into the lungs once daily., Disp: 60 each, Rfl: 5   furosemide (LASIX) 20 MG tablet, Take 1 tablet (20 mg total) by mouth daily.,  Disp: 90 tablet, Rfl: 3   hydrALAZINE (APRESOLINE) 10 MG tablet, Take 1 tablet (10 mg total) by mouth 2 (two) times daily., Disp: 60 tablet, Rfl: 0   hydrochlorothiazide (HYDRODIURIL) 25 MG tablet, Take 1 tablet (25 mg total) by mouth daily., Disp: 30 tablet, Rfl: 0   hydrOXYzine (ATARAX) 25 MG tablet, Take 1 tablet (25 mg total) by mouth every 8 (eight) hours as needed for up to 15 doses for itching., Disp: 15 tablet, Rfl: 0   Olopatadine HCl 0.2 % SOLN, Place 1 drop into both eyes daily as needed (allergies)., Disp: 2.5 mL, Rfl: 5   omeprazole (PRILOSEC) 20 MG capsule, Take 1 capsule (20 mg total) by  mouth daily., Disp: 90 capsule, Rfl: 3   phentermine 37.5 MG capsule, Take 1 capsule (37.5 mg total) by mouth every morning., Disp: 30 capsule, Rfl: 1   potassium chloride SA (KLOR-CON M) 20 MEQ tablet, Take 4 tablets by mouth today and then 1 tablet by mouth twice daily thereafter., Disp: 60 tablet, Rfl: 3   Respiratory Therapy Supplies (FLUTTER) DEVI, Use as directed, Disp: 1 each, Rfl: 0   sodium chloride (OCEAN) 0.65 % nasal spray, Place 2 sprays into both nostrils every 4 (four) hours. (Patient taking differently: Place 2 sprays into the nose every 4 (four) hours as needed for congestion.), Disp: 44 mL, Rfl: 0   Tiotropium Bromide Monohydrate (SPIRIVA RESPIMAT) 2.5 MCG/ACT AERS, Inhale 2 puffs into the lungs daily., Disp: 4 g, Rfl: 5   Medications ordered in this encounter:  Meds ordered this encounter  Medications   predniSONE (DELTASONE) 20 MG tablet    Sig: Take 2 tablets (40 mg total) by mouth daily with breakfast for 5 days.    Dispense:  10 tablet    Refill:  0    Order Specific Question:   Supervising Provider    Answer:   Chase Picket [id number]   oxybutynin (DITROPAN XL) 5 MG 24 hr tablet    Sig: Take 1 tablet (5 mg total) by mouth at bedtime.    Dispense:  30 tablet    Refill:  0    Order Specific Question:   Supervising Provider    Answer:   Chase Picket [id number]     *If you need refills on other medications prior to your next appointment, please contact your pharmacy*  Follow-Up: Call back or seek an in-person evaluation if the symptoms worsen or if the condition fails to improve as anticipated.  [place] [phone]  Other Instructions   Oxybutynin Extended-Release Tablets What is this medication? OXYBUTYNIN (ox i BYOO ti nin) treats symptoms of an overactive bladder, such as loss of bladder control or frequent need to urinate. It works by relaxing muscles in the bladder. It belongs to a group of medications called  antispasmodics. This medicine may be used for other purposes; ask your health care provider or pharmacist if you have questions. COMMON BRAND NAME(S): Ditropan XL What should I tell my care team before I take this medication? They need to know if you have any of these conditions: Autonomic neuropathy Dementia Glaucoma Intestinal obstruction Kidney disease Liver disease Myasthenia gravis Parkinson's disease Trouble passing urine An unusual or allergic reaction to oxybutynin, other medications, foods, dyes, or preservatives Pregnant or trying to get pregnant Breast-feeding How should I use this medication? Take this medication by mouth with a glass of water. Swallow whole, do not crush, cut, or chew. Follow the directions on the  prescription label. You can take this medication with or without food. Take your doses at regular intervals. Do not take your medication more often than directed. Talk to your care team about the use of this medication in children. Special care may be needed. While this medication may be prescribed for children as young as 6 years for selected conditions, precautions do apply. Overdosage: If you think you have taken too much of this medicine contact a poison control center or emergency room at once. NOTE: This medicine is only for you. Do not share this medicine with others. What if I miss a dose? If you miss a dose, take it as soon as you can. If it is almost time for your next dose, take only that dose. Do not take double or extra doses. What may interact with this medication? Antihistamines for allergy, cough, and cold Atropine Certain medications for bladder problems, such as oxybutynin or tolterodine Certain medications for Parkinson disease, such as benztropine or trihexyphenidyl Certain medications for stomach problems, such as dicyclomine or hyoscyamine Certain medications for travel sickness, such as  scopolamine Clarithromycin Erythromycin Ipratropium Medications for fungal infections, such as fluconazole, itraconazole, ketoconazole, or voriconazole This list may not describe all possible interactions. Give your health care provider a list of all the medicines, herbs, non-prescription drugs, or dietary supplements you use. Also tell them if you smoke, drink alcohol, or use illegal drugs. Some items may interact with your medicine. What should I watch for while using this medication? Visit your care team for regular checks on your progress. It may take a few weeks to notice the full benefit from this medication. You may need to limit your intake of tea, coffee, caffeinated sodas, and alcohol. These drinks may make your symptoms worse. This medication may affect your coordination, reaction time, or judgment. Do not drive or operate machinery until you know how this medication affects you. Sit up or stand slowly to reduce the risk of dizzy or fainting spells. Drinking alcohol with this medication can increase the risk of these side effects. Your mouth may get dry. Chewing sugarless gum or sucking hard candy and drinking plenty of water may help. Contact your care team if the problem does not go away or is severe. This medication may cause dry eyes and blurred vision. If you wear contact lenses, you may feel some discomfort. Lubricating eye drops may help. See your care team if the problem does not go away or is severe. You may notice the shells of the tablets in your stool from time to time. This is normal. Avoid extreme heat. This medication can cause you to sweat less than normal. Your body temperature could increase to dangerous levels, which may lead to heat stroke. What side effects may I notice from receiving this medication? Side effects that you should report to your care team as soon as possible: Allergic reactions or angioedema--skin rash, itching, hives, swelling of the face, eyes, lips,  tongue, arms, or legs, trouble swallowing or breathing Sudden eye pain or change in vision such as blurry vision, seeing halos around lights, vision loss Trouble passing urine Side effects that usually do not require medical attention (report to your care team if they continue or are bothersome): Confusion Constipation Dizziness Drowsiness Dry mouth Headache This list may not describe all possible side effects. Call your doctor for medical advice about side effects. You may report side effects to FDA at 1-800-FDA-1088. Where should I keep my medication? Keep out of the  reach of children. Store at room temperature between 15 and 30 degrees C (59 and 86 degrees F). Protect from moisture and humidity. Throw away any unused medication after the expiration date. NOTE: This sheet is a summary. It may not cover all possible information. If you have questions about this medicine, talk to your doctor, pharmacist, or health care provider.  2023 Elsevier/Gold Standard (2022-04-27 00:00:00)    If you have been instructed to have an in-person evaluation today at a local Urgent Care facility, please use the link below. It will take you to a list of all of our available [facility], including address, phone number and hours of operation. Please do not delay care.  [facility]  If you or a family member do not have a primary care provider, use the link below to schedule a visit and establish care. When you choose a [facility] primary care physician or advanced practice provider, you gain a long-term partner in health. Find a Primary Care Provider  Learn more about 's in-office and virtual care options: [facility]

## 2023-02-02 NOTE — Telephone Encounter (Signed)
  Chief Complaint: SOB Symptoms: SOB mild to moderate, cough, nasal and chest congestion, incontinence Frequency: 1 week Pertinent Negatives: NA Disposition: [] ED /[] Urgent Care (no appt availability in office) / [] Appointment(In office/virtual)/ [x]  [facility]/ [] Home Care/ [x] Refused Recommended Disposition /[] [facility]/ []  Follow-up with PCP Additional Notes: pt states she went to [facility] 01/29/23, was given rxs that are not helping. Pt is doing neb txs every night x 2 nights so far and using inhalers. Pt having incontinence d/t cough. Pt was prescribed doxycycline and benzonate that she says feels like not helping at all. Offered appt this afternoon at 1510 with Levada Dy, [place] but pt unable to come in. Scheduled virtual UC appt at 1200 since pt has already went to [facility] and didn't want to go back.   Reason for Disposition  [1] MILD difficulty breathing (e.g., minimal/no SOB at rest, SOB with walking, pulse <100) AND [2] NEW-onset or WORSE than normal  Answer Assessment - Initial Assessment Questions 1. RESPIRATORY STATUS: "Describe your breathing?" (e.g., wheezing, shortness of breath, unable to speak, severe coughing)      SOB 2. ONSET: "When did this breathing problem begin?"      1 week  3. PATTERN "Does the difficult breathing come and go, or has it been constant since it started?"      Comes and goes  4. SEVERITY: "How bad is your breathing?" (e.g., mild, moderate, severe)    - MILD: No SOB at rest, mild SOB with walking, speaks normally in sentences, can lie down, no retractions, pulse < 100.    - MODERATE: SOB at rest, SOB with minimal exertion and prefers to sit, cannot lie down flat, speaks in phrases, mild retractions, audible wheezing, pulse 100-120.    - SEVERE: Very SOB at rest, speaks in single words, struggling to breathe, sitting hunched forward, retractions, pulse > 120      Mild to moderate  7. LUNG HISTORY: "Do you have any history of lung disease?"  (e.g.,  pulmonary embolus, asthma, emphysema)     COPD, bronchitis  8. CAUSE: "What do you think is causing the breathing problem?"      allergies 9. OTHER SYMPTOMS: "Do you have any other symptoms? (e.g., dizziness, runny nose, cough, chest pain, fever)     Cough and incontinence, nasal and chest congestion  Protocols used: Breathing Difficulty-A-AH

## 2023-02-02 NOTE — Telephone Encounter (Signed)
Patient seen by Perlie Mayo, NP today. See notes

## 2023-02-02 NOTE — Progress Notes (Signed)
Virtual Visit Consent   Krystal Allen, you are scheduled for a virtual visit with a [facility] provider today. Just as with appointments in the office, your consent must be obtained to participate. Your consent will be active for this visit and any virtual visit you may have with one of our providers in the next 365 days. If you have a MyChart account, a copy of this consent can be sent to you electronically.  As this is a virtual visit, video technology does not allow for your provider to perform a traditional examination. This may limit your provider's ability to fully assess your condition. If your provider identifies any concerns that need to be evaluated in person or the need to arrange testing (such as labs, EKG, etc.), we will make arrangements to do so. Although advances in technology are sophisticated, we cannot ensure that it will always work on either your end or our end. If the connection with a video visit is poor, the visit may have to be switched to a telephone visit. With either a video or telephone visit, we are not always able to ensure that we have a secure connection.  By engaging in this virtual visit, you consent to the provision of healthcare and authorize for your insurance to be billed (if applicable) for the services provided during this visit. Depending on your insurance coverage, you may receive a charge related to this service.  I need to obtain your verbal consent now. Are you willing to proceed with your visit today? Krystal Allen has provided verbal consent on 02/02/2023 for a virtual visit (video or telephone). Perlie Mayo, NP  Date: 02/02/2023 12:02 PM  Virtual Visit via Video Note   I, Perlie Mayo, connected with  Krystal Allen  ([id number], 1956-08-13) on 02/02/23 at 12:00 PM EDT by a video-enabled telemedicine application and verified that I am speaking with the correct person using two identifiers.  Location: Patient: Virtual Visit Location Patient:  Home Provider: Virtual Visit Location Provider: Home Office   I discussed the limitations of evaluation and management by telemedicine and the availability of in person appointments. The patient expressed understanding and agreed to proceed.    History of Present Illness: Krystal Allen is a 67 y.o. who identifies as a female who was assigned female at birth, and is being seen today for on going and worsening cough and incontinence related to cough.  Onset was 01/25/2023 was seen at [facility] on 01/29/23 and given Doxy and Perles for bronchitis which are not improving her much. Associated symptoms are cough-dry and stress incontinence.  Modifying factors are Doxy and Perles without much improvement over the last 4 days Denies chest pain, shortness of breath, fevers, chills History of smoking stopped around 2 years ago per pt  Problems:  Patient Active Problem List   Diagnosis Date Noted   Morbid obesity 03/09/2022   Seasonal allergies 02/07/2022   Mastitis, right, acute 12/23/2021   Influenza vaccine needed 09/14/2020   Tobacco dependence 05/12/2020   Aortic atherosclerosis 05/12/2020   Adenomatous polyp of colon 05/12/2020   Langerhan's cell histiocytosis 01/26/2018   Cigarette smoker 01/26/2018   Osteoarthritis of cervical spine 12/22/2017   Prediabetes 12/22/2017   Pain of left calf 04/06/2017   Centrilobular emphysema 02/23/2016   Obesity (BMI 30-39.9) 01/15/2016   Bilateral shoulder pain 01/15/2016   Esophageal reflux 01/15/2016   Tobacco abuse 05/13/2015   Lung nodule 08/14/2014   Impaired fasting glucose 08/14/2014  Hyperlipidemia 08/14/2014   Personal history of colonic adenomas 06/25/2013   HTN (hypertension) 05/20/2013    Allergies:  Allergies  Allergen Reactions   Sulfa Antibiotics Anaphylaxis and Hives   Chantix [Varenicline Tartrate]     Bad dreams   Fish Allergy Other (See Comments)    Severe stomach cramps    Hygroton [Chlorthalidone]    Penicillins Other (See  Comments)    Has patient had a PCN reaction causing immediate rash, facial/tongue/throat swelling, SOB or lightheadedness with hypotension: No Has patient had a PCN reaction causing severe rash involving mucus membranes or skin necrosis: No Has patient had a PCN reaction that required hospitalization No Has patient had a PCN reaction occurring within the last 10 years: No If all of the above answers are "NO", then may proceed with Cephalosporin use.  Welts    Latex Rash and Other (See Comments)    Dry skin   Medications:  Current Outpatient Medications:    acetaminophen (TYLENOL 8 HOUR) 650 MG CR tablet, Take 1 tablet (650 mg total) by mouth 2 (two) times daily as needed for pain., Disp: 60 tablet, Rfl: 5   albuterol (PROAIR HFA) 108 (90 Base) MCG/ACT inhaler, Inhale 2 puffs into the lungs every 4 (four) hours as needed for wheezing or shortness of breath., Disp: 8.5 g, Rfl: 0   albuterol (PROVENTIL) (2.5 MG/3ML) 0.083% nebulizer solution, TAKE 3 MLS BY NEBULIZATION EVERY 6 (SIX) HOURS AS NEEDED FOR WHEEZING OR SHORTNESS OF BREATH., Disp: 90 mL, Rfl: 1   aspirin EC 81 MG tablet, Take 1 tablet (81 mg total) by mouth daily., Disp: 100 tablet, Rfl: 1   atorvastatin (LIPITOR) 80 MG tablet, Take 1 tablet (80 mg total) by mouth daily., Disp: 90 tablet, Rfl: 3   benzonatate (TESSALON PERLES) 100 MG capsule, Take 1 capsule (100 mg total) by mouth every 6 (six) hours as needed for cough., Disp: 30 capsule, Rfl: 0   cetirizine (ZYRTEC) 10 MG tablet, Take 1 tablet (10 mg total) by mouth daily., Disp: 30 tablet, Rfl: 11   diphenhydrAMINE (BENADRYL) 25 mg capsule, Take 25-50 mg by mouth every 6 (six) hours as needed for itching or allergies., Disp: , Rfl:    doxycycline (VIBRAMYCIN) 100 MG capsule, Take 1 capsule (100 mg total) by mouth 2 (two) times daily., Disp: 20 capsule, Rfl: 0   Elastic Bandages & Supports (Elida) MISC, R60.0, Disp: 1 each, Rfl: 0   EPINEPHrine 0.3 mg/0.3 mL  IJ SOAJ injection, Inject 0.3 mg into the muscle as needed for anaphylaxis., Disp: 2 each, Rfl: 2   Evolocumab (REPATHA SURECLICK) [id number] MG/ML SOAJ, Inject 140 mg into the skin every 14 (fourteen) days., Disp: 6 mL, Rfl: 3   ezetimibe (ZETIA) 10 MG tablet, Take 1 tablet (10 mg total) by mouth daily., Disp: 90 tablet, Rfl: 3   famotidine (PEPCID) 20 MG tablet, Take 1 tablet (20 mg total) by mouth 2 (two) times daily as needed (hives)., Disp: 60 tablet, Rfl: 5   fexofenadine (ALLEGRA ALLERGY) 180 MG tablet, Take 1 tablet (180 mg total) by mouth 2 (two) times daily as needed (hives)., Disp: 60 tablet, Rfl: 5   fluticasone (FLONASE) 50 MCG/ACT nasal spray, Place 2 sprays into both nostrils daily., Disp: 16 g, Rfl: 5   fluticasone furoate-vilanterol (BREO ELLIPTA) 200-25 MCG/ACT AEPB, Inhale 1 puff into the lungs once daily., Disp: 60 each, Rfl: 5   furosemide (LASIX) 20 MG tablet, Take 1 tablet (20 mg total) by mouth daily.,  Disp: 90 tablet, Rfl: 3   hydrALAZINE (APRESOLINE) 10 MG tablet, Take 1 tablet (10 mg total) by mouth 2 (two) times daily., Disp: 60 tablet, Rfl: 0   hydrochlorothiazide (HYDRODIURIL) 25 MG tablet, Take 1 tablet (25 mg total) by mouth daily., Disp: 30 tablet, Rfl: 0   hydrOXYzine (ATARAX) 25 MG tablet, Take 1 tablet (25 mg total) by mouth every 8 (eight) hours as needed for up to 15 doses for itching., Disp: 15 tablet, Rfl: 0   Olopatadine HCl 0.2 % SOLN, Place 1 drop into both eyes daily as needed (allergies)., Disp: 2.5 mL, Rfl: 5   omeprazole (PRILOSEC) 20 MG capsule, Take 1 capsule (20 mg total) by mouth daily., Disp: 90 capsule, Rfl: 3   phentermine 37.5 MG capsule, Take 1 capsule (37.5 mg total) by mouth every morning., Disp: 30 capsule, Rfl: 1   potassium chloride SA (KLOR-CON M) 20 MEQ tablet, Take 4 tablets by mouth today and then 1 tablet by mouth twice daily thereafter., Disp: 60 tablet, Rfl: 3   Respiratory Therapy Supplies (FLUTTER) DEVI, Use as directed, Disp: 1 each, Rfl:  0   sodium chloride (OCEAN) 0.65 % nasal spray, Place 2 sprays into both nostrils every 4 (four) hours. (Patient taking differently: Place 2 sprays into the nose every 4 (four) hours as needed for congestion.), Disp: 44 mL, Rfl: 0   Tiotropium Bromide Monohydrate (SPIRIVA RESPIMAT) 2.5 MCG/ACT AERS, Inhale 2 puffs into the lungs daily., Disp: 4 g, Rfl: 5  Observations/Objective: Patient is well-developed, well-nourished in no acute distress.  Resting comfortably  at home.  Head is normocephalic, atraumatic.  No labored breathing.  Speech is clear and coherent with logical content.  Patient is alert and oriented at baseline.    Assessment and Plan:  1. Acute bronchitis  - predniSONE (DELTASONE) 20 MG tablet; Take 2 tablets (40 mg total) by mouth daily with breakfast for 5 days.  Dispense: 10 tablet; Refill: 0   -continue doxy and perles, adding pred given COPD history and no improvement in cough last 4 days of treatment   2. Stress incontinence in female  - oxybutynin (DITROPAN XL) 5 MG 24 hr tablet; Take 1 tablet (5 mg total) by mouth at bedtime.  Dispense: 30 tablet; Refill: 0  -she is advised that this will improve with cough  improvement and pelvic exercises -kegels discussed, pt desired oral help from medication she used for this same issue a few years back- on char review start of Ditropan was given  -advised will start but much follow up with PCP for on going needs, -Discussed side effects as well  Reviewed side effects, risks and benefits of medication.    Patient acknowledged agreement and understanding of the plan.   Past Medical, Surgical, Social History, Allergies, and Medications have been Reviewed.   Follow Up Instructions: I discussed the assessment and treatment plan with the patient. The patient was provided an opportunity to ask questions and all were answered. The patient agreed with the plan and demonstrated an understanding of the instructions.  A copy of  instructions were sent to the patient via MyChart unless otherwise noted below.    The patient was advised to call back or seek an in-person evaluation if the symptoms worsen or if the condition fails to improve as anticipated.  Time:  I spent 10 minutes with the patient via telehealth technology discussing the above problems/concerns.    Perlie Mayo, NP

## 2023-02-03 ENCOUNTER — Other Ambulatory Visit: Payer: Self-pay

## 2023-02-03 ENCOUNTER — Telehealth: Payer: Self-pay | Admitting: Internal Medicine

## 2023-02-03 NOTE — Telephone Encounter (Signed)
Pt c/o medication issue:  1. Name of Medication:   potassium chloride SA (KLOR-CON M) 20 MEQ tablet    2. How are you currently taking this medication (dosage and times per day)? Take 4 tablets by mouth today and then 1 tablet by mouth twice daily thereafter   3. Are you having a reaction (difficulty breathing--STAT)? No  4. What is your medication issue?  Pt states since starting medication she has been having tongue discomfort as well as rawness. She would like a callback regarding this matter. Please advise

## 2023-02-03 NOTE — Telephone Encounter (Signed)
Called and left detailed message (DPR) Adv of information from PharmD. Per chart patient was recently prescribed doxycycline.  I adv to take benadryl and contact PCP office, even if office is closed by the time she gets the message.

## 2023-02-03 NOTE — Telephone Encounter (Signed)
This does not sound like an adverse effect to Potassium, unless she is having a type of allergic reaction

## 2023-02-06 ENCOUNTER — Emergency Department (HOSPITAL_COMMUNITY)
Admission: EM | Admit: 2023-02-06 | Discharge: 2023-02-06 | Disposition: A | Discharge status: Home / Self Care | Payer: 59 | Attending: Emergency Medicine | Admitting: Emergency Medicine

## 2023-02-06 ENCOUNTER — Ambulatory Visit: Payer: Self-pay | Admitting: *Deleted

## 2023-02-06 ENCOUNTER — Other Ambulatory Visit (HOSPITAL_COMMUNITY): Payer: Self-pay

## 2023-02-06 ENCOUNTER — Ambulatory Visit (HOSPITAL_BASED_OUTPATIENT_CLINIC_OR_DEPARTMENT_OTHER): Admission: RE | Admit: 2023-02-06 | Payer: 59 | Source: Ambulatory Visit

## 2023-02-06 DIAGNOSIS — J449 Chronic obstructive pulmonary disease, unspecified: Secondary | ICD-10-CM | POA: Insufficient documentation

## 2023-02-06 DIAGNOSIS — I10 Essential (primary) hypertension: Secondary | ICD-10-CM | POA: Diagnosis not present

## 2023-02-06 DIAGNOSIS — R220 Localized swelling, mass and lump, head: Secondary | ICD-10-CM | POA: Diagnosis present

## 2023-02-06 DIAGNOSIS — T783XXA Angioneurotic edema, initial encounter: Secondary | ICD-10-CM | POA: Insufficient documentation

## 2023-02-06 DIAGNOSIS — B370 Candidal stomatitis: Secondary | ICD-10-CM | POA: Diagnosis not present

## 2023-02-06 LAB — COMPREHENSIVE METABOLIC PANEL
ALT: 24 U/L (ref 0–44)
AST: 21 U/L (ref 15–41)
Albumin: 2.7 g/dL — ABNORMAL LOW (ref 3.5–5.0)
Alkaline Phosphatase: 137 U/L — ABNORMAL HIGH (ref 38–126)
Anion gap: 8 (ref 5–15)
BUN: 16 mg/dL (ref 8–23)
CO2: 26 mmol/L (ref 22–32)
Calcium: 8.5 mg/dL — ABNORMAL LOW (ref 8.9–10.3)
Chloride: 105 mmol/L (ref 98–111)
Creatinine, Ser: 1.09 mg/dL — ABNORMAL HIGH (ref 0.44–1.00)
GFR, Estimated: 56 mL/min — ABNORMAL LOW (ref >60)
Glucose, Bld: 127 mg/dL — ABNORMAL HIGH (ref 70–99)
Potassium: 3.2 mmol/L — ABNORMAL LOW (ref 3.5–5.1)
Sodium: 139 mmol/L (ref 135–145)
Total Bilirubin: 0.5 mg/dL (ref 0.3–1.2)
Total Protein: 6.9 g/dL (ref 6.5–8.1)

## 2023-02-06 LAB — CBC WITH DIFFERENTIAL/PLATELET
Abs Immature Granulocytes: 0.02 10*3/uL (ref 0.00–0.07)
Basophils Absolute: 0 10*3/uL (ref 0.0–0.1)
Basophils Relative: 0 %
Eosinophils Absolute: 0.1 10*3/uL (ref 0.0–0.5)
Eosinophils Relative: 2 %
HCT: 39.5 % (ref 36.0–46.0)
Hemoglobin: 13.2 g/dL (ref 12.0–15.0)
Immature Granulocytes: 0 %
Lymphocytes Relative: 31 %
Lymphs Abs: 2.2 10*3/uL (ref 0.7–4.0)
MCH: 29.1 pg (ref 26.0–34.0)
MCHC: 33.4 g/dL (ref 30.0–36.0)
MCV: 87.2 fL (ref 80.0–100.0)
Monocytes Absolute: 0.5 10*3/uL (ref 0.1–1.0)
Monocytes Relative: 6 %
Neutro Abs: 4.3 10*3/uL (ref 1.7–7.7)
Neutrophils Relative %: 61 %
Platelets: 280 10*3/uL (ref 150–400)
RBC: 4.53 MIL/uL (ref 3.87–5.11)
RDW: 18.4 % — ABNORMAL HIGH (ref 11.5–15.5)
WBC: 7.2 10*3/uL (ref 4.0–10.5)
nRBC: 0 % (ref 0.0–0.2)

## 2023-02-06 MED ORDER — NYSTATIN 100000 UNIT/ML MT SUSP
500000.0000 [IU] | Freq: Four times a day (QID) | OROMUCOSAL | 0 refills | Status: AC
  Filled 2023-02-07: qty 120, 6d supply, fill #0

## 2023-02-06 NOTE — ED Triage Notes (Signed)
Patient here for evaluation of lower lip swelling and "feeling like something is stuck in her throat" since yesterday. Patient states she is unsure of what she might be allergic to but states she used an epi-pen yesterday afternoon. Patient is alert, oriented, swallowing oral secretions without difficulty, and is ambulating independently with steady gait.

## 2023-02-06 NOTE — ED Provider Notes (Signed)
Emergency Department Provider Note   I have reviewed the triage vital signs and the nursing notes.   HISTORY  Chief Complaint Allergic Reaction   HPI Krystal Allen is a 67 y.o. female with past history of COPD, hypertension, hyperlipidemia presents emergency department with lower lip swelling.  Symptoms began 2 to 3 days prior with maximum swelling yesterday which is since improved.  Some sensation of throat tightness earlier but not currently.  She is able to swallow solids and liquids without difficulty.  No shortness of breath.  No tongue swelling.  Notes history of this in the past and tried her EpiPen yesterday with little improvement.  No rash.  No new medications.  She is not taking any ACE/ARB medications.    Past Medical History:  Diagnosis Date   Allergy    COPD (chronic obstructive pulmonary disease)    Depression    GERD (gastroesophageal reflux disease)    Hernia, abdominal    History of chicken pox    Hyperlipidemia    Hypertension    Internal hemorrhoids with Grade 3 prolapse and bleeding 06/19/2013   Osteoarthritis    Personal history of colonic adenomas 06/25/2013    Review of Systems  Constitutional: No fever/chills. Positive lower lip swelling.  Cardiovascular: Denies chest pain. Respiratory: Denies shortness of breath. Gastrointestinal: No abdominal pain.  No nausea, no vomiting.  No diarrhea.  No constipation. Genitourinary: Negative for dysuria. Musculoskeletal: Negative for back pain. Skin: Negative for rash. Neurological: Negative for headaches, focal weakness or numbness.   ____________________________________________   PHYSICAL EXAM:  VITAL SIGNS: ED Triage Vitals  Enc Vitals Group     BP 02/06/23 1020 125/80     Pulse Rate 02/06/23 1020 73     Resp 02/06/23 1020 16     Temp 02/06/23 1020 97.9 F (36.6 C)     Temp Source 02/06/23 1020 Oral     SpO2 02/06/23 1020 95 %   Constitutional: Alert and oriented. Well appearing and in no  acute distress. Eyes: Conjunctivae are normal.  Head: Atraumatic. Nose: No congestion/rhinnorhea. Mouth/Throat: Mucous membranes are moist.  Oropharynx non-erythematous. Mild lip swelling of the lower lip. No tongue swelling. Widely patent oropharynx.  Neck: No stridor.   Cardiovascular: Normal rate, regular rhythm. Good peripheral circulation. Grossly normal heart sounds.   Respiratory: Normal respiratory effort.  No retractions. Lungs CTAB. Gastrointestinal: Soft and nontender. No distention.  Musculoskeletal: No lower extremity tenderness nor edema. No gross deformities of extremities. Neurologic:  Normal speech and language. No gross focal neurologic deficits are appreciated.  Skin:  Skin is warm, dry and intact. No rash noted.  ____________________________________________   LABS (all labs ordered are listed, but only abnormal results are displayed)  Labs Reviewed  COMPREHENSIVE METABOLIC PANEL - Abnormal; Notable for the following components:      Result Value   Potassium 3.2 (*)    Glucose, Bld 127 (*)    Creatinine, Ser 1.09 (*)    Calcium 8.5 (*)    Albumin 2.7 (*)    Alkaline Phosphatase 137 (*)    GFR, Estimated 56 (*)    All other components within normal limits  CBC WITH DIFFERENTIAL/PLATELET - Abnormal; Notable for the following components:   RDW 18.4 (*)    All other components within normal limits   ____________________________________________   PROCEDURES  Procedure(s) performed:   Procedures  None  ____________________________________________   INITIAL IMPRESSION / ASSESSMENT AND PLAN / ED COURSE  Pertinent labs & imaging  results that were available during my care of the patient were reviewed by me and considered in my medical decision making (see chart for details).   This patient is Presenting for Evaluation of lip swelling, which does require a range of treatment options, and is a complaint that involves a high risk of morbidity and  mortality.  The Differential Diagnoses include anaphylaxis, angioedema, acute allergic reaction, etc.    Clinical Laboratory Tests Ordered, included CBC without leukocytosis or anemia.  No acute kidney injury.   Medical Decision Making: Summary:  Presents emergency department with isolated lower lip swelling.  She does not appear to be on ACE or ARB medications to suspect ACE induced angioedema but pattern seems most consistent with that.  Considered hereditary angioedema as a possibility.  Her lip swelling has reduced significantly since yesterday.  She does have pictures on her phone where the lip looks much more swollen.  I do not have any concern for deeper space swelling/infection to prompt neck imaging.  She follows with an allergist as well as her PCP.  She has EpiPen's at home.  Discussed close follow-up with her allergy team and strict ED return precautions should symptoms return.  Patient's presentation is most consistent with acute, uncomplicated illness.   Disposition: discharge  ____________________________________________  FINAL CLINICAL IMPRESSION(S) / ED DIAGNOSES  Final diagnoses:  Angioedema, initial encounter  Thrush, oral     NEW OUTPATIENT MEDICATIONS STARTED DURING THIS VISIT:  Discharge Medication List as of 02/06/2023 12:44 PM     START taking these medications   Details  nystatin (MYCOSTATIN) 100000 UNIT/ML suspension Take 5 mLs (500,000 Units total) by mouth 4 (four) times daily for 7 days., Starting Mon 02/06/2023, Until Mon 02/13/2023, Normal        Note:  This document was prepared using Dragon voice recognition software and may include unintentional dictation errors.  Alona Bene, MD, [facility]    Yousef Huge, Arlyss Repress, MD 02/07/23 1019

## 2023-02-06 NOTE — ED Notes (Signed)
Patient reports that her tongue felt raw on Thursday and she reported this to her provider d/t recently being prescribed potassium. Patient reports that when she woke up around 9am she noticed her lip was significantly swollen, making it difficult to drink but not swallow. Patient took an allergy pill yesterday afternoon around 2:30, then used her epi-pen yesterday afternoon around 3:30p. Patient reports the epipen helped with the swelling in her lip and the raw feeling on her tongue but the symptoms did not resolve completely. She was also recently prescribed an antibiotic and benzonatate and noticed that she has developed thrush on her tongue this morning.

## 2023-02-06 NOTE — Discharge Instructions (Signed)
Please continue to follow with your primary care doctor.  Retreating for thrush.  Make sure you have your EpiPen's available.  If you do have to give yourself an EpiPen please come to the emergency department immediately for evaluation.

## 2023-02-06 NOTE — ED Notes (Signed)
Pt refused to lay in hallway bed. Pt stated "I ain't laying out here in the hallway leave me in wheelchair." RN notified.

## 2023-02-06 NOTE — Telephone Encounter (Signed)
Reason for Disposition  [1] Had epinephrine shot AND [2] no symptoms now    Not wanting to go to ED.  Answer Assessment - Initial Assessment Questions 1. SYMPTOMMy lip is swollen and my lips are still swollen and I have thrush in my mouth.    I was eating and something was burning my mouth.    My mouth was burning last Thur.   My tongue was raw and burning.   I'm taking potassium pills.    My mouth is still swollen this morning.: "What is the most serious symptom?"     No new medicine.   I've taking pills for cough Tessalon Perles, doxycycline for a bad cold and potassium are new medications.   No reaction to doxycycline before.   I'm allergic to something. I used my Epi Pen 3:30 PM yesterday.    I took the allergy pill that was prescribed for me first.   I was short of breath and had swelling in mouth. 2. AIRWAY: "Are they breathing?" (e.g., Yes, No)  (R/O: airway blockage, respiratory arrest)      Yes no shortness of breath this morning 3. BREATHING: "Is there difficulty breathing?" (e.g., Yes, No; wheezing, unable to complete a sentence)  (R/O: respiratory distress)     No 4. CIRCULATION: "Are you feeling weak?"  (e.g., Yes, No, Unknown; severity)  (R/O: shock) If Yes, ask: "Can you stand and walk normally?"     I feel my mouth is heavy.   5. SWALLOWING: "Can you swallow?" (e.g.,Yes, No; food, fluid, saliva)      Something feels stuck in my throat.    I just got up so I have not eaten or had a drink this morning anything. 6. ONSET: "When did the reaction start?" (Minutes or hours ago)      Yesterday 7. SUBSTANCE: "What are you reacting to?" "When did the contact occur?"      I not sure. 8. PREVIOUS REACTION: "Have you ever reacted to it before?" If Yes, ask: "What happened that time?"     Not sure what I'm reacting to. 9. EPINEPHRINE: "Do you have an epinephrine autoinjector (e.g., EpiPen)?"     Yes I took it yesterday at 3:30 Pm  Protocols used: Anaphylaxis-A-AH

## 2023-02-06 NOTE — ED Notes (Signed)
Pt stated she will not receive care in the hallway d.t not feeling it is appropriate to discuss care while others are walking by. Pt stated she will wait in the lobby until there is a room available.

## 2023-02-06 NOTE — Telephone Encounter (Signed)
  Chief Complaint: Lips are swollen and throat feels like something is stuck in it after having a reaction to something yesterday where she had to administer her Epi Pen.   She is also c/o having thrush in her mouth with burning. Symptoms: Thrush in mouth, lips swollen and feeling like something is stuck in her throat.   The reaction happened yesterday at 3:30 PM is when she took her Epi Pen.    Last Thur. She was c/o burning on her tongue. Frequency: Yesterday Pertinent Negatives: Patient denies shortness of breath or chest pain. Disposition: [x] ED /[] Urgent Care (no appt availability in office) / [] Appointment(In office/virtual)/ []  [facility]/ [] Home Care/ [] Refused Recommended Disposition /[] [place] [facility]/ []  Follow-up with PCP Additional Notes: Referred to the ED.   At first she did not want to go due to the long wait but she was finally agreeable to going to [facility] ED now.

## 2023-02-06 NOTE — Telephone Encounter (Signed)
Patient arrived at ed at 10:14am

## 2023-02-07 ENCOUNTER — Other Ambulatory Visit (HOSPITAL_COMMUNITY): Payer: Self-pay

## 2023-02-10 ENCOUNTER — Ambulatory Visit: Payer: 59 | Attending: Internal Medicine

## 2023-02-10 DIAGNOSIS — E876 Hypokalemia: Secondary | ICD-10-CM | POA: Diagnosis not present

## 2023-02-10 DIAGNOSIS — Z79899 Other long term (current) drug therapy: Secondary | ICD-10-CM | POA: Diagnosis not present

## 2023-02-11 LAB — BASIC METABOLIC PANEL
BUN/Creatinine Ratio: 11 — ABNORMAL LOW (ref 12–28)
BUN: 17 mg/dL (ref 8–27)
CO2: 25 mmol/L (ref 20–29)
Calcium: 9.1 mg/dL (ref 8.7–10.3)
Chloride: 100 mmol/L (ref 96–106)
Creatinine, Ser: 1.5 mg/dL — ABNORMAL HIGH (ref 0.57–1.00)
Glucose: 123 mg/dL — ABNORMAL HIGH (ref 70–99)
Potassium: 3.5 mmol/L (ref 3.5–5.2)
Sodium: 141 mmol/L (ref 134–144)
eGFR: 38 mL/min/{1.73_m2} — ABNORMAL LOW (ref >59)

## 2023-02-14 ENCOUNTER — Telehealth: Payer: Self-pay

## 2023-02-14 DIAGNOSIS — Z79899 Other long term (current) drug therapy: Secondary | ICD-10-CM

## 2023-02-14 NOTE — Telephone Encounter (Signed)
Patient stated while speaking with someone earlier today she was unable to complete the call. She is follow up with the medical assistant. Will forward to MA.

## 2023-02-14 NOTE — Telephone Encounter (Signed)
Patient states she is returning call °

## 2023-02-14 NOTE — Telephone Encounter (Signed)
-----   Message from Dyann Kief, PA-C sent at 02/13/2023  7:50 AM EDT ----- Kidney function up. My note says she's on lasix 20 mg bid but med lists says once daily. Can you verify? If she's on bid please reduce to once daily. If she is on once daily please change it to prn for weight gain 2-3 lbs overnight or increase edema.continue same dose K. Repeat bmet in 1 week. thanks

## 2023-02-14 NOTE — Telephone Encounter (Signed)
Patient states that she has been taking Lasix twice a day but is agreeable to decreasing lasix to once a day. Patient states that she has not taking potassium in over a week due to what she thought was a allergic reaction. Patient states that she had swelling in her mouth. Patient scheduled to come in for repeat labs on 4/24

## 2023-02-15 ENCOUNTER — Other Ambulatory Visit: Payer: Self-pay | Admitting: Internal Medicine

## 2023-02-15 MED ORDER — ALBUTEROL SULFATE HFA 108 (90 BASE) MCG/ACT IN AERS
2.0000 | INHALATION_SPRAY | RESPIRATORY_TRACT | 0 refills | Status: DC | PRN
  Filled 2023-02-15: qty 6.7, 17d supply, fill #0

## 2023-02-15 NOTE — Telephone Encounter (Signed)
Returned patient call. Pt states that she will resume potassium but only if she can take it once a day. Per Jacolyn Reedy PA-C we recommend that patient takes it twice a day.

## 2023-02-16 ENCOUNTER — Other Ambulatory Visit: Payer: Self-pay

## 2023-02-16 ENCOUNTER — Other Ambulatory Visit (HOSPITAL_COMMUNITY): Payer: Self-pay

## 2023-02-17 ENCOUNTER — Ambulatory Visit (HOSPITAL_BASED_OUTPATIENT_CLINIC_OR_DEPARTMENT_OTHER): Payer: Medicare Other | Admitting: Internal Medicine

## 2023-02-20 ENCOUNTER — Encounter: Payer: Self-pay | Admitting: Internal Medicine

## 2023-02-20 ENCOUNTER — Other Ambulatory Visit (HOSPITAL_COMMUNITY): Payer: Self-pay

## 2023-02-20 ENCOUNTER — Other Ambulatory Visit: Payer: Self-pay | Admitting: Pharmacist

## 2023-02-20 ENCOUNTER — Telehealth: Payer: Self-pay | Admitting: Internal Medicine

## 2023-02-20 ENCOUNTER — Other Ambulatory Visit: Payer: Self-pay

## 2023-02-20 ENCOUNTER — Ambulatory Visit: Payer: 59 | Attending: Internal Medicine | Admitting: Internal Medicine

## 2023-02-20 DIAGNOSIS — E785 Hyperlipidemia, unspecified: Secondary | ICD-10-CM

## 2023-02-20 DIAGNOSIS — I10 Essential (primary) hypertension: Secondary | ICD-10-CM

## 2023-02-20 DIAGNOSIS — E1159 Type 2 diabetes mellitus with other circulatory complications: Secondary | ICD-10-CM

## 2023-02-20 DIAGNOSIS — I700 Atherosclerosis of aorta: Secondary | ICD-10-CM | POA: Diagnosis not present

## 2023-02-20 DIAGNOSIS — N1832 Chronic kidney disease, stage 3b: Secondary | ICD-10-CM | POA: Diagnosis not present

## 2023-02-20 DIAGNOSIS — I152 Hypertension secondary to endocrine disorders: Secondary | ICD-10-CM

## 2023-02-20 DIAGNOSIS — C966 Unifocal Langerhans-cell histiocytosis: Secondary | ICD-10-CM | POA: Diagnosis not present

## 2023-02-20 DIAGNOSIS — N184 Chronic kidney disease, stage 4 (severe): Secondary | ICD-10-CM | POA: Insufficient documentation

## 2023-02-20 DIAGNOSIS — J449 Chronic obstructive pulmonary disease, unspecified: Secondary | ICD-10-CM | POA: Diagnosis not present

## 2023-02-20 DIAGNOSIS — E1169 Type 2 diabetes mellitus with other specified complication: Secondary | ICD-10-CM | POA: Diagnosis not present

## 2023-02-20 LAB — GLUCOSE, POCT (MANUAL RESULT ENTRY): POC Glucose: 131 mg/dl — AB (ref 70–99)

## 2023-02-20 LAB — POCT GLYCOSYLATED HEMOGLOBIN (HGB A1C): HbA1c, POC (controlled diabetic range): 6.7 % (ref 0.0–7.0)

## 2023-02-20 MED ORDER — SEMAGLUTIDE(0.25 OR 0.5MG/DOS) 2 MG/3ML ~~LOC~~ SOPN
0.2500 mg | PEN_INJECTOR | SUBCUTANEOUS | 1 refills | Status: DC
Start: 2023-02-20 — End: 2023-04-04
  Filled 2023-02-20: qty 3, 28d supply, fill #0
  Filled 2023-03-18: qty 3, 28d supply, fill #1

## 2023-02-20 MED ORDER — FREESTYLE LIBRE 2 SENSOR MISC
6 refills | Status: DC
  Filled 2023-02-20: qty 2, fill #0
  Filled 2023-02-20 – 2023-02-28 (×5): qty 2, 28d supply, fill #0

## 2023-02-20 MED ORDER — FREESTYLE LIBRE 2 READER DEVI
0 refills | Status: AC
Start: 2023-02-20 — End: ?
  Filled 2023-02-20: qty 1, fill #0
  Filled 2023-02-20: qty 1, 30d supply, fill #0
  Filled 2023-02-24: qty 1, 14d supply, fill #0
  Filled 2023-02-28 (×3): qty 1, 30d supply, fill #0

## 2023-02-20 NOTE — Patient Instructions (Signed)
STOP Phentermine once you star Ozempic 0.25 mg once a week.  Diabetes Mellitus and Standards of Medical Care Living with and managing diabetes (diabetes mellitus) can be complicated. Your diabetes treatment may be managed by a team of health care providers, including: A physician who specializes in diabetes (endocrinologist). You might also have visits with a nurse practitioner or physician assistant. Nurses. A registered dietitian. A certified diabetes care and education specialist. An exercise specialist. A pharmacist. An eye doctor. A foot specialist (podiatrist). A dental care provider. A primary care provider. A mental health care provider. How to manage your diabetes You can do many things to successfully manage your diabetes. Your health care providers will follow guidelines to help you get the best quality of care. Here are general guidelines for your diabetes management plan. Your health care providers may give you more specific instructions. Physical exams When you are diagnosed with diabetes, and each year after that, your health care provider will ask about your medical and family history. You will have a physical exam, which may include: Measuring your height, weight, and body mass index (BMI). Checking your blood pressure. This will be done at every routine medical visit. Your target blood pressure may vary depending on your medical conditions, your age, and other factors. A thyroid exam. A skin exam. Screening for nerve damage (peripheral neuropathy). This may include checking the pulse in your legs and feet and the level of sensation in your hands and feet. A foot exam to inspect the structure and skin of your feet, including checking for cuts, bruises, redness, blisters, sores, or other problems. Screening for blood vessel (vascular) problems. This may include checking the pulse in your legs and feet and checking your temperature. Blood tests Depending on your treatment  plan and your personal needs, you may have the following tests: Hemoglobin A1C (HbA1C). This test provides information about blood sugar (glucose) control over the previous 2-3 months. It is used to adjust your treatment plan, if needed. This test will be done: At least 2 times a year, if you are meeting your treatment goals. 4 times a year, if you are not meeting your treatment goals or if your goals have changed. Lipid testing, including total cholesterol, LDL and HDL cholesterol, and triglyceride levels. The goal for LDL is less than 100 mg/dL (5.5 mmol/L). If you are at high risk for complications, the goal is less than 70 mg/dL (3.9 mmol/L). The goal for HDL is 40 mg/dL (2.2 mmol/L) or higher for men, and 50 mg/dL (2.8 mmol/L) or higher for women. An HDL cholesterol of 60 mg/dL (3.3 mmol/L) or higher gives some protection against heart disease. The goal for triglycerides is less than 150 mg/dL (8.3 mmol/L). Liver function tests. Kidney function tests. Thyroid function tests.  Dental and eye exams  Visit your dentist two times a year. If you have type 1 diabetes, your health care provider may recommend an eye exam within 5 years after you are diagnosed, and then once a year after your first exam. For children with type 1 diabetes, the health care provider may recommend an eye exam when your child is age 68 or older and has had diabetes for 3-5 years. After the first exam, your child should get an eye exam once a year. If you have type 2 diabetes, your health care provider may recommend an eye exam as soon as you are diagnosed, and then every 1-2 years after your first exam. Immunizations A yearly flu (influenza) vaccine  is recommended annually for everyone 6 months or older. This is especially important if you have diabetes. The pneumonia (pneumococcal) vaccine is recommended for everyone 2 years or older who has diabetes. If you are age 65 or older, you may get the pneumonia vaccine as a  series of two separate shots. The hepatitis B vaccine is recommended for adults shortly after being diagnosed with diabetes. Adults and children with diabetes should receive all other vaccines according to age-specific recommendations from the Centers for Disease Control and Prevention (CDC). Mental and emotional health Screening for symptoms of eating disorders, anxiety, and depression is recommended at the time of diagnosis and after as needed. If your screening shows that you have symptoms, you may need more evaluation. You may work with a mental health care provider. Follow these instructions at home: Treatment plan You will monitor your blood glucose levels and may give yourself insulin. Your treatment plan will be reviewed at every medical visit. You and your health care provider will discuss: How you are taking your medicines, including insulin. Any side effects you have. Your blood glucose level target goals. How often you monitor your blood glucose level. Lifestyle habits, such as activity level and tobacco, alcohol, and substance use. Education Your health care provider will assess how well you are monitoring your blood glucose levels and whether you are taking your insulin and medicines correctly. He or she may refer you to: A certified diabetes care and education specialist to manage your diabetes throughout your life, starting at diagnosis. A registered dietitian who can create and review your personal nutrition plan. An exercise specialist who can discuss your activity level and exercise plan. General instructions Take over-the-counter and prescription medicines only as told by your health care provider. Keep all follow-up visits. This is important. Where to find support There are many diabetes support networks, including: American Diabetes Association (ADA): diabetes.org Defeat Diabetes Foundation: defeatdiabetes.org Where to find more information American Diabetes  Association (ADA): www.diabetes.org Association of Diabetes Care & Education Specialists (ADCES): diabeteseducator.org International Diabetes Federation (IDF): http://hill.biz/ Summary Managing diabetes (diabetes mellitus) can be complicated. Your diabetes treatment may be managed by a team of health care providers. Your health care providers follow guidelines to help you get the best quality care. You should have physical exams, blood tests, blood pressure monitoring, immunizations, and screening tests regularly. Stay updated on how to manage your diabetes. Your health care providers may also give you more specific instructions based on your individual health. This information is not intended to replace advice given to you by your health care provider. Make sure you discuss any questions you have with your health care provider. Document Revised: 04/23/2020 Document Reviewed: 04/23/2020 Elsevier Patient Education  2023 Elsevier [employer].   Semaglutide Injection What is this medication? SEMAGLUTIDE (SEM a GLOO tide) treats type 2 diabetes. It works by increasing insulin levels in your body, which decreases your blood sugar (glucose). It also reduces the amount of sugar released into the blood and slows down your digestion. It can also be used to lower the risk of heart attack and stroke in people with type 2 diabetes. Changes to diet and exercise are often combined with this medication. This medicine may be used for other purposes; ask your health care provider or pharmacist if you have questions. COMMON BRAND NAME(S): OZEMPIC What should I tell my care team before I take this medication? They need to know if you have any of these conditions: Endocrine tumors (MEN 2) or if  someone in your family had these tumors Eye disease, vision problems History of pancreatitis Kidney disease Stomach problems Thyroid cancer or if someone in your family had thyroid cancer An unusual or allergic reaction to semaglutide,  other medications, foods, dyes, or preservatives Pregnant or trying to get pregnant Breast-feeding How should I use this medication? This medication is for injection under the skin of your upper leg (thigh), stomach area, or upper arm. It is given once every week (every 7 days). You will be taught how to prepare and give this medication. Use exactly as directed. Take your medication at regular intervals. Do not take it more often than directed. If you use this medication with insulin, you should inject this medication and the insulin separately. Do not mix them together. Do not give the injections right next to each other. Change (rotate) injection sites with each injection. It is important that you put your used needles and syringes in a special sharps container. Do not put them in a trash can. If you do not have a sharps container, call your pharmacist or care team to get one. A special MedGuide will be given to you by the pharmacist with each prescription and refill. Be sure to read this information carefully each time. This medication comes with INSTRUCTIONS FOR USE. Ask your pharmacist for directions on how to use this medication. Read the information carefully. Talk to your pharmacist or care team if you have questions. Talk to your care team about the use of this medication in children. Special care may be needed. Overdosage: If you think you have taken too much of this medicine contact a poison control center or emergency room at once. NOTE: This medicine is only for you. Do not share this medicine with others. What if I miss a dose? If you miss a dose, take it as soon as you can within 5 days after the missed dose. Then take your next dose at your regular weekly time. If it has been longer than 5 days after the missed dose, do not take the missed dose. Take the next dose at your regular time. Do not take double or extra doses. If you have questions about a missed dose, contact your care team  for advice. What may interact with this medication? Other medications for diabetes Many medications may cause changes in blood sugar, these include: Alcohol containing beverages Antiviral medications for HIV or AIDS Aspirin and aspirin-like medications Certain medications for blood pressure, heart disease, irregular heart beat Chromium Diuretics Female hormones, such as estrogens or progestins, birth control pills Fenofibrate Gemfibrozil Isoniazid Lanreotide Female hormones or anabolic steroids MAOIs like Carbex, Eldepryl, Marplan, Nardil, and Parnate Medications for weight loss Medications for allergies, asthma, cold, or cough Medications for depression, anxiety, or psychotic disturbances Niacin Nicotine NSAIDs, medications for pain and inflammation, like ibuprofen or naproxen Octreotide Pasireotide Pentamidine Phenytoin Probenecid Quinolone antibiotics such as ciprofloxacin, levofloxacin, ofloxacin Some herbal dietary supplements Steroid medications such as prednisone or cortisone Sulfamethoxazole; trimethoprim Thyroid hormones Some medications can hide the warning symptoms of low blood sugar (hypoglycemia). You may need to monitor your blood sugar more closely if you are taking one of these medications. These include: Beta-blockers, often used for high blood pressure or heart problems (examples include atenolol, metoprolol, propranolol) Clonidine Guanethidine Reserpine This list may not describe all possible interactions. Give your health care provider a list of all the medicines, herbs, non-prescription drugs, or dietary supplements you use. Also tell them if you smoke, drink alcohol,  or use illegal drugs. Some items may interact with your medicine. What should I watch for while using this medication? Visit your care team for regular checks on your progress. Drink plenty of fluids while taking this medication. Check with your care team if you get an attack of severe  diarrhea, nausea, and vomiting. The loss of too much body fluid can make it dangerous for you to take this medication. A test called the HbA1C (A1C) will be monitored. This is a simple blood test. It measures your blood sugar control over the last 2 to 3 months. You will receive this test every 3 to 6 months. Learn how to check your blood sugar. Learn the symptoms of low and high blood sugar and how to manage them. Always carry a quick-source of sugar with you in case you have symptoms of low blood sugar. Examples include hard sugar candy or glucose tablets. Make sure others know that you can choke if you eat or drink when you develop serious symptoms of low blood sugar, such as seizures or unconsciousness. They must get medical help at once. Tell your care team if you have high blood sugar. You might need to change the dose of your medication. If you are sick or exercising more than usual, you might need to change the dose of your medication. Do not skip meals. Ask your care team if you should avoid alcohol. Many nonprescription cough and cold products contain sugar or alcohol. These can affect blood sugar. Pens should never be shared. Even if the needle is changed, sharing may result in passing of viruses like hepatitis or HIV. Wear a medical ID bracelet or chain, and carry a card that describes your disease and details of your medication and dosage times. Do not become pregnant while taking this medication. Women should inform their care team if they wish to become pregnant or think they might be pregnant. There is a potential for serious side effects to an unborn child. Talk to your care team for more information. What side effects may I notice from receiving this medication? Side effects that you should report to your care team as soon as possible: Allergic reactions--skin rash, itching, hives, swelling of the face, lips, tongue, or throat Change in vision Dehydration--increased thirst, dry mouth,  feeling faint or lightheaded, headache, dark yellow or brown urine Gallbladder problems--severe stomach pain, nausea, vomiting, fever Heart palpitations--rapid, pounding, or irregular heartbeat Kidney injury--decrease in the amount of urine, swelling of the ankles, hands, or feet Pancreatitis--severe stomach pain that spreads to your back or gets worse after eating or when touched, fever, nausea, vomiting Thyroid cancer--new mass or lump in the neck, pain or trouble swallowing, trouble breathing, hoarseness Side effects that usually do not require medical attention (report to your care team if they continue or are bothersome): Diarrhea Loss of appetite Nausea Stomach pain Vomiting This list may not describe all possible side effects. Call your doctor for medical advice about side effects. You may report side effects to FDA at 1-800-FDA-1088. Where should I keep my medication? Keep out of the reach of children. Store unopened pens in a refrigerator between 2 and 8 degrees C (36 and 46 degrees F). Do not freeze. Protect from light and heat. After you first use the pen, it can be stored for 56 days at room temperature between 15 and 30 degrees C (59 and 86 degrees F) or in a refrigerator. Throw away your used pen after 56 days or after the expiration  date, whichever comes first. Do not store your pen with the needle attached. If the needle is left on, medication may leak from the pen. NOTE: This sheet is a summary. It may not cover all possible information. If you have questions about this medicine, talk to your doctor, pharmacist, or health care provider.  2023 Elsevier/Gold Standard (2021-01-21 00:00:00)

## 2023-02-20 NOTE — Telephone Encounter (Signed)
Pt called in states needs PA for Continuous Glucose Sensor (FREESTYLE LIBRE 2 SENSOR) MISC  and Continuous Glucose Sensor (FREESTYLE LIBRE 2 receiver MISC, also mentioned test strips that I dont show on med list

## 2023-02-20 NOTE — Telephone Encounter (Signed)
Medication Refill - Medication: furosemide (LASIX) 20 MG tablet ,   Has the patient contacted their pharmacy? Yes.    Preferred Pharmacy (with phone number or street name):  [name] Phone: [phone]  Fax: [phone]      Has the patient been seen for an appointment in the last year OR does the patient have an upcoming appointment? Yes.    The patient also states the provider was going to call in a continuous glucose monitor testing meter and testing strips that she mentioned to the patient during the appointment to go along with the refill of her Lasix. Patient states the pharmacy told her it could be ready by 2:30 today

## 2023-02-20 NOTE — Progress Notes (Unsigned)
Patient ID: Krystal Allen, female    DOB: 1956-03-18  MRN: [id number]  CC: Hypertension (HTN f/u.)   Subjective: Krystal Allen is a 67 y.o. female who presents for chronic ds management Her concerns today include:  Pt with hx of HTN, HL, aortic atherosclerosis, COPD, ILD (pulmonary Langerhans' cell cell histiocytosis), nocturnal hypoxia followed by pulmonary, GERD, CKD 2-3, preDM/obesity, former tob dep, DJD c-spine, spinal stenosis, stress incont, adenomatous colon polyp.     Seen in ER 02/06/2023 for swelling in lower lip again. Pt questions whether it is due to Potassium supplement pills Was on Tesalon Perles for cough and new Oxybutinin for stress incontince.  She d/c taking.   Has another appt with allergist Dr. Allena Allen.    Pre-DM/Obesity: Lab Results  Component Value Date   HGBA1C 6.4 10/17/2022  Taking Phentermine consistently.  Does not feel it has dec appetite. Wgh done 8 lbs.   Walk outside in back yard.  No plans to do more.  Not eating a lot of sweets.  Occasional craving junk  Mouth still gets dry  Aortic Atherosclerosis:  Repatha d Lipitor 80 mg  CKD 3;  last GF 38 with range 38-42.  Last Creat 1.5 Followed by Dr. Melanee Spry  Lung ds:  uses O2 every now and then at nights    Patient Active Problem List   Diagnosis Date Noted   Morbid obesity 03/09/2022   Seasonal allergies 02/07/2022   Mastitis, right, acute 12/23/2021   Influenza vaccine needed 09/14/2020   Tobacco dependence 05/12/2020   Aortic atherosclerosis 05/12/2020   Adenomatous polyp of colon 05/12/2020   Langerhan's cell histiocytosis 01/26/2018   Cigarette smoker 01/26/2018   Osteoarthritis of cervical spine 12/22/2017   Prediabetes 12/22/2017   Pain of left calf 04/06/2017   Centrilobular emphysema 02/23/2016   Obesity (BMI 30-39.9) 01/15/2016   Bilateral shoulder pain 01/15/2016   Esophageal reflux 01/15/2016   Tobacco abuse 05/13/2015   Lung nodule 08/14/2014   Impaired fasting glucose  08/14/2014   Hyperlipidemia 08/14/2014   Personal history of colonic adenomas 06/25/2013   HTN (hypertension) 05/20/2013     Current Outpatient Medications on File Prior to Visit  Medication Sig Dispense Refill   acetaminophen (TYLENOL 8 HOUR) 650 MG CR tablet Take 1 tablet (650 mg total) by mouth 2 (two) times daily as needed for pain. 60 tablet 5   albuterol (PROAIR HFA) 108 (90 Base) MCG/ACT inhaler Inhale 2 puffs into the lungs every 4 (four) hours as needed for wheezing or shortness of breath. 6.7 g 0   albuterol (PROVENTIL) (2.5 MG/3ML) 0.083% nebulizer solution TAKE 3 MLS BY NEBULIZATION EVERY 6 (SIX) HOURS AS NEEDED FOR WHEEZING OR SHORTNESS OF BREATH. 90 mL 1   aspirin EC 81 MG tablet Take 1 tablet (81 mg total) by mouth daily. 100 tablet 1   atorvastatin (LIPITOR) 80 MG tablet Take 1 tablet (80 mg total) by mouth daily. 90 tablet 3   benzonatate (TESSALON PERLES) 100 MG capsule Take 1 capsule (100 mg total) by mouth every 6 (six) hours as needed for cough. 30 capsule 0   cetirizine (ZYRTEC) 10 MG tablet Take 1 tablet (10 mg total) by mouth daily. 30 tablet 11   diphenhydrAMINE (BENADRYL) 25 mg capsule Take 25-50 mg by mouth every 6 (six) hours as needed for itching or allergies.     doxycycline (VIBRAMYCIN) 100 MG capsule Take 1 capsule (100 mg total) by mouth 2 (two) times daily. 20 capsule 0  Elastic Bandages & Supports (MEDICAL COMPRESSION STOCKINGS) MISC R60.0 1 each 0   EPINEPHrine 0.3 mg/0.3 mL IJ SOAJ injection Inject 0.3 mg into the muscle as needed for anaphylaxis. 2 each 2   Evolocumab (REPATHA SURECLICK) 140 MG/ML SOAJ Inject 140 mg into the skin every 14 (fourteen) days. 6 mL 3   ezetimibe (ZETIA) 10 MG tablet Take 1 tablet (10 mg total) by mouth daily. 90 tablet 3   famotidine (PEPCID) 20 MG tablet Take 1 tablet (20 mg total) by mouth 2 (two) times daily as needed (hives). 60 tablet 5   fexofenadine (ALLEGRA ALLERGY) 180 MG tablet Take 1 tablet (180 mg total) by mouth 2  (two) times daily as needed (hives). 60 tablet 5   fluticasone (FLONASE) 50 MCG/ACT nasal spray Place 2 sprays into both nostrils daily. 16 g 5   fluticasone furoate-vilanterol (BREO ELLIPTA) 200-25 MCG/ACT AEPB Inhale 1 puff into the lungs once daily. 60 each 5   furosemide (LASIX) 20 MG tablet Take 1 tablet (20 mg total) by mouth daily. 90 tablet 3   hydrALAZINE (APRESOLINE) 10 MG tablet Take 1 tablet (10 mg total) by mouth 2 (two) times daily. 60 tablet 0   hydrochlorothiazide (HYDRODIURIL) 25 MG tablet Take 1 tablet (25 mg total) by mouth daily. 30 tablet 0   hydrOXYzine (ATARAX) 25 MG tablet Take 1 tablet (25 mg total) by mouth every 8 (eight) hours as needed for up to 15 doses for itching. 15 tablet 0   Olopatadine HCl 0.2 % SOLN Place 1 drop into both eyes daily as needed (allergies). 2.5 mL 5   omeprazole (PRILOSEC) 20 MG capsule Take 1 capsule (20 mg total) by mouth daily. 90 capsule 3   oxybutynin (DITROPAN XL) 5 MG 24 hr tablet Take 1 tablet (5 mg total) by mouth at bedtime. 30 tablet 0   phentermine 37.5 MG capsule Take 1 capsule (37.5 mg total) by mouth every morning. 30 capsule 1   potassium chloride SA (KLOR-CON M) 20 MEQ tablet Take 4 tablets by mouth today and then 1 tablet by mouth twice daily thereafter. (Patient taking differently: 20 mEq daily. Take 4 tablets by mouth today and then 1 tablet by mouth twice daily thereafter.) 60 tablet 3   Respiratory Therapy Supplies (FLUTTER) DEVI Use as directed 1 each 0   sodium chloride (OCEAN) 0.65 % nasal spray Place 2 sprays into both nostrils every 4 (four) hours. (Patient taking differently: Place 2 sprays into the nose every 4 (four) hours as needed for congestion.) 44 mL 0   Tiotropium Bromide Monohydrate (SPIRIVA RESPIMAT) 2.5 MCG/ACT AERS Inhale 2 puffs into the lungs daily. 4 g 5   No current facility-administered medications on file prior to visit.    Allergies  Allergen Reactions   Sulfa Antibiotics Anaphylaxis and Hives    Chantix [Varenicline Tartrate]     Bad dreams   Fish Allergy Other (See Comments)    Severe stomach cramps    Hygroton [Chlorthalidone]    Penicillins Other (See Comments)    Has patient had a PCN reaction causing immediate rash, facial/tongue/throat swelling, SOB or lightheadedness with hypotension: No Has patient had a PCN reaction causing severe rash involving mucus membranes or skin necrosis: No Has patient had a PCN reaction that required hospitalization No Has patient had a PCN reaction occurring within the last 10 years: No If all of the above answers are "NO", then may proceed with Cephalosporin use.  Welts    Latex Rash and  Other (See Comments)    Dry skin    Social History   Socioeconomic History   Marital status: Married    Spouse name: Not on file   Number of children: Not on file   Years of education: 12+   Highest education level: Not on file  Occupational History   Occupation: PCA    Employer: [employer]  Tobacco Use   Smoking status: Former    Packs/day: 0.50    Years: 40.00    Additional pack years: 0.00    Total pack years: 20.00    Types: Cigarettes    Quit date: 07/2021    Years since quitting: 1.5    Passive exposure: Never   Smokeless tobacco: Never   Tobacco comments:    Quit [id number]  Vaping Use   Vaping Use: Former  Substance and Sexual Activity   Alcohol use: Yes    Comment: occasional   Drug use: No   Sexual activity: Not Currently  Other Topics Concern   Not on file  Social History Narrative   Regular exercise-no Caffeine Use-   Social Determinants of Health   Financial Resource Strain: Low Risk  (08/19/2022)   Overall Financial Resource Strain (CARDIA)    Difficulty of Paying Living Expenses: Not hard at all  Food Insecurity: No Food Insecurity (08/19/2022)   Hunger Vital Sign    Worried About Running Out of Food in the Last Year: Never true    Ran Out of Food in the Last Year: Never true  Transportation Needs: No Transportation  Needs (08/19/2022)   PRAPARE - Administrator, Civil Service (Medical): No    Lack of Transportation (Non-Medical): No  Physical Activity: Inactive (08/19/2022)   Exercise Vital Sign    Days of Exercise per Week: 1 day    Minutes of Exercise per Session: 0 min  Stress: No Stress Concern Present (08/19/2022)   [employer] of Occupational Health - Occupational Stress Questionnaire    Feeling of Stress : Not at all  Social Connections: Socially Isolated (08/19/2022)   Social Connection and Isolation Panel [NHANES]    Frequency of Communication with Friends and Family: More than three times a week    Frequency of Social Gatherings with Friends and Family: Three times a week    Attends Religious Services: Never    Active Member of Clubs or Organizations: No    Attends Banker Meetings: Never    Marital Status: Separated  Intimate Partner Violence: Not At Risk (08/19/2022)   Humiliation, Afraid, Rape, and Kick questionnaire    Fear of Current or Ex-Partner: No    Emotionally Abused: No    Physically Abused: No    Sexually Abused: No    Family History  Problem Relation Age of Onset   Cancer Father        prostate   Hypertension Mother    Diabetes Mother    Hypertension Sister    Heart disease Maternal Uncle    Stroke Maternal Grandmother    Hypertension Maternal Grandmother    Hypertension Sister    Colon cancer Neg Hx    Stomach cancer Neg Hx    Rectal cancer Neg Hx    Colon polyps Neg Hx     Past Surgical History:  Procedure Laterality Date   ABDOMINAL HYSTERECTOMY  39yrs ago   due to heavy bleeding and fibroids    COLONOSCOPY     HEMORRHOID BANDING  2014   HEMORRHOID SURGERY  9yrs agao   RIGHT HEART CATH N/A 10/12/2022   Procedure: RIGHT HEART CATH;  Surgeon: Orbie Pyo, MD;  Location: [facility]ASIVE CV LAB;  Service: Cardiovascular;  Laterality: N/A;   UMBILICAL HERNIA REPAIR  06/2015    ROS: Review of Systems Negative except as  stated above  PHYSICAL EXAM: BP 115/72 (BP Location: Left Arm, Patient Position: Sitting, Cuff Size: Large)   Pulse 82   Temp 98.3 F (36.8 C) (Oral)   Ht 5\' 6"  (1.676 m)   Wt 259 lb (117.5 kg)   SpO2 97%   BMI 41.80 kg/m   Wt Readings from Last 3 Encounters:  02/20/23 259 lb (117.5 kg)  01/25/23 262 lb 9.6 oz (119.1 kg)  12/02/22 266 lb 3.2 oz (120.7 kg)    Physical Exam  {female adult master:310786} {female adult master:310785}     Latest Ref Rng & Units 02/10/2023   10:13 AM 02/06/2023   10:22 AM 01/25/2023   10:48 AM  CMP  Glucose 70 - 99 mg/dL 161  096  045   BUN 8 - 27 mg/dL 17  16  17    Creatinine 0.57 - 1.00 mg/dL 4.09  8.11  9.14   Sodium 134 - 144 mmol/L 141  139  143   Potassium 3.5 - 5.2 mmol/L 3.5  3.2  3.1   Chloride 96 - 106 mmol/L 100  105  99   CO2 20 - 29 mmol/L 25  26  24    Calcium 8.7 - 10.3 mg/dL 9.1  8.5  9.0   Total Protein 6.5 - 8.1 g/dL  6.9    Total Bilirubin 0.3 - 1.2 mg/dL  0.5    Alkaline Phos 38 - 126 U/L  137    AST 15 - 41 U/L  21    ALT 0 - 44 U/L  24     Lipid Panel     Component Value Date/Time   CHOL 104 12/02/2022 0909   TRIG 102 12/02/2022 0909   HDL 52 12/02/2022 0909   CHOLHDL 2.0 12/02/2022 0909   CHOLHDL 3.7 08/17/2021 0340   VLDL 12 08/17/2021 0340   LDLCALC 33 12/02/2022 0909   LDLDIRECT 190.1 04/16/2013 1531    CBC    Component Value Date/Time   WBC 7.2 02/06/2023 1022   RBC 4.53 02/06/2023 1022   HGB 13.2 02/06/2023 1022   HGB 14.4 10/03/2022 1120   HCT 39.5 02/06/2023 1022   HCT 41.8 10/03/2022 1120   PLT 280 02/06/2023 1022   PLT 260 10/03/2022 1120   MCV 87.2 02/06/2023 1022   MCV 85 10/03/2022 1120   MCH 29.1 02/06/2023 1022   MCHC 33.4 02/06/2023 1022   RDW 18.4 (H) 02/06/2023 1022   RDW 17.9 (H) 10/03/2022 1120   LYMPHSABS 2.2 02/06/2023 1022   LYMPHSABS 2.2 02/10/2020 1000   MONOABS 0.5 02/06/2023 1022   EOSABS 0.1 02/06/2023 1022   EOSABS 0.2 02/10/2020 1000   BASOSABS 0.0 02/06/2023 1022    BASOSABS 0.1 02/10/2020 1000    ASSESSMENT AND PLAN:  There are no diagnoses linked to this encounter.   Patient was given the opportunity to ask questions.  Patient verbalized understanding of the plan and was able to repeat key elements of the plan.   This documentation was completed using Paediatric nurse.  Any transcriptional errors are unintentional.  No orders of the defined types were placed in this encounter.    Requested Prescriptions    No prescriptions requested  or ordered in this encounter    No follow-ups on file.  Karle Plumber, MD, FACP

## 2023-02-21 ENCOUNTER — Encounter: Payer: Self-pay | Admitting: Internal Medicine

## 2023-02-21 ENCOUNTER — Other Ambulatory Visit: Payer: Self-pay

## 2023-02-22 ENCOUNTER — Ambulatory Visit: Payer: 59 | Attending: Physician Assistant

## 2023-02-22 ENCOUNTER — Other Ambulatory Visit: Payer: Self-pay

## 2023-02-22 DIAGNOSIS — Z79899 Other long term (current) drug therapy: Secondary | ICD-10-CM

## 2023-02-22 DIAGNOSIS — R0602 Shortness of breath: Secondary | ICD-10-CM | POA: Diagnosis not present

## 2023-02-23 ENCOUNTER — Telehealth: Payer: Self-pay | Admitting: Family

## 2023-02-23 ENCOUNTER — Telehealth: Payer: Self-pay

## 2023-02-23 ENCOUNTER — Other Ambulatory Visit: Payer: Self-pay

## 2023-02-23 DIAGNOSIS — Z79899 Other long term (current) drug therapy: Secondary | ICD-10-CM

## 2023-02-23 DIAGNOSIS — E876 Hypokalemia: Secondary | ICD-10-CM

## 2023-02-23 DIAGNOSIS — I10 Essential (primary) hypertension: Secondary | ICD-10-CM

## 2023-02-23 DIAGNOSIS — N183 Chronic kidney disease, stage 3 unspecified: Secondary | ICD-10-CM

## 2023-02-23 LAB — BASIC METABOLIC PANEL
BUN/Creatinine Ratio: 14 (ref 12–28)
BUN: 21 mg/dL (ref 8–27)
CO2: 26 mmol/L (ref 20–29)
Calcium: 9.1 mg/dL (ref 8.7–10.3)
Chloride: 98 mmol/L (ref 96–106)
Creatinine, Ser: 1.5 mg/dL — ABNORMAL HIGH (ref 0.57–1.00)
Glucose: 143 mg/dL — ABNORMAL HIGH (ref 70–99)
Potassium: 3.2 mmol/L — ABNORMAL LOW (ref 3.5–5.2)
Sodium: 141 mmol/L (ref 134–144)
eGFR: 38 mL/min/{1.73_m2} — ABNORMAL LOW (ref >59)

## 2023-02-23 MED ORDER — FUROSEMIDE 20 MG PO TABS: 20.0000 mg | ORAL_TABLET | Freq: Every day | ORAL | Status: DC | PRN

## 2023-02-23 MED ORDER — POTASSIUM CHLORIDE CRYS ER 20 MEQ PO TBCR: EXTENDED_RELEASE_TABLET | ORAL | Status: DC

## 2023-02-23 NOTE — Telephone Encounter (Signed)
Copied from CRM [id number]. Topic: General - Other >> Feb 23, 2023  8:26 AM Carrielelia G wrote: Reason for CRM:  please call pt and let her know at what stage you are at in getting her.Continuous Glucose Receiver (FREESTYLE LIBRE 2 READER) and  Continuous Glucose Sensor (FREESTYLE LIBRE 2 SENSOR) [place] [[id number] authorized   Please advise

## 2023-02-23 NOTE — Telephone Encounter (Signed)
Spoke with patient and discussed lab results.  Per Gillian Shields, NP: Kidney function not improving.  If taking Lasix daily, reduced to only as needed for weight gain of 2 to 3 pounds overnight or increasing edema.   Potassium is low.  Ensure taking potassium tablet twice daily.   Increase to potassium to 2 tabs AM and 1 tab PM for 3 days. Then return to 1 tab BID.   BMP in 2 weeks for monitoring.    Spent several minutes discussing recommendations with patient and why they were being made. Patient verbalized understanding and repeated back medication changes.  Patient states she had reduced her potassium to once daily, thinking she was having an allergic reaction to it but decided it was not the potassium.  BMP ordered and scheduled for 03/10/23.

## 2023-02-23 NOTE — Telephone Encounter (Signed)
Pt called back checking on status of pa for freestyle libre, Continuous Glucose Receiver (FREESTYLE LIBRE 2 READER) and Continuous Glucose Sensor (FREESTYLE LIBRE 2 SENSOR) [place] [[id number] authorized

## 2023-02-24 ENCOUNTER — Emergency Department (HOSPITAL_COMMUNITY)
Admission: EM | Admit: 2023-02-24 | Discharge: 2023-02-24 | Disposition: A | Discharge status: Home / Self Care | Payer: 59 | Source: Home / Self Care | Attending: Emergency Medicine | Admitting: Emergency Medicine

## 2023-02-24 ENCOUNTER — Other Ambulatory Visit: Payer: Self-pay

## 2023-02-24 ENCOUNTER — Emergency Department (HOSPITAL_COMMUNITY): Payer: 59

## 2023-02-24 ENCOUNTER — Ambulatory Visit: Payer: Self-pay

## 2023-02-24 ENCOUNTER — Other Ambulatory Visit: Payer: Self-pay | Admitting: Pharmacist

## 2023-02-24 ENCOUNTER — Encounter (HOSPITAL_COMMUNITY): Payer: Self-pay

## 2023-02-24 ENCOUNTER — Other Ambulatory Visit (HOSPITAL_COMMUNITY): Payer: Self-pay

## 2023-02-24 ENCOUNTER — Telehealth: Payer: Self-pay | Admitting: Internal Medicine

## 2023-02-24 DIAGNOSIS — Z91040 Latex allergy status: Secondary | ICD-10-CM | POA: Insufficient documentation

## 2023-02-24 DIAGNOSIS — I152 Hypertension secondary to endocrine disorders: Secondary | ICD-10-CM | POA: Diagnosis not present

## 2023-02-24 DIAGNOSIS — I129 Hypertensive chronic kidney disease with stage 1 through stage 4 chronic kidney disease, or unspecified chronic kidney disease: Secondary | ICD-10-CM | POA: Diagnosis not present

## 2023-02-24 DIAGNOSIS — N1832 Chronic kidney disease, stage 3b: Secondary | ICD-10-CM | POA: Diagnosis not present

## 2023-02-24 DIAGNOSIS — I10 Essential (primary) hypertension: Secondary | ICD-10-CM | POA: Insufficient documentation

## 2023-02-24 DIAGNOSIS — I739 Peripheral vascular disease, unspecified: Secondary | ICD-10-CM | POA: Diagnosis not present

## 2023-02-24 DIAGNOSIS — Z7984 Long term (current) use of oral hypoglycemic drugs: Secondary | ICD-10-CM | POA: Insufficient documentation

## 2023-02-24 DIAGNOSIS — E785 Hyperlipidemia, unspecified: Secondary | ICD-10-CM | POA: Diagnosis not present

## 2023-02-24 DIAGNOSIS — E119 Type 2 diabetes mellitus without complications: Secondary | ICD-10-CM | POA: Insufficient documentation

## 2023-02-24 DIAGNOSIS — E1122 Type 2 diabetes mellitus with diabetic chronic kidney disease: Secondary | ICD-10-CM | POA: Diagnosis not present

## 2023-02-24 DIAGNOSIS — M1990 Unspecified osteoarthritis, unspecified site: Secondary | ICD-10-CM | POA: Diagnosis not present

## 2023-02-24 DIAGNOSIS — Z7982 Long term (current) use of aspirin: Secondary | ICD-10-CM | POA: Insufficient documentation

## 2023-02-24 DIAGNOSIS — L03213 Periorbital cellulitis: Secondary | ICD-10-CM | POA: Diagnosis not present

## 2023-02-24 DIAGNOSIS — E872 Acidosis, unspecified: Secondary | ICD-10-CM | POA: Diagnosis not present

## 2023-02-24 DIAGNOSIS — I7090 Unspecified atherosclerosis: Secondary | ICD-10-CM | POA: Insufficient documentation

## 2023-02-24 DIAGNOSIS — Z882 Allergy status to sulfonamides status: Secondary | ICD-10-CM | POA: Diagnosis not present

## 2023-02-24 DIAGNOSIS — E669 Obesity, unspecified: Secondary | ICD-10-CM | POA: Diagnosis not present

## 2023-02-24 DIAGNOSIS — H11423 Conjunctival edema, bilateral: Secondary | ICD-10-CM | POA: Diagnosis not present

## 2023-02-24 DIAGNOSIS — J849 Interstitial pulmonary disease, unspecified: Secondary | ICD-10-CM | POA: Diagnosis not present

## 2023-02-24 DIAGNOSIS — Z79899 Other long term (current) drug therapy: Secondary | ICD-10-CM | POA: Insufficient documentation

## 2023-02-24 DIAGNOSIS — F32A Depression, unspecified: Secondary | ICD-10-CM | POA: Diagnosis not present

## 2023-02-24 DIAGNOSIS — Z87891 Personal history of nicotine dependence: Secondary | ICD-10-CM | POA: Diagnosis not present

## 2023-02-24 DIAGNOSIS — T783XXA Angioneurotic edema, initial encounter: Secondary | ICD-10-CM | POA: Diagnosis not present

## 2023-02-24 DIAGNOSIS — J439 Emphysema, unspecified: Secondary | ICD-10-CM | POA: Diagnosis not present

## 2023-02-24 DIAGNOSIS — Z888 Allergy status to other drugs, medicaments and biological substances status: Secondary | ICD-10-CM | POA: Diagnosis not present

## 2023-02-24 DIAGNOSIS — K219 Gastro-esophageal reflux disease without esophagitis: Secondary | ICD-10-CM | POA: Diagnosis not present

## 2023-02-24 DIAGNOSIS — E1169 Type 2 diabetes mellitus with other specified complication: Secondary | ICD-10-CM

## 2023-02-24 DIAGNOSIS — Z8249 Family history of ischemic heart disease and other diseases of the circulatory system: Secondary | ICD-10-CM | POA: Diagnosis not present

## 2023-02-24 DIAGNOSIS — R229 Localized swelling, mass and lump, unspecified: Secondary | ICD-10-CM | POA: Diagnosis not present

## 2023-02-24 DIAGNOSIS — I700 Atherosclerosis of aorta: Secondary | ICD-10-CM | POA: Diagnosis not present

## 2023-02-24 DIAGNOSIS — C966 Unifocal Langerhans-cell histiocytosis: Secondary | ICD-10-CM | POA: Diagnosis not present

## 2023-02-24 DIAGNOSIS — K1120 Sialoadenitis, unspecified: Secondary | ICD-10-CM | POA: Diagnosis not present

## 2023-02-24 DIAGNOSIS — E1159 Type 2 diabetes mellitus with other circulatory complications: Secondary | ICD-10-CM | POA: Diagnosis not present

## 2023-02-24 DIAGNOSIS — Z833 Family history of diabetes mellitus: Secondary | ICD-10-CM | POA: Diagnosis not present

## 2023-02-24 DIAGNOSIS — R220 Localized swelling, mass and lump, head: Secondary | ICD-10-CM | POA: Diagnosis not present

## 2023-02-24 LAB — CBC WITH DIFFERENTIAL/PLATELET
Abs Immature Granulocytes: 0.01 10*3/uL (ref 0.00–0.07)
Basophils Absolute: 0.1 10*3/uL (ref 0.0–0.1)
Basophils Relative: 1 %
Eosinophils Absolute: 0.2 10*3/uL (ref 0.0–0.5)
Eosinophils Relative: 3 %
HCT: 42.3 % (ref 36.0–46.0)
Hemoglobin: 13.6 g/dL (ref 12.0–15.0)
Immature Granulocytes: 0 %
Lymphocytes Relative: 43 %
Lymphs Abs: 2.4 10*3/uL (ref 0.7–4.0)
MCH: 28.7 pg (ref 26.0–34.0)
MCHC: 32.2 g/dL (ref 30.0–36.0)
MCV: 89.2 fL (ref 80.0–100.0)
Monocytes Absolute: 0.4 10*3/uL (ref 0.1–1.0)
Monocytes Relative: 6 %
Neutro Abs: 2.6 10*3/uL (ref 1.7–7.7)
Neutrophils Relative %: 47 %
Platelets: 293 10*3/uL (ref 150–400)
RBC: 4.74 MIL/uL (ref 3.87–5.11)
RDW: 17.8 % — ABNORMAL HIGH (ref 11.5–15.5)
WBC: 5.6 10*3/uL (ref 4.0–10.5)
nRBC: 0 % (ref 0.0–0.2)

## 2023-02-24 LAB — COMPREHENSIVE METABOLIC PANEL
ALT: 17 U/L (ref 0–44)
AST: 21 U/L (ref 15–41)
Albumin: 3.1 g/dL — ABNORMAL LOW (ref 3.5–5.0)
Alkaline Phosphatase: 131 U/L — ABNORMAL HIGH (ref 38–126)
Anion gap: 8 (ref 5–15)
BUN: 18 mg/dL (ref 8–23)
CO2: 29 mmol/L (ref 22–32)
Calcium: 8.8 mg/dL — ABNORMAL LOW (ref 8.9–10.3)
Chloride: 102 mmol/L (ref 98–111)
Creatinine, Ser: 1.46 mg/dL — ABNORMAL HIGH (ref 0.44–1.00)
GFR, Estimated: 39 mL/min — ABNORMAL LOW (ref >60)
Glucose, Bld: 109 mg/dL — ABNORMAL HIGH (ref 70–99)
Potassium: 3 mmol/L — ABNORMAL LOW (ref 3.5–5.1)
Sodium: 139 mmol/L (ref 135–145)
Total Bilirubin: 0.8 mg/dL (ref 0.3–1.2)
Total Protein: 7.1 g/dL (ref 6.5–8.1)

## 2023-02-24 MED ORDER — ACCU-CHEK GUIDE W/DEVICE KIT
PACK | 0 refills | Status: DC
Start: 2023-02-24 — End: 2024-08-06
  Filled 2023-02-24: qty 1, 30d supply, fill #0

## 2023-02-24 MED ORDER — IOHEXOL 350 MG/ML SOLN
80.0000 mL | Freq: Once | INTRAVENOUS | Status: AC | PRN
  Administered 2023-02-24: 80 mL via INTRAVENOUS

## 2023-02-24 MED ORDER — ACCU-CHEK GUIDE VI STRP
ORAL_STRIP | 12 refills | Status: DC
Start: 2023-02-24 — End: 2024-08-06
  Filled 2023-02-24: qty 100, 33d supply, fill #0
  Filled 2023-04-10: qty 100, 33d supply, fill #1
  Filled 2023-05-19 – 2023-05-22 (×2): qty 100, 33d supply, fill #2
  Filled 2023-07-20: qty 100, 33d supply, fill #3

## 2023-02-24 MED ORDER — ACCU-CHEK SOFTCLIX LANCETS MISC
6 refills | Status: DC
Start: 2023-02-24 — End: 2024-08-06
  Filled 2023-02-24: qty 100, 33d supply, fill #0
  Filled 2023-04-10: qty 100, 33d supply, fill #1
  Filled 2023-05-19 – 2023-05-22 (×2): qty 100, 33d supply, fill #2
  Filled 2023-07-20: qty 100, 33d supply, fill #3
  Filled 2023-10-14: qty 100, 33d supply, fill #4
  Filled 2023-12-11: qty 100, 33d supply, fill #5

## 2023-02-24 NOTE — Telephone Encounter (Signed)
Spoke with patient . Verified name & DOB   Advised patient to start taking the Semaglutide,0.25 or 0.5MG /DOS, 2 MG/3ML . Advised patient that this medication will not effect blood glucose as insulin does. Patient requested that she have manuel blood glucometer, while waiting for PA for the Freestyle. Patient informed that  Pharmacist  sent to [facility] Blood glucometer and supplies. Patient continued to be apprehensive about stating semaglutide. Reinforced education as to how Semaglutide works. Patient agreed to start  the once week injections night before bed. Encourage patient to call us if she had further concerns or questions.

## 2023-02-24 NOTE — Telephone Encounter (Signed)
Noted  

## 2023-02-24 NOTE — ED Provider Notes (Signed)
[facility] AT [facility] Provider Note   CSN: [id number] Arrival date & time: 02/24/23  1332     History  Chief Complaint  Patient presents with   Feet turning Blue    Krystal Allen is a 67 y.o. female.  Patient reports that both of her feet appeared blue when she was getting her hair done today.  Patient states that her daughter was doing her hair and noticed that the bottom of her feet looked blue.  Patient's daughter then became concerned because patient had a bluish appearance going up her legs to her knees.  Patient reports when she looks at her lower legs they looks blotchy to her.  Patient reports that that is not what her legs normally look like.  Patient denies any swelling to her legs she has not had any injury.  Patient denies any shortness of breath she denies any chest pain she is not having any abdominal pain.  Patient denies any open wounds.  She has not had a fever or chills.  Patient has a past medical history of hypertension diabetes emphysema aortic atherosclerosis and tear arthritis.  Patient is a smoker.  The history is provided by the patient. No language interpreter was used.       Home Medications Prior to Admission medications   Medication Sig Start Date End Date Taking? Authorizing Provider  Accu-Chek Softclix Lancets lancets Use to check blood sugar three times daily. E11.69 02/24/23   Marcine Matar, MD  acetaminophen (TYLENOL 8 HOUR) 650 MG CR tablet Take 1 tablet (650 mg total) by mouth 2 (two) times daily as needed for pain. 04/06/17   Funches, Gerilyn Nestle, MD  albuterol (PROAIR HFA) 108 (90 Base) MCG/ACT inhaler Inhale 2 puffs into the lungs every 4 (four) hours as needed for wheezing or shortness of breath. 02/15/23   Marcine Matar, MD  albuterol (PROVENTIL) (2.5 MG/3ML) 0.083% nebulizer solution TAKE 3 MLS BY NEBULIZATION EVERY 6 (SIX) HOURS AS NEEDED FOR WHEEZING OR SHORTNESS OF BREATH. 10/03/22   Marcine Matar,  MD  aspirin EC 81 MG tablet Take 1 tablet (81 mg total) by mouth daily. 05/12/20   Marcine Matar, MD  atorvastatin (LIPITOR) 80 MG tablet Take 1 tablet (80 mg total) by mouth daily. 05/11/22   Orbie Pyo, MD  Blood Glucose Monitoring Suppl (ACCU-CHEK GUIDE) w/Device KIT Use to check blood sugar three times daily. E11.69 02/24/23   Marcine Matar, MD  cetirizine (ZYRTEC) 10 MG tablet Take 1 tablet (10 mg total) by mouth daily. 02/07/22   Mayers, Cari S, PA-C  Continuous Glucose Receiver (FREESTYLE LIBRE 2 READER) DEVI Use to check blood sugar continuously throughout the day. Change sensors once every 14 days. 02/20/23   Marcine Matar, MD  Continuous Glucose Sensor (FREESTYLE LIBRE 2 SENSOR) MISC Use to check blood sugar continuously throughout the day. 02/20/23   Marcine Matar, MD  diphenhydrAMINE (BENADRYL) 25 mg capsule Take 25-50 mg by mouth every 6 (six) hours as needed for itching or allergies.    [provider]  doxycycline (VIBRAMYCIN) 100 MG capsule Take 1 capsule (100 mg total) by mouth 2 (two) times daily. 01/29/23   Elson Areas, PA-C  Elastic Bandages & Supports (MEDICAL COMPRESSION STOCKINGS) MISC R60.0 01/25/22   Claiborne Rigg, NP  EPINEPHrine 0.3 mg/0.3 mL IJ SOAJ injection Inject 0.3 mg into the muscle as needed for anaphylaxis. 10/26/22   Marcine Matar, MD  Evolocumab (REPATHA SURECLICK) 140 MG/ML SOAJ Inject 140 mg into the skin every 14 (fourteen) days. 10/06/22   Hilty, Lisette Abu, MD  ezetimibe (ZETIA) 10 MG tablet Take 1 tablet (10 mg total) by mouth daily. 10/03/22   Orbie Pyo, MD  famotidine (PEPCID) 20 MG tablet Take 1 tablet (20 mg total) by mouth 2 (two) times daily as needed (hives). 12/02/22   Birder Robson, MD  fexofenadine [facility] ALLERGY) 180 MG tablet Take 1 tablet (180 mg total) by mouth 2 (two) times daily as needed (hives). 12/02/22   Birder Robson, MD  fluticasone (FLONASE) 50 MCG/ACT nasal spray Place 2 sprays into both  nostrils daily. 12/02/22   Birder Robson, MD  fluticasone furoate-vilanterol (BREO ELLIPTA) 200-25 MCG/ACT AEPB Inhale 1 puff into the lungs once daily. 11/03/22   Luciano Cutter, MD  furosemide (LASIX) 20 MG tablet Take 1 tablet (20 mg total) by mouth daily as needed (for weight gain of 2-3 lbs overnight or swelling in legs/ankles). 02/23/23   Alver Sorrow, NP  glucose blood (ACCU-CHEK GUIDE) test strip Use to check blood sugar three times daily. E11.69 02/24/23   Marcine Matar, MD  hydrALAZINE (APRESOLINE) 10 MG tablet Take 1 tablet (10 mg total) by mouth 2 (two) times daily. 02/01/23   Marcine Matar, MD  hydrochlorothiazide (HYDRODIURIL) 25 MG tablet Take 1 tablet (25 mg total) by mouth daily. 02/01/23   Marcine Matar, MD  hydrOXYzine (ATARAX) 25 MG tablet Take 1 tablet (25 mg total) by mouth every 8 (eight) hours as needed for up to 15 doses for itching. 08/10/22   Terald Sleeper, MD  Olopatadine HCl 0.2 % SOLN Place 1 drop into both eyes daily as needed (allergies). 12/02/22   Birder Robson, MD  omeprazole (PRILOSEC) 20 MG capsule Take 1 capsule (20 mg total) by mouth daily. 09/30/22   Marcine Matar, MD  oxybutynin (DITROPAN XL) 5 MG 24 hr tablet Take 1 tablet (5 mg total) by mouth at bedtime. 02/02/23   Freddy Finner, NP  phentermine 37.5 MG capsule Take 1 capsule (37.5 mg total) by mouth every morning. 12/31/22   Marcine Matar, MD  potassium chloride SA (KLOR-CON M) 20 MEQ tablet Take 2 tablets ( total) by mouth every morning and 1 tablet ( total) every evening for the next 3 days (02/24/23, 02/25/23, and 02/26/23). Starting on 02/27/23 take 1 tablet by mouth twice daily. 02/23/23   Alver Sorrow, NP  Respiratory Therapy Supplies (FLUTTER) DEVI Use as directed 12/01/17   Chilton Greathouse, MD  Semaglutide,0.25 or 0.5MG /DOS, 2 MG/3ML SOPN Inject 0.25 mg into the skin once a week. 02/20/23   Marcine Matar, MD  sodium chloride (OCEAN) 0.65 % nasal spray Place 2  sprays into both nostrils every 4 (four) hours. Patient taking differently: Place 2 sprays into the nose every 4 (four) hours as needed for congestion. 08/20/21   Gwenevere Abbot, MD  Tiotropium Bromide Monohydrate (SPIRIVA RESPIMAT) 2.5 MCG/ACT AERS Inhale 2 puffs into the lungs daily. 11/03/22   Luciano Cutter, MD      Allergies    Sulfa antibiotics, Chantix [varenicline tartrate], Fish allergy, Hygroton [chlorthalidone], Penicillins, and Latex    Review of Systems   Review of Systems  Skin:  Positive for color change.  All other systems reviewed and are negative.   Physical Exam Updated Vital Signs BP 124/88   Pulse 81   Temp (!) 97.3 F (36.3  C) (Oral)   Resp (!) 21   SpO2 98%  Physical Exam Vitals and nursing note reviewed.  Constitutional:      General: She is not in acute distress.    Appearance: She is well-developed.  HENT:     Head: Normocephalic and atraumatic.     Mouth/Throat:     Mouth: Mucous membranes are moist.  Eyes:     Conjunctiva/sclera: Conjunctivae normal.  Cardiovascular:     Rate and Rhythm: Normal rate and regular rhythm.     Pulses: Normal pulses.     Heart sounds: Normal heart sounds. No murmur heard. Pulmonary:     Effort: Pulmonary effort is normal. No respiratory distress.     Breath sounds: Normal breath sounds.  Abdominal:     General: Abdomen is flat.     Palpations: Abdomen is soft.     Tenderness: There is no abdominal tenderness.  Musculoskeletal:        General: No swelling, tenderness or deformity. Normal range of motion.     Cervical back: Normal range of motion and neck supple.     Right lower leg: No edema.     Left lower leg: No edema.     Comments: Negative Homans bilaterally patient has palpable pulses bilaterally.  Skin:    General: Skin is warm and dry.     Capillary Refill: Capillary refill takes less than 2 seconds.  Neurological:     Mental Status: She is alert.  Psychiatric:        Mood and Affect: Mood normal.      ED Results / Procedures / Treatments   Labs (all labs ordered are listed, but only abnormal results are displayed) Labs Reviewed  CBC WITH DIFFERENTIAL/PLATELET - Abnormal; Notable for the following components:      Result Value   RDW 17.8 (*)    All other components within normal limits  COMPREHENSIVE METABOLIC PANEL - Abnormal; Notable for the following components:   Potassium 3.0 (*)    Glucose, Bld 109 (*)    Creatinine, Ser 1.46 (*)    Calcium 8.8 (*)    Albumin 3.1 (*)    Alkaline Phosphatase 131 (*)    GFR, Estimated 39 (*)    All other components within normal limits    EKG None  Radiology CT Angio Aortobifemoral W and/or Wo Contrast  Result Date: 02/24/2023 CLINICAL DATA:  Peripheral arterial disease, asymptomatic. Feet turning blue. EXAM: CT ANGIOGRAPHY OF ABDOMINAL AORTA WITH ILIOFEMORAL RUNOFF TECHNIQUE: Multidetector CT imaging of the abdomen, pelvis and lower extremities was performed using the standard protocol during bolus administration of intravenous contrast. Multiplanar CT image reconstructions and MIPs were obtained to evaluate the vascular anatomy. RADIATION DOSE REDUCTION: This exam was performed according to the departmental dose-optimization program which includes automated exposure control, adjustment of the mA and/or kV according to patient size and/or use of iterative reconstruction technique. CONTRAST:  80mL OMNIPAQUE IOHEXOL 350 MG/ML SOLN COMPARISON:  None Available. FINDINGS: VASCULAR Aorta: Normal caliber aorta without aneurysm, dissection, vasculitis or significant stenosis. Diffuse aortic calcification. Celiac: Patent without evidence of aneurysm, dissection, vasculitis or significant stenosis. SMA: Patent without evidence of aneurysm, dissection, vasculitis or significant stenosis. Renals: Both renal arteries are patent without evidence of aneurysm, dissection, vasculitis, fibromuscular dysplasia or significant stenosis. IMA: Patent without  evidence of aneurysm, dissection, vasculitis or significant stenosis. RIGHT Lower Extremity Inflow: Diffuse calcification throughout the common, internal, and external iliac arteries. Vessels remain patent. Stenosis of the proximal internal  iliac artery representing about 50% diameter reduction. Outflow: Common, superficial and profunda femoral arteries and the popliteal artery are patent without evidence of aneurysm, dissection, vasculitis or significant stenosis. Scattered vascular calcifications. Runoff: Three-vessel runoff to the right ankle. LEFT Lower Extremity Inflow: Diffuse calcification throughout the common, internal, and external iliac arteries. Vessels remain patent. Moderate stenosis of the origin of the left common iliac artery, representing about 40% diameter reduction. Stenosis of the proximal internal iliac artery representing about 70% diameter reduction. Outflow: Common, superficial and profunda femoral arteries and the popliteal artery are patent without evidence of aneurysm, dissection, vasculitis or significant stenosis. Scattered vascular calcifications. Runoff: Three-vessel runoff to the left ankle Veins: No obvious venous abnormality within the limitations of this arterial phase study. Review of the MIP images confirms the above findings. NON-VASCULAR Lower chest: Interstitial changes in the lung bases may represent fibrosis or edema. Hepatobiliary: Mild diffuse fatty infiltration of the liver. No focal lesions. Gallbladder and bile ducts are normal. Pancreas: Unremarkable. No pancreatic ductal dilatation or surrounding inflammatory changes. Spleen: Normal in size without focal abnormality. Adrenals/Urinary Tract: Adrenal glands are unremarkable. Kidneys are normal, without renal calculi, focal lesion, or hydronephrosis. Bladder is unremarkable. Stomach/Bowel: Stomach, small bowel, and colon are not abnormally distended. No wall thickening or inflammatory changes. Appendix is normal.  Lymphatic: No significant lymphadenopathy. Reproductive: No pelvic masses. Other: No free air or free fluid in the abdomen. Abdominal wall musculature appears intact. Musculoskeletal: Degenerative changes in the spine. IMPRESSION: VASCULAR Diffuse atherosclerotic calcification of the abdominal aorta, major branch vessels, and lower extremity runoff vessels. Focal areas of moderate stenosis in the internal iliac arteries bilaterally. Three-vessel runoff is demonstrated to the ankles. NON-VASCULAR No acute process demonstrated in the abdomen or pelvis. Fatty infiltration of the liver. Interstitial changes in the lung bases could represent fibrosis or edema. Electronically Signed   By: Burman Nieves M.D.   On: 02/24/2023 16:21    Procedures Procedures    Medications Ordered in ED Medications  iohexol (OMNIPAQUE) 350 MG/ML injection 80 mL (80 mLs Intravenous Contrast Given 02/24/23 1607)    ED Course/ Medical Decision Making/ A&P                             Medical Decision Making Patient complains that her legs appeared blue earlier.  Patient reports they look better now but they still appear splotchy  Amount and/or Complexity of Data Reviewed Independent Historian:     Details: His daughter Chanetta Marshall helps provide history External Data Reviewed: notes.    Details: Since primary care notes and cardiology notes are reviewed Labs: ordered. Decision-making details documented in ED Course.    Details: Labs ordered reviewed and interpreted potation's potassium is low at 3.0.  Creatinine is 1.46.  This is at patient's baseline Radiology: ordered and independent interpretation performed. Decision-making details documented in ED Course.    Details: CT angio shows diffuse arthrosclerosis  Discussion of management or test interpretation with external provider(s): Pt counseled on results.  Pt is advised to follow up with vascular surgery for recheck   Risk Prescription drug  management.           Final Clinical Impression(s) / ED Diagnoses Final diagnoses:  Atherosclerotic vascular disease    Rx / DC Orders ED Discharge Orders     None      An After Visit Summary was printed and given to the patient.    Elson Areas,  PA-C 02/24/23 1705    Alvira Monday, MD 02/24/23 2300

## 2023-02-24 NOTE — Telephone Encounter (Signed)
  Chief Complaint: numbness Symptoms: numbness in LE, bluish color in feet and up into LE, cold to touch Frequency: today Pertinent Negatives: Patient denies pain or swelling  Disposition: [x] ED /[] Urgent Care (no appt availability in office) / [] Appointment(In office/virtual)/ []  [place] [facility]/ [] Home Care/ [] Refused Recommended Disposition /[] [place] [facility]/ []  Follow-up with PCP Additional Notes: pt stats her feet and blue in color, spoke with daughter, she was doing pt's hair today since about 1000, said blue feet was first thing she noticed, as pt sat to have hair done blue color went up into LE and had pt elevated LE. LE are cold to touch as well. She did call Cardiology and they said she had exam back in March and everything was normal and didn't have any OV to schedule. Advised daughter and pt to go ED for evaluation. Daughter asked if anywhere else could do dopplers rather than going to ED, advised her UC doesn't do them and no appts as well and would have to be scheduled as OP and that could be a while, advised ED was best and fastest place to get evaluation.   Reason for Disposition  Patient sounds very sick or weak to the triager  Answer Assessment - Initial Assessment Questions 1. SYMPTOM: "What is the main symptom you are concerned about?" (e.g., weakness, numbness)     Numbness and turning blue  2. ONSET: "When did this start?" (minutes, hours, days; while sleeping)     This morning 4. PATTERN "Does this come and go, or has it been constant since it started?"  "Is it present now?"     constant 6. NEUROLOGIC SYMPTOMS: "Have you had any of the following symptoms: headache, dizziness, vision loss, double vision, changes in speech, unsteady on your feet?"     no 7. OTHER SYMPTOMS: "Do you have any other symptoms?"     Feet blue going up into LE  Protocols used: Neurologic Deficit-A-AH

## 2023-02-24 NOTE — Telephone Encounter (Signed)
  Chief Complaint: Medication question Symptoms:  Frequency:  Pertinent Negatives: Patient denies  Disposition: [] ED /[] Urgent Care (no appt availability in office) / [] Appointment(In office/virtual)/ []  [place] [facility]/ [] Home Care/ [] Refused Recommended Disposition /[] [place] [facility]/ [x]  Follow-up with PCP Additional Notes: Should pt wait until she has a glucose meter to start Semaglutide? Please advise.   Summary: Medication Advice   Pt is calling to ask should she wait to take the Rx #: [id number] Semaglutide,0.25 or 0.5MG /DOS, 2 MG/3ML [id number] [[id number]] until she receives her glucose meter? Please advise     Reason for Disposition  [1] Caller has NON-URGENT medicine question about med that PCP prescribed AND [2] triager unable to answer question  Answer Assessment - Initial Assessment Questions 1. NAME of MEDICINE: "What medicine(s) are you calling about?"     Semaglutide 2. QUESTION: "What is your question?" (e.g., double dose of medicine, side effect)     Should pt wait until she has glucose monitor to start this medication? 3. PRESCRIBER: "Who prescribed the medicine?" Reason: if prescribed by specialist, call should be referred to that group.     Dr. Laural Benes  Protocols used: Medication Question Call-A-AH

## 2023-02-24 NOTE — Telephone Encounter (Signed)
Thank you!  Called & LVM for the patient to call for an update if needed. Please see message below.

## 2023-02-24 NOTE — Telephone Encounter (Signed)
Patient calling in because her toes are blue. Please advise

## 2023-02-24 NOTE — ED Notes (Signed)
Pt is a&ox4, warm and dry to touch. Pt complains of having "blue" feet and lower legs earlier today when she was getting her hair done. Pt's legs and feet are not currently blue. They care of normal color for race. PMS is present and intact. Pt denies any cp/shob/n/v/d/weakness/numbness or tingling. Pt does not want to be in a gown. Pt attached to monitor/vitals. Side rail is up, call light within reach.

## 2023-02-24 NOTE — ED Triage Notes (Signed)
Pt came in via POV d/t stating her feet look blue, sensation still intact & they do feel heavy. A/OX4, denies pain.

## 2023-02-24 NOTE — Telephone Encounter (Signed)
Spoke with pt and pt's daughter, DPR who reports she is doing patients hair who is barefoot and noticed pt's toes and feet are blue and discoloration is beginning to spread up to lower extremities.  Pt's daughter reports pt's feet are cold to touch.  Pt denies swelling or pain at this time.  She has not been wearing her compression stockings.  Pt diagnosed this week as being diabetic and is to start Ozempic and blood sugar monitoring today. Pt had lower vascular study completed 03/24 which was normal.  Arterial studies in 2018 were normal.  Echo 06/23 shows normal heart pumping and valve function. Pt's daughter advised due to patients current symptoms recommend pt contact her PCP to make her aware of symptoms and would have pt further evaluated in the ED.  Pt's daughter verbalizes understanding and agrees with current plan.

## 2023-02-26 ENCOUNTER — Emergency Department (HOSPITAL_COMMUNITY): Payer: 59

## 2023-02-26 ENCOUNTER — Other Ambulatory Visit: Payer: Self-pay

## 2023-02-26 ENCOUNTER — Encounter (HOSPITAL_COMMUNITY): Payer: Self-pay | Admitting: *Deleted

## 2023-02-26 ENCOUNTER — Inpatient Hospital Stay (HOSPITAL_COMMUNITY)
Admission: EM | Admit: 2023-02-26 | Discharge: 2023-02-28 | DRG: 125 | Disposition: A | Discharge status: Home / Self Care | Payer: 59 | Attending: Internal Medicine | Admitting: Internal Medicine

## 2023-02-26 DIAGNOSIS — E1122 Type 2 diabetes mellitus with diabetic chronic kidney disease: Secondary | ICD-10-CM | POA: Diagnosis present

## 2023-02-26 DIAGNOSIS — Z6841 Body Mass Index (BMI) 40.0 and over, adult: Secondary | ICD-10-CM

## 2023-02-26 DIAGNOSIS — C966 Unifocal Langerhans-cell histiocytosis: Secondary | ICD-10-CM | POA: Diagnosis present

## 2023-02-26 DIAGNOSIS — J849 Interstitial pulmonary disease, unspecified: Secondary | ICD-10-CM | POA: Diagnosis present

## 2023-02-26 DIAGNOSIS — K1120 Sialoadenitis, unspecified: Secondary | ICD-10-CM | POA: Diagnosis present

## 2023-02-26 DIAGNOSIS — L03213 Periorbital cellulitis: Principal | ICD-10-CM

## 2023-02-26 DIAGNOSIS — F32A Depression, unspecified: Secondary | ICD-10-CM | POA: Diagnosis present

## 2023-02-26 DIAGNOSIS — E669 Obesity, unspecified: Secondary | ICD-10-CM | POA: Diagnosis present

## 2023-02-26 DIAGNOSIS — I700 Atherosclerosis of aorta: Secondary | ICD-10-CM | POA: Diagnosis present

## 2023-02-26 DIAGNOSIS — E1159 Type 2 diabetes mellitus with other circulatory complications: Secondary | ICD-10-CM | POA: Diagnosis present

## 2023-02-26 DIAGNOSIS — Z8249 Family history of ischemic heart disease and other diseases of the circulatory system: Secondary | ICD-10-CM

## 2023-02-26 DIAGNOSIS — Z90710 Acquired absence of both cervix and uterus: Secondary | ICD-10-CM

## 2023-02-26 DIAGNOSIS — Z7982 Long term (current) use of aspirin: Secondary | ICD-10-CM

## 2023-02-26 DIAGNOSIS — Z79899 Other long term (current) drug therapy: Secondary | ICD-10-CM

## 2023-02-26 DIAGNOSIS — N1832 Chronic kidney disease, stage 3b: Secondary | ICD-10-CM | POA: Diagnosis present

## 2023-02-26 DIAGNOSIS — Z91040 Latex allergy status: Secondary | ICD-10-CM

## 2023-02-26 DIAGNOSIS — H11423 Conjunctival edema, bilateral: Principal | ICD-10-CM | POA: Diagnosis present

## 2023-02-26 DIAGNOSIS — Z7985 Long-term (current) use of injectable non-insulin antidiabetic drugs: Secondary | ICD-10-CM

## 2023-02-26 DIAGNOSIS — Z882 Allergy status to sulfonamides status: Secondary | ICD-10-CM

## 2023-02-26 DIAGNOSIS — J439 Emphysema, unspecified: Secondary | ICD-10-CM | POA: Diagnosis present

## 2023-02-26 DIAGNOSIS — E872 Acidosis, unspecified: Secondary | ICD-10-CM | POA: Diagnosis present

## 2023-02-26 DIAGNOSIS — K219 Gastro-esophageal reflux disease without esophagitis: Secondary | ICD-10-CM | POA: Diagnosis present

## 2023-02-26 DIAGNOSIS — I129 Hypertensive chronic kidney disease with stage 1 through stage 4 chronic kidney disease, or unspecified chronic kidney disease: Secondary | ICD-10-CM | POA: Diagnosis present

## 2023-02-26 DIAGNOSIS — T783XXA Angioneurotic edema, initial encounter: Secondary | ICD-10-CM | POA: Diagnosis present

## 2023-02-26 DIAGNOSIS — E785 Hyperlipidemia, unspecified: Secondary | ICD-10-CM | POA: Diagnosis present

## 2023-02-26 DIAGNOSIS — Z91013 Allergy to seafood: Secondary | ICD-10-CM

## 2023-02-26 DIAGNOSIS — Z880 Allergy status to penicillin: Secondary | ICD-10-CM

## 2023-02-26 DIAGNOSIS — R911 Solitary pulmonary nodule: Principal | ICD-10-CM

## 2023-02-26 DIAGNOSIS — M1990 Unspecified osteoarthritis, unspecified site: Secondary | ICD-10-CM | POA: Diagnosis present

## 2023-02-26 DIAGNOSIS — Z87891 Personal history of nicotine dependence: Secondary | ICD-10-CM

## 2023-02-26 DIAGNOSIS — X58XXXA Exposure to other specified factors, initial encounter: Secondary | ICD-10-CM | POA: Diagnosis present

## 2023-02-26 DIAGNOSIS — I152 Hypertension secondary to endocrine disorders: Secondary | ICD-10-CM | POA: Diagnosis present

## 2023-02-26 DIAGNOSIS — Z833 Family history of diabetes mellitus: Secondary | ICD-10-CM

## 2023-02-26 DIAGNOSIS — Z888 Allergy status to other drugs, medicaments and biological substances status: Secondary | ICD-10-CM

## 2023-02-26 DIAGNOSIS — Z7951 Long term (current) use of inhaled steroids: Secondary | ICD-10-CM

## 2023-02-26 LAB — I-STAT CHEM 8, ED
BUN: 14 mg/dL (ref 8–23)
Calcium, Ion: 1.14 mmol/L — ABNORMAL LOW (ref 1.15–1.40)
Chloride: 104 mmol/L (ref 98–111)
Creatinine, Ser: 1.4 mg/dL — ABNORMAL HIGH (ref 0.44–1.00)
Glucose, Bld: 107 mg/dL — ABNORMAL HIGH (ref 70–99)
HCT: 41 % (ref 36.0–46.0)
Hemoglobin: 13.9 g/dL (ref 12.0–15.0)
Potassium: 3.2 mmol/L — ABNORMAL LOW (ref 3.5–5.1)
Sodium: 142 mmol/L (ref 135–145)
TCO2: 23 mmol/L (ref 22–32)

## 2023-02-26 LAB — CBC WITH DIFFERENTIAL/PLATELET
Abs Immature Granulocytes: 0.02 10*3/uL (ref 0.00–0.07)
Basophils Absolute: 0 10*3/uL (ref 0.0–0.1)
Basophils Relative: 1 %
Eosinophils Absolute: 0 10*3/uL (ref 0.0–0.5)
Eosinophils Relative: 1 %
HCT: 43.6 % (ref 36.0–46.0)
Hemoglobin: 14 g/dL (ref 12.0–15.0)
Immature Granulocytes: 0 %
Lymphocytes Relative: 30 %
Lymphs Abs: 1.9 10*3/uL (ref 0.7–4.0)
MCH: 28.5 pg (ref 26.0–34.0)
MCHC: 32.1 g/dL (ref 30.0–36.0)
MCV: 88.8 fL (ref 80.0–100.0)
Monocytes Absolute: 0.3 10*3/uL (ref 0.1–1.0)
Monocytes Relative: 5 %
Neutro Abs: 4.2 10*3/uL (ref 1.7–7.7)
Neutrophils Relative %: 63 %
Platelets: 281 10*3/uL (ref 150–400)
RBC: 4.91 MIL/uL (ref 3.87–5.11)
RDW: 17.9 % — ABNORMAL HIGH (ref 11.5–15.5)
WBC: 6.5 10*3/uL (ref 4.0–10.5)
nRBC: 0 % (ref 0.0–0.2)

## 2023-02-26 MED ORDER — LORAZEPAM 2 MG/ML IJ SOLN: 0.5000 mg | Freq: Once | INTRAMUSCULAR | Status: DC

## 2023-02-26 MED ORDER — FLUORESCEIN SODIUM 1 MG OP STRP
1.0000 | ORAL_STRIP | Freq: Once | OPHTHALMIC | Status: AC
  Administered 2023-02-26: 1 via OPHTHALMIC
  Filled 2023-02-26: qty 1

## 2023-02-26 MED ORDER — OXYCODONE-ACETAMINOPHEN 5-325 MG PO TABS
1.0000 | ORAL_TABLET | Freq: Once | ORAL | Status: AC
  Administered 2023-02-26: 1 via ORAL
  Filled 2023-02-26: qty 1

## 2023-02-26 MED ORDER — TETRACAINE HCL 0.5 % OP SOLN
2.0000 [drp] | Freq: Once | OPHTHALMIC | Status: AC
  Administered 2023-02-26: 2 [drp] via OPHTHALMIC
  Filled 2023-02-26: qty 4

## 2023-02-26 MED ORDER — VANCOMYCIN HCL 2000 MG/400ML IV SOLN
2000.0000 mg | Freq: Once | INTRAVENOUS | Status: AC
  Administered 2023-02-27: 2000 mg via INTRAVENOUS
  Filled 2023-02-26: qty 400

## 2023-02-26 MED ORDER — LORAZEPAM 2 MG/ML IJ SOLN
1.0000 mg | Freq: Once | INTRAMUSCULAR | Status: AC
  Administered 2023-02-26: 1 mg via INTRAVENOUS
  Filled 2023-02-26: qty 1

## 2023-02-26 MED ORDER — FENTANYL CITRATE PF 50 MCG/ML IJ SOSY
50.0000 ug | PREFILLED_SYRINGE | Freq: Once | INTRAMUSCULAR | Status: AC
  Administered 2023-02-26: 50 ug via INTRAVENOUS
  Filled 2023-02-26: qty 1

## 2023-02-26 MED ORDER — LORAZEPAM 2 MG/ML IJ SOLN: 2.0000 mg | Freq: Once | INTRAMUSCULAR | Status: DC

## 2023-02-26 MED ORDER — SODIUM CHLORIDE 0.9 % IV SOLN
1.0000 g | Freq: Once | INTRAVENOUS | Status: AC
  Administered 2023-02-27: 1 g via INTRAVENOUS
  Filled 2023-02-26: qty 10

## 2023-02-26 MED ORDER — SODIUM CHLORIDE 0.9 % IV BOLUS
500.0000 mL | Freq: Once | INTRAVENOUS | Status: AC
  Administered 2023-02-26: 500 mL via INTRAVENOUS

## 2023-02-26 MED ORDER — IOHEXOL 350 MG/ML SOLN
50.0000 mL | Freq: Once | INTRAVENOUS | Status: AC | PRN
  Administered 2023-02-26: 50 mL via INTRAVENOUS

## 2023-02-26 MED ORDER — DIPHENHYDRAMINE HCL 50 MG/ML IJ SOLN
25.0000 mg | Freq: Once | INTRAMUSCULAR | Status: AC
  Administered 2023-02-26: 25 mg via INTRAVENOUS
  Filled 2023-02-26: qty 1

## 2023-02-26 NOTE — ED Triage Notes (Signed)
PT states woke up with am with a knot over her L eye.  Now, both eyes are red, L eye and ear are red and swollen with hives on her L forehead.  Airway patent.  Pt states she has taken 1 tablet of benadryl at 1530 and an allergy pill with no relief.  She tried to give herself an epi pen, but she does not think the medication made it into her body.

## 2023-02-26 NOTE — ED Notes (Signed)
Pt appears to be very anxious and agitated. Denies worsening vision. Pt also requesting ice pack for eye. Provider made aware.

## 2023-02-26 NOTE — Discharge Instructions (Addendum)
  Follow with Primary MD Marcine Matar, MD in 7 days, follow-up with your allergist within a week of discharge  Get CBC, CMP, DCM-  checked next visit with your primary MD    Activity: As tolerated with Full fall precautions use walker/cane & assistance as needed  Disposition Home    Diet: Heart Healthy low carbohydrate diet, check your CBGs q. ACH S.  Special Instructions: If you have smoked or chewed Tobacco  in the last 2 yrs please stop smoking, stop any regular Alcohol  and or any Recreational drug use.  On your next visit with your primary care physician please Get Medicines reviewed and adjusted.  Please request your Prim.MD to go over all Hospital Tests and Procedure/Radiological results at the follow up, please get all Hospital records sent to your Prim MD by signing hospital release before you go home.  If you experience worsening of your admission symptoms, develop shortness of breath, life threatening emergency, suicidal or homicidal thoughts you must seek medical attention immediately by calling 911 or calling your MD immediately  if symptoms less severe.  You Must read complete instructions/literature along with all the possible adverse reactions/side effects for all the Medicines you take and that have been prescribed to you. Take any new Medicines after you have completely understood and accpet all the possible adverse reactions/side effects.

## 2023-02-26 NOTE — ED Notes (Signed)
Pt transported to CT ?

## 2023-02-26 NOTE — ED Provider Notes (Signed)
[name] [facility] AT [facility] Provider Note   CSN: [id number] Arrival date & time: 02/26/23  1735     History  Chief Complaint  Patient presents with   Allergic Reaction    Krystal Allen is a 67 y.o. female.  HPI    67 year old patient comes in with chief complaint of left-sided facial swelling. Patient has past medical history of hypertension, hyperlipidemia, tobacco use disorder, and severe allergic reactions.  Patient comes to the emergency room today with chief complaint of left-sided facial swelling.  Patient indicates that Krystal Allen woke up this morning and noted a knot over her left eye.  As the day has progressed, both of her eyes have turned red and Krystal Allen is having pain over the left part of her face along with associated swelling.  Krystal Allen suspect that most likely Krystal Allen was having severe allergic reaction, gave herself an EpiPen and took Benadryl.  Krystal Allen has had severe allergic reactions in the past, but the current symptoms are not typical of allergic reaction.  Patient denies vision change.  Home Medications Prior to Admission medications   Medication Sig Start Date End Date Taking? Authorizing Provider  Accu-Chek Softclix Lancets lancets Use to check blood sugar three times daily. E11.69 02/24/23   Marcine Matar, MD  acetaminophen (TYLENOL 8 HOUR) 650 MG CR tablet Take 1 tablet (650 mg total) by mouth 2 (two) times daily as needed for pain. 04/06/17   Funches, Gerilyn Nestle, MD  albuterol (PROAIR HFA) 108 (90 Base) MCG/ACT inhaler Inhale 2 puffs into the lungs every 4 (four) hours as needed for wheezing or shortness of breath. 02/15/23   Marcine Matar, MD  albuterol (PROVENTIL) (2.5 MG/3ML) 0.083% nebulizer solution TAKE 3 MLS BY NEBULIZATION EVERY 6 (SIX) HOURS AS NEEDED FOR WHEEZING OR SHORTNESS OF BREATH. 10/03/22   Marcine Matar, MD  aspirin EC 81 MG tablet Take 1 tablet (81 mg total) by mouth daily. 05/12/20   Marcine Matar, MD   atorvastatin (LIPITOR) 80 MG tablet Take 1 tablet (80 mg total) by mouth daily. 05/11/22   Orbie Pyo, MD  Blood Glucose Monitoring Suppl (ACCU-CHEK GUIDE) w/Device KIT Use to check blood sugar three times daily. E11.69 02/24/23   Marcine Matar, MD  cetirizine (ZYRTEC) 10 MG tablet Take 1 tablet (10 mg total) by mouth daily. 02/07/22   Mayers, Cari S, PA-C  Continuous Glucose Receiver (FREESTYLE LIBRE 2 READER) DEVI Use to check blood sugar continuously throughout the day. Change sensors once every 14 days. 02/20/23   Marcine Matar, MD  Continuous Glucose Sensor (FREESTYLE LIBRE 2 SENSOR) MISC Use to check blood sugar continuously throughout the day. 02/20/23   Marcine Matar, MD  diphenhydrAMINE (BENADRYL) 25 mg capsule Take 25-50 mg by mouth every 6 (six) hours as needed for itching or allergies.    [provider]  doxycycline (VIBRAMYCIN) 100 MG capsule Take 1 capsule (100 mg total) by mouth 2 (two) times daily. 01/29/23   Elson Areas, PA-C  Elastic Bandages & Supports (MEDICAL COMPRESSION STOCKINGS) MISC R60.0 01/25/22   Claiborne Rigg, NP  EPINEPHrine 0.3 mg/0.3 mL IJ SOAJ injection Inject 0.3 mg into the muscle as needed for anaphylaxis. 10/26/22   Marcine Matar, MD  Evolocumab (REPATHA SURECLICK) 140 MG/ML SOAJ Inject 140 mg into the skin every 14 (fourteen) days. 10/06/22   Hilty, Lisette Abu, MD  ezetimibe (ZETIA) 10 MG tablet Take 1 tablet (10 mg total) by mouth  daily. 10/03/22   Orbie Pyo, MD  famotidine (PEPCID) 20 MG tablet Take 1 tablet (20 mg total) by mouth 2 (two) times daily as needed (hives). 12/02/22   Birder Robson, MD  fexofenad[facility] ALLERGY) 180 MG tablet Take 1 tablet (180 mg total) by mouth 2 (two) times daily as needed (hives). 12/02/22   Birder Robson, MD  fluticasone (FLONASE) 50 MCG/ACT nasal spray Place 2 sprays into both nostrils daily. 12/02/22   Birder Robson, MD  fluticasone furoate-vilanterol (BREO ELLIPTA) 200-25 MCG/ACT  AEPB Inhale 1 puff into the lungs once daily. 11/03/22   Luciano Cutter, MD  furosemide (LASIX) 20 MG tablet Take 1 tablet (20 mg total) by mouth daily as needed (for weight gain of 2-3 lbs overnight or swelling in legs/ankles). 02/23/23   Alver Sorrow, NP  glucose blood (ACCU-CHEK GUIDE) test strip Use to check blood sugar three times daily. E11.69 02/24/23   Marcine Matar, MD  hydrALAZINE (APRESOLINE) 10 MG tablet Take 1 tablet (10 mg total) by mouth 2 (two) times daily. 02/01/23   Marcine Matar, MD  hydrochlorothiazide (HYDRODIURIL) 25 MG tablet Take 1 tablet (25 mg total) by mouth daily. 02/01/23   Marcine Matar, MD  hydrOXYzine (ATARAX) 25 MG tablet Take 1 tablet (25 mg total) by mouth every 8 (eight) hours as needed for up to 15 doses for itching. 08/10/22   Terald Sleeper, MD  Olopatadine HCl 0.2 % SOLN Place 1 drop into both eyes daily as needed (allergies). 12/02/22   Birder Robson, MD  omeprazole (PRILOSEC) 20 MG capsule Take 1 capsule (20 mg total) by mouth daily. 09/30/22   Marcine Matar, MD  oxybutynin (DITROPAN XL) 5 MG 24 hr tablet Take 1 tablet (5 mg total) by mouth at bedtime. 02/02/23   Freddy Finner, NP  phentermine 37.5 MG capsule Take 1 capsule (37.5 mg total) by mouth every morning. 12/31/22   Marcine Matar, MD  potassium chloride SA (KLOR-CON M) 20 MEQ tablet Take 2 tablets ( total) by mouth every morning and 1 tablet ( total) every evening for the next 3 days (02/24/23, 02/25/23, and 02/26/23). Starting on 02/27/23 take 1 tablet by mouth twice daily. 02/23/23   Alver Sorrow, NP  Respiratory Therapy Supplies (FLUTTER) DEVI Use as directed 12/01/17   Chilton Greathouse, MD  Semaglutide,0.25 or 0.5MG /DOS, 2 MG/3ML SOPN Inject 0.25 mg into the skin once a week. 02/20/23   Marcine Matar, MD  sodium chloride (OCEAN) 0.65 % nasal spray Place 2 sprays into both nostrils every 4 (four) hours. Patient taking differently: Place 2 sprays into the nose  every 4 (four) hours as needed for congestion. 08/20/21   Gwenevere Abbot, MD  Tiotropium Bromide Monohydrate (SPIRIVA RESPIMAT) 2.5 MCG/ACT AERS Inhale 2 puffs into the lungs daily. 11/03/22   Luciano Cutter, MD      Allergies    Sulfa antibiotics, Chantix [varenicline tartrate], Fish allergy, Hygroton [chlorthalidone], Penicillins, and Latex    Review of Systems   Review of Systems  All other systems reviewed and are negative.   Physical Exam Updated Vital Signs BP 137/77   Pulse 88   Temp 98.2 F (36.8 C) (Oral)   Resp 18   Ht 5\' 6"  (1.676 m)   Wt 117.5 kg   SpO2 98%   BMI 41.80 kg/m  Physical Exam Vitals and nursing note reviewed.  Constitutional:      Appearance: Krystal Allen is  well-developed.  HENT:     Head: Atraumatic.  Eyes:     Extraocular Movements: Extraocular movements intact.     Pupils: Pupils are equal, round, and reactive to light.     Comments: Bilateral chemosis of the conjunctiva noted.  Positive photophobia  Bedside finger counting appears to be normal.  After tetracaine and fluorescein evaluation, no dye uptake noted of the left pupil. Patient did not feel any better with tetracaine.  Left eye ocular pressures are 26, 28 and 40  Cardiovascular:     Rate and Rhythm: Normal rate.  Pulmonary:     Effort: Pulmonary effort is normal.  Musculoskeletal:     Cervical back: Normal range of motion and neck supple.  Skin:    General: Skin is warm and dry.  Neurological:     Mental Status: Krystal Allen is alert and oriented to person, place, and time.     ED Results / Procedures / Treatments   Labs (all labs ordered are listed, but only abnormal results are displayed) Labs Reviewed  CBC WITH DIFFERENTIAL/PLATELET - Abnormal; Notable for the following components:      Result Value   RDW 17.9 (*)    All other components within normal limits  I-STAT CHEM 8, ED    EKG None  Radiology No results found.  Procedures Procedures    Medications Ordered in  ED Medications  sodium chloride 0.9 % bolus 500 mL (500 mLs Intravenous New Bag/Given 02/26/23 1911)    ED Course/ Medical Decision Making/ A&P Clinical Course as of 02/26/23 2115  Sun Feb 26, 2023  2108 Received sign out from Dr. Rhunette Croft, presenting with facial swelling, pending CT face with contrast.  [WS]    Clinical Course User Index [WS] Lonell Grandchild, MD                             Medical Decision Making Amount and/or Complexity of Data Reviewed Labs: ordered. Radiology: ordered.  Risk Prescription drug management.   67 year old female comes in with chief complaint of left-sided facial swelling, left eye discomfort.  Patient has periorbital ecchymosis/edema and also left facial swelling.  Krystal Allen has history of tobacco use disorder, hypertension, hyperlipidemia.  Differential diagnosis considered for this patient includes parotitis, salivary gland stone, preorbital cellulitis, allergic reaction, conjunctivitis, corneal ulcer/abrasion, glaucoma.  Eye exam overall is reassuring.  Visual acuity screen ordered.  CT max face with contrast ordered.  Patient's care signed out to incoming team: Dispo per CT finding.   Final Clinical Impression(s) / ED Diagnoses Final diagnoses:  None    Rx / DC Orders ED Discharge Orders     None         Derwood Kaplan, MD 02/26/23 2128

## 2023-02-27 DIAGNOSIS — C966 Unifocal Langerhans-cell histiocytosis: Secondary | ICD-10-CM | POA: Diagnosis not present

## 2023-02-27 DIAGNOSIS — H11423 Conjunctival edema, bilateral: Secondary | ICD-10-CM | POA: Diagnosis not present

## 2023-02-27 DIAGNOSIS — M1990 Unspecified osteoarthritis, unspecified site: Secondary | ICD-10-CM | POA: Diagnosis present

## 2023-02-27 DIAGNOSIS — N1832 Chronic kidney disease, stage 3b: Secondary | ICD-10-CM | POA: Diagnosis not present

## 2023-02-27 DIAGNOSIS — Z882 Allergy status to sulfonamides status: Secondary | ICD-10-CM | POA: Diagnosis not present

## 2023-02-27 DIAGNOSIS — Z90710 Acquired absence of both cervix and uterus: Secondary | ICD-10-CM | POA: Diagnosis not present

## 2023-02-27 DIAGNOSIS — E1122 Type 2 diabetes mellitus with diabetic chronic kidney disease: Secondary | ICD-10-CM | POA: Diagnosis not present

## 2023-02-27 DIAGNOSIS — Z833 Family history of diabetes mellitus: Secondary | ICD-10-CM | POA: Diagnosis not present

## 2023-02-27 DIAGNOSIS — I152 Hypertension secondary to endocrine disorders: Secondary | ICD-10-CM | POA: Diagnosis not present

## 2023-02-27 DIAGNOSIS — F32A Depression, unspecified: Secondary | ICD-10-CM | POA: Diagnosis present

## 2023-02-27 DIAGNOSIS — E872 Acidosis, unspecified: Secondary | ICD-10-CM | POA: Diagnosis present

## 2023-02-27 DIAGNOSIS — I129 Hypertensive chronic kidney disease with stage 1 through stage 4 chronic kidney disease, or unspecified chronic kidney disease: Secondary | ICD-10-CM | POA: Diagnosis present

## 2023-02-27 DIAGNOSIS — Z888 Allergy status to other drugs, medicaments and biological substances status: Secondary | ICD-10-CM | POA: Diagnosis not present

## 2023-02-27 DIAGNOSIS — Z87891 Personal history of nicotine dependence: Secondary | ICD-10-CM | POA: Diagnosis not present

## 2023-02-27 DIAGNOSIS — E669 Obesity, unspecified: Secondary | ICD-10-CM | POA: Diagnosis not present

## 2023-02-27 DIAGNOSIS — K219 Gastro-esophageal reflux disease without esophagitis: Secondary | ICD-10-CM | POA: Diagnosis present

## 2023-02-27 DIAGNOSIS — Z8249 Family history of ischemic heart disease and other diseases of the circulatory system: Secondary | ICD-10-CM | POA: Diagnosis not present

## 2023-02-27 DIAGNOSIS — E1159 Type 2 diabetes mellitus with other circulatory complications: Secondary | ICD-10-CM | POA: Diagnosis not present

## 2023-02-27 DIAGNOSIS — E785 Hyperlipidemia, unspecified: Secondary | ICD-10-CM | POA: Diagnosis present

## 2023-02-27 DIAGNOSIS — J439 Emphysema, unspecified: Secondary | ICD-10-CM | POA: Diagnosis present

## 2023-02-27 DIAGNOSIS — X58XXXA Exposure to other specified factors, initial encounter: Secondary | ICD-10-CM | POA: Diagnosis present

## 2023-02-27 DIAGNOSIS — T783XXA Angioneurotic edema, initial encounter: Secondary | ICD-10-CM | POA: Diagnosis present

## 2023-02-27 DIAGNOSIS — I700 Atherosclerosis of aorta: Secondary | ICD-10-CM | POA: Diagnosis present

## 2023-02-27 DIAGNOSIS — J849 Interstitial pulmonary disease, unspecified: Secondary | ICD-10-CM | POA: Diagnosis present

## 2023-02-27 DIAGNOSIS — Z91040 Latex allergy status: Secondary | ICD-10-CM | POA: Diagnosis not present

## 2023-02-27 DIAGNOSIS — K1120 Sialoadenitis, unspecified: Secondary | ICD-10-CM | POA: Diagnosis present

## 2023-02-27 DIAGNOSIS — Z6841 Body Mass Index (BMI) 40.0 and over, adult: Secondary | ICD-10-CM | POA: Diagnosis not present

## 2023-02-27 DIAGNOSIS — R229 Localized swelling, mass and lump, unspecified: Secondary | ICD-10-CM | POA: Diagnosis present

## 2023-02-27 LAB — GLUCOSE, CAPILLARY
Glucose-Capillary: 154 mg/dL — ABNORMAL HIGH (ref 70–99)
Glucose-Capillary: 165 mg/dL — ABNORMAL HIGH (ref 70–99)
Glucose-Capillary: 197 mg/dL — ABNORMAL HIGH (ref 70–99)
Glucose-Capillary: 215 mg/dL — ABNORMAL HIGH (ref 70–99)

## 2023-02-27 LAB — BASIC METABOLIC PANEL
Anion gap: 14 (ref 5–15)
BUN: 13 mg/dL (ref 8–23)
CO2: 20 mmol/L — ABNORMAL LOW (ref 22–32)
Calcium: 8.9 mg/dL (ref 8.9–10.3)
Chloride: 102 mmol/L (ref 98–111)
Creatinine, Ser: 1.42 mg/dL — ABNORMAL HIGH (ref 0.44–1.00)
GFR, Estimated: 41 mL/min — ABNORMAL LOW (ref >60)
Glucose, Bld: 144 mg/dL — ABNORMAL HIGH (ref 70–99)
Potassium: 3.6 mmol/L (ref 3.5–5.1)
Sodium: 136 mmol/L (ref 135–145)

## 2023-02-27 LAB — HEMOGLOBIN A1C
Hgb A1c MFr Bld: 6.8 % — ABNORMAL HIGH (ref 4.8–5.6)
Mean Plasma Glucose: 148.46 mg/dL

## 2023-02-27 LAB — HIV ANTIBODY (ROUTINE TESTING W REFLEX): HIV Screen 4th Generation wRfx: NONREACTIVE

## 2023-02-27 LAB — PROCALCITONIN: Procalcitonin: <0.1 ng/mL

## 2023-02-27 MED ORDER — ASPIRIN 81 MG PO TBEC
81.0000 mg | DELAYED_RELEASE_TABLET | Freq: Every day | ORAL | Status: DC
  Administered 2023-02-27 – 2023-02-28 (×2): 81 mg via ORAL
  Filled 2023-02-27 (×2): qty 1

## 2023-02-27 MED ORDER — INSULIN ASPART 100 UNIT/ML IJ SOLN
0.0000 [IU] | Freq: Three times a day (TID) | INTRAMUSCULAR | Status: DC
  Administered 2023-02-28: 4 [IU] via SUBCUTANEOUS

## 2023-02-27 MED ORDER — FAMOTIDINE IN NACL 20-0.9 MG/50ML-% IV SOLN
20.0000 mg | INTRAVENOUS | Status: DC
  Administered 2023-02-27 – 2023-02-28 (×2): 20 mg via INTRAVENOUS
  Filled 2023-02-27 (×2): qty 50

## 2023-02-27 MED ORDER — HEPARIN SODIUM (PORCINE) 5000 UNIT/ML IJ SOLN
5000.0000 [IU] | Freq: Three times a day (TID) | INTRAMUSCULAR | Status: DC
  Administered 2023-02-27 – 2023-02-28 (×4): 5000 [IU] via SUBCUTANEOUS
  Filled 2023-02-27 (×4): qty 1

## 2023-02-27 MED ORDER — DIPHENHYDRAMINE HCL 50 MG/ML IJ SOLN
25.0000 mg | Freq: Four times a day (QID) | INTRAMUSCULAR | Status: DC
  Administered 2023-02-27 – 2023-02-28 (×5): 25 mg via INTRAVENOUS
  Filled 2023-02-27 (×4): qty 1

## 2023-02-27 MED ORDER — OXYBUTYNIN CHLORIDE ER 5 MG PO TB24
5.0000 mg | ORAL_TABLET | Freq: Every day | ORAL | Status: DC
  Administered 2023-02-27: 5 mg via ORAL
  Filled 2023-02-27 (×2): qty 1

## 2023-02-27 MED ORDER — INSULIN ASPART 100 UNIT/ML IJ SOLN
0.0000 [IU] | Freq: Three times a day (TID) | INTRAMUSCULAR | Status: DC
  Administered 2023-02-27: 1 [IU] via SUBCUTANEOUS
  Administered 2023-02-27: 2 [IU] via SUBCUTANEOUS

## 2023-02-27 MED ORDER — ACETAMINOPHEN 650 MG RE SUPP: 650.0000 mg | Freq: Four times a day (QID) | RECTAL | Status: DC | PRN

## 2023-02-27 MED ORDER — MORPHINE SULFATE (PF) 2 MG/ML IV SOLN
2.0000 mg | INTRAVENOUS | Status: DC | PRN
  Administered 2023-02-27 (×2): 2 mg via INTRAVENOUS
  Filled 2023-02-27 (×2): qty 1

## 2023-02-27 MED ORDER — DEXAMETHASONE 4 MG PO TABS
6.0000 mg | ORAL_TABLET | Freq: Every day | ORAL | Status: DC
  Administered 2023-02-27 – 2023-02-28 (×2): 6 mg via ORAL
  Filled 2023-02-27 (×2): qty 2

## 2023-02-27 MED ORDER — OXYCODONE HCL 5 MG PO TABS: 5.0000 mg | ORAL_TABLET | ORAL | Status: DC | PRN

## 2023-02-27 MED ORDER — GUAIFENESIN 100 MG/5ML PO LIQD
5.0000 mL | ORAL | Status: DC | PRN
  Administered 2023-02-27: 5 mL via ORAL
  Filled 2023-02-27: qty 5

## 2023-02-27 MED ORDER — ONDANSETRON HCL 4 MG PO TABS: 4.0000 mg | ORAL_TABLET | Freq: Four times a day (QID) | ORAL | Status: DC | PRN

## 2023-02-27 MED ORDER — HYDRALAZINE HCL 10 MG PO TABS
10.0000 mg | ORAL_TABLET | Freq: Two times a day (BID) | ORAL | Status: DC
  Administered 2023-02-27 – 2023-02-28 (×3): 10 mg via ORAL
  Filled 2023-02-27 (×5): qty 1

## 2023-02-27 MED ORDER — LABETALOL HCL 5 MG/ML IV SOLN: 10.0000 mg | INTRAVENOUS | Status: DC | PRN

## 2023-02-27 MED ORDER — ACETAMINOPHEN 325 MG PO TABS
650.0000 mg | ORAL_TABLET | Freq: Four times a day (QID) | ORAL | Status: DC | PRN
  Administered 2023-02-27: 650 mg via ORAL
  Filled 2023-02-27: qty 2

## 2023-02-27 MED ORDER — ATORVASTATIN CALCIUM 80 MG PO TABS
80.0000 mg | ORAL_TABLET | Freq: Every day | ORAL | Status: DC
  Administered 2023-02-27 – 2023-02-28 (×2): 80 mg via ORAL
  Filled 2023-02-27 (×2): qty 1

## 2023-02-27 MED ORDER — DEXAMETHASONE SODIUM PHOSPHATE 10 MG/ML IJ SOLN
10.0000 mg | Freq: Once | INTRAMUSCULAR | Status: AC
  Administered 2023-02-27: 10 mg via INTRAVENOUS
  Filled 2023-02-27: qty 1

## 2023-02-27 MED ORDER — INSULIN ASPART 100 UNIT/ML IJ SOLN
0.0000 [IU] | Freq: Every day | INTRAMUSCULAR | Status: DC
  Administered 2023-02-28: 2 [IU] via SUBCUTANEOUS

## 2023-02-27 MED ORDER — ONDANSETRON HCL 4 MG/2ML IJ SOLN
4.0000 mg | Freq: Four times a day (QID) | INTRAMUSCULAR | Status: DC | PRN
  Administered 2023-02-27: 4 mg via INTRAVENOUS
  Filled 2023-02-27: qty 2

## 2023-02-27 MED ORDER — POTASSIUM CHLORIDE 10 MEQ/100ML IV SOLN
10.0000 meq | INTRAVENOUS | Status: AC
  Administered 2023-02-27: 10 meq via INTRAVENOUS
  Filled 2023-02-27: qty 100

## 2023-02-27 NOTE — Assessment & Plan Note (Signed)
Recent diagnosis per dtr.

## 2023-02-27 NOTE — Subjective & Objective (Addendum)
CC: eye swelling HPI: 67 year old African-American female history of obesity BMI 41.8, recent diagnosis of diabetes, hypertension, CKD stage IIIb, Langerhans cell histiocytosis/interstitial lung disease, unspecified idiopathic urticaria/angioedema presents to the ER today with a onset of eye swelling that started this morning.  She went to bed yesterday without difficulty.  Her eyes were normal.  When she woke today, she had swelling of both her eyes, left greater than right.  She has not had any fevers.  On arrival temp 90.2 heart rate 105 blood pressure 147/78 satting 94% on room air.  White count 6.5, hemoglobin 14.0, platelet 281  Sodium 142, potassium 3.2, chloride of 104, BUN of 14, creatinine 1.4  CT maxillofacial showed hazy inflammatory stranding of the left preseptal periorbital tissues.  There is some increased attenuation in the left parotid gland consistent with parotitis.  No stones.  EDP performed ocular pressures which were 26 and 28.  Fluorescein examinations were done which showed no uptake in the left pupil.  [facility] hospitalist contacted for admission.

## 2023-02-27 NOTE — ED Provider Notes (Signed)
  Physical Exam  BP (!) 149/78 (BP Location: Right Arm)   Pulse 83   Temp 97.8 F (36.6 C) (Oral)   Resp (!) 25   Ht 5\' 6"  (1.676 m)   Wt 117.5 kg   SpO2 95%   BMI 41.80 kg/m   Physical Exam Vitals and nursing note reviewed.  Constitutional:      General: She is not in acute distress.    Appearance: She is well-developed.  HENT:     Head: Normocephalic and atraumatic.     Mouth/Throat:     Mouth: Mucous membranes are moist.  Eyes:     Comments: Conjunctiva injected bilaterally. Left periorbital swelling, warmth and tenderness  Cardiovascular:     Rate and Rhythm: Normal rate and regular rhythm.     Heart sounds: No murmur heard. Pulmonary:     Effort: Pulmonary effort is normal. No respiratory distress.     Breath sounds: Normal breath sounds.  Abdominal:     General: Abdomen is flat.     Palpations: Abdomen is soft.     Tenderness: There is no abdominal tenderness.  Musculoskeletal:        General: No tenderness.     Right lower leg: No edema.     Left lower leg: No edema.  Skin:    General: Skin is warm and dry.  Neurological:     General: No focal deficit present.     Mental Status: She is alert. Mental status is at baseline.  Psychiatric:        Mood and Affect: Mood normal.        Behavior: Behavior normal.     Procedures  Procedures  ED Course / MDM   Clinical Course as of 02/27/23 0018  Sun Feb 26, 2023  2108 Received sign out from Dr. Rhunette Croft, presenting with facial swelling, pending CT face with contrast.  [WS]  2244 CT face shows signs of periorbital cellulitis without orbital involvement.  Given eye pain as well, recommend follow-up with ophthalmology as well as primary doctor. [WS]  Mon Feb 27, 2023  0016 Patient having persistent pain despite multiple doses of oxycodone.  She does appear very uncomfortable.  Discussed with Dr. Imogene Burn who will assess patient anticipate admission given how uncomfortable the patient is.  There also may be an allergy  component, will trial Benadryl. [WS]    Clinical Course User Index [WS] Lonell Grandchild, MD   Medical Decision Making Amount and/or Complexity of Data Reviewed Labs: ordered. Radiology: ordered.  Risk Prescription drug management. Decision regarding hospitalization.          Lonell Grandchild, MD 02/27/23 [phone]

## 2023-02-27 NOTE — H&P (Addendum)
History and Physical    MELINNA LINAREZ ZOX:[id number] DOB: [birth year] DOA: 02/26/2023  DOS: the patient was seen and examined on 02/26/2023  PCP: Marcine Matar, MD   Patient coming from: Home  I have personally briefly reviewed patient's old medical records in [facility]  CC: eye swelling HPI: 67 year old African-American female history of obesity BMI 41.8, recent diagnosis of diabetes, hypertension, CKD stage IIIb, Langerhans cell histiocytosis/interstitial lung disease, unspecified idiopathic urticaria/angioedema presents to the ER today with a onset of eye swelling that started this morning.  She went to bed yesterday without difficulty.  Her eyes were normal.  When she woke today, she had swelling of both her eyes, left greater than right.  She has not had any fevers.  On arrival temp 90.2 heart rate 105 blood pressure 147/78 satting 94% on room air.  White count 6.5, hemoglobin 14.0, platelet 281  Sodium 142, potassium 3.2, chloride of 104, BUN of 14, creatinine 1.4  CT maxillofacial showed hazy inflammatory stranding of the left preseptal periorbital tissues.  There is some increased attenuation in the left parotid gland consistent with parotitis.  No stones.  EDP performed ocular pressures which were 26 and 28.  Fluorescein examinations were done which showed no uptake in the left pupil.  [facility] contacted for admission.   ED Course: ocular pressure normal, WBC 6.5  Review of Systems:  Review of Systems  Constitutional: Negative.  Negative for chills and fever.  HENT: Negative.    Eyes:  Positive for pain, discharge and redness.  Respiratory: Negative.    Cardiovascular: Negative.   Gastrointestinal: Negative.   Genitourinary: Negative.   Musculoskeletal: Negative.   Skin: Negative.   Neurological: Negative.   Endo/Heme/Allergies: Negative.   Psychiatric/Behavioral: Negative.    All other systems reviewed and are negative.   Past Medical  History:  Diagnosis Date   Allergy    COPD (chronic obstructive pulmonary disease) (HCC)    Depression    GERD (gastroesophageal reflux disease)    Hernia, abdominal    History of chicken pox    Hyperlipidemia    Hypertension    Internal hemorrhoids with Grade 3 prolapse and bleeding 06/19/2013   Osteoarthritis    Personal history of colonic adenomas 06/25/2013    Past Surgical History:  Procedure Laterality Date   ABDOMINAL HYSTERECTOMY  51yrs ago   due to heavy bleeding and fibroids    COLONOSCOPY     HEMORRHOID BANDING  2014   HEMORRHOID SURGERY  35yrs agao   RIGHT HEART CATH N/A 10/12/2022   Procedure: RIGHT HEART CATH;  Surgeon: Orbie Pyo, MD;  Location: [facility];  Service: Cardiovascular;  Laterality: N/A;   UMBILICAL HERNIA REPAIR  06/2015     reports that she quit smoking about 18 months ago. Her smoking use included cigarettes. She has a 20.00 pack-year smoking history. She has never been exposed to tobacco smoke. She has never used smokeless tobacco. She reports current alcohol use. She reports that she does not use drugs.  Allergies  Allergen Reactions   Sulfa Antibiotics Anaphylaxis and Hives   Chantix [Varenicline Tartrate]     Bad dreams   Fish Allergy Other (See Comments)    Severe stomach cramps    Hygroton [Chlorthalidone]    Penicillins Other (See Comments)    Pt took Amoxicillin in 2019 without issue, reports PCN caused rash in the past   Latex Rash and Other (See Comments)    Dry skin  Family History  Problem Relation Age of Onset   Cancer Father        prostate   Hypertension Mother    Diabetes Mother    Hypertension Sister    Heart disease Maternal Uncle    Stroke Maternal Grandmother    Hypertension Maternal Grandmother    Hypertension Sister    Colon cancer Neg Hx    Stomach cancer Neg Hx    Rectal cancer Neg Hx    Colon polyps Neg Hx     Prior to Admission medications   Medication Sig Start Date End Date Taking?  Authorizing Provider  Accu-Chek Softclix Lancets lancets Use to check blood sugar three times daily. E11.69 02/24/23   Marcine Matar, MD  acetaminophen (TYLENOL 8 HOUR) 650 MG CR tablet Take 1 tablet (650 mg total) by mouth 2 (two) times daily as needed for pain. 04/06/17   Funches, Gerilyn Nestle, MD  albuterol (PROAIR HFA) 108 (90 Base) MCG/ACT inhaler Inhale 2 puffs into the lungs every 4 (four) hours as needed for wheezing or shortness of breath. 02/15/23   Marcine Matar, MD  albuterol (PROVENTIL) (2.5 MG/3ML) 0.083% nebulizer solution TAKE 3 MLS BY NEBULIZATION EVERY 6 (SIX) HOURS AS NEEDED FOR WHEEZING OR SHORTNESS OF BREATH. 10/03/22   Marcine Matar, MD  aspirin EC 81 MG tablet Take 1 tablet (81 mg total) by mouth daily. 05/12/20   Marcine Matar, MD  atorvastatin (LIPITOR) 80 MG tablet Take 1 tablet (80 mg total) by mouth daily. 05/11/22   Orbie Pyo, MD  Blood Glucose Monitoring Suppl (ACCU-CHEK GUIDE) w/Device KIT Use to check blood sugar three times daily. E11.69 02/24/23   Marcine Matar, MD  cetirizine (ZYRTEC) 10 MG tablet Take 1 tablet (10 mg total) by mouth daily. 02/07/22   Mayers, Cari S, PA-C  Continuous Glucose Receiver (FREESTYLE LIBRE 2 READER) DEVI Use to check blood sugar continuously throughout the day. Change sensors once every 14 days. 02/20/23   Marcine Matar, MD  Continuous Glucose Sensor (FREESTYLE LIBRE 2 SENSOR) MISC Use to check blood sugar continuously throughout the day. 02/20/23   Marcine Matar, MD  diphenhydrAMINE (BENADRYL) 25 mg capsule Take 25-50 mg by mouth every 6 (six) hours as needed for itching or allergies.    [provider]  doxycycline (VIBRAMYCIN) 100 MG capsule Take 1 capsule (100 mg total) by mouth 2 (two) times daily. 01/29/23   Elson Areas, PA-C  Elastic Bandages & Supports (MEDICAL COMPRESSION STOCKINGS) MISC R60.0 01/25/22   Claiborne Rigg, NP  EPINEPHrine 0.3 mg/0.3 mL IJ SOAJ injection Inject 0.3 mg into the  muscle as needed for anaphylaxis. 10/26/22   Marcine Matar, MD  Evolocumab (REPATHA SURECLICK) 140 MG/ML SOAJ Inject 140 mg into the skin every 14 (fourteen) days. 10/06/22   Hilty, Lisette Abu, MD  ezetimibe (ZETIA) 10 MG tablet Take 1 tablet (10 mg total) by mouth daily. 10/03/22   Orbie Pyo, MD  famotidine (PEPCID) 20 MG tablet Take 1 tablet (20 mg total) by mouth 2 (two) times daily as needed (hives). 12/02/22   Birder Robson, MD  fexofenadine [facility] ALLERGY) 180 MG tablet Take 1 tablet (180 mg total) by mouth 2 (two) times daily as needed (hives). 12/02/22   Birder Robson, MD  fluticasone (FLONASE) 50 MCG/ACT nasal spray Place 2 sprays into both nostrils daily. 12/02/22   Birder Robson, MD  fluticasone furoate-vilanterol (BREO ELLIPTA) 200-25 MCG/ACT AEPB Inhale 1  puff into the lungs once daily. 11/03/22   Luciano Cutter, MD  furosemide (LASIX) 20 MG tablet Take 1 tablet (20 mg total) by mouth daily as needed (for weight gain of 2-3 lbs overnight or swelling in legs/ankles). 02/23/23   Alver Sorrow, NP  glucose blood (ACCU-CHEK GUIDE) test strip Use to check blood sugar three times daily. E11.69 02/24/23   Marcine Matar, MD  hydrALAZINE (APRESOLINE) 10 MG tablet Take 1 tablet (10 mg total) by mouth 2 (two) times daily. 02/01/23   Marcine Matar, MD  hydrochlorothiazide (HYDRODIURIL) 25 MG tablet Take 1 tablet (25 mg total) by mouth daily. 02/01/23   Marcine Matar, MD  hydrOXYzine (ATARAX) 25 MG tablet Take 1 tablet (25 mg total) by mouth every 8 (eight) hours as needed for up to 15 doses for itching. 08/10/22   Terald Sleeper, MD  Olopatadine HCl 0.2 % SOLN Place 1 drop into both eyes daily as needed (allergies). 12/02/22   Birder Robson, MD  omeprazole (PRILOSEC) 20 MG capsule Take 1 capsule (20 mg total) by mouth daily. 09/30/22   Marcine Matar, MD  oxybutynin (DITROPAN XL) 5 MG 24 hr tablet Take 1 tablet (5 mg total) by mouth at bedtime. 02/02/23   Freddy Finner, NP   phentermine 37.5 MG capsule Take 1 capsule (37.5 mg total) by mouth every morning. 12/31/22   Marcine Matar, MD  potassium chloride SA (KLOR-CON M) 20 MEQ tablet Take 2 tablets ( total) by mouth every morning and 1 tablet ( total) every evening for the next 3 days (02/24/23, 02/25/23, and 02/26/23). Starting on 02/27/23 take 1 tablet by mouth twice daily. 02/23/23   Alver Sorrow, NP  Respiratory Therapy Supplies (FLUTTER) DEVI Use as directed 12/01/17   Chilton Greathouse, MD  Semaglutide,0.25 or 0.5MG /DOS, 2 MG/3ML SOPN Inject 0.25 mg into the skin once a week. 02/20/23   Marcine Matar, MD  sodium chloride (OCEAN) 0.65 % nasal spray Place 2 sprays into both nostrils every 4 (four) hours. Patient taking differently: Place 2 sprays into the nose every 4 (four) hours as needed for congestion. 08/20/21   Gwenevere Abbot, MD  Tiotropium Bromide Monohydrate (SPIRIVA RESPIMAT) 2.5 MCG/ACT AERS Inhale 2 puffs into the lungs daily. 11/03/22   Luciano Cutter, MD    Physical Exam: Vitals:   02/26/23 2230 02/27/23 0015 02/27/23 0016 02/27/23 0022  BP: (!) 120/55 (!) 149/78 (!) 149/78   Pulse: 82 83  76  Resp: (!) 25 (!) 25  13  Temp:  97.8 F (36.6 C)    TempSrc:  Oral    SpO2: 98% 95%  95%  Weight:      Height:        Physical Exam Vitals and nursing note reviewed.  Constitutional:      General: She is not in acute distress.    Appearance: She is not toxic-appearing.  HENT:     Head: Normocephalic and atraumatic.     Salivary Glands: Left salivary gland is tender.   Eyes:     General:        Right eye: Discharge present.        Left eye: Discharge present.    Conjunctiva/sclera:     Right eye: Right conjunctiva is injected. Chemosis present.     Left eye: Left conjunctiva is injected. Chemosis present.     Comments: See picture Edema left upper eyelid  Cardiovascular:     Rate  and Rhythm: Normal rate and regular rhythm.  Pulmonary:     Effort: Pulmonary effort is  normal.  Abdominal:     General: Abdomen is protuberant. Bowel sounds are normal. There is no distension.     Tenderness: There is no abdominal tenderness.  Skin:    General: Skin is warm and dry.     Capillary Refill: Capillary refill takes less than 2 seconds.  Neurological:     Mental Status: She is oriented to person, place, and time.             Labs on Admission: I have personally reviewed following labs and imaging studies  CBC: Recent Labs  Lab 02/24/23 1522 02/26/23 1849 02/26/23 2033  WBC 5.6 6.5  --   NEUTROABS 2.6 4.2  --   HGB 13.6 14.0 13.9  HCT 42.3 43.6 41.0  MCV 89.2 88.8  --   PLT 293 281  --    Basic Metabolic Panel: Recent Labs  Lab 02/22/23 1012 02/24/23 1522 02/26/23 2033  NA 141 139 142  K 3.2* 3.0* 3.2*  CL 98 102 104  CO2 26 29  --   GLUCOSE 143* 109* 107*  BUN 21 18 14   CREATININE 1.50* 1.46* 1.40*  CALCIUM 9.1 8.8*  --    GFR: Estimated Creatinine Clearance: 50.8 mL/min (A) (by C-G formula based on SCr of 1.4 mg/dL (H)). Liver Function Tests: Recent Labs  Lab 02/24/23 1522  AST 21  ALT 17  ALKPHOS 131*  BILITOT 0.8  PROT 7.1  ALBUMIN 3.1*    Radiological Exams on Admission: I have personally reviewed images DG Chest 2 View  Result Date: 02/26/2023 CLINICAL DATA:  Left-sided facial swelling and possible mass, initial encounter EXAM: CHEST - 2 VIEW COMPARISON:  08/10/2022 FINDINGS: Cardiac shadow is mildly prominent but stable from the prior exam. Lungs are well aerated bilaterally. No focal infiltrate or sizable effusion is seen. No acute bony abnormality is noted. IMPRESSION: No acute abnormality noted. Electronically Signed   By: Alcide Clever M.D.   On: 02/26/2023 22:05   CT Maxillofacial W Contrast  Result Date: 02/26/2023 CLINICAL DATA:  Initial evaluation for acute left-sided facial swelling. EXAM: CT MAXILLOFACIAL WITH CONTRAST TECHNIQUE: Multidetector CT imaging of the maxillofacial structures was performed with  intravenous contrast. Multiplanar CT image reconstructions were also generated. RADIATION DOSE REDUCTION: This exam was performed according to the departmental dose-optimization program which includes automated exposure control, adjustment of the mA and/or kV according to patient size and/or use of iterative reconstruction technique. CONTRAST:  50mL OMNIPAQUE IOHEXOL 350 MG/ML SOLN COMPARISON:  None Available. FINDINGS: Osseous: No acute osseous finding.  No worrisome osseous lesions. Orbits: Soft tissue swelling seen involving the left preseptal periorbital soft tissues, concerning for acute periorbital cellulitis. No postseptal or intraorbital extension. Globes and orbital soft tissues otherwise unremarkable. Sinuses: Paranasal sinuses are largely clear. Mastoid air cells and middle ear cavities are well pneumatized and free of fluid. Soft tissues: Soft tissue swelling with hazy inflammatory stranding about the left preseptal periorbital soft tissues. Swelling extends inferiorly to involve the left face. Minimally increased attenuation within the left parotid gland as compared to the right, suggesting a degree of concomitant parotitis. No obstructive stone within Stensen's duct. No discrete abscess or drainable fluid collection. Limited intracranial: Unremarkable. IMPRESSION: 1. Soft tissue swelling with hazy inflammatory stranding about the left preseptal periorbital soft tissues, concerning for acute periorbital cellulitis. No postseptal or intraorbital extension. 2. Minimally increased attenuation within the left parotid  gland as compared to the right, suggesting a degree of concomitant parotitis. No obstructive stone within Stensen's duct. No discrete abscess or drainable fluid collection. Electronically Signed   By: Rise Mu M.D.   On: 02/26/2023 21:59    EKG: My personal interpretation of EKG shows: no EKG  Assessment/Plan Principal Problem:   Chemosis of conjunctiva, bilateral Active  Problems:   Hypertension associated with type 2 diabetes mellitus (HCC)   Obesity (BMI 30-39.9)   Type 2 diabetes mellitus with chronic kidney disease, without long-term current use of insulin (HCC)   CKD stage 3b, GFR 30-44 ml/min (HCC) - Baseline Scr 1.5   Langerhan's cell histiocytosis (HCC)    Assessment and Plan: * Chemosis of conjunctiva, bilateral Observation med/surg bed. Unclear the cause of her acute allergic reactions. Has had skin testing by allergy. Show allergy to mold, maple tree, mites, fish. Doubt she has cellulitis of her left eye despite CT report. She certainly has edema of left upper eyelid. But dtr states pt's acute chemosis of both eye started this AM. Went to bed last night feeling and looking ok. Dtr states pt has had multiple episodes of sudden swelling of her lips/tongue. Today, she has not had any upper airway swelling.  She probably needs referral to [facility] for further workup. Will give IV decadron. I Doubt she has shingles as both conjunctiva are swollen/erythematous.. will continue with IV benadryl and IV pepcid.  CKD stage 3b, GFR 30-44 ml/min (HCC) - Baseline Scr 1.5 Chronic. Baseline Scr 1.5  Type 2 diabetes mellitus with chronic kidney disease, without long-term current use of insulin (HCC) Recent diagnosis per dtr.  Obesity (BMI 30-39.9) Chronic. BMI 41.8  Hypertension associated with type 2 diabetes mellitus (HCC) Stable. Continue hydralazine. Hold hctz/lasix for now.  Langerhan's cell histiocytosis (HCC) Uses nightly O2 prn.   DVT prophylaxis: SQ Heparin Code Status: Full Code Family Communication: discussed with pt and dtr kirra at bedside Disposition Plan: return home  Consults called: none  Admission status: Observation, Med-Surg   Carollee Herter, DO [facility] 02/27/2023, 12:48 AM

## 2023-02-27 NOTE — ED Notes (Signed)
Pt refused PO medications.pt states she is in to much pain to take them

## 2023-02-27 NOTE — Assessment & Plan Note (Signed)
Stable. Continue hydralazine. Hold hctz/lasix for now.

## 2023-02-27 NOTE — Assessment & Plan Note (Signed)
Chronic. BMI 41.8 

## 2023-02-27 NOTE — ED Notes (Signed)
Dr.Chen admitting doc at the bedside

## 2023-02-27 NOTE — Hospital Course (Addendum)
67 year old female w/ history of obesity BMI 41.8, recent diagnosis of diabetes on semaglutide/HLD-on Lipitor/Hypertension on HCTZ, PRN Lasix, hydralazine, CKD stage IIIb, Langerhans cell histiocytosis/interstitial lung disease, unspecified idiopathic urticaria/angioedema presented with onset of swelling that is started in the morning. She went to bed 4/28 without difficulty.  Her eyes were normal.  When she woke 4/29 am she had swelling of both her eyes, left greater than right.  She has not had any fevers.   In ED Temp:90.2 heart rate 105 blood pressure 147/78 satting 94% on room air.White count 6.5, hemoglobin 14.0, platelet 281 Sodium 142, potassium 3.2, chloride of 104, BUN of 14, creatinine 1.4 CT maxillofacial showed hazy inflammatory stranding of the left preseptal periorbital tissues. There is some increased attenuation in the left parotid gland consistent with parotitis.  No stones. EDP performed ocular pressures which were 26 and 28. Fluorescein examinations were done which showed no uptake in the left pupil.  Initially received empiric vancomycin and ceftriaxone. Given patient's chronic urticaria and given the relatively sudden onset of presentation felt to have allergic reaction of unknown etiology, less likely infectious etiology/periorbital cellulitis especially with no leukocytosis and fever,  was having persistent pain despite oxycodone  She was admitted for further management, procalcitonin ordered started on steroid Felt she will need referral to [facility] or [facility] allergy.

## 2023-02-27 NOTE — ED Notes (Signed)
ED TO INPATIENT HANDOFF REPORT  ED Nurse Name and Phone #: Porfirio Mylar [phone]   S Name/Age/Gender Krystal Allen 67 y.o. female Room/Bed: 010C/010C  Code Status   Code Status: Full Code  Home/SNF/Other Home Patient oriented to: self, place, time, and situation Is this baseline? Yes   Triage Complete: Triage complete  Chief Complaint Chemosis of conjunctiva, bilateral [H11.423]  Triage Note PT states woke up with am with a knot over her L eye.  Now, both eyes are red, L eye and ear are red and swollen with hives on her L forehead.  Airway patent.  Pt states she has taken 1 tablet of benadryl at 1530 and an allergy pill with no relief.  She tried to give herself an epi pen, but she does not think the medication made it into her body.   Allergies Allergies  Allergen Reactions   Sulfa Antibiotics Anaphylaxis and Hives   Chantix [Varenicline Tartrate]     Bad dreams   Fish Allergy Other (See Comments)    Severe stomach cramps    Hygroton [Chlorthalidone]     Swelling or hives   Penicillins Other (See Comments)    Pt took Amoxicillin in 2019 without issue, reports PCN caused rash in the past   Latex Rash and Other (See Comments)    Dry skin    Level of Care/Admitting Diagnosis ED Disposition     ED Disposition  Admit   Condition  --   Comment  Hospital Area: [facility] [[id number]]  Level of Care: Med-Surg [16]  May place patient in observation at [facility] or [facility] if equivalent level of care is available:: No  Covid Evaluation: Asymptomatic - no recent exposure (last 10 days) testing not required  Diagnosis: Chemosis of conjunctiva, bilateral [[id number]]  Admitting Physician: Imogene Burn, ERIC [[id number]]  Attending Physician: Imogene Burn, ERIC [[id number]]          B Medical/Surgery History Past Medical History:  Diagnosis Date   Allergy    COPD (chronic obstructive pulmonary disease) (HCC)    Depression    GERD (gastroesophageal reflux disease)    Hernia,  abdominal    History of chicken pox    Hyperlipidemia    Hypertension    Internal hemorrhoids with Grade 3 prolapse and bleeding 06/19/2013   Osteoarthritis    Personal history of colonic adenomas 06/25/2013   Past Surgical History:  Procedure Laterality Date   ABDOMINAL HYSTERECTOMY  3yrs ago   due to heavy bleeding and fibroids    COLONOSCOPY     HEMORRHOID BANDING  2014   HEMORRHOID SURGERY  37yrs agao   RIGHT HEART CATH N/A 10/12/2022   Procedure: RIGHT HEART CATH;  Surgeon: Orbie Pyo, MD;  Location: [facility] INVASIVE [facility] LAB;  Service: Cardiovascular;  Laterality: N/A;   UMBILICAL HERNIA REPAIR  06/2015     A IV Location/Drains/Wounds Patient Lines/Drains/Airways Status     Active Line/Drains/Airways     Name Placement date Placement time Site Days   Peripheral IV 02/26/23 22 G 1" Anterior;Left Hand 02/26/23  1911  Hand  1   Peripheral IV 02/27/23 20 G 1" Anterior;Proximal;Right;Medial Forearm 02/27/23  0007  Forearm  less than 1            Intake/Output Last 24 hours  Intake/Output Summary (Last 24 hours) at 02/27/2023 0745 Last data filed at 02/27/2023 0344 Gross per 24 hour  Intake 1003.13 ml  Output --  Net 1003.13 ml    Labs/Imaging  Results for orders placed or performed during the hospital encounter of 02/26/23 (from the past 48 hour(s))  CBC with Differential     Status: Abnormal   Collection Time: 02/26/23  6:49 PM  Result Value Ref Range   WBC 6.5 4.0 - 10.5 K/uL   RBC 4.91 3.87 - 5.11 MIL/uL   Hemoglobin 14.0 12.0 - 15.0 g/dL   HCT 16.1 09.6 - 04.5 %   MCV 88.8 80.0 - 100.0 fL   MCH 28.5 26.0 - 34.0 pg   MCHC 32.1 30.0 - 36.0 g/dL   RDW 40.9 (H) 81.1 - 91.4 %   Platelets 281 150 - 400 K/uL   nRBC 0.0 0.0 - 0.2 %   Neutrophils Relative % 63 %   Neutro Abs 4.2 1.7 - 7.7 K/uL   Lymphocytes Relative 30 %   Lymphs Abs 1.9 0.7 - 4.0 K/uL   Monocytes Relative 5 %   Monocytes Absolute 0.3 0.1 - 1.0 K/uL   Eosinophils Relative 1 %   Eosinophils  Absolute 0.0 0.0 - 0.5 K/uL   Basophils Relative 1 %   Basophils Absolute 0.0 0.0 - 0.1 K/uL   Immature Granulocytes 0 %   Abs Immature Granulocytes 0.02 0.00 - 0.07 K/uL    Comment: Performed at [facility], [address] [place], [place] [address]  I-stat chem 8, ED (not at [facility], [facility] or [facility])     Status: Abnormal   Collection Time: 02/26/23  8:33 PM  Result Value Ref Range   Sodium 142 135 - 145 mmol/L   Potassium 3.2 (L) 3.5 - 5.1 mmol/L   Chloride 104 98 - 111 mmol/L   BUN 14 8 - 23 mg/dL   Creatinine, Ser 6.21 (H) 0.44 - 1.00 mg/dL   Glucose, Bld 308 (H) 70 - 99 mg/dL    Comment: Glucose reference range applies only to samples taken after fasting for at least 8 hours.   Calcium, Ion 1.14 (L) 1.15 - 1.40 mmol/L   TCO2 23 22 - 32 mmol/L   Hemoglobin 13.9 12.0 - 15.0 g/dL   HCT 65.7 84.6 - 96.2 %   DG Chest 2 View  Result Date: 02/26/2023 CLINICAL DATA:  Left-sided facial swelling and possible mass, initial encounter EXAM: CHEST - 2 VIEW COMPARISON:  08/10/2022 FINDINGS: Cardiac shadow is mildly prominent but stable from the prior exam. Lungs are well aerated bilaterally. No focal infiltrate or sizable effusion is seen. No acute bony abnormality is noted. IMPRESSION: No acute abnormality noted. Electronically Signed   By: Alcide Clever M.D.   On: 02/26/2023 22:05   CT Maxillofacial W Contrast  Result Date: 02/26/2023 CLINICAL DATA:  Initial evaluation for acute left-sided facial swelling. EXAM: CT MAXILLOFACIAL WITH CONTRAST TECHNIQUE: Multidetector CT imaging of the maxillofacial structures was performed with intravenous contrast. Multiplanar CT image reconstructions were also generated. RADIATION DOSE REDUCTION: This exam was performed according to the departmental dose-optimization program which includes automated exposure control, adjustment of the mA and/or kV according to patient size and/or use of iterative reconstruction technique. CONTRAST:  50mL OMNIPAQUE IOHEXOL 350  MG/ML SOLN COMPARISON:  None Available. FINDINGS: Osseous: No acute osseous finding.  No worrisome osseous lesions. Orbits: Soft tissue swelling seen involving the left preseptal periorbital soft tissues, concerning for acute periorbital cellulitis. No postseptal or intraorbital extension. Globes and orbital soft tissues otherwise unremarkable. Sinuses: Paranasal sinuses are largely clear. Mastoid air cells and middle ear cavities are well pneumatized and free of fluid. Soft tissues:  Soft tissue swelling with hazy inflammatory stranding about the left preseptal periorbital soft tissues. Swelling extends inferiorly to involve the left face. Minimally increased attenuation within the left parotid gland as compared to the right, suggesting a degree of concomitant parotitis. No obstructive stone within Stensen's duct. No discrete abscess or drainable fluid collection. Limited intracranial: Unremarkable. IMPRESSION: 1. Soft tissue swelling with hazy inflammatory stranding about the left preseptal periorbital soft tissues, concerning for acute periorbital cellulitis. No postseptal or intraorbital extension. 2. Minimally increased attenuation within the left parotid gland as compared to the right, suggesting a degree of concomitant parotitis. No obstructive stone within Stensen's duct. No discrete abscess or drainable fluid collection. Electronically Signed   By: Rise Mu M.D.   On: 02/26/2023 21:59    Pending Labs Unresulted Labs (From admission, onward)     Start     Ordered   02/27/23 0135  HIV Antibody (routine testing w rflx)  (HIV Antibody (Routine testing w reflex) panel)  Add-on,   AD        02/27/23 0134   02/27/23 0044  Procalcitonin  Add-on,   AD       References:    Procalcitonin Lower Respiratory Tract Infection AND Sepsis Procalcitonin Algorithm   02/27/23 0043            Vitals/Pain Today's Vitals   02/27/23 0530 02/27/23 0547 02/27/23 0600 02/27/23 0630  BP: 127/73  120/72  129/80  Pulse: 78  81 83  Resp: 17  15 16   Temp:  98.7 F (37.1 C)    TempSrc:  Oral    SpO2: 94%  92% 93%  Weight:      Height:      PainSc:  7       Isolation Precautions No active isolations  Medications Medications  famotidine (PEPCID) IVPB 20 mg premix (has no administration in time range)  diphenhydrAMINE (BENADRYL) injection 25 mg (25 mg Intravenous Given 02/27/23 0543)  dexamethasone (DECADRON) tablet 6 mg (has no administration in time range)  aspirin EC tablet 81 mg (has no administration in time range)  atorvastatin (LIPITOR) tablet 80 mg (has no administration in time range)  hydrALAZINE (APRESOLINE) tablet 10 mg (10 mg Oral Not Given 02/27/23 0248)  oxybutynin (DITROPAN-XL) 24 hr tablet 5 mg (5 mg Oral Not Given 02/27/23 0253)  heparin injection 5,000 Units (5,000 Units Subcutaneous Not Given 02/27/23 0548)  acetaminophen (TYLENOL) tablet 650 mg (has no administration in time range)    Or  acetaminophen (TYLENOL) suppository 650 mg (has no administration in time range)  oxyCODONE (Oxy IR/ROXICODONE) immediate release tablet 5 mg (has no administration in time range)  morphine (PF) 2 MG/ML injection 2 mg (2 mg Intravenous Given 02/27/23 0543)  ondansetron (ZOFRAN) tablet 4 mg ( Oral See Alternative 02/27/23 0240)    Or  ondansetron (ZOFRAN) injection 4 mg (4 mg Intravenous Given 02/27/23 0240)  labetalol (NORMODYNE) injection 10 mg (has no administration in time range)  potassium chloride 10 mEq in 100 mL IVPB (has no administration in time range)  sodium chloride 0.9 % bolus 500 mL (0 mLs Intravenous Stopped 02/26/23 2059)  fentaNYL (SUBLIMAZE) injection 50 mcg (50 mcg Intravenous Given 02/26/23 2025)  fluorescein ophthalmic strip 1 strip (1 strip Left Eye Given by Other 02/26/23 2302)  tetracaine (PONTOCAINE) 0.5 % ophthalmic solution 2 drop (2 drops Left Eye Given by Other 02/26/23 2302)  oxyCODONE-acetaminophen (PERCOCET/ROXICET) 5-325 MG per tablet 1 tablet (1 tablet Oral  Given 02/26/23 2203)  iohexol (OMNIPAQUE) 350 MG/ML injection 50 mL (50 mLs Intravenous Contrast Given 02/26/23 2137)  oxyCODONE-acetaminophen (PERCOCET/ROXICET) 5-325 MG per tablet 1 tablet (1 tablet Oral Given 02/26/23 2332)  LORazepam (ATIVAN) injection 1 mg (1 mg Intravenous Given 02/26/23 2255)  cefTRIAXone (ROCEPHIN) 1 g in sodium chloride 0.9 % 100 mL IVPB (0 g Intravenous Stopped 02/27/23 0344)  diphenhydrAMINE (BENADRYL) injection 25 mg (25 mg Intravenous Given 02/26/23 2331)  vancomycin (VANCOREADY) IVPB 2000 mg/400 mL (0 mg Intravenous Stopped 02/27/23 0234)  dexamethasone (DECADRON) injection 10 mg (10 mg Intravenous Given 02/27/23 0241)    Mobility walks     Focused Assessments Cardiac Assessment Handoff:    Lab Results  Component Value Date   CKTOTAL 160 04/20/2011   CKMB 2.5 04/20/2011   TROPONINI <0.30 07/23/2012   Lab Results  Component Value Date   DDIMER 0.66 (H) 07/01/2014   Does the Patient currently have chest pain? No    R Recommendations: See Admitting Provider Note  Report given to:   Additional Notes:

## 2023-02-27 NOTE — Assessment & Plan Note (Signed)
Chronic. Baseline Scr 1.5

## 2023-02-27 NOTE — Assessment & Plan Note (Addendum)
Observation med/surg bed. Unclear the cause of her acute allergic reactions. Has had skin testing by allergy. Show allergy to mold, maple tree, mites, fish. Doubt she has cellulitis of her left eye despite CT report. She certainly has edema of left upper eyelid. But dtr states pt's acute chemosis of both eye started this AM. Went to bed last night feeling and looking ok. Dtr states pt has had multiple episodes of sudden swelling of her lips/tongue. Today, she has not had any upper airway swelling.  She probably needs referral to [facility] for further workup. Will give IV decadron. I Doubt she has shingles as both conjunctiva are swollen/erythematous.. will continue with IV benadryl and IV pepcid.

## 2023-02-27 NOTE — Assessment & Plan Note (Signed)
Uses nightly O2 prn.

## 2023-02-27 NOTE — Progress Notes (Signed)
Patient seen and examined personally, I reviewed the chart, history and physical and admission note, done by admitting physician this morning and agree with the same with following addendum.  Please refer to the morning admission note for more detailed plan of care.  Briefly,  67 year old female w/ history of obesity BMI 41.8, recent diagnosis of diabetes on semaglutide/HLD-on Lipitor/Hypertension on HCTZ, PRN Lasix, hydralazine, CKD stage IIIb, Langerhans cell histiocytosis/interstitial lung disease, unspecified idiopathic urticaria/angioedema presented with onset of swelling that is started in the morning. She went to bed 4/28 without difficulty.  Her eyes were normal.  When she woke 4/29 am she had swelling of both her eyes, left greater than right.  She has not had any fevers.   In ED Temp:90.2 heart rate 105 blood pressure 147/78 satting 94% on room air.White count 6.5, hemoglobin 14.0, platelet 281 Sodium 142, potassium 3.2, chloride of 104, BUN of 14, creatinine 1.4 CT maxillofacial showed hazy inflammatory stranding of the left preseptal periorbital tissues. There is some increased attenuation in the left parotid gland consistent with parotitis.  No stones. EDP performed ocular pressures which were 26 and 28. Fluorescein examinations were done which showed no uptake in the left pupil.  Initially received empiric vancomycin and ceftriaxone. Given patient's chronic urticaria and given the relatively sudden onset of presentation felt to have allergic reaction of unknown etiology, less likely infectious etiology/periorbital cellulitis especially with no leukocytosis and fever,  was having persistent pain despite oxycodone  She was admitted for further management, procalcitonin ordered started on steroid Felt she will need referral to [facility] or [facility] allergy.   This am  afebrile BP stable heart rate in 70s 80s Labs with creatinine 1.4, k 3.2  last night  Seen this am Reprots she feels much better.  Her watering in eyes ears and b/l parotid swelling and tenderness  improving No dysphagia or speech issues, no chest pain tightness rash in body. Has had similar episodes before needing epi pen abt monthly- recent episodes in April 3, 7. Has seen allergy specialist  at [facility] and was told she has allergy to fish dust mold carpet. Has carpet at apartment. Having some headaches.  Updated daughter  A/P:  Chemosis of conjunctiva bilateral Bilateral parotid swelling/neck pain: Unclear etiology of the acute allergic reaction infection less likely,Has had similar episodes before needing epi pen abt monthly- recent episodes in April 3, 7. Has seen allergy specialist  at [facility] and was told she has allergy to fish dust mold carpet. Has carpet at apartment.  This time EpiPen did not get used appropriately but previously has been used. Her symptoms already improving on Decadron, continue supportive cares with symptomatic management Benadryl Pepcid.  She will need referral to allergy specialist at tertiary care [facility].  Will monitor overnight.  Hold off on antibiotics discussed with daughter   CKD stage 3b, GFR 30-44 ml/min (HCC) with metabolic acidosis:Baseline Scr 1.5, stable as below Recent Labs    03/17/22 1001 08/16/22 1117 10/03/22 1120 10/12/22 1328 01/25/23 1048 02/06/23 1022 02/10/23 1013 02/22/23 1012 02/24/23 1522 02/26/23 2033 02/27/23 0906  BUN 30* 26 26 16 17 16 17 21 18 14 13   CREATININE 1.72* 1.57* 1.34* 1.43* 1.39* 1.09* 1.50* 1.50* 1.46* 1.40* 1.42*  CO2 25 28 28 28 24 26 25 26 29   --  20*      T2DM W/ CKD: Blood sugar well-controlled.  Add SSI she was supposed to be starting on Ozempic soon Recent Labs  Lab  02/27/23 1219 02/27/23 1220  GLUCAP 154* 165*   Hypertension continue hydralazine holding HCTZ Lasix for now.. bp stable   Langerhan's cell histiocytosis:Uses nightly O2 prn.

## 2023-02-28 ENCOUNTER — Other Ambulatory Visit: Payer: Self-pay

## 2023-02-28 ENCOUNTER — Other Ambulatory Visit (HOSPITAL_COMMUNITY): Payer: Self-pay

## 2023-02-28 DIAGNOSIS — H11423 Conjunctival edema, bilateral: Secondary | ICD-10-CM | POA: Diagnosis not present

## 2023-02-28 LAB — GLUCOSE, CAPILLARY: Glucose-Capillary: 153 mg/dL — ABNORMAL HIGH (ref 70–99)

## 2023-02-28 MED ORDER — INSULIN STARTER KIT- PEN NEEDLES (ENGLISH)
1.0000 | Freq: Once | Status: AC
  Administered 2023-02-28: 1
  Filled 2023-02-28: qty 1

## 2023-02-28 MED ORDER — LIVING WELL WITH DIABETES BOOK
Freq: Once | Status: AC
  Filled 2023-02-28: qty 1

## 2023-02-28 MED ORDER — INSULIN PEN NEEDLE 32G X 4 MM MISC
0 refills | Status: DC
  Filled 2023-02-28: qty 100, 25d supply, fill #0

## 2023-02-28 MED ORDER — INSULIN LISPRO (1 UNIT DIAL) 100 UNIT/ML (KWIKPEN)
PEN_INJECTOR | SUBCUTANEOUS | 0 refills | Status: DC
  Filled 2023-02-28: qty 9, 30d supply, fill #0
  Filled 2023-04-13: qty 6, 15d supply, fill #0
  Filled 2023-05-19: qty 6, 20d supply, fill #0

## 2023-02-28 MED ORDER — METHYLPREDNISOLONE 4 MG PO TBPK
ORAL_TABLET | ORAL | 0 refills | Status: DC
  Filled 2023-02-28: qty 21, 6d supply, fill #0

## 2023-02-28 NOTE — Evaluation (Signed)
Physical Therapy Evaluation Patient Details Name: Krystal Allen MRN: [id number] DOB: [birth year] Today's Date: 02/28/2023  History of Present Illness  67 y/o F admitted to [facility] on 4/28 for swelling of both eyes, found to have allergic reaction. CT of face showing soft tissue swelling with hazy inflammatory stranding. PMHx: DM, HLD, HTN, CKD, Langerhans cell histiocytosis, interstitial lung disease, idiopathic urticaria, angioedema.  Clinical Impression  Pt presents today functioning at or close to her mobility baseline. Pt performing all mobility with modI or supervision today, fatiguing with increased ambulation, but pt reports being sedentary at baseline, reports she is going to start walking more at home. Pt reports vision improved with ambulation, no dizziness or blurred vision throughout ambulation. Pt reports no concerns with mobility upon discharge, no further benefit from skilled acute PT at this time, no current need for PT at discharge. PT will sign off.        Recommendations for follow up therapy are one component of a multi-disciplinary discharge planning process, led by the attending physician.  Recommendations may be updated based on patient status, additional functional criteria and insurance authorization.  Follow Up Recommendations       Assistance Recommended at Discharge Intermittent Supervision/Assistance  Patient can return home with the following  Assistance with cooking/housework    Equipment Recommendations None recommended by PT  Recommendations for Other Services       Functional Status Assessment Patient has had a recent decline in their functional status and demonstrates the ability to make significant improvements in function in a reasonable and predictable amount of time.     Precautions / Restrictions Precautions Precautions: Fall Restrictions Weight Bearing Restrictions: No      Mobility  Bed Mobility               General bed mobility  comments: pt seated EOB upon arrival, ended with pt sitting EOB    Transfers Overall transfer level: Modified independent Equipment used: None               General transfer comment: no physical assist, increased time    Ambulation/Gait Ambulation/Gait assistance: Supervision Gait Distance (Feet): 225 Feet Assistive device: None Gait Pattern/deviations: Step-through pattern, Decreased stride length, Trunk flexed Gait velocity: decreased     General Gait Details: trunk flexion improving with distance, pt fatiguing, ~90% SPO2 on room air, improving with seated rest break  Stairs            Wheelchair Mobility    Modified Rankin (Stroke Patients Only)       Balance Overall balance assessment: Mild deficits observed, not formally tested                                           Pertinent Vitals/Pain Pain Assessment Pain Assessment: No/denies pain    Home Living Family/patient expects to be discharged to:: Private residence Living Arrangements: Children Available Help at Discharge: Family;Available PRN/intermittently Type of Home: Apartment Home Access: Level entry       Home Layout: One level Home Equipment: None Additional Comments: oxygen at night    Prior Function Prior Level of Function : Independent/Modified Independent;Driving             Mobility Comments: Independent, drives, retired, denies falls       Hand Dominance   Dominant Hand: Right    Extremity/Trunk Assessment   Upper Extremity  Assessment Upper Extremity Assessment: Overall WFL for tasks assessed    Lower Extremity Assessment Lower Extremity Assessment: Overall WFL for tasks assessed    Cervical / Trunk Assessment Cervical / Trunk Assessment: Kyphotic  Communication   Communication: No difficulties  Cognition Arousal/Alertness: Awake/alert Behavior During Therapy: WFL for tasks assessed/performed Overall Cognitive Status: Within Functional  Limits for tasks assessed                                 General Comments: A&Ox4        General Comments General comments (skin integrity, edema, etc.): no family at bedside, fatiguing with mobility but admits to being sedentary, SPO2 at 90% or higher on room air    Exercises     Assessment/Plan    PT Assessment Patient does not need any further PT services  PT Problem List         PT Treatment Interventions      PT Goals (Current goals can be found in the Care Plan section)  Acute Rehab PT Goals Patient Stated Goal: go home PT Goal Formulation: All assessment and education complete, DC therapy    Frequency       Co-evaluation               AM-PAC PT "6 Clicks" Mobility  Outcome Measure Help needed turning from your back to your side while in a flat bed without using bedrails?: None Help needed moving from lying on your back to sitting on the side of a flat bed without using bedrails?: None Help needed moving to and from a bed to a chair (including a wheelchair)?: None Help needed standing up from a chair using your arms (e.g., wheelchair or bedside chair)?: None Help needed to walk in hospital room?: A Little Help needed climbing 3-5 steps with a railing? : A Little 6 Click Score: 22    End of Session Equipment Utilized During Treatment: Gait belt Activity Tolerance: Patient tolerated treatment well Patient left: in bed;with call bell/phone within reach;with nursing/sitter in room Nurse Communication: Mobility status PT Visit Diagnosis: Difficulty in walking, not elsewhere classified (R26.2)    Time: 1610-9604 PT Time Calculation (min) (ACUTE ONLY): 13 min   Charges:   PT Evaluation $PT Eval Low Complexity: 1 Low          Lindalou Hose, PT DPT Acute Rehabilitation Services Office [phone]   Leonie Man 02/28/2023, 2:08 PM

## 2023-02-28 NOTE — Inpatient Diabetes Management (Signed)
Inpatient Diabetes Program Recommendations  AACE/ADA: New Consensus Statement on Inpatient Glycemic Control  Target Ranges:  Prepandial:   less than 140 mg/dL      Peak postprandial:   less than 180 mg/dL (1-2 hours)      Critically ill patients:  140 - 180 mg/dL    Latest Reference Range & Units 02/27/23 12:19 02/27/23 12:20 02/27/23 16:32 02/27/23 22:23 02/28/23 07:52  Glucose-Capillary 70 - 99 mg/dL 098 (H) 119 (H) 147 (H) 215 (H) 153 (H)    Latest Reference Range & Units 02/20/23 11:50  HbA1c, POC (controlled diabetic range) 0.0 - 7.0 % 6.7   Review of Glycemic Control  Diabetes history: DM2 (recently dx on 02/20/23 by PCP) Outpatient Diabetes medications: Ozempic 0.25 mg Qweek (prescribed 02/20/23 but was not able to start before hospital admission) Current orders for Inpatient glycemic control: Novolog 0-20 units TID with meals, Novolog 0-5 units QHS; Decadron 6 mg daily  NOTE: Received consult for DM and insulin teaching. Ordered Living Well with DM book and an insulin pen starter kit. In reviewing chart noted patient was just recently dx with DM on 02/20/23 by PCP and was prescribed Ozempic 0.25 mg Qweek. Spoke with patient about recently new diabetes diagnosis. Patient states that she was just dx with DM on 02/20/23 when she seen her PCP and has not received any formal DM education yet. Patient states that she was prescribed Ozempic but she has not been able to start it yet as her medications get delivered to her home and she had not received it prior to hospital admission.  Discussed A1C results (6.7% on 02/20/23) and explained what an A1C is and informed patient that current A1C indicates an average glucose of 146 mg/dl over the past 2-3 months. Discussed basic pathophysiology of DM Type 2, basic home care, importance of checking CBGs and maintaining good CBG control to prevent long-term and short-term complications. Reviewed glucose and A1C goals.  Reviewed signs and symptoms of  hyperglycemia and hypoglycemia along with treatment for both. Discussed impact of nutrition, exercise, stress, sickness, and medications on diabetes control. Reviewed Living Well with diabetes booklet and encouraged patient to read through entire book. Informed patient that she will be prescribed a steroid dose pack so attending provider is discharging her on Humalog correction scale using an insulin pen. Discussed Humalog and how it works; discussed how to use a correction scale. Educated patient on insulin pen use at home. Reviewed contents of insulin flexpen starter kit. Reviewed all steps of insulin pen including attachment of needle, 2-unit air shot, dialing up dose, giving injection, removing needle, disposal of sharps, storage of unused insulin, disposal of insulin etc. Patient able to provide successful return demonstration. Patient has a glucometer at home and had started doing CBGs at home. Patient reports she plans to make changes with diet and exercise.  Encouraged patient to get glucose tablets to have on insulin in case of hypoglycemia. Encouraged patient to reach out to her PCP if she has any issues at all with hypoglycemia.  Patient verbalized understanding of information discussed and she states that she has no further questions at this time related to diabetes.    Thanks, Orlando Penner, RN, MSN, CDE Diabetes Coordinator Inpatient Diabetes Program [phone] (Team Pager from 8am to 5pm)

## 2023-02-28 NOTE — Discharge Summary (Signed)
Krystal Allen AVW:[id number] DOB: 1955/11/08 DOA: 02/26/2023  PCP: Marcine Matar, MD  Admit date: 02/26/2023  Discharge date: 02/28/2023  Admitted From: Home   Disposition:  Home   Recommendations for Outpatient Follow-up:   Follow up with PCP in 1-2 weeks  PCP Please obtain BMP/CBC, 2 view CXR in 1week,  (see Discharge instructions)   PCP Please follow up on the following pending results:    Home Health: None   Equipment/Devices: None  Consultations: None  Discharge Condition: Stable    CODE STATUS: Full    Diet Recommendation: Heart Healthy Low Carb    Chief Complaint  Patient presents with   Allergic Reaction     Brief history of present illness from the day of admission and additional interim summary    67 year old female w/ history of obesity BMI 41.8, recent diagnosis of diabetes on semaglutide/HLD-on Lipitor/Hypertension on HCTZ, PRN Lasix, hydralazine, CKD stage IIIb, Langerhans cell histiocytosis/interstitial lung disease, unspecified idiopathic urticaria/angioedema presented with onset of swelling that is started in the morning. She went to bed 4/28 without difficulty. Her eyes were normal. When she woke 4/29 am she had swelling of both her eyes, left greater than right. She has not had any fevers, she was admitted for acute allergic reaction.                                                                 Hospital Course   Acute allergic reaction with facial swelling and neck pain.  She has history of urticaria and follows with an allergist, has a home EpiPen but when the allergic reaction started at home she was unable to take EpiPen due to pain and sensitivity, she was brought to the hospital where she was treated with steroids with almost complete resolution of her symptoms.  Stable  procalcitonin no signs of infection.  She feels much better and is eager to be discharged home.  Will be placed on a Medrol Dosepak steroid taper, she has EpiPen at home and has been counseled to take it in the future if needs arises, no clear trigger has been identified she has an allergist which home she will follow-up within a week of discharge along with PCP.  CKD stage IIIb.  Close to baseline.   DM type II.  Has a insulin monitor, since she is going to be on steroid taper sliding scale short acting insulin provided, insulin teaching prior to discharge.  Hypertension.  Commence home medications.  Langerhan's  cell histiocytosis.  Nighttime oxygen continued.  Outpatient follow-up with PCP.   Discharge diagnosis     Principal Problem:   Chemosis of conjunctiva, bilateral Active Problems:   Hypertension associated with type 2 diabetes mellitus (HCC)   Obesity (BMI 30-39.9)   Type 2 diabetes mellitus with chronic  kidney disease, without long-term current use of insulin (HCC)   CKD stage 3b, GFR 30-44 ml/min (HCC) - Baseline Scr 1.5   Langerhan's cell histiocytosis [facility])    Discharge instructions    Discharge Instructions     Ambulatory Referral for Lung Cancer Scre   Complete by: As directed    Diet - low sodium heart healthy   Complete by: As directed    Discharge instructions   Complete by: As directed    Follow with Primary MD Marcine Matar, MD in 7 days, follow-up with your allergist within a week of discharge  Get CBC, CMP, DCM-  checked next visit with your primary MD    Activity: As tolerated with Full fall precautions use walker/cane & assistance as needed  Disposition Home    Diet: Heart Healthy low carbohydrate diet, check your CBGs q. ACH S.  Special Instructions: If you have smoked or chewed Tobacco  in the last 2 yrs please stop smoking, stop any regular Alcohol  and or any Recreational drug use.  On your next visit with your primary care physician  please Get Medicines reviewed and adjusted.  Please request your Prim.MD to go over all Hospital Tests and Procedure/Radiological results at the follow up, please get all Hospital records sent to your Prim MD by signing hospital release before you go home.  If you experience worsening of your admission symptoms, develop shortness of breath, life threatening emergency, suicidal or homicidal thoughts you must seek medical attention immediately by calling 911 or calling your MD immediately  if symptoms less severe.  You Must read complete instructions/literature along with all the possible adverse reactions/side effects for all the Medicines you take and that have been prescribed to you. Take any new Medicines after you have completely understood and accpet all the possible adverse reactions/side effects.   Increase activity slowly   Complete by: As directed        Discharge Medications   Allergies as of 02/28/2023       Reactions   Sulfa Antibiotics Anaphylaxis, Hives   Chantix [varenicline Tartrate]    Bad dreams   Fish Allergy Other (See Comments)   Severe stomach cramps    Hygroton [chlorthalidone]    Swelling or hives   Penicillins Other (See Comments)   Pt took Amoxicillin in 2019 without issue, reports PCN caused rash in the past   Latex Rash, Other (See Comments)   Dry skin        Medication List     STOP taking these medications    phentermine 37.5 MG capsule       TAKE these medications    Accu-Chek Guide test strip Generic drug: glucose blood Use to check blood sugar three times daily. E11.69   Accu-Chek Guide w/Device Kit Use to check blood sugar three times daily. E11.69   Accu-Chek Softclix Lancets lancets Use to check blood sugar three times daily. E11.69   acetaminophen 650 MG CR tablet Commonly known as: Tylenol 8 Hour Take 1 tablet (650 mg total) by mouth 2 (two) times daily as needed for pain.   albuterol (2.5 MG/3ML) 0.083% nebulizer  solution Commonly known as: PROVENTIL TAKE 3 MLS BY NEBULIZATION EVERY 6 (SIX) HOURS AS NEEDED FOR WHEEZING OR SHORTNESS OF BREATH.   albuterol 108 (90 Base) MCG/ACT inhaler Commonly known as: ProAir HFA Inhale 2 puffs into the lungs every 4 (four) hours as needed for wheezing or shortness of breath.   aspirin EC 81 MG  tablet Take 1 tablet (81 mg total) by mouth daily.   atorvastatin 80 MG tablet Commonly known as: LIPITOR Take 1 tablet (80 mg total) by mouth daily.   Breo Ellipta 200-25 MCG/ACT Aepb Generic drug: fluticasone furoate-vilanterol Inhale 1 puff into the lungs once daily.   cetirizine 10 MG tablet Commonly known as: ZYRTEC Take 1 tablet (10 mg total) by mouth daily.   Deep Sea Nasal Spray 0.65 % nasal spray Generic drug: sodium chloride Place 2 sprays into both nostrils every 4 (four) hours. What changed:  how to take this when to take this reasons to take this   diphenhydrAMINE 25 mg capsule Commonly known as: BENADRYL Take 25-50 mg by mouth every 6 (six) hours as needed for itching or allergies.   EPINEPHrine 0.3 mg/0.3 mL Soaj injection Commonly known as: EPI-PEN Inject 0.3 mg into the muscle as needed for anaphylaxis.   ezetimibe 10 MG tablet Commonly known as: ZETIA Take 1 tablet (10 mg total) by mouth daily.   famotidine 20 MG tablet Commonly known as: Pepcid Take 1 tablet (20 mg total) by mouth 2 (two) times daily as needed (hives).   fluticasone 50 MCG/ACT nasal spray Commonly known as: FLONASE Place 2 sprays into both nostrils daily.   Flutter Devi Use as directed   FreeStyle Libre 2 [employer] Use to check blood sugar continuously throughout the day. Change sensors once every 14 days.   FreeStyle Libre 2 Sensor Misc Use to check blood sugar continuously throughout the day.   FT Allergy Relief 24 Hour 180 MG tablet Generic drug: fexofenadine Take 1 tablet (180 mg total) by mouth 2 (two) times daily as needed (hives).   furosemide  20 MG tablet Commonly known as: LASIX Take 1 tablet (20 mg total) by mouth daily as needed (for weight gain of 2-3 lbs overnight or swelling in legs/ankles).   hydrALAZINE 10 MG tablet Commonly known as: APRESOLINE Take 1 tablet (10 mg total) by mouth 2 (two) times daily.   hydrochlorothiazide 25 MG tablet Commonly known as: HYDRODIURIL Take 1 tablet (25 mg total) by mouth daily.   hydrOXYzine 25 MG tablet Commonly known as: ATARAX Take 1 tablet (25 mg total) by mouth every 8 (eight) hours as needed for up to 15 doses for itching.   insulin lispro 100 UNIT/ML KwikPen Commonly known as: HumaLOG KwikPen Before each meal 3 times a day, 140-199 - 2 units, 200-250 - 4 units, 251-299 - 6 units,  300-349 - 8 units,  350 or above 10 units. Insulin PEN if approved, provide syringes and needles if needed.Please switch to any approved short acting Insulin if needed.   Insulin Syringe-Needle U-100 25G X 1" 1 ML Misc For 4 times a day insulin SQ, 1 month supply. Diagnosis E11.65   Medical Compression Stockings Misc R60.0   methylPREDNISolone 4 MG Tbpk tablet Commonly known as: MEDROL DOSEPAK follow package directions   Olopatadine HCl 0.2 % Soln Place 1 drop into both eyes daily as needed (allergies).   omeprazole 20 MG capsule Commonly known as: PRILOSEC Take 1 capsule (20 mg total) by mouth daily.   oxybutynin 5 MG 24 hr tablet Commonly known as: Ditropan XL Take 1 tablet (5 mg total) by mouth at bedtime. What changed:  when to take this reasons to take this   Ozempic (0.25 or 0.5 MG/DOSE) 2 MG/3ML Sopn Generic drug: Semaglutide(0.25 or 0.5MG /DOS) Inject 0.25 mg into the skin once a week.   potassium chloride SA 20 MEQ  tablet Commonly known as: KLOR-CON M Take 2 tablets ( total) by mouth every morning and 1 tablet ( total) every evening for the next 3 days (02/24/23, 02/25/23, and 02/26/23). Starting on 02/27/23 take 1 tablet by mouth twice daily.   Repatha  SureClick 140 MG/ML Soaj Generic drug: Evolocumab Inject 140 mg into the skin every 14 (fourteen) days.   Spiriva Respimat 2.5 MCG/ACT Aers Generic drug: Tiotropium Bromide Monohydrate Inhale 2 puffs into the lungs daily.         Follow-up Information     Marcine Matar, MD. Schedule an appointment as soon as possible for a visit in 1 week(s).   Specialty: Internal Medicine Why: Also follow-up with your allergist within a week of discharge Contact information: [address] [place] [address] [phone]                 Major procedures and Radiology Reports - PLEASE review detailed and final reports thoroughly  -        DG Chest 2 View  Result Date: 02/26/2023 CLINICAL DATA:  Left-sided facial swelling and possible mass, initial encounter EXAM: CHEST - 2 VIEW COMPARISON:  08/10/2022 FINDINGS: Cardiac shadow is mildly prominent but stable from the prior exam. Lungs are well aerated bilaterally. No focal infiltrate or sizable effusion is seen. No acute bony abnormality is noted. IMPRESSION: No acute abnormality noted. Electronically Signed   By: Alcide Clever M.D.   On: 02/26/2023 22:05   CT Maxillofacial W Contrast  Result Date: 02/26/2023 CLINICAL DATA:  Initial evaluation for acute left-sided facial swelling. EXAM: CT MAXILLOFACIAL WITH CONTRAST TECHNIQUE: Multidetector CT imaging of the maxillofacial structures was performed with intravenous contrast. Multiplanar CT image reconstructions were also generated. RADIATION DOSE REDUCTION: This exam was performed according to the departmental dose-optimization program which includes automated exposure control, adjustment of the mA and/or kV according to patient size and/or use of iterative reconstruction technique. CONTRAST:  50mL OMNIPAQUE IOHEXOL 350 MG/ML SOLN COMPARISON:  None Available. FINDINGS: Osseous: No acute osseous finding.  No worrisome osseous lesions. Orbits: Soft tissue swelling seen  involving the left preseptal periorbital soft tissues, concerning for acute periorbital cellulitis. No postseptal or intraorbital extension. Globes and orbital soft tissues otherwise unremarkable. Sinuses: Paranasal sinuses are largely clear. Mastoid air cells and middle ear cavities are well pneumatized and free of fluid. Soft tissues: Soft tissue swelling with hazy inflammatory stranding about the left preseptal periorbital soft tissues. Swelling extends inferiorly to involve the left face. Minimally increased attenuation within the left parotid gland as compared to the right, suggesting a degree of concomitant parotitis. No obstructive stone within Stensen's duct. No discrete abscess or drainable fluid collection. Limited intracranial: Unremarkable. IMPRESSION: 1. Soft tissue swelling with hazy inflammatory stranding about the left preseptal periorbital soft tissues, concerning for acute periorbital cellulitis. No postseptal or intraorbital extension. 2. Minimally increased attenuation within the left parotid gland as compared to the right, suggesting a degree of concomitant parotitis. No obstructive stone within Stensen's duct. No discrete abscess or drainable fluid collection. Electronically Signed   By: Rise Mu M.D.   On: 02/26/2023 21:59    Micro Results     No results found for this or any previous visit (from the past 240 hour(s)).  Today   Subjective    Kaelan Emami today has no headache,no chest abdominal pain,no new weakness tingling or numbness, feels much better wants to go home today.     Objective   Blood  pressure 107/65, pulse 67, temperature 97.9 F (36.6 C), temperature source Oral, resp. rate 18, height 5\' 6"  (1.676 m), weight 121.8 kg, SpO2 90 %.   Intake/Output Summary (Last 24 hours) at 02/28/2023 0748 Last data filed at 02/27/2023 1726 Gross per 24 hour  Intake 50 ml  Output --  Net 50 ml    Exam  Awake Alert, No new F.N deficits,     [place].AT,PERRAL Supple Neck,   Symmetrical Chest wall movement, Good air movement bilaterally, CTAB RRR,No Gallops,   +ve B.Sounds, Abd Soft, Non tender,  No Cyanosis, Clubbing or edema    Data Review   Recent Labs  Lab 02/24/23 1522 02/26/23 1849 02/26/23 2033  WBC 5.6 6.5  --   HGB 13.6 14.0 13.9  HCT 42.3 43.6 41.0  PLT 293 281  --   MCV 89.2 88.8  --   MCH 28.7 28.5  --   MCHC 32.2 32.1  --   RDW 17.8* 17.9*  --   LYMPHSABS 2.4 1.9  --   MONOABS 0.4 0.3  --   EOSABS 0.2 0.0  --   BASOSABS 0.1 0.0  --     Recent Labs  Lab 02/22/23 1012 02/24/23 1522 02/26/23 2033 02/27/23 0906 02/27/23 2329  NA 141 139 142 136  --   K 3.2* 3.0* 3.2* 3.6  --   CL 98 102 104 102  --   CO2 26 29  --  20*  --   ANIONGAP  --  8  --  14  --   GLUCOSE 143* 109* 107* 144*  --   BUN 21 18 14 13   --   CREATININE 1.50* 1.46* 1.40* 1.42*  --   AST  --  21  --   --   --   ALT  --  17  --   --   --   ALKPHOS  --  131*  --   --   --   BILITOT  --  0.8  --   --   --   ALBUMIN  --  3.1*  --   --   --   PROCALCITON  --   --   --  <0.10  --   HGBA1C  --   --   --   --  6.8*  CALCIUM 9.1 8.8*  --  8.9  --     Total Time in preparing paper work, data evaluation and todays exam - 35 minutes  Signature  -    Susa Raring M.D on 02/28/2023 at 7:48 AM   -  To page go to [email or web address]

## 2023-02-28 NOTE — Care Management (Signed)
  Transition of Care [facility]) Screening Note   Patient Details  Name: Krystal Allen Date of Birth: 1956-10-09   Transition of [facility]) CM/SW Contact:    Gordy Clement, RN Phone Number: 02/28/2023, 9:22 AM    Transition of Care Department [facility]) has reviewed patient and no TOC needs have been identified at this time. We will continue to monitor patient advancement through interdisciplinary progression rounds. If new patient transition needs arise, please place a TOC consult.

## 2023-02-28 NOTE — Progress Notes (Signed)
Explained discharge instructions to patient. Reviewed follow up appointment and next medication administration times. Also reviewed education. Patient verbalized having an understanding for instructions given. All belongings are in the patient's possession. TOC meds will be delivered to the discharge lounge. IV and telemetry were removed. CCMD was notified. No other needs verbalized. Transported downstairs to await ride home.

## 2023-03-01 ENCOUNTER — Other Ambulatory Visit: Payer: Self-pay

## 2023-03-01 ENCOUNTER — Other Ambulatory Visit (HOSPITAL_COMMUNITY): Payer: Self-pay

## 2023-03-01 ENCOUNTER — Telehealth: Payer: Self-pay

## 2023-03-01 ENCOUNTER — Telehealth: Payer: Self-pay | Admitting: Internal Medicine

## 2023-03-01 LAB — TRYPTASE: Tryptase: 14 ug/L — ABNORMAL HIGH (ref 2.2–13.2)

## 2023-03-01 NOTE — Telephone Encounter (Signed)
Copied from CRM [id number]. Topic: Referral - Request for Referral >> Mar 01, 2023  9:45 AM [name] wrote: Has patient seen PCP for this complaint? No.  Pt was seen in the Emergency Room *If NO, is insurance requiring patient see PCP for this issue before PCP can refer them? Referral for which specialty: Per Emergency Dept pt is needing to be referred to a foot doctor, atherosclerotic vascular disease doctor, and is needing a referral to [facility] for allergy Preferred provider/office: Pt would like for PCP to refer.  Reason for referral: pt hands and feet keep turning blue. Pt keep having issue with her allergies and getting clogged back up, per doctor at the Emergency Room she will need to be referred to [facility] for them to figure what is going on with her allergies.

## 2023-03-01 NOTE — Telephone Encounter (Signed)
These concerns will be addressed at her upcoming office visit in 5 days as there needs to be documentation in the chart to accompany referral.

## 2023-03-01 NOTE — Transitions of Care (Post Inpatient/ED Visit) (Signed)
   03/01/2023  Name: DAILA ELBERT MRN: [id number] DOB: 03-23-1956  Today's TOC FU Call Status: Today's TOC FU Call Status:: Unsuccessul Call (1st Attempt) Unsuccessful Call (1st Attempt) Date: 03/01/23  Attempted to reach the patient regarding the most recent Inpatient/ED visit.  Follow Up Plan: Additional outreach attempts will be made to reach the patient to complete the Transitions of Care (Post Inpatient/ED visit) call.     Antionette Fairy, RN,BSN,CCM [facility] Care Management Care Management Community Coordinator Direct Phone: [phone] Toll Free: [phone] Fax: [phone]

## 2023-03-02 ENCOUNTER — Telehealth: Payer: Self-pay

## 2023-03-02 NOTE — Transitions of Care (Post Inpatient/ED Visit) (Signed)
03/02/2023  Name: Krystal Allen MRN: [id number] DOB: June 29, 1956  Today's TOC FU Call Status: Today's TOC FU Call Status:: Successful TOC FU Call Competed TOC FU Call Complete Date: 03/02/23  Transition Care Management Follow-up Telephone Call Date of Discharge: 02/28/23 Discharge Facility: [facility]) Type of Discharge: Inpatient Admission Primary Inpatient Discharge Diagnosis:: 'lung nodule,cellulitis of periorbital region of both eyes" How have you been since you were released from the hospital?: Better (Pt states she is feeling better overall but "still has outbreak of rash/hives to hand,chest,face and arms that she had while in hospital." She is taking meds as ordered and denies any changes in diet or routine.) Any questions or concerns?: Yes Patient Questions/Concerns:: Pt reports she needs referral to allergist at [facility] and foot doctor. Patient Questions/Concerns Addressed: Other: (Pt has already contacted MD office-noted in chart-MD aware and will address at upcoming appt. Pt also followed by [facility] Allergist-Dr. Allena Katz. She will call office to make an appt with them.)  Items Reviewed: Did you receive and understand the discharge instructions provided?: Yes Medications obtained,verified, and reconciled?: Yes (Medications Reviewed) Any new allergies since your discharge?: No Dietary orders reviewed?: Yes Type of Diet Ordered:: low salt/heart healthy/carb modified Do you have support at home?: No  Medications Reviewed Today: Medications Reviewed Today     Reviewed by Charlyn Minerva, RN (Registered Nurse) on 03/02/23 at 1037  Med List Status: <None>   Medication Order Taking? Sig Documenting Provider Last Dose Status Informant  Accu-Chek Softclix Lancets lancets [id number]  Use to check blood sugar three times daily. E11.69 Marcine Matar, MD  Active Child, Self  acetaminophen (TYLENOL 8 HOUR) 650 MG CR tablet [id number] No Take 1 tablet (650  mg total) by mouth 2 (two) times daily as needed for pain. Dessa Phi, MD 02/27/2023 am Active Child, Self  albuterol (PROAIR HFA) 108 (90 Base) MCG/ACT inhaler [id number] No Inhale 2 puffs into the lungs every 4 (four) hours as needed for wheezing or shortness of breath. Marcine Matar, MD Past Week Active Child, Self  albuterol (PROVENTIL) (2.5 MG/3ML) 0.083% nebulizer solution [id number] No TAKE 3 MLS BY NEBULIZATION EVERY 6 (SIX) HOURS AS NEEDED FOR WHEEZING OR SHORTNESS OF BREATH. Marcine Matar, MD Past Week Active Child, Self  aspirin EC 81 MG tablet [id number] No Take 1 tablet (81 mg total) by mouth daily. Marcine Matar, MD 02/27/2023 Active Child, Self  atorvastatin (LIPITOR) 80 MG tablet [id number] No Take 1 tablet (80 mg total) by mouth daily. Orbie Pyo, MD 02/27/2023 Active Child, Self  Blood Glucose Monitoring Suppl (ACCU-CHEK GUIDE) w/Device KIT [id number]  Use to check blood sugar three times daily. E11.69 Marcine Matar, MD  Active Child, Self  cetirizine (ZYRTEC) 10 MG tablet [id number] No Take 1 tablet (10 mg total) by mouth daily. Mayers, Kasandra Knudsen, PA-C 02/27/2023 Active Child, Self  Continuous Glucose Receiver (FREESTYLE LIBRE 2 READER) DEVI [id number]  Use to check blood sugar continuously throughout the day. Change sensors once every 14 days. Marcine Matar, MD  Active Child, Self  Continuous Glucose Sensor (FREESTYLE LIBRE 2 SENSOR) [place] [id number]  Use to check blood sugar continuously throughout the day. Marcine Matar, MD  Active Child, Self  diphenhydrAMINE (BENADRYL) 25 mg capsule [id number] No Take 25-50 mg by mouth every 6 (six) hours as needed for itching or allergies. [provider] Past Week Active Child, Self  Elastic Bandages & Supports (MEDICAL COMPRESSION STOCKINGS) MISC [id number] No  R60.0 Claiborne Rigg, NP Taking Active Child, Self  EPINEPHrine 0.3 mg/0.3 mL IJ SOAJ injection [id number] No Inject 0.3 mg into the muscle as needed for  anaphylaxis. Marcine Matar, MD UNKNOWN Active Child, Self  Evolocumab (REPATHA SURECLICK) 140 MG/ML Ivory Broad [id number] No Inject 140 mg into the skin every 14 (fourteen) days. Chrystie Nose, MD 02/15/2023 Active Child, Self           Med Note (MCNEIL, KAREN A   Wed Jan 25, 2023 10:21 AM)    ezetimibe (ZETIA) 10 MG tablet [id number] No Take 1 tablet (10 mg total) by mouth daily. Orbie Pyo, MD 02/27/2023 Active Child, Self  famotidine (PEPCID) 20 MG tablet [id number] No Take 1 tablet (20 mg total) by mouth 2 (two) times daily as needed (hives). Birder Robson, MD Past Week Active Child, Self  fexofenadine [facility] ALLERGY) 180 MG tablet [id number] No Take 1 tablet (180 mg total) by mouth 2 (two) times daily as needed (hives). Birder Robson, MD Past Week Active Child, Self  fluticasone (FLONASE) 50 MCG/ACT nasal spray [id number] No Place 2 sprays into both nostrils daily. Birder Robson, MD 02/27/2023 Active Child, Self  fluticasone furoate-vilanterol (BREO ELLIPTA) 200-25 MCG/ACT AEPB [id number] No Inhale 1 puff into the lungs once daily. Luciano Cutter, MD 02/27/2023 Active Child, Self  furosemide (LASIX) 20 MG tablet [id number] No Take 1 tablet (20 mg total) by mouth daily as needed (for weight gain of 2-3 lbs overnight or swelling in legs/ankles). Alver Sorrow, NP Past Week Active Child, Self  glucose blood (ACCU-CHEK GUIDE) test strip [id number]  Use to check blood sugar three times daily. E11.69 Marcine Matar, MD  Active Child, Self  hydrALAZINE (APRESOLINE) 10 MG tablet [id number] No Take 1 tablet (10 mg total) by mouth 2 (two) times daily. Marcine Matar, MD 02/27/2023 am Active Child, Self  hydrochlorothiazide (HYDRODIURIL) 25 MG tablet [id number] No Take 1 tablet (25 mg total) by mouth daily. Marcine Matar, MD 02/27/2023 Active Child, Self  hydrOXYzine (ATARAX) 25 MG tablet [id number] No Take 1 tablet (25 mg total) by mouth every 8 (eight) hours as needed for up to 15 doses for  itching. Terald Sleeper, MD Past Week Active Child, Self  insulin lispro (HUMALOG KWIKPEN) 100 UNIT/ML KwikPen [id number]  Before each meal 3 times a day, 140-199 - 2 units, 200-250 - 4 units, 251-299 - 6 units,  300-349 - 8 units,  350 or above 10 units. Leroy Sea, MD  Active   Insulin Pen Needle 32G X 4 MM MISC [id number]  Use 4 times daily with insulin pen. Leroy Sea, MD  Active   methylPREDNISolone (MEDROL DOSEPAK) 4 MG TBPK tablet [id number]  Follow package directions Leroy Sea, MD  Active   Olopatadine HCl 0.2 % SOLN [id number] No Place 1 drop into both eyes daily as needed (allergies). Birder Robson, MD UNKNOWN Active Child, Self  omeprazole (PRILOSEC) 20 MG capsule [id number] No Take 1 capsule (20 mg total) by mouth daily. Marcine Matar, MD 02/27/2023 Active Child, Self  oxybutynin (DITROPAN XL) 5 MG 24 hr tablet [id number] No Take 1 tablet (5 mg total) by mouth at bedtime.  Patient taking differently: Take 5 mg by mouth daily as needed (for urinary incontinence).   Freddy Finner, NP UNKNOWN Active Child, Self  potassium chloride SA (KLOR-CON M) 20 MEQ tablet [id number] No Take 2 tablets ( total) by mouth every morning and 1 tablet (  total) every evening for the next 3 days (02/24/23, 02/25/23, and 02/26/23). Starting on 02/27/23 take 1 tablet by mouth twice daily. Alver Sorrow, NP 02/27/2023 Active Child, Self  Respiratory Therapy Supplies (FLUTTER) DEVI [id number] No Use as directed Chilton Greathouse, MD Taking Active Child, Self  Semaglutide,0.25 or 0.5MG /DOS, 2 MG/3ML SOPN [id number] No Inject 0.25 mg into the skin once a week. Marcine Matar, MD Past Week Active Child, Self  sodium chloride (OCEAN) 0.65 % nasal spray [id number] No Place 2 sprays into both nostrils every 4 (four) hours.  Patient taking differently: Place 2 sprays into the nose every 4 (four) hours as needed for congestion.   Gwenevere Abbot, MD Past Month Active Child, Self  Tiotropium  Bromide Monohydrate (SPIRIVA RESPIMAT) 2.5 MCG/ACT AERS [id number] No Inhale 2 puffs into the lungs daily. Luciano Cutter, MD 02/27/2023 Active Child, Self            Home Care and Equipment/Supplies: Were Home Health Services Ordered?: NA Any new equipment or medical supplies ordered?: NA  Functional Questionnaire: Do you need assistance with bathing/showering or dressing?: No Do you need assistance with meal preparation?: No Do you need assistance with eating?: No Do you have difficulty maintaining continence: No Do you need assistance with getting out of bed/getting out of a chair/moving?: No Do you have difficulty managing or taking your medications?: No  Follow up appointments reviewed: PCP Follow-up appointment confirmed?: Yes Date of PCP follow-up appointment?: 03/06/23 Follow-up Provider: [place] Specialist [facility] Follow-up appointment confirmed?: No Reason Specialist Follow-Up Not Confirmed: Patient has Specialist Provider Number and will Call for Appointment Do you need transportation to your follow-up appointment?: No Do you understand care options if your condition(s) worsen?: Yes-patient verbalized understanding  SDOH Interventions Today    Flowsheet Row Most Recent Value  SDOH Interventions   Food Insecurity Interventions Intervention Not Indicated  Transportation Interventions Intervention Not Indicated      TOC Interventions Today    Flowsheet Row Most Recent Value  TOC Interventions   TOC Interventions Discussed/Reviewed TOC Interventions Discussed, S/S of infection      Interventions Today    Flowsheet Row Most Recent Value  Chronic Disease   Chronic disease during today's visit Diabetes  General Interventions   General Interventions Discussed/Reviewed General Interventions Discussed, Doctor Visits  Doctor Visits Discussed/Reviewed Doctor Visits Discussed, PCP  PCP/Specialist Visits Compliance with follow-up visit  Education  Interventions   Education Provided Provided Education  Provided Verbal Education On Nutrition, When to see the doctor, Medication, Blood Sugar Monitoring  Nutrition Interventions   Nutrition Discussed/Reviewed Nutrition Discussed, Adding fruits and vegetables, Decreasing salt, Decreasing sugar intake  Pharmacy Interventions   Pharmacy Dicussed/Reviewed Pharmacy Topics Discussed, Medications and their functions       Alessandra Grout [facility] Care Management Care Management Community Coordinator Direct Phone: [phone] Toll Free: [phone] Fax: [phone]

## 2023-03-06 ENCOUNTER — Ambulatory Visit: Payer: 59 | Attending: Physician Assistant | Admitting: Physician Assistant

## 2023-03-06 ENCOUNTER — Encounter: Payer: Self-pay | Admitting: Physician Assistant

## 2023-03-06 VITALS — BP 119/78 | HR 64 | Wt 259.6 lb

## 2023-03-06 DIAGNOSIS — E1165 Type 2 diabetes mellitus with hyperglycemia: Secondary | ICD-10-CM

## 2023-03-06 DIAGNOSIS — T7840XD Allergy, unspecified, subsequent encounter: Secondary | ICD-10-CM | POA: Diagnosis not present

## 2023-03-06 DIAGNOSIS — C966 Unifocal Langerhans-cell histiocytosis: Secondary | ICD-10-CM

## 2023-03-06 DIAGNOSIS — Z794 Long term (current) use of insulin: Secondary | ICD-10-CM

## 2023-03-06 DIAGNOSIS — R0989 Other specified symptoms and signs involving the circulatory and respiratory systems: Secondary | ICD-10-CM | POA: Diagnosis not present

## 2023-03-06 DIAGNOSIS — Z09 Encounter for follow-up examination after completed treatment for conditions other than malignant neoplasm: Secondary | ICD-10-CM

## 2023-03-06 LAB — GLUCOSE, POCT (MANUAL RESULT ENTRY): POC Glucose: 94 mg/dl (ref 70–99)

## 2023-03-06 NOTE — Progress Notes (Signed)
Patient ID: Krystal Allen, female   DOB: 1956-03-08, 67 y.o.   MRN: [id number]    Krystal Allen, is a 67 y.o. female  WUJ:[id number]  NFA:[id number]  DOB - 04/11/1956  Chief Complaint  Patient presents with   Hospitalization Follow-up       Subjective:   Krystal Allen is a 67 y.o. female here today for a follow up visit After ED visit 4/26 for poor circulation then hospitalization hospitalization 4/28-4/30/2024 for allergic reaction.  She says she is feeling much better.  She says I need to refer her to [facility] [place] for her allergies, the vein specialists for poor circulation in her hands and feet, and to the foot doctor for her diabetes.  Does not any med RF.  No new issues or concerns today.  Short with answers.  From discharge summary:   67 year old female w/ history of obesity BMI 41.8, recent diagnosis of diabetes on semaglutide/HLD-on Lipitor/Hypertension on HCTZ, PRN Lasix, hydralazine, CKD stage IIIb, Langerhans cell histiocytosis/interstitial lung disease, unspecified idiopathic urticaria/angioedema presented with onset of swelling that is started in the morning. She went to bed 4/28 without difficulty. Her eyes were normal. When she woke 4/29 am she had swelling of both her eyes, left greater than right. She has not had any fevers, she was admitted for acute allergic reaction.   Acute allergic reaction with facial swelling and neck pain.  She has history of urticaria and follows with an allergist, has a home EpiPen but when the allergic reaction started at home she was unable to take EpiPen due to pain and sensitivity, she was brought to the hospital where she was treated with steroids with almost complete resolution of her symptoms.  Stable procalcitonin no signs of infection.  She feels much better and is eager to be discharged home.  Will be placed on a Medrol Dosepak steroid taper, she has EpiPen at home and has been counseled to take it in the future if needs arises, no clear trigger has  been identified she has an allergist which home she will follow-up within a week of discharge along with PCP.   CKD stage IIIb.  Close to baseline.     DM type II.  Has a insulin monitor, since she is going to be on steroid taper sliding scale short acting insulin provided, insulin teaching prior to discharge.   Hypertension.  Commence home medications.   Langerhan's  cell histiocytosis.  Nighttime oxygen continued.  Outpatient follow-up with PCP.   Principal Problem:   Chemosis of conjunctiva, bilateral Active Problems:   Hypertension associated with type 2 diabetes mellitus (HCC)   Obesity (BMI 30-39.9)   Type 2 diabetes mellitus with chronic kidney disease, without long-term current use of insulin (HCC)   CKD stage 3b, GFR 30-44 ml/min (HCC) - Baseline Scr 1.5   Langerhan's cell histiocytosis (HCC)  No problems updated.  ALLERGIES: Allergies  Allergen Reactions   Sulfa Antibiotics Anaphylaxis and Hives   Chantix [Varenicline Tartrate]     Bad dreams   Fish Allergy Other (See Comments)    Severe stomach cramps    Hygroton [Chlorthalidone]     Swelling or hives   Penicillins Other (See Comments)    Pt took Amoxicillin in 2019 without issue, reports PCN caused rash in the past   Latex Rash and Other (See Comments)    Dry skin    PAST MEDICAL HISTORY: Past Medical History:  Diagnosis Date   Allergy    COPD (chronic obstructive pulmonary disease) (  HCC)    Depression    GERD (gastroesophageal reflux disease)    Hernia, abdominal    History of chicken pox    Hyperlipidemia    Hypertension    Internal hemorrhoids with Grade 3 prolapse and bleeding 06/19/2013   Osteoarthritis    Personal history of colonic adenomas 06/25/2013    MEDICATIONS AT HOME: Prior to Admission medications   Medication Sig Start Date End Date Taking? Authorizing Provider  Accu-Chek Softclix Lancets lancets Use to check blood sugar three times daily. E11.69 02/24/23  Yes Marcine Matar, MD   acetaminophen (TYLENOL 8 HOUR) 650 MG CR tablet Take 1 tablet (650 mg total) by mouth 2 (two) times daily as needed for pain. 04/06/17  Yes Funches, Josalyn, MD  albuterol (PROAIR HFA) 108 (90 Base) MCG/ACT inhaler Inhale 2 puffs into the lungs every 4 (four) hours as needed for wheezing or shortness of breath. 02/15/23  Yes Marcine Matar, MD  albuterol (PROVENTIL) (2.5 MG/3ML) 0.083% nebulizer solution TAKE 3 MLS BY NEBULIZATION EVERY 6 (SIX) HOURS AS NEEDED FOR WHEEZING OR SHORTNESS OF BREATH. 10/03/22  Yes Marcine Matar, MD  aspirin EC 81 MG tablet Take 1 tablet (81 mg total) by mouth daily. 05/12/20  Yes Marcine Matar, MD  atorvastatin (LIPITOR) 80 MG tablet Take 1 tablet (80 mg total) by mouth daily. 05/11/22  Yes Orbie Pyo, MD  Blood Glucose Monitoring Suppl (ACCU-CHEK GUIDE) w/Device KIT Use to check blood sugar three times daily. E11.69 02/24/23  Yes Marcine Matar, MD  cetirizine (ZYRTEC) 10 MG tablet Take 1 tablet (10 mg total) by mouth daily. 02/07/22  Yes Mayers, Cari S, PA-C  Continuous Glucose Receiver (FREESTYLE LIBRE 2 READER) DEVI Use to check blood sugar continuously throughout the day. Change sensors once every 14 days. 02/20/23  Yes Marcine Matar, MD  Continuous Glucose Sensor (FREESTYLE LIBRE 2 SENSOR) MISC Use to check blood sugar continuously throughout the day. 02/20/23  Yes Marcine Matar, MD  diphenhydrAMINE (BENADRYL) 25 mg capsule Take 25-50 mg by mouth every 6 (six) hours as needed for itching or allergies.   Yes [provider]  Elastic Bandages & Supports (MEDICAL COMPRESSION STOCKINGS) MISC R60.0 01/25/22  Yes Claiborne Rigg, NP  EPINEPHrine 0.3 mg/0.3 mL IJ SOAJ injection Inject 0.3 mg into the muscle as needed for anaphylaxis. 10/26/22  Yes Marcine Matar, MD  Evolocumab (REPATHA SURECLICK) 140 MG/ML SOAJ Inject 140 mg into the skin every 14 (fourteen) days. 10/06/22  Yes Hilty, Lisette Abu, MD  ezetimibe (ZETIA) 10 MG tablet Take 1  tablet (10 mg total) by mouth daily. 10/03/22  Yes Orbie Pyo, MD  famotidine (PEPCID) 20 MG tablet Take 1 tablet (20 mg total) by mouth 2 (two) times daily as needed (hives). 12/02/22  Yes Birder Robson, MD  fexofenadine [facility] ALLERGY) 180 MG tablet Take 1 tablet (180 mg total) by mouth 2 (two) times daily as needed (hives). 12/02/22  Yes Birder Robson, MD  fluticasone (FLONASE) 50 MCG/ACT nasal spray Place 2 sprays into both nostrils daily. 12/02/22  Yes Patel, Ellen Henri, MD  fluticasone furoate-vilanterol (BREO ELLIPTA) 200-25 MCG/ACT AEPB Inhale 1 puff into the lungs once daily. 11/03/22  Yes Luciano Cutter, MD  furosemide (LASIX) 20 MG tablet Take 1 tablet (20 mg total) by mouth daily as needed (for weight gain of 2-3 lbs overnight or swelling in legs/ankles). 02/23/23  Yes Alver Sorrow, NP  glucose blood (ACCU-CHEK GUIDE)  test strip Use to check blood sugar three times daily. E11.69 02/24/23  Yes Marcine Matar, MD  hydrALAZINE (APRESOLINE) 10 MG tablet Take 1 tablet (10 mg total) by mouth 2 (two) times daily. 02/01/23  Yes Marcine Matar, MD  hydrochlorothiazide (HYDRODIURIL) 25 MG tablet Take 1 tablet (25 mg total) by mouth daily. 02/01/23  Yes Marcine Matar, MD  hydrOXYzine (ATARAX) 25 MG tablet Take 1 tablet (25 mg total) by mouth every 8 (eight) hours as needed for up to 15 doses for itching. 08/10/22  Yes Trifan, Kermit Balo, MD  insulin lispro (HUMALOG KWIKPEN) 100 UNIT/ML KwikPen Before each meal 3 times a day, 140-199 - 2 units, 200-250 - 4 units, 251-299 - 6 units,  300-349 - 8 units,  350 or above 10 units. 02/28/23  Yes Leroy Sea, MD  Insulin Pen Needle 32G X 4 MM MISC Use 4 times daily with insulin pen. 02/28/23  Yes Leroy Sea, MD  methylPREDNISolone (MEDROL DOSEPAK) 4 MG TBPK tablet Follow package directions 02/28/23  Yes Leroy Sea, MD  Olopatadine HCl 0.2 % SOLN Place 1 drop into both eyes daily as needed (allergies). 12/02/22  Yes Birder Robson, MD   omeprazole (PRILOSEC) 20 MG capsule Take 1 capsule (20 mg total) by mouth daily. 09/30/22  Yes Marcine Matar, MD  oxybutynin (DITROPAN XL) 5 MG 24 hr tablet Take 1 tablet (5 mg total) by mouth at bedtime. Patient taking differently: Take 5 mg by mouth daily as needed (for urinary incontinence). 02/02/23  Yes Freddy Finner, NP  potassium chloride SA (KLOR-CON M) 20 MEQ tablet Take 2 tablets ( total) by mouth every morning and 1 tablet ( total) every evening for the next 3 days (02/24/23, 02/25/23, and 02/26/23). Starting on 02/27/23 take 1 tablet by mouth twice daily. 02/23/23  Yes Alver Sorrow, NP  Respiratory Therapy Supplies (FLUTTER) DEVI Use as directed 12/01/17  Yes Mannam, Praveen, MD  Semaglutide,0.25 or 0.5MG /DOS, 2 MG/3ML SOPN Inject 0.25 mg into the skin once a week. 02/20/23  Yes Marcine Matar, MD  sodium chloride (OCEAN) 0.65 % nasal spray Place 2 sprays into both nostrils every 4 (four) hours. Patient taking differently: Place 2 sprays into the nose every 4 (four) hours as needed for congestion. 08/20/21  Yes Gwenevere Abbot, MD  Tiotropium Bromide Monohydrate (SPIRIVA RESPIMAT) 2.5 MCG/ACT AERS Inhale 2 puffs into the lungs daily. 11/03/22  Yes Luciano Cutter, MD    ROS: Neg HEENT Neg resp Neg cardiac Neg GI Neg GU Neg psych Neg neuro  Objective:   Vitals:   03/06/23 1027  BP: 119/78  Pulse: 64  SpO2: 98%  Weight: 259 lb 9.6 oz (117.8 kg)   Exam General appearance : Awake, alert, not in any distress. Speech Clear. Not toxic looking HEENT: Atraumatic and Normocephalic Neck: Supple, no JVD. No cervical lymphadenopathy. Chemosis resolved Chest: Good air entry bilaterally, CTAB.  No rales/rhonchi/wheezing CVS: S1 S2 regular, no murmurs.  Extremities: B/L Lower Ext shows no edema, both legs are warm to touch.  No visible discoloration of hands or feet.   Neurology: Awake alert, and oriented X 3, CN II-XII intact, Non focal Skin: No Rash  Data  Review Lab Results  Component Value Date   HGBA1C 6.8 (H) 02/27/2023   HGBA1C 6.7 02/20/2023   HGBA1C 6.4 10/17/2022    Assessment & Plan   1. Poor peripheral circulation - Ambulatory referral to Vascular Surgery  2. Allergic reaction,  subsequent encounter I placed a referral to [facility] per her request but it appears she has an appt with a [facility] in august - Ambulatory referral to Allergy  3. Type 2 diabetes mellitus with hyperglycemia, unspecified whether long term insulin use (HCC) A1C=6.8 1 week ago.  Continue current regimen and work on diet - Glucose (CBG) - Ambulatory referral to Podiatry  4. Hospital discharge follow-up  5. Langerhan's cell histiocytosis (HCC) On home O2   Return for PCP appt in June.  The patient was given clear instructions to go to ER or return to medical center if symptoms don't improve, worsen or new problems develop. The patient verbalized understanding. The patient was told to call to get lab results if they haven't heard anything in the next week.      [place]ian Co, PA-C [facility] [place], [place] [phone]   03/06/2023, 11:04 AM

## 2023-03-09 ENCOUNTER — Other Ambulatory Visit (HOSPITAL_COMMUNITY): Payer: Self-pay

## 2023-03-09 ENCOUNTER — Other Ambulatory Visit: Payer: Self-pay

## 2023-03-09 ENCOUNTER — Other Ambulatory Visit: Payer: Self-pay | Admitting: Internal Medicine

## 2023-03-09 MED ORDER — ALBUTEROL SULFATE HFA 108 (90 BASE) MCG/ACT IN AERS
2.0000 | INHALATION_SPRAY | RESPIRATORY_TRACT | 0 refills | Status: DC | PRN
  Filled 2023-03-09: qty 6.7, 16d supply, fill #0

## 2023-03-09 NOTE — Telephone Encounter (Signed)
Requested Prescriptions  Pending Prescriptions Disp Refills   albuterol (PROAIR HFA) 108 (90 Base) MCG/ACT inhaler 6.7 g 0    Sig: Inhale 2 puffs into the lungs every 4 (four) hours as needed for wheezing or shortness of breath.     Pulmonology:  Beta Agonists 2 Passed - 03/09/2023 10:49 AM      Passed - Last BP in normal range    BP Readings from Last 1 Encounters:  03/06/23 119/78         Passed - Last Heart Rate in normal range    Pulse Readings from Last 1 Encounters:  03/06/23 64         Passed - Valid encounter within last 12 months    Recent Outpatient Visits           3 days ago Type 2 diabetes mellitus with hyperglycemia, unspecified whether long term insulin use [facility])   [place] [place], [place], [place]   2 weeks ago Type 2 diabetes mellitus with morbid obesity [facility])   [place] [facility] Jonah Blue B, MD   4 months ago Obesity, morbid, BMI 40.0-49.9 [facility])   [place] [facility] Marcine Matar, MD   6 months ago Encounter for [employer] annual wellness exam   [facility] Marcine Matar, MD   6 months ago URI, acute   [place] [facility] Marcine Matar, MD       Future Appointments             In 3 weeks Marcine Matar, MD [facility]   In 1 month Everardo All, [place], MD [facility] Pulmonary at [facility], [place]   In 1 month Hilty, Lisette Abu, MD [facility] HeartCare at [facility]   In 2 months Birder Robson, MD  [facility]

## 2023-03-10 ENCOUNTER — Ambulatory Visit: Payer: 59 | Attending: Cardiovascular Disease

## 2023-03-10 DIAGNOSIS — N183 Chronic kidney disease, stage 3 unspecified: Secondary | ICD-10-CM | POA: Diagnosis not present

## 2023-03-10 DIAGNOSIS — I10 Essential (primary) hypertension: Secondary | ICD-10-CM | POA: Diagnosis not present

## 2023-03-10 DIAGNOSIS — Z79899 Other long term (current) drug therapy: Secondary | ICD-10-CM

## 2023-03-10 DIAGNOSIS — E876 Hypokalemia: Secondary | ICD-10-CM

## 2023-03-10 LAB — BASIC METABOLIC PANEL
BUN/Creatinine Ratio: 17 (ref 12–28)
BUN: 29 mg/dL — ABNORMAL HIGH (ref 8–27)
CO2: 24 mmol/L (ref 20–29)
Calcium: 9.4 mg/dL (ref 8.7–10.3)
Chloride: 99 mmol/L (ref 96–106)
Creatinine, Ser: 1.72 mg/dL — ABNORMAL HIGH (ref 0.57–1.00)
Glucose: 89 mg/dL (ref 70–99)
Potassium: 3.9 mmol/L (ref 3.5–5.2)
Sodium: 139 mmol/L (ref 134–144)
eGFR: 32 mL/min/{1.73_m2} — ABNORMAL LOW (ref >59)

## 2023-03-13 ENCOUNTER — Other Ambulatory Visit: Payer: Self-pay

## 2023-03-15 ENCOUNTER — Other Ambulatory Visit: Payer: Self-pay

## 2023-03-18 ENCOUNTER — Other Ambulatory Visit: Payer: Self-pay | Admitting: Internal Medicine

## 2023-03-18 DIAGNOSIS — I10 Essential (primary) hypertension: Secondary | ICD-10-CM

## 2023-03-20 ENCOUNTER — Other Ambulatory Visit (HOSPITAL_COMMUNITY): Payer: Self-pay

## 2023-03-20 ENCOUNTER — Other Ambulatory Visit: Payer: Self-pay | Admitting: *Deleted

## 2023-03-20 ENCOUNTER — Other Ambulatory Visit: Payer: Self-pay

## 2023-03-20 DIAGNOSIS — M79604 Pain in right leg: Secondary | ICD-10-CM

## 2023-03-20 MED ORDER — HYDRALAZINE HCL 10 MG PO TABS
10.0000 mg | ORAL_TABLET | Freq: Two times a day (BID) | ORAL | 1 refills | Status: DC
Start: 2023-03-20 — End: 2023-08-18
  Filled 2023-03-20: qty 180, 90d supply, fill #0
  Filled 2023-06-16: qty 180, 90d supply, fill #1

## 2023-03-20 MED ORDER — HYDROCHLOROTHIAZIDE 25 MG PO TABS
25.0000 mg | ORAL_TABLET | Freq: Every day | ORAL | 0 refills | Status: DC
Start: 2023-03-20 — End: 2023-04-13
  Filled 2023-03-20: qty 30, 30d supply, fill #0

## 2023-03-20 NOTE — Telephone Encounter (Signed)
Requested Prescriptions  Pending Prescriptions Disp Refills   hydrochlorothiazide (HYDRODIURIL) 25 MG tablet 30 tablet 0    Sig: Take 1 tablet (25 mg total) by mouth daily.     Cardiovascular: Diuretics - Thiazide Failed - 03/18/2023  9:54 AM      Failed - Cr in normal range and within 180 days    Creat  Date Value Ref Range Status  01/15/2016 1.22 (H) 0.50 - 0.99 mg/dL Final   Creatinine, Ser  Date Value Ref Range Status  03/10/2023 1.72 (H) 0.57 - 1.00 mg/dL Final         Passed - K in normal range and within 180 days    Potassium  Date Value Ref Range Status  03/10/2023 3.9 3.5 - 5.2 mmol/L Final         Passed - Na in normal range and within 180 days    Sodium  Date Value Ref Range Status  03/10/2023 139 134 - 144 mmol/L Final         Passed - Last BP in normal range    BP Readings from Last 1 Encounters:  03/06/23 119/78         Passed - Valid encounter within last 6 months    Recent Outpatient Visits           2 weeks ago Type 2 diabetes mellitus with hyperglycemia, unspecified whether long term insulin use [facility])   [place] [facility] [place], [place], [place]   4 weeks ago Type 2 diabetes mellitus with morbid obesity [facility])   [facility] Jonah Blue B, MD   5 months ago Obesity, morbid, BMI 40.0-49.9 [facility])   [place] [facility] Marcine Matar, MD   7 months ago Encounter for [employer] annual wellness exam   [facility] Marcine Matar, MD   7 months ago URI, acute   [facility] Marcine Matar, MD       Future Appointments             In 2 weeks [place], [place], [place] [facility] at [place], [place]   In 2 weeks Marcine Matar, MD [facility]   In 3 weeks Luciano Cutter, MD [facility] at [facility], [place]   In 1 month Hilty, Lisette Abu, MD [facility] at [facility]   In 2 months Birder Robson, MD [facility] at [facility]

## 2023-03-21 ENCOUNTER — Other Ambulatory Visit: Payer: Self-pay

## 2023-03-22 ENCOUNTER — Other Ambulatory Visit (HOSPITAL_COMMUNITY): Payer: Self-pay

## 2023-03-24 DIAGNOSIS — R0602 Shortness of breath: Secondary | ICD-10-CM | POA: Diagnosis not present

## 2023-03-30 ENCOUNTER — Ambulatory Visit (HOSPITAL_COMMUNITY)
Admission: RE | Admit: 2023-03-30 | Discharge: 2023-03-30 | Disposition: A | Discharge status: Home / Self Care | Payer: 59 | Source: Ambulatory Visit | Attending: Vascular Surgery | Admitting: Vascular Surgery

## 2023-03-30 DIAGNOSIS — M79604 Pain in right leg: Secondary | ICD-10-CM | POA: Diagnosis not present

## 2023-03-30 DIAGNOSIS — M79605 Pain in left leg: Secondary | ICD-10-CM | POA: Diagnosis not present

## 2023-03-30 LAB — VAS US ABI WITH/WO TBI
Left ABI: 0.95
Right ABI: 1.03

## 2023-03-30 NOTE — Progress Notes (Signed)
Office Note     CC: Episodic bluish discoloration of the feet Requesting Provider:  Anders Simmonds, PA-C  HPI: Krystal Allen is a 67 y.o. (May 26, 1956) female presenting at the request of .Marcine Matar, MD for evaluation of episodic bluish discoloration of the feet.  On exam, Krystal Allen was doing well.  Her Allen was present via FaceTime.  Roughly 1 month ago, Krystal Allen appreciated bluish discoloration of her feet and hands.  This was not associated with any pain, and spontaneously resolved, once the patient stood up and ambulated.  The episode occurred again, involving the plantar aspect of the feet prompting evaluation by her primary care.  Cardiac workup included CT angio abdomen pelvis with runoff demonstrating atherosclerotic disease in the left iliac system.  She presents to discuss these findings, and for peripheral arterial disease evaluation.  Krystal Allen denies symptoms of ischemic rest pain, tissue loss.  She notes some tightness in her thighs after ambulating, which resolves with rest.  The episodic bluish discoloration has not occurred again.  She symptoms associated with Raynaud's disease.   The pt is  on a statin for cholesterol management.  The pt is  on a daily aspirin.   Other AC:  - The pt is  on medication for hypertension.   The pt is  diabetic.  Tobacco hx:  former  Past Medical History:  Diagnosis Date   Allergy    COPD (chronic obstructive pulmonary disease) (HCC)    Depression    GERD (gastroesophageal reflux disease)    Hernia, abdominal    History of chicken pox    Hyperlipidemia    Hypertension    Internal hemorrhoids with Grade 3 prolapse and bleeding 06/19/2013   Osteoarthritis    Personal history of colonic adenomas 06/25/2013    Past Surgical History:  Procedure Laterality Date   ABDOMINAL HYSTERECTOMY  31yrs ago   due to heavy bleeding and fibroids    COLONOSCOPY     HEMORRHOID BANDING  2014   HEMORRHOID SURGERY  30yrs agao    RIGHT HEART CATH N/A 10/12/2022   Procedure: RIGHT HEART CATH;  Surgeon: Orbie Pyo, MD;  Location: [facility];  Service: Cardiovascular;  Laterality: N/A;   UMBILICAL HERNIA REPAIR  06/2015    Social History   Socioeconomic History   Marital status: Married    Spouse name: Not on file   Number of children: Not on file   Years of education: 12+   Highest education level: Not on file  Occupational History   Occupation: PCA    Employer: [employer]  Tobacco Use   Smoking status: Former    Packs/day: 0.50    Years: 40.00    Additional pack years: 0.00    Total pack years: 20.00    Types: Cigarettes    Quit date: 07/2021    Years since quitting: 1.6    Passive exposure: Never   Smokeless tobacco: Never   Tobacco comments:    Quit [id number]  Vaping Use   Vaping Use: Former  Substance and Sexual Activity   Alcohol use: Yes    Comment: occasional   Drug use: No   Sexual activity: Not Currently  Other Topics Concern   Not on file  Social History Narrative   Regular exercise-no Caffeine Use-   Social Determinants of Health   Financial Resource Strain: Low Risk  (08/19/2022)   Overall Financial Resource Strain (CARDIA)    Difficulty of Paying Living Expenses: Not  hard at all  Food Insecurity: No Food Insecurity (03/02/2023)   Hunger Vital Sign    Worried About Running Out of Food in the Last Year: Never true    Ran Out of Food in the Last Year: Never true  Transportation Needs: No Transportation Needs (03/02/2023)   PRAPARE - Administrator, Civil Service (Medical): No    Lack of Transportation (Non-Medical): No  Physical Activity: Inactive (08/19/2022)   Exercise Vital Sign    Days of Exercise per Week: 1 day    Minutes of Exercise per Session: 0 min  Stress: No Stress Concern Present (08/19/2022)   [employer] of Occupational Health - Occupational Stress Questionnaire    Feeling of Stress : Not at all  Social Connections: Socially Isolated  (08/19/2022)   Social Connection and Isolation Panel [NHANES]    Frequency of Communication with Friends and Family: More than three times a week    Frequency of Social Gatherings with Friends and Family: Three times a week    Attends Religious Services: Never    Active Member of Clubs or Organizations: No    Attends Banker Meetings: Never    Marital Status: Separated  Intimate Partner Violence: Not At Risk (08/19/2022)   Humiliation, Afraid, Rape, and Kick questionnaire    Fear of Current or Ex-Partner: No    Emotionally Abused: No    Physically Abused: No    Sexually Abused: No   Family History  Problem Relation Age of Onset   Cancer Father        prostate   Hypertension Mother    Diabetes Mother    Hypertension Sister    Heart disease Maternal Uncle    Stroke Maternal Grandmother    Hypertension Maternal Grandmother    Hypertension Sister    Colon cancer Neg Hx    Stomach cancer Neg Hx    Rectal cancer Neg Hx    Colon polyps Neg Hx     Current Outpatient Medications  Medication Sig Dispense Refill   Accu-Chek Softclix Lancets lancets Use to check blood sugar three times daily. E11.69 100 each 6   acetaminophen (TYLENOL 8 HOUR) 650 MG CR tablet Take 1 tablet (650 mg total) by mouth 2 (two) times daily as needed for pain. 60 tablet 5   albuterol (PROAIR HFA) 108 (90 Base) MCG/ACT inhaler Inhale 2 puffs into the lungs every 4 (four) hours as needed for wheezing or shortness of breath. 6.7 g 0   albuterol (PROVENTIL) (2.5 MG/3ML) 0.083% nebulizer solution TAKE 3 MLS BY NEBULIZATION EVERY 6 (SIX) HOURS AS NEEDED FOR WHEEZING OR SHORTNESS OF BREATH. 90 mL 1   aspirin EC 81 MG tablet Take 1 tablet (81 mg total) by mouth daily. 100 tablet 1   atorvastatin (LIPITOR) 80 MG tablet Take 1 tablet (80 mg total) by mouth daily. 90 tablet 3   Blood Glucose Monitoring Suppl (ACCU-CHEK GUIDE) w/Device KIT Use to check blood sugar three times daily. E11.69 1 kit 0   cetirizine  (ZYRTEC) 10 MG tablet Take 1 tablet (10 mg total) by mouth daily. 30 tablet 11   Continuous Glucose Receiver (FREESTYLE LIBRE 2 READER) DEVI Use to check blood sugar continuously throughout the day. Change sensors once every 14 days. 1 each 0   Continuous Glucose Sensor (FREESTYLE LIBRE 2 SENSOR) MISC Use to check blood sugar continuously throughout the day. 2 each 6   diphenhydrAMINE (BENADRYL) 25 mg capsule Take 25-50 mg by mouth every  6 (six) hours as needed for itching or allergies.     Elastic Bandages & Supports (MEDICAL COMPRESSION STOCKINGS) MISC R60.0 1 each 0   EPINEPHrine 0.3 mg/0.3 mL IJ SOAJ injection Inject 0.3 mg into the muscle as needed for anaphylaxis. 2 each 2   Evolocumab (REPATHA SURECLICK) 140 MG/ML SOAJ Inject 140 mg into the skin every 14 (fourteen) days. 6 mL 3   ezetimibe (ZETIA) 10 MG tablet Take 1 tablet (10 mg total) by mouth daily. 90 tablet 3   famotidine (PEPCID) 20 MG tablet Take 1 tablet (20 mg total) by mouth 2 (two) times daily as needed (hives). 60 tablet 5   fexofenadine (ALLEGRA ALLERGY) 180 MG tablet Take 1 tablet (180 mg total) by mouth 2 (two) times daily as needed (hives). 60 tablet 5   fluticasone (FLONASE) 50 MCG/ACT nasal spray Place 2 sprays into both nostrils daily. 16 g 5   fluticasone furoate-vilanterol (BREO ELLIPTA) 200-25 MCG/ACT AEPB Inhale 1 puff into the lungs once daily. 60 each 5   furosemide (LASIX) 20 MG tablet Take 1 tablet (20 mg total) by mouth daily as needed (for weight gain of 2-3 lbs overnight or swelling in legs/ankles).     glucose blood (ACCU-CHEK GUIDE) test strip Use to check blood sugar three times daily. E11.69 100 each 12   hydrALAZINE (APRESOLINE) 10 MG tablet Take 1 tablet (10 mg total) by mouth 2 (two) times daily. 180 tablet 1   hydrochlorothiazide (HYDRODIURIL) 25 MG tablet Take 1 tablet (25 mg total) by mouth daily. 30 tablet 0   hydrOXYzine (ATARAX) 25 MG tablet Take 1 tablet (25 mg total) by mouth every 8 (eight) hours  as needed for up to 15 doses for itching. 15 tablet 0   insulin lispro (HUMALOG KWIKPEN) 100 UNIT/ML KwikPen Before each meal 3 times a day, 140-199 - 2 units, 200-250 - 4 units, 251-299 - 6 units,  300-349 - 8 units,  350 or above 10 units. 15 mL 0   Insulin Pen Needle 32G X 4 MM MISC Use 4 times daily with insulin pen. 100 each 0   methylPREDNISolone (MEDROL DOSEPAK) 4 MG TBPK tablet Follow package directions 21 tablet 0   Olopatadine HCl 0.2 % SOLN Place 1 drop into both eyes daily as needed (allergies). 2.5 mL 5   omeprazole (PRILOSEC) 20 MG capsule Take 1 capsule (20 mg total) by mouth daily. 90 capsule 3   oxybutynin (DITROPAN XL) 5 MG 24 hr tablet Take 1 tablet (5 mg total) by mouth at bedtime. (Patient taking differently: Take 5 mg by mouth daily as needed (for urinary incontinence).) 30 tablet 0   potassium chloride SA (KLOR-CON M) 20 MEQ tablet Take 2 tablets ( total) by mouth every morning and 1 tablet ( total) every evening for the next 3 days (02/24/23, 02/25/23, and 02/26/23). Starting on 02/27/23 take 1 tablet by mouth twice daily.     Respiratory Therapy Supplies (FLUTTER) DEVI Use as directed 1 each 0   Semaglutide,0.25 or 0.5MG /DOS, 2 MG/3ML SOPN Inject 0.25 mg into the skin once a week. 3 mL 1   sodium chloride (OCEAN) 0.65 % nasal spray Place 2 sprays into both nostrils every 4 (four) hours. (Patient taking differently: Place 2 sprays into the nose every 4 (four) hours as needed for congestion.) 44 mL 0   Tiotropium Bromide Monohydrate (SPIRIVA RESPIMAT) 2.5 MCG/ACT AERS Inhale 2 puffs into the lungs daily. 4 g 5   No current facility-administered medications for this  visit.    Allergies  Allergen Reactions   Sulfa Antibiotics Anaphylaxis and Hives   Chantix [Varenicline Tartrate]     Bad dreams   Fish Allergy Other (See Comments)    Severe stomach cramps    Hygroton [Chlorthalidone]     Swelling or hives   Penicillins Other (See Comments)    Pt took  Amoxicillin in 2019 without issue, reports PCN caused rash in the past   Latex Rash and Other (See Comments)    Dry skin     REVIEW OF SYSTEMS:  [X]  denotes positive finding, [ ]  denotes negative finding Cardiac  Comments:  Chest pain or chest pressure:    Shortness of breath upon exertion:    Short of breath when lying flat:    Irregular heart rhythm:        Vascular    Pain in calf, thigh, or hip brought on by ambulation:    Pain in feet at night that wakes you up from your sleep:     Blood clot in your veins:    Leg swelling:         Pulmonary    Oxygen at home:    Productive cough:     Wheezing:         Neurologic    Sudden weakness in arms or legs:     Sudden numbness in arms or legs:     Sudden onset of difficulty speaking or slurred speech:    Temporary loss of vision in one eye:     Problems with dizziness:         Gastrointestinal    Blood in stool:     Vomited blood:         Genitourinary    Burning when urinating:     Blood in urine:        Psychiatric    Major depression:         Hematologic    Bleeding problems:    Problems with blood clotting too easily:        Skin    Rashes or ulcers:        Constitutional    Fever or chills:      PHYSICAL EXAMINATION:  There were no vitals filed for this visit.  General:  WDWN in NAD; vital signs documented above Gait: Not observed HENT: WNL, normocephalic Pulmonary: normal non-labored breathing , without wheezing Cardiac: regular HR Abdomen: soft, NT, no masses Skin: without rashes Vascular Exam/Pulses:  Right Left  Radial 2+ (normal) 2+ (normal)  Ulnar    Femoral    Popliteal    DP 2+ (normal) 2+ (normal)  PT 2+ (normal) 2+ (normal)   Extremities: without ischemic changes, without Gangrene , without cellulitis; without open wounds;  Musculoskeletal: no muscle wasting or atrophy  Neurologic: A&O X 3;  No focal weakness or paresthesias are detected Psychiatric:  The pt has Normal  affect.   Non-Invasive Vascular Imaging:   ABI Findings:  +---------+------------------+-----+---------+--------+  Right   Rt Pressure (mmHg)IndexWaveform Comment   +---------+------------------+-----+---------+--------+  Brachial 117                                       +---------+------------------+-----+---------+--------+  PTA     120               1.03 biphasic           +---------+------------------+-----+---------+--------+  DP      104               0.89 triphasic          +---------+------------------+-----+---------+--------+  Great Toe73                0.62                    +---------+------------------+-----+---------+--------+   +---------+------------------+-----+--------+-------+  Left    Lt Pressure (mmHg)IndexWaveformComment  +---------+------------------+-----+--------+-------+  Brachial 117                                     +---------+------------------+-----+--------+-------+  PTA     111               0.95 biphasic         +---------+------------------+-----+--------+-------+  DP      89                0.76 biphasic         +---------+------------------+-----+--------+-------+  Great Toe66                0.56                  +---------+------------------+-----+--------+-------+   +-------+-----------+-----------+------------+------------+  ABI/TBIToday's ABIToday's TBIPrevious ABIPrevious TBI  +-------+-----------+-----------+------------+------------+  Right 1.03       0.62       1.04                      +-------+-----------+-----------+------------+------------+  Left  0.95       0.56       1.03                      +-------+-----------+-----------+------------+------------+      Previous ABI on 04/10/17 at [facility].    Summary:  Right: Resting right ankle-brachial index is within normal range. The  right toe-brachial index is abnormal.   Left: Resting left  ankle-brachial index is within normal range. The left  toe-brachial index is abnormal.     ASSESSMENT/PLAN: Krystal Allen is a 67 y.o. female presenting with episodic bluish discoloration of the feet.  Symptoms are not consistent with Raynaud's.  Unfortunately, she really does not have any pictures to evaluate.  I am less concerned as there were no symptoms associated.  CT angio abdomen pelvis was evaluated demonstrating mild to moderate calcific disease in the left iliac system.  ABI was reviewed and appeared normal.  Krystal Allen does have a decreased toe pressure likely due to diabetes, which can lead to cyanosis.  I had a long conversation with Krystal Allen and her Allen regarding the above.  Arterial flow to the level of the ankle is normal.  She does have some small vessel disease in the foot . She is on best medical therapy for peripheral arterial disease.  I do not have an etiology for the transient cyanosis appreciated in the feet, but I am happy that this resolved, and that Krystal Allen was asymptomatic.  In some cases, this can be idiopathic.  I asked her to take a picture of her feet should this happen again, and that I am happy to see her again in the office should she have any concerns.     Victorino Sparrow, MD [facility] [phone]

## 2023-03-31 ENCOUNTER — Encounter: Payer: Self-pay | Admitting: Vascular Surgery

## 2023-03-31 ENCOUNTER — Ambulatory Visit (INDEPENDENT_AMBULATORY_CARE_PROVIDER_SITE_OTHER): Payer: 59 | Admitting: Vascular Surgery

## 2023-03-31 VITALS — BP 129/82 | HR 72 | Temp 97.7°F | Resp 20 | Ht 66.0 in | Wt 256.0 lb

## 2023-03-31 DIAGNOSIS — R230 Cyanosis: Secondary | ICD-10-CM | POA: Diagnosis not present

## 2023-04-03 ENCOUNTER — Ambulatory Visit (INDEPENDENT_AMBULATORY_CARE_PROVIDER_SITE_OTHER): Payer: 59 | Admitting: Podiatry

## 2023-04-03 ENCOUNTER — Encounter: Payer: Self-pay | Admitting: Podiatry

## 2023-04-03 DIAGNOSIS — E1122 Type 2 diabetes mellitus with diabetic chronic kidney disease: Secondary | ICD-10-CM

## 2023-04-03 DIAGNOSIS — M79675 Pain in left toe(s): Secondary | ICD-10-CM

## 2023-04-03 DIAGNOSIS — F419 Anxiety disorder, unspecified: Secondary | ICD-10-CM | POA: Insufficient documentation

## 2023-04-03 DIAGNOSIS — B351 Tinea unguium: Secondary | ICD-10-CM | POA: Diagnosis not present

## 2023-04-03 DIAGNOSIS — M79674 Pain in right toe(s): Secondary | ICD-10-CM

## 2023-04-03 DIAGNOSIS — N1832 Chronic kidney disease, stage 3b: Secondary | ICD-10-CM

## 2023-04-03 DIAGNOSIS — Z8659 Personal history of other mental and behavioral disorders: Secondary | ICD-10-CM | POA: Insufficient documentation

## 2023-04-03 DIAGNOSIS — K439 Ventral hernia without obstruction or gangrene: Secondary | ICD-10-CM | POA: Insufficient documentation

## 2023-04-03 NOTE — Progress Notes (Signed)
  Subjective:  Patient ID: Krystal Allen, female    DOB: 04/23/1956,   MRN: [id number]  Chief Complaint  Patient presents with   Diabetes    Foot Exam - no complaints, sent by PCP, last A1c was 6.8   New Patient (Initial Visit)    67 y.o. female presents for diabetic foot exam. Denies any major concerns with her feet. . Denies burning and tingling in their feet. Patient is diabetic and last A1c was  Lab Results  Component Value Date   HGBA1C 6.8 (H) 02/27/2023   .   PCP:  Marcine Matar, MD    . Denies any other pedal complaints. Denies n/v/f/c.   Past Medical History:  Diagnosis Date   Allergy    COPD (chronic obstructive pulmonary disease) (HCC)    Depression    GERD (gastroesophageal reflux disease)    Hernia, abdominal    History of chicken pox    Hyperlipidemia    Hypertension    Internal hemorrhoids with Grade 3 prolapse and bleeding 06/19/2013   Osteoarthritis    Personal history of colonic adenomas 06/25/2013    Objective:  Physical Exam: Vascular: DP/PT pulses 2/4 bilateral. CFT <3 seconds. Absent hair growth on digits. Edema noted to bilateral lower extremities. Xerosis noted bilaterally.  Skin. No lacerations or abrasions bilateral feet. Nails 1-5 bilateral  are thickened discolored and elongated with subungual debris.  Musculoskeletal: MMT 5/5 bilateral lower extremities in DF, PF, Inversion and Eversion. Deceased ROM in DF of ankle joint.  Neurological: Sensation intact to light touch. Protective sensation intact bilateral.    Assessment:   1. Pain due to onychomycosis of toenails of both feet   2. Type 2 diabetes mellitus with stage 3b chronic kidney disease, without long-term current use of insulin (HCC)      Plan:  Patient was evaluated and treated and all questions answered. -Discussed and educated patient on diabetic foot care, especially with  regards to the vascular, neurological and musculoskeletal systems.  -Stressed the importance of good  glycemic control and the detriment of not  controlling glucose levels in relation to the foot. -Discussed supportive shoes at all times and checking feet regularly.  -Mechanically debrided all nails 1-5 bilateral using sterile nail nipper and filed with dremel without incident  -Answered all patient questions -Patient to return  in 3 months for at risk foot care -Patient advised to call the office if any problems or questions arise in the meantime.   Louann Sjogren, DPM

## 2023-04-04 ENCOUNTER — Other Ambulatory Visit: Payer: Self-pay

## 2023-04-04 ENCOUNTER — Ambulatory Visit: Payer: 59 | Attending: Internal Medicine | Admitting: Internal Medicine

## 2023-04-04 ENCOUNTER — Encounter: Payer: Self-pay | Admitting: Internal Medicine

## 2023-04-04 VITALS — BP 127/82 | HR 66 | Temp 98.2°F | Ht 66.0 in | Wt 261.0 lb

## 2023-04-04 DIAGNOSIS — E1169 Type 2 diabetes mellitus with other specified complication: Secondary | ICD-10-CM

## 2023-04-04 DIAGNOSIS — L309 Dermatitis, unspecified: Secondary | ICD-10-CM

## 2023-04-04 MED ORDER — FREESTYLE LIBRE 2 SENSOR MISC
6 refills | Status: DC
Start: 2023-04-04 — End: 2024-06-15
  Filled 2023-04-04: qty 2, 28d supply, fill #0
  Filled 2023-04-13 – 2023-04-26 (×2): qty 2, 28d supply, fill #1
  Filled 2023-06-07: qty 2, 28d supply, fill #2
  Filled 2023-07-20: qty 2, 28d supply, fill #3
  Filled 2023-10-14: qty 2, 28d supply, fill #4
  Filled 2023-11-11: qty 2, 28d supply, fill #5
  Filled 2023-12-11: qty 2, 28d supply, fill #6

## 2023-04-04 MED ORDER — TRIAMCINOLONE ACETONIDE 0.1 % EX CREA
1.0000 | TOPICAL_CREAM | Freq: Two times a day (BID) | CUTANEOUS | 0 refills | Status: AC
Start: 2023-04-04 — End: ?
  Filled 2023-04-04 (×2): qty 30, 15d supply, fill #0

## 2023-04-04 MED ORDER — SEMAGLUTIDE(0.25 OR 0.5MG/DOS) 2 MG/3ML ~~LOC~~ SOPN
0.5000 mg | PEN_INJECTOR | SUBCUTANEOUS | 2 refills | Status: DC
Start: 2023-04-04 — End: 2023-07-20
  Filled 2023-04-04: qty 3, fill #0
  Filled 2023-04-13: qty 3, 28d supply, fill #0
  Filled 2023-05-19: qty 3, 28d supply, fill #1
  Filled 2023-06-16: qty 3, 28d supply, fill #2

## 2023-04-04 NOTE — Patient Instructions (Signed)
Increase Ozempic to 0.5 mg once a week.  Please call and schedule appointment with [facility] when you are ready. Please have Dr. Harriette Bouillon send me a report on your eye exam.

## 2023-04-04 NOTE — Progress Notes (Signed)
Patient ID: Krystal Allen, female    DOB: January 08, 1956  MRN: [id number]  CC: Diabetes (DM f/u. Med refill./Rash on L arm X1 week)   Subjective: Krystal Allen is a 67 y.o. female who presents for 6 wks f/u DM.  Patient was upset due to the long wait to be seen today.  Her appointment was at 9:30 AM.  She states that she got here at 9:00.  I got in the room to see her at 10:15 a.m.  Her concerns today include:  Pt with hx of HTN, HL, aortic atherosclerosis, COPD, ILD (pulmonary Langerhans' cell cell histiocytosis), nocturnal hypoxia followed by pulmonary, GERD, CKD 2-3, DM/obesity, former tob dep, DJD c-spine, spinal stenosis, stress incont, adenomatous colon polyp.     I apologized to the patient for the long wait.  DM:  started on Ozempic 0.25 mg once a wk on last visit It has decreased her appetite No side effects.  Weight is up 2 pounds since last visit. Checking BS 3 x/day.  Range 98-114.   Nothing below 80.  Has CGM.  Has used 2 sensors and neither one has stayed on full 2 wks.  If she hits something, it comes off easily.  Was wondering if she can put it on the front of the arm instead of the back. Has appt this wk for DM eye with Dr. Nickolas Madrid  C/o rash on LT arm around elbow x several days.   No initiating factors.  Saw physician assistant last month posthospital discharge for angioedema around the eyes.  She had requested a referral to [facility] neurology clinic.  Did received letter from [facility].  Has to call to schedule the appt.   Patient Active Problem List   Diagnosis Date Noted   Anxiety 04/03/2023   History of panic attacks 04/03/2023   Ventral hernia 04/03/2023   Chemosis of conjunctiva, bilateral 02/27/2023   Type 2 diabetes mellitus with chronic kidney disease, without long-term current use of insulin (HCC) 02/27/2023   CKD stage 3b, GFR 30-44 ml/min (HCC) - Baseline Scr 1.5 02/27/2023   Morbid obesity (HCC) 03/09/2022   Seasonal allergies 02/07/2022   Mastitis, right,  acute 12/23/2021   Influenza vaccine needed 09/14/2020   Tobacco dependence 05/12/2020   Aortic atherosclerosis (HCC) 05/12/2020   Adenomatous polyp of colon 05/12/2020   Langerhan's cell histiocytosis (HCC) 01/26/2018   Cigarette smoker 01/26/2018   Osteoarthritis of cervical spine 12/22/2017   Prediabetes 12/22/2017   Pain of left calf 04/06/2017   Centrilobular emphysema (HCC) 02/23/2016   Obesity (BMI 30-39.9) 01/15/2016   Bilateral shoulder pain 01/15/2016   Esophageal reflux 01/15/2016   Tobacco abuse 05/13/2015   Lung nodule 08/14/2014   Impaired fasting glucose 08/14/2014   Hyperlipidemia 08/14/2014   Personal history of colonic adenomas 06/25/2013   Hypertension associated with type 2 diabetes mellitus (HCC) 05/20/2013     Current Outpatient Medications on File Prior to Visit  Medication Sig Dispense Refill   Accu-Chek Softclix Lancets lancets Use to check blood sugar three times daily. E11.69 100 each 6   acetaminophen (TYLENOL 8 HOUR) 650 MG CR tablet Take 1 tablet (650 mg total) by mouth 2 (two) times daily as needed for pain. 60 tablet 5   albuterol (PROAIR HFA) 108 (90 Base) MCG/ACT inhaler Inhale 2 puffs into the lungs every 4 (four) hours as needed for wheezing or shortness of breath. 6.7 g 0   albuterol (PROVENTIL) (2.5 MG/3ML) 0.083% nebulizer solution TAKE 3 MLS BY NEBULIZATION  EVERY 6 (SIX) HOURS AS NEEDED FOR WHEEZING OR SHORTNESS OF BREATH. 90 mL 1   aspirin EC 81 MG tablet Take 1 tablet (81 mg total) by mouth daily. 100 tablet 1   atorvastatin (LIPITOR) 80 MG tablet Take 1 tablet (80 mg total) by mouth daily. 90 tablet 3   Blood Glucose Monitoring Suppl (ACCU-CHEK GUIDE) w/Device KIT Use to check blood sugar three times daily. E11.69 1 kit 0   cetirizine (ZYRTEC) 10 MG tablet Take 1 tablet (10 mg total) by mouth daily. 30 tablet 11   Continuous Glucose Receiver (FREESTYLE LIBRE 2 READER) DEVI Use to check blood sugar continuously throughout the day. Change  sensors once every 14 days. 1 each 0   diphenhydrAMINE (BENADRYL) 25 mg capsule Take 25-50 mg by mouth every 6 (six) hours as needed for itching or allergies.     Elastic Bandages & Supports (MEDICAL COMPRESSION STOCKINGS) MISC R60.0 1 each 0   EPINEPHrine 0.3 mg/0.3 mL IJ SOAJ injection Inject 0.3 mg into the muscle as needed for anaphylaxis. 2 each 2   Evolocumab (REPATHA SURECLICK) 140 MG/ML SOAJ Inject 140 mg into the skin every 14 (fourteen) days. 6 mL 3   ezetimibe (ZETIA) 10 MG tablet Take 1 tablet (10 mg total) by mouth daily. 90 tablet 3   famotidine (PEPCID) 20 MG tablet Take 1 tablet (20 mg total) by mouth 2 (two) times daily as needed (hives). 60 tablet 5   fexofenadine (ALLEGRA ALLERGY) 180 MG tablet Take 1 tablet (180 mg total) by mouth 2 (two) times daily as needed (hives). 60 tablet 5   fluticasone (FLONASE) 50 MCG/ACT nasal spray Place 2 sprays into both nostrils daily. 16 g 5   fluticasone furoate-vilanterol (BREO ELLIPTA) 200-25 MCG/ACT AEPB Inhale 1 puff into the lungs once daily. 60 each 5   furosemide (LASIX) 20 MG tablet Take 1 tablet (20 mg total) by mouth daily as needed (for weight gain of 2-3 lbs overnight or swelling in legs/ankles).     glucose blood (ACCU-CHEK GUIDE) test strip Use to check blood sugar three times daily. E11.69 100 each 12   hydrALAZINE (APRESOLINE) 10 MG tablet Take 1 tablet (10 mg total) by mouth 2 (two) times daily. 180 tablet 1   hydrochlorothiazide (HYDRODIURIL) 25 MG tablet Take 1 tablet (25 mg total) by mouth daily. 30 tablet 0   hydrOXYzine (ATARAX) 25 MG tablet Take 1 tablet (25 mg total) by mouth every 8 (eight) hours as needed for up to 15 doses for itching. 15 tablet 0   insulin lispro (HUMALOG KWIKPEN) 100 UNIT/ML KwikPen Before each meal 3 times a day, 140-199 - 2 units, 200-250 - 4 units, 251-299 - 6 units,  300-349 - 8 units,  350 or above 10 units. 15 mL 0   Insulin Pen Needle 32G X 4 MM MISC Use 4 times daily with insulin pen. 100 each  0   Olopatadine HCl 0.2 % SOLN Place 1 drop into both eyes daily as needed (allergies). 2.5 mL 5   omeprazole (PRILOSEC) 20 MG capsule Take 1 capsule (20 mg total) by mouth daily. 90 capsule 3   oxybutynin (DITROPAN XL) 5 MG 24 hr tablet Take 1 tablet (5 mg total) by mouth at bedtime. (Patient taking differently: Take 5 mg by mouth daily as needed (for urinary incontinence).) 30 tablet 0   potassium chloride SA (KLOR-CON M) 20 MEQ tablet Take 2 tablets ( total) by mouth every morning and 1 tablet ( total) every evening  for the next 3 days (02/24/23, 02/25/23, and 02/26/23). Starting on 02/27/23 take 1 tablet by mouth twice daily.     Respiratory Therapy Supplies (FLUTTER) DEVI Use as directed 1 each 0   sodium chloride (OCEAN) 0.65 % nasal spray Place 2 sprays into both nostrils every 4 (four) hours. (Patient taking differently: Place 2 sprays into the nose every 4 (four) hours as needed for congestion.) 44 mL 0   Tiotropium Bromide Monohydrate (SPIRIVA RESPIMAT) 2.5 MCG/ACT AERS Inhale 2 puffs into the lungs daily. 4 g 5   No current facility-administered medications on file prior to visit.    Allergies  Allergen Reactions   Sulfa Antibiotics Anaphylaxis and Hives   Chantix [Varenicline Tartrate]     Bad dreams   Fish Allergy Other (See Comments)    Severe stomach cramps    Hygroton [Chlorthalidone]     Swelling or hives   Penicillins Other (See Comments)    Pt took Amoxicillin in 2019 without issue, reports PCN caused rash in the past   Latex Rash and Other (See Comments)    Dry skin    Social History   Socioeconomic History   Marital status: Married    Spouse name: Not on file   Number of children: Not on file   Years of education: 12+   Highest education level: Not on file  Occupational History   Occupation: PCA    Employer: [employer]  Tobacco Use   Smoking status: Former    Packs/day: 0.50    Years: 40.00    Additional pack years: 0.00    Total pack years:  20.00    Types: Cigarettes    Quit date: 07/2021    Years since quitting: 1.6    Passive exposure: Never   Smokeless tobacco: Never   Tobacco comments:    Quit [id number]  Vaping Use   Vaping Use: Former  Substance and Sexual Activity   Alcohol use: Yes    Comment: occasional   Drug use: No   Sexual activity: Not Currently  Other Topics Concern   Not on file  Social History Narrative   Regular exercise-no Caffeine Use-   Social Determinants of Health   Financial Resource Strain: Low Risk  (08/19/2022)   Overall Financial Resource Strain (CARDIA)    Difficulty of Paying Living Expenses: Not hard at all  Food Insecurity: No Food Insecurity (03/02/2023)   Hunger Vital Sign    Worried About Running Out of Food in the Last Year: Never true    Ran Out of Food in the Last Year: Never true  Transportation Needs: No Transportation Needs (03/02/2023)   PRAPARE - Administrator, Civil Service (Medical): No    Lack of Transportation (Non-Medical): No  Physical Activity: Inactive (08/19/2022)   Exercise Vital Sign    Days of Exercise per Week: 1 day    Minutes of Exercise per Session: 0 min  Stress: No Stress Concern Present (08/19/2022)   [employer] of Occupational Health - Occupational Stress Questionnaire    Feeling of Stress : Not at all  Social Connections: Socially Isolated (08/19/2022)   Social Connection and Isolation Panel [NHANES]    Frequency of Communication with Friends and Family: More than three times a week    Frequency of Social Gatherings with Friends and Family: Three times a week    Attends Religious Services: Never    Active Member of Clubs or Organizations: No    Attends Banker  Meetings: Never    Marital Status: Separated  Intimate Partner Violence: Not At Risk (08/19/2022)   Humiliation, Afraid, Rape, and Kick questionnaire    Fear of Current or Ex-Partner: No    Emotionally Abused: No    Physically Abused: No    Sexually Abused:  No    Family History  Problem Relation Age of Onset   Cancer Father        prostate   Hypertension Mother    Diabetes Mother    Hypertension Sister    Heart disease Maternal Uncle    Stroke Maternal Grandmother    Hypertension Maternal Grandmother    Hypertension Sister    Colon cancer Neg Hx    Stomach cancer Neg Hx    Rectal cancer Neg Hx    Colon polyps Neg Hx     Past Surgical History:  Procedure Laterality Date   ABDOMINAL HYSTERECTOMY  89yrs ago   due to heavy bleeding and fibroids    COLONOSCOPY     HEMORRHOID BANDING  2014   HEMORRHOID SURGERY  81yrs agao   RIGHT HEART CATH N/A 10/12/2022   Procedure: RIGHT HEART CATH;  Surgeon: Orbie Pyo, MD;  Location: [facility]VASIVE [facility];  Service: Cardiovascular;  Laterality: N/A;   UMBILICAL HERNIA REPAIR  06/2015    ROS: Review of Systems Negative except as stated above  PHYSICAL EXAM: BP 127/82 (BP Location: Left Arm, Patient Position: Sitting, Cuff Size: Normal)   Pulse 66   Temp 98.2 F (36.8 C) (Oral)   Ht 5\' 6"  (1.676 m)   Wt 261 lb (118.4 kg)   SpO2 95%   BMI 42.13 kg/m   Wt Readings from Last 3 Encounters:  04/04/23 261 lb (118.4 kg)  03/31/23 256 lb (116.1 kg)  03/06/23 259 lb 9.6 oz (117.8 kg)    Physical Exam  General appearance - alert, well appearing, older African-American female and in no distress Mental status - normal mood, behavior, speech, dress, motor activity, and thought processes Skin -cluster of fine bumps noted on the lateral aspect of the left elbow and an area of about 5 to 6 cm in greatest diameter.  No erythema noted.      Latest Ref Rng & Units 03/10/2023   10:27 AM 02/27/2023    9:06 AM 02/26/2023    8:33 PM  CMP  Glucose 70 - 99 mg/dL 89  161  096   BUN 8 - 27 mg/dL 29  13  14    Creatinine 0.57 - 1.00 mg/dL 0.45  4.09  8.11   Sodium 134 - 144 mmol/L 139  136  142   Potassium 3.5 - 5.2 mmol/L 3.9  3.6  3.2   Chloride 96 - 106 mmol/L 99  102  104   CO2 20 - 29 mmol/L  24  20    Calcium 8.7 - 10.3 mg/dL 9.4  8.9     Lipid Panel     Component Value Date/Time   CHOL 104 12/02/2022 0909   TRIG 102 12/02/2022 0909   HDL 52 12/02/2022 0909   CHOLHDL 2.0 12/02/2022 0909   CHOLHDL 3.7 08/17/2021 0340   VLDL 12 08/17/2021 0340   LDLCALC 33 12/02/2022 0909   LDLDIRECT 190.1 04/16/2013 1531    CBC    Component Value Date/Time   WBC 6.5 02/26/2023 1849   RBC 4.91 02/26/2023 1849   HGB 13.9 02/26/2023 2033   HGB 14.4 10/03/2022 1120   HCT 41.0 02/26/2023 2033  HCT 41.8 10/03/2022 1120   PLT 281 02/26/2023 1849   PLT 260 10/03/2022 1120   MCV 88.8 02/26/2023 1849   MCV 85 10/03/2022 1120   MCH 28.5 02/26/2023 1849   MCHC 32.1 02/26/2023 1849   RDW 17.9 (H) 02/26/2023 1849   RDW 17.9 (H) 10/03/2022 1120   LYMPHSABS 1.9 02/26/2023 1849   LYMPHSABS 2.2 02/10/2020 1000   MONOABS 0.3 02/26/2023 1849   EOSABS 0.0 02/26/2023 1849   EOSABS 0.2 02/10/2020 1000   BASOSABS 0.0 02/26/2023 1849   BASOSABS 0.1 02/10/2020 1000    ASSESSMENT AND PLAN:  1. Type 2 diabetes mellitus with morbid obesity (HCC) Reported blood sugar readings are at goal. So far she has not had any weight loss on the low-dose of Ozempic.  I recommend we increase the dose to 2.5 mg once a week.  Again I went over possible side effects including vomiting, abdominal pain, diarrhea, constipation, bowel blockage.  Advised to report any vomiting or abdominal pain to [place] Updated prescription for freestyle libre sensors sent to the pharmacy.  Advised that she try applying a piece of surgical tape over or around the sensor to help keep it in place. - Semaglutide,0.25 or 0.5MG /DOS, 2 MG/3ML SOPN; Inject 0.5 mg into the skin once a week.  Dispense: 3 mL; Refill: 2 - Microalbumin / creatinine urine ratio - Continuous Glucose Sensor (FREESTYLE LIBRE 2 SENSOR) MISC; Use to check blood sugar continuously throughout the day.  Dispense: 2 each; Refill: 6  2. Dermatitis Of questionable etiology.   Will give a trial of triamcinolone cream. - triamcinolone cream (KENALOG) 0.1 %; Apply 1 Application topically 2 (two) times daily.  Dispense: 30 g; Refill: 0    Patient was given the opportunity to ask questions.  Patient verbalized understanding of the plan and was able to repeat key elements of the plan.   This documentation was completed using Paediatric nurse.  Any transcriptional errors are unintentional.  Orders Placed This Encounter  Procedures   Microalbumin / creatinine urine ratio     Requested Prescriptions   Signed Prescriptions Disp Refills   triamcinolone cream (KENALOG) 0.1 % 30 g 0    Sig: Apply 1 Application topically 2 (two) times daily.   Semaglutide,0.25 or 0.5MG /DOS, 2 MG/3ML SOPN 3 mL 2    Sig: Inject 0.5 mg into the skin once a week.   Continuous Glucose Sensor (FREESTYLE LIBRE 2 SENSOR) MISC 2 each 6    Sig: Use to check blood sugar continuously throughout the day.    Return in about 4 months (around 08/04/2023).  Jonah Blue, MD, FACP

## 2023-04-05 ENCOUNTER — Encounter (HOSPITAL_BASED_OUTPATIENT_CLINIC_OR_DEPARTMENT_OTHER): Payer: 59

## 2023-04-05 LAB — MICROALBUMIN / CREATININE URINE RATIO
Creatinine, Urine: 109.8 mg/dL
Microalb/Creat Ratio: 24 mg/g creat (ref 0–29)
Microalbumin, Urine: 26.2 ug/mL

## 2023-04-10 ENCOUNTER — Telehealth: Payer: Self-pay

## 2023-04-10 ENCOUNTER — Encounter (HOSPITAL_BASED_OUTPATIENT_CLINIC_OR_DEPARTMENT_OTHER): Payer: Self-pay | Admitting: Pulmonary Disease

## 2023-04-10 ENCOUNTER — Ambulatory Visit (INDEPENDENT_AMBULATORY_CARE_PROVIDER_SITE_OTHER): Payer: 59 | Admitting: Pulmonary Disease

## 2023-04-10 ENCOUNTER — Telehealth: Payer: Self-pay | Admitting: Internal Medicine

## 2023-04-10 ENCOUNTER — Other Ambulatory Visit: Payer: Self-pay

## 2023-04-10 ENCOUNTER — Other Ambulatory Visit (HOSPITAL_COMMUNITY): Payer: Self-pay

## 2023-04-10 VITALS — BP 106/66 | HR 73 | Temp 98.4°F | Ht 67.0 in | Wt 258.6 lb

## 2023-04-10 DIAGNOSIS — J9611 Chronic respiratory failure with hypoxia: Secondary | ICD-10-CM

## 2023-04-10 DIAGNOSIS — J432 Centrilobular emphysema: Secondary | ICD-10-CM | POA: Diagnosis not present

## 2023-04-10 MED ORDER — FLUTICASONE FUROATE-VILANTEROL 200-25 MCG/ACT IN AEPB
1.0000 | INHALATION_SPRAY | Freq: Every day | RESPIRATORY_TRACT | 4 refills | Status: DC
Start: 2023-04-10 — End: 2023-10-05
  Filled 2023-04-10: qty 60, 60d supply, fill #0
  Filled 2023-04-10: qty 60, 30d supply, fill #0
  Filled 2023-05-19 – 2023-05-22 (×2): qty 60, 30d supply, fill #1
  Filled 2023-07-03: qty 60, 30d supply, fill #2
  Filled 2023-08-04: qty 60, 30d supply, fill #3

## 2023-04-10 MED ORDER — ALBUTEROL SULFATE HFA 108 (90 BASE) MCG/ACT IN AERS
2.0000 | INHALATION_SPRAY | RESPIRATORY_TRACT | 0 refills | Status: DC | PRN
  Filled 2023-04-10: qty 6.7, 16d supply, fill #0
  Filled 2023-04-10: qty 6.7, 17d supply, fill #0

## 2023-04-10 MED ORDER — SPIRIVA RESPIMAT 2.5 MCG/ACT IN AERS
2.0000 | INHALATION_SPRAY | Freq: Every day | RESPIRATORY_TRACT | 4 refills | Status: DC
  Filled 2023-04-10 (×2): qty 4, 30d supply, fill #0
  Filled 2023-05-19 – 2023-05-22 (×2): qty 4, 30d supply, fill #1
  Filled 2023-07-03: qty 4, 30d supply, fill #2
  Filled 2023-08-04: qty 4, 30d supply, fill #3

## 2023-04-10 NOTE — Progress Notes (Signed)
Subjective:   PATIENT ID: Krystal Allen GENDER: female DOB: 1956-02-09, MRN: [id number]   HPI  Chief Complaint  Patient presents with   Follow-up    Follow up. Patient has no complaints.     Reason for Visit: Follow-up  Ms. Krystal Allen is a 67 year old female former smoker with emphysema, chronic hypoxemic respiratory failure, Pulmonary Langerhans cell histiocytosis, hypertension, hyperlipidemia who presents follow-up.  She was hospitalized in October 2022 for COPD exacerbation secondary to influenza. She quit smoking since then and has done well. She does notice she is snacking more in place of her nicotine craving. She has shortness of breath with wheezing. Wheezing occurs at night sometimes. No chronic cough. Symptoms worsen with changes in weather and will use her rescue inhaler 1-2 a week. She is compliant with Breo 200 daily. She wears her oxygen when she feels short of breath. She checks her O2 levels which usually go down to 89%. She reports good quality of sleep >8 hours. She does snore. Her roommate does not report witnessed apnea or gasping. No morning headaches or fatigue.   04/15/22 Since our last visit she has been compliant with Breo and Spiriva and feels this is helping her. She has been wearing oxygen at night only when she thinks she needs it. Occasional shortness of breath. Sometimes coughing but this has improved. Wheezing with exertion. Does not feel like her albuterol inhaler is effective. Uses it every other with several puffs. No exacerbations in the last 8 months when she was hospitalized for the flu.  11/03/22 Since our last visit she is compliant with her Breo and Spiriva. She uses albuterol once a week when being outdoors. Weather changes including the cold care trigger wheezing. No limitations in ADLs but not exercising. Denies exacerbations requiring prednisone or antibiotics. She has not compliant with nightly oxygen. Has shortness of breath with moderate  exertion. She is working with her PCP to lose weight and currently on phentermine.   04/10/23 Since our last visit she reports breathing is overall doing well. No exacerbations since our last visit. Did recently go to ED in April for allergic reaction. Denies shortness of breath cough or wheezing. Sometimes uses rescue inhaler at least once a week. Usually with day time activity. No night symptoms. Compliant with nocturnal O2. Not really active but has started walking a few days a week. She is working on weight loss with ozempic and has lost 8lbs.  Social History: Former smoker. 1/2 ppd x 40 years [employer] x 33 years  Past Medical History:  Diagnosis Date   Allergy    COPD (chronic obstructive pulmonary disease) (HCC)    Depression    GERD (gastroesophageal reflux disease)    Hernia, abdominal    History of chicken pox    Hyperlipidemia    Hypertension    Internal hemorrhoids with Grade 3 prolapse and bleeding 06/19/2013   Osteoarthritis    Personal history of colonic adenomas 06/25/2013     Family History  Problem Relation Age of Onset   Cancer Father        prostate   Hypertension Mother    Diabetes Mother    Hypertension Sister    Heart disease Maternal Uncle    Stroke Maternal Grandmother    Hypertension Maternal Grandmother    Hypertension Sister    Colon cancer Neg Hx    Stomach cancer Neg Hx    Rectal cancer Neg Hx    Colon polyps Neg Hx  Social History   Occupational History   Occupation: PCA    Employer: [employer]  Tobacco Use   Smoking status: Former    Packs/day: 0.50    Years: 40.00    Additional pack years: 0.00    Total pack years: 20.00    Types: Cigarettes    Quit date: 07/2021    Years since quitting: 1.6    Passive exposure: Never   Smokeless tobacco: Never   Tobacco comments:    Quit [id number]  Vaping Use   Vaping Use: Former  Substance and Sexual Activity   Alcohol use: Yes    Comment: occasional   Drug use: No   Sexual activity: Not  Currently    Allergies  Allergen Reactions   Sulfa Antibiotics Anaphylaxis and Hives   Chantix [Varenicline Tartrate]     Bad dreams   Fish Allergy Other (See Comments)    Severe stomach cramps    Hygroton [Chlorthalidone]     Swelling or hives   Penicillins Other (See Comments)    Pt took Amoxicillin in 2019 without issue, reports PCN caused rash in the past   Latex Rash and Other (See Comments)    Dry skin     Outpatient Medications Prior to Visit  Medication Sig Dispense Refill   Accu-Chek Softclix Lancets lancets Use to check blood sugar three times daily. E11.69 100 each 6   acetaminophen (TYLENOL 8 HOUR) 650 MG CR tablet Take 1 tablet (650 mg total) by mouth 2 (two) times daily as needed for pain. 60 tablet 5   albuterol (PROVENTIL) (2.5 MG/3ML) 0.083% nebulizer solution TAKE 3 MLS BY NEBULIZATION EVERY 6 (SIX) HOURS AS NEEDED FOR WHEEZING OR SHORTNESS OF BREATH. 90 mL 1   aspirin EC 81 MG tablet Take 1 tablet (81 mg total) by mouth daily. 100 tablet 1   atorvastatin (LIPITOR) 80 MG tablet Take 1 tablet (80 mg total) by mouth daily. 90 tablet 3   Blood Glucose Monitoring Suppl (ACCU-CHEK GUIDE) w/Device KIT Use to check blood sugar three times daily. E11.69 1 kit 0   cetirizine (ZYRTEC) 10 MG tablet Take 1 tablet (10 mg total) by mouth daily. 30 tablet 11   Continuous Glucose Receiver (FREESTYLE LIBRE 2 READER) DEVI Use to check blood sugar continuously throughout the day. Change sensors once every 14 days. 1 each 0   Continuous Glucose Sensor (FREESTYLE LIBRE 2 SENSOR) MISC Use to check blood sugar continuously throughout the day. 2 each 6   diphenhydrAMINE (BENADRYL) 25 mg capsule Take 25-50 mg by mouth every 6 (six) hours as needed for itching or allergies.     Elastic Bandages & Supports (MEDICAL COMPRESSION STOCKINGS) MISC R60.0 1 each 0   EPINEPHrine 0.3 mg/0.3 mL IJ SOAJ injection Inject 0.3 mg into the muscle as needed for anaphylaxis. 2 each 2   Evolocumab (REPATHA  SURECLICK) 140 MG/ML SOAJ Inject 140 mg into the skin every 14 (fourteen) days. 6 mL 3   ezetimibe (ZETIA) 10 MG tablet Take 1 tablet (10 mg total) by mouth daily. 90 tablet 3   famotidine (PEPCID) 20 MG tablet Take 1 tablet (20 mg total) by mouth 2 (two) times daily as needed (hives). 60 tablet 5   fexofenadine (ALLEGRA ALLERGY) 180 MG tablet Take 1 tablet (180 mg total) by mouth 2 (two) times daily as needed (hives). 60 tablet 5   fluticasone (FLONASE) 50 MCG/ACT nasal spray Place 2 sprays into both nostrils daily. 16 g 5   furosemide (LASIX)  20 MG tablet Take 1 tablet (20 mg total) by mouth daily as needed (for weight gain of 2-3 lbs overnight or swelling in legs/ankles).     glucose blood (ACCU-CHEK GUIDE) test strip Use to check blood sugar three times daily. E11.69 100 each 12   hydrALAZINE (APRESOLINE) 10 MG tablet Take 1 tablet (10 mg total) by mouth 2 (two) times daily. 180 tablet 1   hydrochlorothiazide (HYDRODIURIL) 25 MG tablet Take 1 tablet (25 mg total) by mouth daily. 30 tablet 0   hydrOXYzine (ATARAX) 25 MG tablet Take 1 tablet (25 mg total) by mouth every 8 (eight) hours as needed for up to 15 doses for itching. 15 tablet 0   insulin lispro (HUMALOG KWIKPEN) 100 UNIT/ML KwikPen Before each meal 3 times a day, 140-199 - 2 units, 200-250 - 4 units, 251-299 - 6 units,  300-349 - 8 units,  350 or above 10 units. 15 mL 0   Insulin Pen Needle 32G X 4 MM MISC Use 4 times daily with insulin pen. 100 each 0   Olopatadine HCl 0.2 % SOLN Place 1 drop into both eyes daily as needed (allergies). 2.5 mL 5   omeprazole (PRILOSEC) 20 MG capsule Take 1 capsule (20 mg total) by mouth daily. 90 capsule 3   oxybutynin (DITROPAN XL) 5 MG 24 hr tablet Take 1 tablet (5 mg total) by mouth at bedtime. (Patient taking differently: Take 5 mg by mouth daily as needed (for urinary incontinence).) 30 tablet 0   potassium chloride SA (KLOR-CON M) 20 MEQ tablet Take 2 tablets ( total) by mouth every morning and  1 tablet ( total) every evening for the next 3 days (02/24/23, 02/25/23, and 02/26/23). Starting on 02/27/23 take 1 tablet by mouth twice daily.     Respiratory Therapy Supplies (FLUTTER) DEVI Use as directed 1 each 0   Semaglutide,0.25 or 0.5MG /DOS, 2 MG/3ML SOPN Inject 0.5 mg into the skin once a week. 3 mL 2   sodium chloride (OCEAN) 0.65 % nasal spray Place 2 sprays into both nostrils every 4 (four) hours. (Patient taking differently: Place 2 sprays into the nose every 4 (four) hours as needed for congestion.) 44 mL 0   triamcinolone cream (KENALOG) 0.1 % Apply 1 Application topically 2 (two) times daily. 30 g 0   albuterol (PROAIR HFA) 108 (90 Base) MCG/ACT inhaler Inhale 2 puffs into the lungs every 4 (four) hours as needed for wheezing or shortness of breath. 6.7 g 0   fluticasone furoate-vilanterol (BREO ELLIPTA) 200-25 MCG/ACT AEPB Inhale 1 puff into the lungs once daily. 60 each 5   Tiotropium Bromide Monohydrate (SPIRIVA RESPIMAT) 2.5 MCG/ACT AERS Inhale 2 puffs into the lungs daily. 4 g 5   No facility-administered medications prior to visit.    Review of Systems  Constitutional:  Negative for chills, diaphoresis, fever, malaise/fatigue and weight loss.  HENT:  Negative for congestion.   Respiratory:  Negative for cough, hemoptysis, sputum production, shortness of breath and wheezing.   Cardiovascular:  Negative for chest pain, palpitations and leg swelling.     Objective:   Vitals:   04/10/23 0906  BP: 106/66  Pulse: 73  Temp: 98.4 F (36.9 C)  TempSrc: Oral  SpO2: 93%  Weight: 258 lb 9.6 oz (117.3 kg)  Height: 5\' 7"  (1.702 m)  SpO2: 93 % O2 Device: None (Room air)  Physical Exam: General: Well-appearing, no acute distress HENT: [place], AT Eyes: EOMI, no scleral icterus Respiratory: Clear to auscultation bilaterally.  No crackles, wheezing or rales Cardiovascular: RRR, -M/R/G, no JVD Extremities:-Edema,-tenderness Neuro: AAO x4, CNII-XII grossly intact Psych:  Normal mood, normal affect  Data Reviewed:  Imaging: CT 12/15/17 - Mild emphysema. Numerous pulmonary nodules and thin walled cysts bilaterally. Larged lung nodule is 6 mm in the RUL CXR 08/19/21 - Normal. No infiltrate, effusion or edema. CT Coronary 03/31/22 - Visualized lung parenchyma with no pulmonary nodules, masses, infiltrate, effusion or pneumothorax. CXR 08/10/22 - Normal. No infiltrate effusion or edema  PFT: 01/26/21 FVC 2.99 (97%) FEV1 2.49 (103%) Ratio 79  TLC 88% DLCO 53% Interpretation: Normal spirometry and lung volumes. No significant BD response present. Moderately reduced gas exchange.  04/15/22 FVC 2.71 (91%) FEV1 2.06 (89%) Ratio 79 TLC 90% DLCO 60% Interpretation: Normal spirometry and lung volumes. Improved gas exchange to mildly reduced DLCO  Labs: CBC    Component Value Date/Time   WBC 6.5 02/26/2023 1849   RBC 4.91 02/26/2023 1849   HGB 13.9 02/26/2023 2033   HGB 14.4 10/03/2022 1120   HCT 41.0 02/26/2023 2033   HCT 41.8 10/03/2022 1120   PLT 281 02/26/2023 1849   PLT 260 10/03/2022 1120   MCV 88.8 02/26/2023 1849   MCV 85 10/03/2022 1120   MCH 28.5 02/26/2023 1849   MCHC 32.1 02/26/2023 1849   RDW 17.9 (H) 02/26/2023 1849   RDW 17.9 (H) 10/03/2022 1120   LYMPHSABS 1.9 02/26/2023 1849   LYMPHSABS 2.2 02/10/2020 1000   MONOABS 0.3 02/26/2023 1849   EOSABS 0.0 02/26/2023 1849   EOSABS 0.2 02/10/2020 1000   BASOSABS 0.0 02/26/2023 1849   BASOSABS 0.1 02/10/2020 1000   Ambulatory O2   11/06/21 - Interpretation: Recommend 3L pulsed with exertion and sleep ONO 12/2021 demonstrated ongoing hypoxemia  04/15/22 - Interpretation: No desaturations on ambulation      Assessment & Plan:   Discussion: 67 year old female former smoker with emphysema, chronic hypoxemic respiratory failure, Pulmonary langerhans cell histiocytosis, HTN, HLD who presents for follow-up. Symptoms well controlled. Working on weight loss. Compliant with ONO however due for renewal.  Discussed clinical course and management of COPD including bronchodilator regimen, preventive care and action plan for exacerbation and regular aerobic exercise.  Emphysema --CONTINUE Breo 200-25 mcg ONE puff ONCE a day. REFILL --CONTINUE Spiriva 2.5 mcg TWO puffs ONCE a day. REFILL --CONTINUE Albuterol AS NEEDED  Nocturnal Hypoxemia --CONTINUE oxygen 2L  via [place] nightly while sleeping --ORDER ONO on room air. Please contact our office once the overnight oxygen test is complete so we can obtain results  Pulmonary Langerhans cell histiocytosis Mildly reduced DLCO --Improved DLCO --REORDER pulmonary function tests for 04/2023  Morbid obesity BMI 42 --Currently on ozempic with PCP --Discussed regular aerobic exercise five days. Start slow and work up to 20-30 minutes daily --Take small weights with you during your walks  Health Maintenance Immunization History  Administered Date(s) Administered   Fluad Quad(high Dose 65+) 08/16/2022   Influenza Split 07/10/2017   Influenza,inj,Quad PF,6+ Mos 08/14/2014, 09/14/2020, 09/13/2021   PFIZER Comirnaty(Gray Top)Covid-19 Tri-Sucrose Vaccine 01/25/2021   PFIZER(Purple Top)SARS-COV-2 Vaccination 05/12/2020, 06/02/2020   PNEUMOCOCCAL CONJUGATE-20 08/24/2021   Respiratory Syncytial Virus Vaccine,Recomb Aduvanted(Arexvy) 08/16/2022   Tdap 08/14/2014   CT Lung Screen- Enrolled. Not completed. Will message team.  Orders Placed This Encounter  Procedures   Pulse oximetry, overnight    On room air    Standing Status:   Future    Standing Expiration Date:   04/09/2024   Meds ordered this encounter  Medications  fluticasone furoate-vilanterol (BREO ELLIPTA) 200-25 MCG/ACT AEPB    Sig: Inhale 1 puff into the lungs once daily.    Dispense:  60 each    Refill:  4   Tiotropium Bromide Monohydrate (SPIRIVA RESPIMAT) 2.5 MCG/ACT AERS    Sig: Inhale 2 puffs into the lungs daily.    Dispense:  4 g    Refill:  4   albuterol (PROAIR HFA) 108 (90  Base) MCG/ACT inhaler    Sig: Inhale 2 puffs into the lungs every 4 (four) hours as needed for wheezing or shortness of breath.    Dispense:  6.7 g    Refill:  0    Return in about 5 months (around 09/10/2023) for after PFT.  I have spent a total time of 30-minutes on the day of the appointment including chart review, data review, collecting history, coordinating care and discussing medical diagnosis and plan with the patient/family. Past medical history, allergies, medications were reviewed. Pertinent imaging, labs and tests included in this note have been reviewed and interpreted independently by me.  Meilah Delrosario Mechele Collin, MD [facility] 04/10/2023 9:25 AM  Office Number [phone]

## 2023-04-10 NOTE — Addendum Note (Signed)
Addended by: Jonah Blue B on: 04/10/2023 09:30 PM   Modules accepted: Orders

## 2023-04-10 NOTE — Telephone Encounter (Signed)
Received a message from Dr. Everardo All to schedule annual LDCT.  Patient had missed April 2024 LDCT due to being in the hospital.  Attempted to contact to schedule but no answer. Left VM and call back number to call to schedule LDCT.  Patient will need new order, as order has expired

## 2023-04-10 NOTE — Patient Instructions (Signed)
Emphysema --CONTINUE Breo 200-25 mcg ONE puff ONCE a day. REFILL --CONTINUE Spiriva 2.5 mcg TWO puffs ONCE a day. REFILL --CONTINUE Albuterol AS NEEDED  Nocturnal Hypoxemia --CONTINUE oxygen 2L  via [place] nightly while sleeping --ORDER ONO on room air. Please contact our office once the overnight oxygen test is complete so we can obtain results  Pulmonary Langerhans cell histiocytosis Mildly reduced DLCO --Improved DLCO --REORDER pulmonary function tests for 04/2023  Morbid obesity BMI 42 --Currently on ozempic with PCP --Discussed regular aerobic exercise five days. Start slow and work up to 20-30 minutes daily --Take small weights with you during your walks

## 2023-04-10 NOTE — Telephone Encounter (Signed)
Copied from CRM [id number]. Topic: General - Other >> Apr 10, 2023 10:18 AM Carrielelia G wrote: Reason for CRM:  I would like to be referred to a doctor who specializes in diabetes patients and/or a class on diabetes

## 2023-04-11 ENCOUNTER — Other Ambulatory Visit: Payer: Self-pay

## 2023-04-11 DIAGNOSIS — Z122 Encounter for screening for malignant neoplasm of respiratory organs: Secondary | ICD-10-CM

## 2023-04-11 DIAGNOSIS — Z87891 Personal history of nicotine dependence: Secondary | ICD-10-CM

## 2023-04-11 NOTE — Telephone Encounter (Signed)
Called & spoke to the patient. Verified name & DOB. Informed that endocrinology referral was submitted. Patient expressed verbal understanding.

## 2023-04-12 DIAGNOSIS — E1165 Type 2 diabetes mellitus with hyperglycemia: Secondary | ICD-10-CM | POA: Diagnosis not present

## 2023-04-12 DIAGNOSIS — I10 Essential (primary) hypertension: Secondary | ICD-10-CM | POA: Diagnosis not present

## 2023-04-12 DIAGNOSIS — N1831 Chronic kidney disease, stage 3a: Secondary | ICD-10-CM | POA: Diagnosis not present

## 2023-04-12 DIAGNOSIS — J449 Chronic obstructive pulmonary disease, unspecified: Secondary | ICD-10-CM | POA: Diagnosis not present

## 2023-04-12 DIAGNOSIS — E7800 Pure hypercholesterolemia, unspecified: Secondary | ICD-10-CM | POA: Diagnosis not present

## 2023-04-12 DIAGNOSIS — C966 Unifocal Langerhans-cell histiocytosis: Secondary | ICD-10-CM | POA: Diagnosis not present

## 2023-04-12 LAB — HM DIABETES EYE EXAM

## 2023-04-13 ENCOUNTER — Other Ambulatory Visit: Payer: Self-pay

## 2023-04-13 ENCOUNTER — Other Ambulatory Visit: Payer: Self-pay | Admitting: Internal Medicine

## 2023-04-13 ENCOUNTER — Other Ambulatory Visit (HOSPITAL_COMMUNITY): Payer: Self-pay

## 2023-04-13 DIAGNOSIS — I10 Essential (primary) hypertension: Secondary | ICD-10-CM

## 2023-04-13 MED ORDER — FUROSEMIDE 20 MG PO TABS
20.0000 mg | ORAL_TABLET | Freq: Every day | ORAL | 2 refills | Status: DC | PRN
  Filled 2023-04-13: qty 90, 90d supply, fill #0
  Filled 2023-05-19 – 2023-08-13 (×3): qty 90, 90d supply, fill #1
  Filled 2023-11-22: qty 90, 90d supply, fill #2

## 2023-04-13 MED ORDER — POTASSIUM CHLORIDE CRYS ER 20 MEQ PO TBCR
20.0000 meq | EXTENDED_RELEASE_TABLET | Freq: Two times a day (BID) | ORAL | 2 refills | Status: DC
  Filled 2023-04-13 (×2): qty 180, 90d supply, fill #0

## 2023-04-13 MED ORDER — FUROSEMIDE 20 MG PO TABS
20.0000 mg | ORAL_TABLET | Freq: Every day | ORAL | 2 refills | Status: DC | PRN
  Filled 2023-04-13 (×2): qty 90, 90d supply, fill #0

## 2023-04-13 MED ORDER — POTASSIUM CHLORIDE CRYS ER 20 MEQ PO TBCR
20.0000 meq | EXTENDED_RELEASE_TABLET | Freq: Two times a day (BID) | ORAL | 2 refills | Status: DC
  Filled 2023-04-13: qty 180, 90d supply, fill #0
  Filled 2023-10-14: qty 180, 90d supply, fill #1
  Filled 2024-02-26: qty 180, 90d supply, fill #2

## 2023-04-13 MED ORDER — HYDROCHLOROTHIAZIDE 25 MG PO TABS
25.0000 mg | ORAL_TABLET | Freq: Every day | ORAL | 1 refills | Status: DC
Start: 2023-04-13 — End: 2023-10-20
  Filled 2023-04-13: qty 90, 90d supply, fill #0
  Filled 2023-07-20: qty 90, 90d supply, fill #1

## 2023-04-13 NOTE — Telephone Encounter (Signed)
Pt's medications were sent to pt's pharmacy as requested. Confirmation received.  

## 2023-04-14 ENCOUNTER — Other Ambulatory Visit (HOSPITAL_COMMUNITY): Payer: Self-pay

## 2023-04-14 ENCOUNTER — Other Ambulatory Visit: Payer: Self-pay

## 2023-04-19 DIAGNOSIS — E1165 Type 2 diabetes mellitus with hyperglycemia: Secondary | ICD-10-CM | POA: Diagnosis not present

## 2023-04-24 ENCOUNTER — Other Ambulatory Visit (HOSPITAL_COMMUNITY): Payer: Self-pay

## 2023-04-24 ENCOUNTER — Other Ambulatory Visit: Payer: Self-pay

## 2023-04-24 DIAGNOSIS — R0602 Shortness of breath: Secondary | ICD-10-CM | POA: Diagnosis not present

## 2023-04-26 ENCOUNTER — Other Ambulatory Visit (HOSPITAL_BASED_OUTPATIENT_CLINIC_OR_DEPARTMENT_OTHER): Payer: Self-pay

## 2023-05-03 ENCOUNTER — Ambulatory Visit (HOSPITAL_BASED_OUTPATIENT_CLINIC_OR_DEPARTMENT_OTHER)
Admission: RE | Admit: 2023-05-03 | Discharge: 2023-05-03 | Disposition: A | Discharge status: Home / Self Care | Payer: 59 | Source: Ambulatory Visit | Attending: *Deleted | Admitting: *Deleted

## 2023-05-03 DIAGNOSIS — I700 Atherosclerosis of aorta: Secondary | ICD-10-CM | POA: Insufficient documentation

## 2023-05-03 DIAGNOSIS — J439 Emphysema, unspecified: Secondary | ICD-10-CM | POA: Insufficient documentation

## 2023-05-03 DIAGNOSIS — Z122 Encounter for screening for malignant neoplasm of respiratory organs: Secondary | ICD-10-CM | POA: Diagnosis not present

## 2023-05-03 DIAGNOSIS — Z87891 Personal history of nicotine dependence: Secondary | ICD-10-CM | POA: Insufficient documentation

## 2023-05-03 DIAGNOSIS — F17210 Nicotine dependence, cigarettes, uncomplicated: Secondary | ICD-10-CM | POA: Diagnosis not present

## 2023-05-05 ENCOUNTER — Ambulatory Visit: Payer: 59 | Attending: Internal Medicine | Admitting: Internal Medicine

## 2023-05-05 ENCOUNTER — Encounter: Payer: Self-pay | Admitting: Internal Medicine

## 2023-05-05 VITALS — BP 116/66 | HR 70 | Ht 66.0 in | Wt 256.8 lb

## 2023-05-05 DIAGNOSIS — E785 Hyperlipidemia, unspecified: Secondary | ICD-10-CM | POA: Diagnosis not present

## 2023-05-05 DIAGNOSIS — E7841 Elevated Lipoprotein(a): Secondary | ICD-10-CM | POA: Diagnosis not present

## 2023-05-05 DIAGNOSIS — I2510 Atherosclerotic heart disease of native coronary artery without angina pectoris: Secondary | ICD-10-CM

## 2023-05-05 NOTE — Patient Instructions (Signed)
Medication Instructions:  No medication changes  *If you need a refill on your cardiac medications before your next appointment, please call your pharmacy*   Lab Work: Fasting labs   NMR Lipoprotein A Getting done today since patient is fasting    Follow-Up: At [facility], you and your health needs are our priority.  As part of our continuing mission to provide you with exceptional heart care, we have created designated Provider Care Teams.  These Care Teams include your primary Cardiologist (physician) and Advanced Practice Providers (APPs -  Physician Assistants and Nurse Practitioners) who all work together to provide you with the care you need, when you need it.  We recommend signing up for the patient portal called "MyChart".  Sign up information is provided on this After Visit Summary.  MyChart is used to connect with patients for Virtual Visits (Telemedicine).  Patients are able to view lab/test results, encounter notes, upcoming appointments, etc.  Non-urgent messages can be sent to your provider as well.   To learn more about what you can do with MyChart, go to [email or web address].    Your next appointment:   12 month(s)  Provider:   Orbie Pyo, MD

## 2023-05-05 NOTE — Progress Notes (Signed)
LIPID CLINIC CONSULT NOTE  Chief Complaint:  Manage dyslipidemia  Primary Care Physician: Marcine Matar, MD  Primary Cardiologist:  Orbie Pyo, MD  HPI:  Krystal Allen is a 67 y.o. female who is being seen today for the evaluation of dyslipidemia at the request of Marcine Matar, MD. This is a pleasant 67 yo female kindly referred for evaluation and management of dyslipidemia.  Ms. Rominger underwent CT coronary angiography this past summer which demonstrated nonobstructive coronary disease but she did have an elevated calcium score of 24.9, 72nd percentile for age and sex matched controls.  She also has a diagnostic history of COPD and was noted to have probable pulmonary hypertension.  She has been short of breath and is scheduled for an upcoming right heart catheterization.  She was referred because of an abnormal lipid profile, specifically an elevated LP(a) which was high at 318.9 nmol/L.  Her last on treatment lipid profile showed total cholesterol 185, triglycerides 132, HDL 55 and LDL 107.  This is down from 130 about 4 months previous to that.  She is on atorvastatin 80 mg daily and was recently considered for ezetimibe.  She has not started on this medication.  05/05/2023  Ms. Meuser returns today for follow-up.  She is doing well on combination therapy with Repatha, atorvastatin and ezetimibe.  No perceived side effects.  She did have a regular lipid profile in February which showed total 104, HDL 52, triglycerides 102 and LDL 33.  She has not had repeat testing of her LP(a) which was greater than 300 nmol/L.  PMHx:  Past Medical History:  Diagnosis Date   Allergy    COPD (chronic obstructive pulmonary disease) (HCC)    Depression    GERD (gastroesophageal reflux disease)    Hernia, abdominal    History of chicken pox    Hyperlipidemia    Hypertension    Internal hemorrhoids with Grade 3 prolapse and bleeding 06/19/2013   Osteoarthritis    Personal history of  colonic adenomas 06/25/2013    Past Surgical History:  Procedure Laterality Date   ABDOMINAL HYSTERECTOMY  64yrs ago   due to heavy bleeding and fibroids    COLONOSCOPY     HEMORRHOID BANDING  2014   HEMORRHOID SURGERY  9yrs agao   RIGHT HEART CATH N/A 10/12/2022   Procedure: RIGHT HEART CATH;  Surgeon: Orbie Pyo, MD;  Location: [facility];  Service: Cardiovascular;  Laterality: N/A;   UMBILICAL HERNIA REPAIR  06/2015    FAMHx:  Family History  Problem Relation Age of Onset   Cancer Father        prostate   Hypertension Mother    Diabetes Mother    Hypertension Sister    Heart disease Maternal Uncle    Stroke Maternal Grandmother    Hypertension Maternal Grandmother    Hypertension Sister    Colon cancer Neg Hx    Stomach cancer Neg Hx    Rectal cancer Neg Hx    Colon polyps Neg Hx     SOCHx:   reports that she quit smoking about 21 months ago. Her smoking use included cigarettes. She has a 20.00 pack-year smoking history. She has never been exposed to tobacco smoke. She has never used smokeless tobacco. She reports current alcohol use. She reports that she does not use drugs.  ALLERGIES:  Allergies  Allergen Reactions   Sulfa Antibiotics Anaphylaxis and Hives   Chantix [Varenicline Tartrate]  Bad dreams   Fish Allergy Other (See Comments)    Severe stomach cramps    Hygroton [Chlorthalidone]     Swelling or hives   Penicillins Other (See Comments)    Pt took Amoxicillin in 2019 without issue, reports PCN caused rash in the past   Latex Rash and Other (See Comments)    Dry skin    ROS: Pertinent items noted in HPI and remainder of comprehensive ROS otherwise negative.  HOME MEDS: Current Outpatient Medications on File Prior to Visit  Medication Sig Dispense Refill   Accu-Chek Softclix Lancets lancets Use to check blood sugar three times daily. E11.69 100 each 6   acetaminophen (TYLENOL 8 HOUR) 650 MG CR tablet Take 1 tablet (650 mg total) by  mouth 2 (two) times daily as needed for pain. 60 tablet 5   albuterol (PROAIR HFA) 108 (90 Base) MCG/ACT inhaler Inhale 2 puffs into the lungs every 4 (four) hours as needed for wheezing or shortness of breath. 6.7 g 0   albuterol (PROVENTIL) (2.5 MG/3ML) 0.083% nebulizer solution TAKE 3 MLS BY NEBULIZATION EVERY 6 (SIX) HOURS AS NEEDED FOR WHEEZING OR SHORTNESS OF BREATH. 90 mL 1   aspirin EC 81 MG tablet Take 1 tablet (81 mg total) by mouth daily. 100 tablet 1   atorvastatin (LIPITOR) 80 MG tablet Take 1 tablet (80 mg total) by mouth daily. 90 tablet 3   Blood Glucose Monitoring Suppl (ACCU-CHEK GUIDE) w/Device KIT Use to check blood sugar three times daily. E11.69 1 kit 0   cetirizine (ZYRTEC) 10 MG tablet Take 1 tablet (10 mg total) by mouth daily. 30 tablet 11   Continuous Glucose Receiver (FREESTYLE LIBRE 2 READER) DEVI Use to check blood sugar continuously throughout the day. Change sensors once every 14 days. 1 each 0   Continuous Glucose Sensor (FREESTYLE LIBRE 2 SENSOR) MISC Use to check blood sugar continuously throughout the day. 2 each 6   diphenhydrAMINE (BENADRYL) 25 mg capsule Take 25-50 mg by mouth every 6 (six) hours as needed for itching or allergies.     Elastic Bandages & Supports (MEDICAL COMPRESSION STOCKINGS) MISC R60.0 1 each 0   EPINEPHrine 0.3 mg/0.3 mL IJ SOAJ injection Inject 0.3 mg into the muscle as needed for anaphylaxis. 2 each 2   Evolocumab (REPATHA SURECLICK) 140 MG/ML SOAJ Inject 140 mg into the skin every 14 (fourteen) days. 6 mL 3   ezetimibe (ZETIA) 10 MG tablet Take 1 tablet (10 mg total) by mouth daily. 90 tablet 3   famotidine (PEPCID) 20 MG tablet Take 1 tablet (20 mg total) by mouth 2 (two) times daily as needed (hives). 60 tablet 5   fexofenadine (ALLEGRA ALLERGY) 180 MG tablet Take 1 tablet (180 mg total) by mouth 2 (two) times daily as needed (hives). 60 tablet 5   fluticasone (FLONASE) 50 MCG/ACT nasal spray Place 2 sprays into both nostrils daily. 16  g 5   fluticasone furoate-vilanterol (BREO ELLIPTA) 200-25 MCG/ACT AEPB Inhale 1 puff into the lungs once daily. 60 each 4   furosemide (LASIX) 20 MG tablet Take 1 tablet (20 mg total) by mouth daily as needed (for weight gain of 2-3 lbs overnight or swelling in legs/ankles). 90 tablet 2   glucose blood (ACCU-CHEK GUIDE) test strip Use to check blood sugar three times daily. E11.69 100 each 12   hydrALAZINE (APRESOLINE) 10 MG tablet Take 1 tablet (10 mg total) by mouth 2 (two) times daily. 180 tablet 1   hydrochlorothiazide (HYDRODIURIL)  25 MG tablet Take 1 tablet (25 mg total) by mouth daily. 90 tablet 1   hydrOXYzine (ATARAX) 25 MG tablet Take 1 tablet (25 mg total) by mouth every 8 (eight) hours as needed for up to 15 doses for itching. 15 tablet 0   insulin lispro (HUMALOG KWIKPEN) 100 UNIT/ML KwikPen Before each meal 3 times a day, 140-199 - 2 units, 200-250 - 4 units, 251-299 - 6 units,  300-349 - 8 units,  350 or above 10 units. 15 mL 0   Insulin Pen Needle 32G X 4 MM MISC Use 4 times daily with insulin pen. 100 each 0   Olopatadine HCl 0.2 % SOLN Place 1 drop into both eyes daily as needed (allergies). 2.5 mL 5   omeprazole (PRILOSEC) 20 MG capsule Take 1 capsule (20 mg total) by mouth daily. 90 capsule 3   oxybutynin (DITROPAN XL) 5 MG 24 hr tablet Take 1 tablet (5 mg total) by mouth at bedtime. (Patient taking differently: Take 5 mg by mouth daily as needed (for urinary incontinence).) 30 tablet 0   potassium chloride SA (KLOR-CON M) 20 MEQ tablet Take 1 tablet (20 mEq total) by mouth 2 (two) times daily. 180 tablet 2   Respiratory Therapy Supplies (FLUTTER) DEVI Use as directed 1 each 0   Semaglutide,0.25 or 0.5MG /DOS, 2 MG/3ML SOPN Inject 0.5 mg into the skin once a week. 3 mL 2   sodium chloride (OCEAN) 0.65 % nasal spray Place 2 sprays into both nostrils every 4 (four) hours. (Patient taking differently: Place 2 sprays into the nose every 4 (four) hours as needed for congestion.) 44 mL 0    Tiotropium Bromide Monohydrate (SPIRIVA RESPIMAT) 2.5 MCG/ACT AERS Inhale 2 puffs into the lungs daily. 4 g 4   triamcinolone cream (KENALOG) 0.1 % Apply 1 Application topically 2 (two) times daily. 30 g 0   No current facility-administered medications on file prior to visit.    LABS/IMAGING: No results found for this or any previous visit (from the past 48 hour(s)).  No results found.  LIPID PANEL:    Component Value Date/Time   CHOL 104 12/02/2022 0909   TRIG 102 12/02/2022 0909   HDL 52 12/02/2022 0909   CHOLHDL 2.0 12/02/2022 0909   CHOLHDL 3.7 08/17/2021 0340   VLDL 12 08/17/2021 0340   LDLCALC 33 12/02/2022 0909   LDLDIRECT 190.1 04/16/2013 1531    WEIGHTS: Wt Readings from Last 3 Encounters:  05/05/23 256 lb 12.8 oz (116.5 kg)  05/03/23 241 lb (109.3 kg)  04/10/23 258 lb 9.6 oz (117.3 kg)    VITALS: BP 116/66 (BP Location: Right Arm, Patient Position: Sitting, Cuff Size: Large)   Pulse 70   Ht 5\' 6"  (1.676 m)   Wt 256 lb 12.8 oz (116.5 kg)   SpO2 92%   BMI 41.45 kg/m   EXAM: Deferred  EKG: Deferred  ASSESSMENT: Mixed dyslipidemia, goal LDL less than 70 Elevated coronary artery calcium score with nonobstructive coronary disease (03/2022) Elevated LP(a) at 318.9 nmol/L  PLAN: 1.   Ms. Seegert had lipid testing in February which showed marked reduction in her LDL now down to 33 however she has not had reassessment of her LP(a) which I am hopeful has come down in light of being on Repatha.  Will plan to repeat lipid NMR and LP(a) today and follow-up with me annually or sooner as necessary.  Chrystie Nose, MD, [facility], FACP  [place]  [facility]  Medical Director of the  [facility]  [facility] Diplomate of the American Board of Clinical Lipidology Attending Cardiologist  Direct Dial: [phone]  Fax: [phone]  Website:  [email or web address]  Lisette Abu Daxtin Leiker 05/05/2023, 10:14 AM

## 2023-05-06 LAB — LIPOPROTEIN A (LPA)

## 2023-05-06 LAB — NMR, LIPOPROFILE
HDL Particle Number: 30.9 umol/L (ref >=30.5)
LDL Size: 21.9 nm (ref >20.5)
Triglycerides: 73 mg/dL (ref 0–149)

## 2023-05-08 ENCOUNTER — Telehealth: Payer: Self-pay | Admitting: Pulmonary Disease

## 2023-05-08 ENCOUNTER — Other Ambulatory Visit: Payer: Self-pay

## 2023-05-08 DIAGNOSIS — Z87891 Personal history of nicotine dependence: Secondary | ICD-10-CM

## 2023-05-08 DIAGNOSIS — Z122 Encounter for screening for malignant neoplasm of respiratory organs: Secondary | ICD-10-CM

## 2023-05-09 LAB — NMR, LIPOPROFILE
Cholesterol, Total: 106 mg/dL (ref 100–199)
HDL-C: 49 mg/dL (ref >39)
LDL Particle Number: 488 nmol/L (ref <1000)
LDL-C (NIH Calc): 42 mg/dL (ref 0–99)
LP-IR Score: 37 (ref <=45)
Small LDL Particle Number: 114 nmol/L (ref <=527)

## 2023-05-10 NOTE — Telephone Encounter (Signed)
Will send message to [facility] to review ONO order status  Addendum: Patient reports [employer] did contact her but did not deliver the test to her home.

## 2023-05-12 NOTE — Telephone Encounter (Signed)
Spoke to Garrison from [employer] and she is advising the patient to call financial services of [employer] at [phone] as she has a hold on his account that's created a delay in processing this ONO order.   Patient is aware and is going to call me back once she's spoken with them.

## 2023-05-19 ENCOUNTER — Other Ambulatory Visit: Payer: Self-pay | Admitting: Internal Medicine

## 2023-05-20 ENCOUNTER — Other Ambulatory Visit (HOSPITAL_COMMUNITY): Payer: Self-pay

## 2023-05-22 ENCOUNTER — Other Ambulatory Visit (HOSPITAL_COMMUNITY): Payer: Self-pay

## 2023-05-22 ENCOUNTER — Other Ambulatory Visit: Payer: Self-pay

## 2023-05-22 MED ORDER — ATORVASTATIN CALCIUM 80 MG PO TABS
80.0000 mg | ORAL_TABLET | Freq: Every day | ORAL | 3 refills | Status: DC
  Filled 2023-05-22 (×2): qty 90, 90d supply, fill #0
  Filled 2023-09-11: qty 90, 90d supply, fill #1
  Filled 2023-12-11: qty 90, 90d supply, fill #2
  Filled 2024-03-31: qty 90, 90d supply, fill #3

## 2023-05-24 DIAGNOSIS — R0602 Shortness of breath: Secondary | ICD-10-CM | POA: Diagnosis not present

## 2023-05-25 ENCOUNTER — Other Ambulatory Visit: Payer: Self-pay

## 2023-06-02 ENCOUNTER — Ambulatory Visit: Payer: 59 | Admitting: Internal Medicine

## 2023-06-08 ENCOUNTER — Other Ambulatory Visit: Payer: Self-pay

## 2023-06-17 ENCOUNTER — Other Ambulatory Visit (HOSPITAL_COMMUNITY): Payer: Self-pay

## 2023-06-17 ENCOUNTER — Other Ambulatory Visit: Payer: Self-pay

## 2023-06-19 ENCOUNTER — Other Ambulatory Visit: Payer: Self-pay

## 2023-06-19 ENCOUNTER — Other Ambulatory Visit (HOSPITAL_COMMUNITY): Payer: Self-pay

## 2023-06-20 ENCOUNTER — Ambulatory Visit: Payer: 59 | Attending: Internal Medicine

## 2023-06-20 ENCOUNTER — Other Ambulatory Visit: Payer: Self-pay

## 2023-06-20 VITALS — Ht 66.0 in | Wt 256.0 lb

## 2023-06-20 DIAGNOSIS — Z1231 Encounter for screening mammogram for malignant neoplasm of breast: Secondary | ICD-10-CM

## 2023-06-20 DIAGNOSIS — Z0000 Encounter for general adult medical examination without abnormal findings: Secondary | ICD-10-CM

## 2023-06-20 NOTE — Progress Notes (Signed)
Subjective:   Krystal Allen is a 67 y.o. female who presents for Medicare Annual (Subsequent) preventive examination.  Visit Complete: Virtual  I connected with  Krystal Allen on 06/20/23 by a audio enabled telemedicine application and verified that I am speaking with the correct person using two identifiers.  Patient Location: Home  Provider Location: Home Office  I discussed the limitations of evaluation and management by telemedicine. The patient expressed understanding and agreed to proceed.  Patient Medicare AWV questionnaire was completed by the patient on 06/20/23; I have confirmed that all information answered by patient is correct and no changes since this date.  Vital Signs: Because this visit was a virtual/telehealth visit, some criteria may be missing or patient reported. Any vitals not documented were not able to be obtained and vitals that have been documented are patient reported.   Review of Systems     Cardiac Risk Factors include: advanced age (>8men, >4 women);hypertension;smoking/ tobacco exposure;dyslipidemia     Objective:    Today's Vitals   06/20/23 1248  Weight: 256 lb (116.1 kg)  Height: 5\' 6"  (1.676 m)   Body mass index is 41.32 kg/m.     06/20/2023   12:58 PM 02/26/2023    5:47 PM 10/12/2022   12:58 PM 08/19/2022   10:39 AM 08/10/2022    8:30 AM 05/13/2022   11:02 AM 08/17/2021    4:02 PM  Advanced Directives  Does Patient Have a Medical Advance Directive? No No No No No No No  Would patient like information on creating a medical advance directive? Yes (MAU/Ambulatory/Procedural Areas - Information given) No - Patient declined No - Patient declined  No - Patient declined No - Patient declined No - Patient declined    Current Medications (verified) Outpatient Encounter Medications as of 06/20/2023  Medication Sig   Accu-Chek Softclix Lancets lancets Use to check blood sugar three times daily. E11.69   acetaminophen (TYLENOL 8 HOUR) 650  MG CR tablet Take 1 tablet (650 mg total) by mouth 2 (two) times daily as needed for pain.   albuterol (PROAIR HFA) 108 (90 Base) MCG/ACT inhaler Inhale 2 puffs into the lungs every 4 (four) hours as needed for wheezing or shortness of breath.   albuterol (PROVENTIL) (2.5 MG/3ML) 0.083% nebulizer solution TAKE 3 MLS BY NEBULIZATION EVERY 6 (SIX) HOURS AS NEEDED FOR WHEEZING OR SHORTNESS OF BREATH.   aspirin EC 81 MG tablet Take 1 tablet (81 mg total) by mouth daily.   atorvastatin (LIPITOR) 80 MG tablet Take 1 tablet (80 mg total) by mouth daily.   Blood Glucose Monitoring Suppl (ACCU-CHEK GUIDE) w/Device KIT Use to check blood sugar three times daily. E11.69   cetirizine (ZYRTEC) 10 MG tablet Take 1 tablet (10 mg total) by mouth daily.   Continuous Glucose Receiver (FREESTYLE LIBRE 2 READER) DEVI Use to check blood sugar continuously throughout the day. Change sensors once every 14 days.   Continuous Glucose Sensor (FREESTYLE LIBRE 2 SENSOR) MISC Use to check blood sugar continuously throughout the day.   diphenhydrAMINE (BENADRYL) 25 mg capsule Take 25-50 mg by mouth every 6 (six) hours as needed for itching or allergies.   Elastic Bandages & Supports (MEDICAL COMPRESSION STOCKINGS) MISC R60.0   EPINEPHrine 0.3 mg/0.3 mL IJ SOAJ injection Inject 0.3 mg into the muscle as needed for anaphylaxis.   Evolocumab (REPATHA SURECLICK) 140 MG/ML SOAJ Inject 140 mg into the skin every 14 (fourteen) days.   ezetimibe (ZETIA) 10 MG tablet Take 1  tablet (10 mg total) by mouth daily.   famotidine (PEPCID) 20 MG tablet Take 1 tablet (20 mg total) by mouth 2 (two) times daily as needed (hives).   fexofenadine (ALLEGRA ALLERGY) 180 MG tablet Take 1 tablet (180 mg total) by mouth 2 (two) times daily as needed (hives).   fluticasone (FLONASE) 50 MCG/ACT nasal spray Place 2 sprays into both nostrils daily.   fluticasone furoate-vilanterol (BREO ELLIPTA) 200-25 MCG/ACT AEPB Inhale 1 puff into the lungs once daily.    furosemide (LASIX) 20 MG tablet Take 1 tablet (20 mg total) by mouth daily as needed (for weight gain of 2-3 lbs overnight or swelling in legs/ankles).   glucose blood (ACCU-CHEK GUIDE) test strip Use to check blood sugar three times daily. E11.69   hydrALAZINE (APRESOLINE) 10 MG tablet Take 1 tablet (10 mg total) by mouth 2 (two) times daily.   hydrochlorothiazide (HYDRODIURIL) 25 MG tablet Take 1 tablet (25 mg total) by mouth daily.   hydrOXYzine (ATARAX) 25 MG tablet Take 1 tablet (25 mg total) by mouth every 8 (eight) hours as needed for up to 15 doses for itching.   insulin lispro (HUMALOG KWIKPEN) 100 UNIT/ML KwikPen Before each meal 3 times a day, 140-199 - 2 units, 200-250 - 4 units, 251-299 - 6 units,  300-349 - 8 units,  350 or above 10 units.   Insulin Pen Needle 32G X 4 MM MISC Use 4 times daily with insulin pen.   Olopatadine HCl 0.2 % SOLN Place 1 drop into both eyes daily as needed (allergies).   omeprazole (PRILOSEC) 20 MG capsule Take 1 capsule (20 mg total) by mouth daily.   oxybutynin (DITROPAN XL) 5 MG 24 hr tablet Take 1 tablet (5 mg total) by mouth at bedtime. (Patient taking differently: Take 5 mg by mouth daily as needed (for urinary incontinence).)   potassium chloride SA (KLOR-CON M) 20 MEQ tablet Take 1 tablet (20 mEq total) by mouth 2 (two) times daily.   Respiratory Therapy Supplies (FLUTTER) DEVI Use as directed   Semaglutide,0.25 or 0.5MG /DOS, 2 MG/3ML SOPN Inject 0.5 mg into the skin once a week.   sodium chloride (OCEAN) 0.65 % nasal spray Place 2 sprays into both nostrils every 4 (four) hours. (Patient taking differently: Place 2 sprays into the nose every 4 (four) hours as needed for congestion.)   Tiotropium Bromide Monohydrate (SPIRIVA RESPIMAT) 2.5 MCG/ACT AERS Inhale 2 puffs into the lungs daily.   triamcinolone cream (KENALOG) 0.1 % Apply 1 Application topically 2 (two) times daily.   No facility-administered encounter medications on file as of 06/20/2023.     Allergies (verified) Sulfa antibiotics, Chantix [varenicline tartrate], Fish allergy, Hygroton [chlorthalidone], Penicillins, and Latex   History: Past Medical History:  Diagnosis Date   Allergy    COPD (chronic obstructive pulmonary disease) (HCC)    Depression    GERD (gastroesophageal reflux disease)    Hernia, abdominal    History of chicken pox    Hyperlipidemia    Hypertension    Internal hemorrhoids with Grade 3 prolapse and bleeding 06/19/2013   Osteoarthritis    Personal history of colonic adenomas 06/25/2013   Past Surgical History:  Procedure Laterality Date   ABDOMINAL HYSTERECTOMY  18yrs ago   due to heavy bleeding and fibroids    COLONOSCOPY     HEMORRHOID BANDING  2014   HEMORRHOID SURGERY  76yrs agao   RIGHT HEART CATH N/A 10/12/2022   Procedure: RIGHT HEART CATH;  Surgeon: Orbie Pyo,  MD;  Location: [facility];  Service: Cardiovascular;  Laterality: N/A;   UMBILICAL HERNIA REPAIR  06/2015   Family History  Problem Relation Age of Onset   Cancer Father        prostate   Hypertension Mother    Diabetes Mother    Hypertension Sister    Heart disease Maternal Uncle    Stroke Maternal Grandmother    Hypertension Maternal Grandmother    Hypertension Sister    Colon cancer Neg Hx    Stomach cancer Neg Hx    Rectal cancer Neg Hx    Colon polyps Neg Hx    Social History   Socioeconomic History   Marital status: Married    Spouse name: Not on file   Number of children: Not on file   Years of education: 12+   Highest education level: Not on file  Occupational History   Occupation: PCA    Employer: [employer]  Tobacco Use   Smoking status: Former    Current packs/day: 0.00    Average packs/day: 0.5 packs/day for 40.0 years (20.0 ttl pk-yrs)    Types: Cigarettes    Start date: 07/1981    Quit date: 07/2021    Years since quitting: 1.8    Passive exposure: Never   Smokeless tobacco: Never   Tobacco comments:    Quit [id number]   Vaping Use   Vaping status: Former  Substance and Sexual Activity   Alcohol use: Yes    Comment: occasional   Drug use: No   Sexual activity: Not Currently  Other Topics Concern   Not on file  Social History Narrative   Regular exercise-no Caffeine Use-   Social Determinants of Health   Financial Resource Strain: Low Risk  (06/20/2023)   Overall Financial Resource Strain (CARDIA)    Difficulty of Paying Living Expenses: Not hard at all  Food Insecurity: No Food Insecurity (06/20/2023)   Hunger Vital Sign    Worried About Running Out of Food in the Last Year: Never true    Ran Out of Food in the Last Year: Never true  Transportation Needs: No Transportation Needs (06/20/2023)   PRAPARE - Administrator, Civil Service (Medical): No    Lack of Transportation (Non-Medical): No  Physical Activity: Insufficiently Active (06/20/2023)   Exercise Vital Sign    Days of Exercise per Week: 1 day    Minutes of Exercise per Session: 10 min  Stress: No Stress Concern Present (06/20/2023)   [employer] of Occupational Health - Occupational Stress Questionnaire    Feeling of Stress : Not at all  Social Connections: Socially Isolated (06/20/2023)   Social Connection and Isolation Panel [NHANES]    Frequency of Communication with Friends and Family: More than three times a week    Frequency of Social Gatherings with Friends and Family: More than three times a week    Attends Religious Services: Never    Database administrator or Organizations: No    Attends Banker Meetings: Never    Marital Status: Separated    Tobacco Counseling Counseling given: Not Answered Tobacco comments: Quit [id number]   Clinical Intake:  Pre-visit preparation completed: Yes  Pain : No/denies pain  Diabetes: No  How often do you need to have someone help you when you read instructions, pamphlets, or other written materials from your doctor or pharmacy?: 1 - Never  Interpreter  Needed?: No  Information entered by :: Kandis Fantasia  LPN   Activities of Daily Living    06/20/2023   11:38 AM 08/19/2022   10:30 AM  In your present state of health, do you have any difficulty performing the following activities:  Hearing? 0 0  Vision? 0 0  Difficulty concentrating or making decisions? 0 0  Walking or climbing stairs? 0 0  Dressing or bathing? 0 0  Doing errands, shopping? 0 0  Preparing Food and eating ? N N  Using the Toilet? N N  In the past six months, have you accidently leaked urine? N N  Do you have problems with loss of bowel control? N N  Managing your Medications? N N  Managing your Finances? N N  Housekeeping or managing your Housekeeping? N N    Patient Care Team: Marcine Matar, MD as PCP - General (Internal Medicine) Orbie Pyo, MD as PCP - Cardiology (Cardiology) Glenford Peers, OD as Referring Physician (Optometry) Luciano Cutter, MD as Consulting Physician (Pulmonary Disease) Dorisann Frames, MD as Referring Physician (Endocrinology)  Indicate any recent Medical Services you may have received from other than [facility] providers in the past year (date may be approximate).     Assessment:   This is a routine wellness examination for [place].  Hearing/Vision screen Hearing Screening - Comments:: Denies hearing difficulties   Vision Screening - Comments:: Wears rx glasses - up to date with routine eye exams with Dr. Harriette Bouillon    Dietary issues and exercise activities discussed:     Goals Addressed             This Visit's Progress    Remain active and independent        Depression Screen    06/20/2023   12:53 PM 04/04/2023    9:32 AM 03/06/2023   10:28 AM 02/20/2023   10:36 AM 10/17/2022   10:11 AM 08/19/2022   10:30 AM 08/16/2022   11:15 AM  PHQ 2/9 Scores  PHQ - 2 Score 0 0 0 0 0 0 0  PHQ- 9 Score 0 0 0 0 0  0    Fall Risk    06/20/2023   11:38 AM 04/04/2023    9:29 AM 03/06/2023   10:28 AM 02/20/2023    10:32 AM 10/17/2022   10:07 AM  Fall Risk   Falls in the past year? 0 0 0 0 0  Number falls in past yr: 0 0 0 0 0  Injury with Fall? 0 0 0 0 0  Risk for fall due to : History of fall(s);Impaired balance/gait;Impaired mobility No Fall Risks No Fall Risks No Fall Risks No Fall Risks  Follow up Falls prevention discussed;Education provided;Falls evaluation completed        MEDICARE RISK AT HOME: Medicare Risk at Home Any stairs in or around the home?: No If so, are there any without handrails?: No Home free of loose throw rugs in walkways, pet beds, electrical cords, etc?: Yes Adequate lighting in your home to reduce risk of falls?: Yes Life alert?: No Use of a cane, walker or w/c?: No Grab bars in the bathroom?: No Shower chair or bench in shower?: No Elevated toilet seat or a handicapped toilet?: No  TIMED UP AND GO:  Was the test performed?  No    Cognitive Function:        06/20/2023   12:58 PM 08/19/2022   10:39 AM  6CIT Screen  What Year? 0 points 0 points  What month? 0 points 0 points  What time? 0 points 0 points  Count back from 20 0 points 0 points  Months in reverse 2 points 4 points  Repeat phrase 0 points 2 points  Total Score 2 points 6 points    Immunizations Immunization History  Administered Date(s) Administered   Fluad Quad(high Dose 65+) 08/16/2022   Influenza Split 07/10/2017   Influenza,inj,Quad PF,6+ Mos 08/14/2014, 09/14/2020, 09/13/2021   PFIZER Comirnaty(Gray Top)Covid-19 Tri-Sucrose Vaccine 01/25/2021   PFIZER(Purple Top)SARS-COV-2 Vaccination 05/12/2020, 06/02/2020   PNEUMOCOCCAL CONJUGATE-20 08/24/2021   Respiratory Syncytial Virus Vaccine,Recomb Aduvanted(Arexvy) 08/16/2022   Tdap 08/14/2014    TDAP status: Up to date  Flu Vaccine status: Due, Education has been provided regarding the importance of this vaccine. Advised may receive this vaccine at local pharmacy or Health Dept. Aware to provide a copy of the vaccination record if  obtained from local pharmacy or Health Dept. Verbalized acceptance and understanding.  Pneumococcal vaccine status: Up to date  Covid-19 vaccine status: Information provided on how to obtain vaccines.   Qualifies for Shingles Vaccine? Yes   Zostavax completed No   Shingrix Completed?: No.    Education has been provided regarding the importance of this vaccine. Patient has been advised to call insurance company to determine out of pocket expense if they have not yet received this vaccine. Advised may also receive vaccine at local pharmacy or Health Dept. Verbalized acceptance and understanding.  Screening Tests Health Maintenance  Topic Date Due   OPHTHALMOLOGY EXAM  Never done   DEXA SCAN  Never done   COVID-19 Vaccine (4 - 2023-24 season) 07/01/2022   INFLUENZA VACCINE  06/01/2023   Colonoscopy  07/15/2023   Zoster Vaccines- Shingrix (1 of 2) 10/18/2023 (Originally 01/11/1975)   HEMOGLOBIN A1C  08/29/2023   MAMMOGRAM  02/02/2024   Diabetic kidney evaluation - eGFR measurement  03/09/2024   FOOT EXAM  04/02/2024   Diabetic kidney evaluation - Urine ACR  04/03/2024   Lung Cancer Screening  05/02/2024   Medicare Annual Wellness (AWV)  06/19/2024   DTaP/Tdap/Td (2 - Td or Tdap) 08/14/2024   Pneumonia Vaccine 23+ Years old  Completed   Hepatitis C Screening  Completed   HPV VACCINES  Aged Out    Health Maintenance  Health Maintenance Due  Topic Date Due   OPHTHALMOLOGY EXAM  Never done   DEXA SCAN  Never done   COVID-19 Vaccine (4 - 2023-24 season) 07/01/2022   INFLUENZA VACCINE  06/01/2023   Colonoscopy  07/15/2023    Colorectal cancer screening: Type of screening: Colonoscopy. Completed 07/14/20. Repeat every 3 years  Mammogram status: Ordered today. Pt provided with contact info and advised to call to schedule appt.   Bone Density status: Ordered 08/27/22. Pt provided with contact info and advised to call to schedule appt.  Lung Cancer Screening: (Low Dose CT Chest  recommended if Age 60-80 years, 20 pack-year currently smoking OR have quit w/in 15years.) does qualify.   Lung Cancer Screening Referral: ordered 05/08/23  Additional Screening:  Hepatitis C Screening: does qualify; Completed 01/15/16  Vision Screening: Recommended annual ophthalmology exams for early detection of glaucoma and other disorders of the eye. Is the patient up to date with their annual eye exam?  Yes  Who is the provider or what is the name of the office in which the patient attends annual eye exams? Dr. Harriette Bouillon  If pt is not established with a provider, would they like to be referred to a provider to establish care? No .  Dental Screening: Recommended annual dental exams for proper oral hygiene  Diabetic Foot Exam: Diabetic Foot Exam: Completed 04/03/23  Community Resource Referral / Chronic Care Management: CRR required this visit?  No   CCM required this visit?  No     Plan:     I have personally reviewed and noted the following in the patient's chart:   Medical and social history Use of alcohol, tobacco or illicit drugs  Current medications and supplements including opioid prescriptions. Patient is not currently taking opioid prescriptions. Functional ability and status Nutritional status Physical activity Advanced directives List of other physicians Hospitalizations, surgeries, and ER visits in previous 12 months Vitals Screenings to include cognitive, depression, and falls Referrals and appointments  In addition, I have reviewed and discussed with patient certain preventive protocols, quality metrics, and best practice recommendations. A written personalized care plan for preventive services as well as general preventive health recommendations were provided to patient.     Kandis Fantasia [place], [place]   [id number]/[id number]   After Visit Summary: (MyChart) Due to this being a telephonic visit, the after visit summary with patients personalized plan was offered  to patient via MyChart   Nurse Notes: No concerns at this time

## 2023-06-20 NOTE — Patient Instructions (Signed)
Krystal Allen , Thank you for taking time to come for your Medicare Wellness Visit. I appreciate your ongoing commitment to your health goals. Please review the following plan we discussed and let me know if I can assist you in the future.   Referrals/Orders/Follow-Ups/Clinician Recommendations: Aim for 30 minutes of exercise or brisk walking, 6-8 glasses of water, and 5 servings of fruits and vegetables each day.  You have an order for:  []   2D Mammogram  [x]   3D Mammogram  [x]   Bone Density     Please call for appointment:   [facility] [address] [place], [place] [address] [phone]  Make sure to wear two-piece clothing.  No lotions, powders, or deodorants the day of the appointment. Make sure to bring picture ID and insurance card.  Bring list of medications you are currently taking including any supplements.   Schedule your [place] screening mammogram through MyChart!   Log into your MyChart account.  Go to 'Visit' (or 'Appointments' if on mobile App) --> Schedule an Appointment  Under 'Select a Reason for Visit' choose the Mammogram Screening option.  Complete the pre-visit questions and select the time and place that best fits your schedule.   This is a list of the screening recommended for you and due dates:  Health Maintenance  Topic Date Due   Eye exam for diabetics  Never done   DEXA scan (bone density measurement)  Never done   COVID-19 Vaccine (4 - 2023-24 season) 07/01/2022   Flu Shot  06/01/2023   Colon Cancer Screening  07/15/2023   Zoster (Shingles) Vaccine (1 of 2) 10/18/2023*   Hemoglobin A1C  08/29/2023   Mammogram  02/02/2024   Yearly kidney function blood test for diabetes  03/09/2024   Complete foot exam   04/02/2024   Yearly kidney health urinalysis for diabetes  04/03/2024   Screening for Lung Cancer  05/02/2024   Medicare Annual Wellness Visit  06/19/2024   DTaP/Tdap/Td vaccine (2 - Td or Tdap) 08/14/2024    Pneumonia Vaccine  Completed   Hepatitis C Screening  Completed   HPV Vaccine  Aged Out  *Topic was postponed. The date shown is not the original due date.    Advanced directives: (ACP Link)Information on Advanced Care Planning can be found at [facility] Advance Health Care Directives Advance Health Care Directives ([email or web address]   Next Medicare Annual Wellness Visit scheduled for next year: Yes  Preventive Care 65 Years and Older, Female Preventive care refers to lifestyle choices and visits with your health care provider that can promote health and wellness. What does preventive care include? A yearly physical exam. This is also called an annual well check. Dental exams once or twice a year. Routine eye exams. Ask your health care provider how often you should have your eyes checked. Personal lifestyle choices, including: Daily care of your teeth and gums. Regular physical activity. Eating a healthy diet. Avoiding tobacco and drug use. Limiting alcohol use. Practicing safe sex. Taking low-dose aspirin every day. Taking vitamin and mineral supplements as recommended by your health care provider. What happens during an annual well check? The services and screenings done by your health care provider during your annual well check will depend on your age, overall health, lifestyle risk factors, and family history of disease. Counseling  Your health care provider may ask you questions about your: Alcohol use. Tobacco use. Drug use. Emotional well-being. Home and relationship well-being. Sexual activity.  Eating habits. History of falls. Memory and ability to understand (cognition). Work and work Astronomer. Reproductive health. Screening  You may have the following tests or measurements: Height, weight, and BMI. Blood pressure. Lipid and cholesterol levels. These may be checked every 5 years, or more frequently if you are over 86 years old. Skin  check. Lung cancer screening. You may have this screening every year starting at age 31 if you have a 30-pack-year history of smoking and currently smoke or have quit within the past 15 years. Fecal occult blood test (FOBT) of the stool. You may have this test every year starting at age 74. Flexible sigmoidoscopy or colonoscopy. You may have a sigmoidoscopy every 5 years or a colonoscopy every 10 years starting at age 66. Hepatitis C blood test. Hepatitis B blood test. Sexually transmitted disease (STD) testing. Diabetes screening. This is done by checking your blood sugar (glucose) after you have not eaten for a while (fasting). You may have this done every 1-3 years. Bone density scan. This is done to screen for osteoporosis. You may have this done starting at age 60. Mammogram. This may be done every 1-2 years. Talk to your health care provider about how often you should have regular mammograms. Talk with your health care provider about your test results, treatment options, and if necessary, the need for more tests. Vaccines  Your health care provider may recommend certain vaccines, such as: Influenza vaccine. This is recommended every year. Tetanus, diphtheria, and acellular pertussis (Tdap, Td) vaccine. You may need a Td booster every 10 years. Zoster vaccine. You may need this after age 20. Pneumococcal 13-valent conjugate (PCV13) vaccine. One dose is recommended after age 51. Pneumococcal polysaccharide (PPSV23) vaccine. One dose is recommended after age 32. Talk to your health care provider about which screenings and vaccines you need and how often you need them. This information is not intended to replace advice given to you by your health care provider. Make sure you discuss any questions you have with your health care provider. Document Released: 11/13/2015 Document Revised: 07/06/2016 Document Reviewed: 08/18/2015 Elsevier Interactive Patient Education  2017 [employer].  Fall  Prevention in the Home Falls can cause injuries. They can happen to people of all ages. There are many things you can do to make your home safe and to help prevent falls. What can I do on the outside of my home? Regularly fix the edges of walkways and driveways and fix any cracks. Remove anything that might make you trip as you walk through a door, such as a raised step or threshold. Trim any bushes or trees on the path to your home. Use bright outdoor lighting. Clear any walking paths of anything that might make someone trip, such as rocks or tools. Regularly check to see if handrails are loose or broken. Make sure that both sides of any steps have handrails. Any raised decks and porches should have guardrails on the edges. Have any leaves, snow, or ice cleared regularly. Use sand or salt on walking paths during winter. Clean up any spills in your garage right away. This includes oil or grease spills. What can I do in the bathroom? Use night lights. Install grab bars by the toilet and in the tub and shower. Do not use towel bars as grab bars. Use non-skid mats or decals in the tub or shower. If you need to sit down in the shower, use a plastic, non-slip stool. Keep the floor dry. Clean up any water  that spills on the floor as soon as it happens. Remove soap buildup in the tub or shower regularly. Attach bath mats securely with double-sided non-slip rug tape. Do not have throw rugs and other things on the floor that can make you trip. What can I do in the bedroom? Use night lights. Make sure that you have a light by your bed that is easy to reach. Do not use any sheets or blankets that are too big for your bed. They should not hang down onto the floor. Have a firm chair that has side arms. You can use this for support while you get dressed. Do not have throw rugs and other things on the floor that can make you trip. What can I do in the kitchen? Clean up any spills right away. Avoid  walking on wet floors. Keep items that you use a lot in easy-to-reach places. If you need to reach something above you, use a strong step stool that has a grab bar. Keep electrical cords out of the way. Do not use floor polish or wax that makes floors slippery. If you must use wax, use non-skid floor wax. Do not have throw rugs and other things on the floor that can make you trip. What can I do with my stairs? Do not leave any items on the stairs. Make sure that there are handrails on both sides of the stairs and use them. Fix handrails that are broken or loose. Make sure that handrails are as long as the stairways. Check any carpeting to make sure that it is firmly attached to the stairs. Fix any carpet that is loose or worn. Avoid having throw rugs at the top or bottom of the stairs. If you do have throw rugs, attach them to the floor with carpet tape. Make sure that you have a light switch at the top of the stairs and the bottom of the stairs. If you do not have them, ask someone to add them for you. What else can I do to help prevent falls? Wear shoes that: Do not have high heels. Have rubber bottoms. Are comfortable and fit you well. Are closed at the toe. Do not wear sandals. If you use a stepladder: Make sure that it is fully opened. Do not climb a closed stepladder. Make sure that both sides of the stepladder are locked into place. Ask someone to hold it for you, if possible. Clearly mark and make sure that you can see: Any grab bars or handrails. First and last steps. Where the edge of each step is. Use tools that help you move around (mobility aids) if they are needed. These include: Canes. Walkers. Scooters. Crutches. Turn on the lights when you go into a dark area. Replace any light bulbs as soon as they burn out. Set up your furniture so you have a clear path. Avoid moving your furniture around. If any of your floors are uneven, fix them. If there are any pets around  you, be aware of where they are. Review your medicines with your doctor. Some medicines can make you feel dizzy. This can increase your chance of falling. Ask your doctor what other things that you can do to help prevent falls. This information is not intended to replace advice given to you by your health care provider. Make sure you discuss any questions you have with your health care provider. Document Released: 08/13/2009 Document Revised: 03/24/2016 Document Reviewed: 11/21/2014 Elsevier Interactive Patient Education  2017 [employer].

## 2023-06-24 DIAGNOSIS — R0602 Shortness of breath: Secondary | ICD-10-CM | POA: Diagnosis not present

## 2023-06-30 ENCOUNTER — Other Ambulatory Visit: Payer: Self-pay

## 2023-07-03 ENCOUNTER — Other Ambulatory Visit (HOSPITAL_COMMUNITY): Payer: Self-pay

## 2023-07-04 ENCOUNTER — Ambulatory Visit: Payer: 59 | Admitting: Podiatry

## 2023-07-06 ENCOUNTER — Other Ambulatory Visit (HOSPITAL_COMMUNITY): Payer: Self-pay

## 2023-07-17 ENCOUNTER — Ambulatory Visit (INDEPENDENT_AMBULATORY_CARE_PROVIDER_SITE_OTHER): Payer: 59 | Admitting: Podiatry

## 2023-07-17 DIAGNOSIS — Z91199 Patient's noncompliance with other medical treatment and regimen due to unspecified reason: Secondary | ICD-10-CM

## 2023-07-17 NOTE — Progress Notes (Signed)
No show

## 2023-07-20 ENCOUNTER — Other Ambulatory Visit: Payer: Self-pay | Admitting: Internal Medicine

## 2023-07-21 ENCOUNTER — Other Ambulatory Visit: Payer: Self-pay

## 2023-07-21 ENCOUNTER — Other Ambulatory Visit (HOSPITAL_COMMUNITY): Payer: Self-pay

## 2023-07-21 MED ORDER — OZEMPIC (0.25 OR 0.5 MG/DOSE) 2 MG/3ML ~~LOC~~ SOPN
0.5000 mg | PEN_INJECTOR | SUBCUTANEOUS | 2 refills | Status: DC
Start: 2023-07-21 — End: 2023-08-04
  Filled 2023-07-21: qty 3, 28d supply, fill #0

## 2023-07-22 ENCOUNTER — Other Ambulatory Visit (HOSPITAL_COMMUNITY): Payer: Self-pay

## 2023-07-25 DIAGNOSIS — R0602 Shortness of breath: Secondary | ICD-10-CM | POA: Diagnosis not present

## 2023-08-04 ENCOUNTER — Other Ambulatory Visit (HOSPITAL_COMMUNITY): Payer: Self-pay

## 2023-08-04 ENCOUNTER — Encounter: Payer: Self-pay | Admitting: Internal Medicine

## 2023-08-04 ENCOUNTER — Other Ambulatory Visit (HOSPITAL_BASED_OUTPATIENT_CLINIC_OR_DEPARTMENT_OTHER): Payer: Self-pay | Admitting: Pulmonary Disease

## 2023-08-04 ENCOUNTER — Ambulatory Visit: Payer: 59 | Attending: Internal Medicine | Admitting: Internal Medicine

## 2023-08-04 ENCOUNTER — Other Ambulatory Visit: Payer: Self-pay

## 2023-08-04 DIAGNOSIS — E1169 Type 2 diabetes mellitus with other specified complication: Secondary | ICD-10-CM

## 2023-08-04 DIAGNOSIS — N1832 Chronic kidney disease, stage 3b: Secondary | ICD-10-CM

## 2023-08-04 DIAGNOSIS — Z9981 Dependence on supplemental oxygen: Secondary | ICD-10-CM

## 2023-08-04 DIAGNOSIS — Z6841 Body Mass Index (BMI) 40.0 and over, adult: Secondary | ICD-10-CM

## 2023-08-04 DIAGNOSIS — E785 Hyperlipidemia, unspecified: Secondary | ICD-10-CM

## 2023-08-04 DIAGNOSIS — Z23 Encounter for immunization: Secondary | ICD-10-CM

## 2023-08-04 DIAGNOSIS — I152 Hypertension secondary to endocrine disorders: Secondary | ICD-10-CM

## 2023-08-04 DIAGNOSIS — Z1211 Encounter for screening for malignant neoplasm of colon: Secondary | ICD-10-CM

## 2023-08-04 DIAGNOSIS — E1159 Type 2 diabetes mellitus with other circulatory complications: Secondary | ICD-10-CM | POA: Diagnosis not present

## 2023-08-04 DIAGNOSIS — J9611 Chronic respiratory failure with hypoxia: Secondary | ICD-10-CM

## 2023-08-04 DIAGNOSIS — J449 Chronic obstructive pulmonary disease, unspecified: Secondary | ICD-10-CM

## 2023-08-04 DIAGNOSIS — Z1231 Encounter for screening mammogram for malignant neoplasm of breast: Secondary | ICD-10-CM

## 2023-08-04 DIAGNOSIS — Z7985 Long-term (current) use of injectable non-insulin antidiabetic drugs: Secondary | ICD-10-CM

## 2023-08-04 LAB — POCT GLYCOSYLATED HEMOGLOBIN (HGB A1C): HbA1c, POC (controlled diabetic range): 5.9 % (ref 0.0–7.0)

## 2023-08-04 LAB — GLUCOSE, POCT (MANUAL RESULT ENTRY): POC Glucose: 114 mg/dL — AB (ref 70–99)

## 2023-08-04 MED ORDER — SEMAGLUTIDE (1 MG/DOSE) 4 MG/3ML ~~LOC~~ SOPN
1.0000 mg | PEN_INJECTOR | SUBCUTANEOUS | 6 refills | Status: DC
Start: 2023-08-04 — End: 2023-12-08
  Filled 2023-08-04 (×2): qty 3, 28d supply, fill #0
  Filled 2023-08-19 – 2023-08-25 (×2): qty 3, 28d supply, fill #1
  Filled 2023-09-16: qty 3, 28d supply, fill #2
  Filled 2023-10-28: qty 3, 28d supply, fill #3
  Filled 2023-11-22: qty 3, 28d supply, fill #4

## 2023-08-04 NOTE — Progress Notes (Signed)
Patient ID: Krystal Allen, female    DOB: August 06, 1956  MRN: [id number]  CC: Hypertension (HTN & DM f/u. /No questions / concerns. Valentino Hue to flu vax. )   Subjective: Krystal Allen is a 67 y.o. female who presents for chronic ds management. Her concerns today include:  Pt with hx of HTN, HL, aortic atherosclerosis, COPD, ILD (pulmonary Langerhans' cell cell histiocytosis), nocturnal hypoxia followed by pulmonary, GERD, CKD 2-3, DM/obesity, former tob dep, DJD c-spine, spinal stenosis, stress incont, adenomatous colon polyp.     Discussed the use of AI scribe software for clinical note transcription with the patient, who gave verbal consent to proceed.  History of Present Illness   Krystal Allen, a patient with a history of diabetes, emphysema, CKD, and hypertension, presents for a follow-up visit.   DM: Results for orders placed or performed in visit on 08/04/23  POCT glycosylated hemoglobin (Hb A1C)  Result Value Ref Range   Hemoglobin A1C     HbA1c POC (<> result, manual entry)     HbA1c, POC (prediabetic range)     HbA1c, POC (controlled diabetic range) 5.9 0.0 - 7.0 %  POCT glucose (manual entry)  Result Value Ref Range   POC Glucose 114 (A) 70 - 99 mg/dl   She has been on Ozempic 0.5mg  for diabetes management since April and reports tolerating the medication well with no side effects such as nausea, vomiting, or abdominal pain.  She has not had to use Humalog insulin.  Saw Dr. Talmage Nap in June.  Does not feel the need to go back because she feels she is not doing anything different from what I am doing.   She notes a decrease in appetite and a noticeable difference in her clothing size, indicating weight loss. She has also started a 10-minute exercise routine on YouTube every other day. Her weight has decreased from 261 pounds in June to 249 pounds at the current visit.  Would like to try increased dose of Ozempic.  COPD/Chronic Hypoxia: Saw her pulmonologist Dr. Everardo All in June of this  year.  PFTs ordered but pt has not received appt. -managing her emphysema with Breo and Spiriva inhalers and reports no major flare-ups. However, she mentions coughing up green or yellow phlegm, especially after waking up or after using her inhalers. She has not been using her prescribed 2 liters of oxygen at night, as she doesn't feel the need for it unless she has a cold.  Hypertension: managed with hydrochlorothiazide 25mg , hydralazine 10mg  twice a day, and furosemide 20mg  once a day.  Takes potassium supplement 20 mEq twice a day as prescribed.  She reports a recent episode of low blood pressure and occasional mild headaches which she feels is related to allergies.sinuses. Not monitoring BP She also reports a suspected decrease in leg swelling and denies any chest pain or significant shortness of breath.   HL/aortic atherosclerosis: She is on Repatha and atorvastatin for cholesterol management     CKD 3: Reports having seen her nephrologist Dr. Melanee Spry but does not recall when.  Last GFR in the system in May was 32.  Patient Active Problem List   Diagnosis Date Noted   Anxiety 04/03/2023   History of panic attacks 04/03/2023   Ventral hernia 04/03/2023   Chemosis of conjunctiva, bilateral 02/27/2023   Type 2 diabetes mellitus with chronic kidney disease, without long-term current use of insulin (HCC) 02/27/2023   CKD stage 3b, GFR 30-44 ml/min (HCC) - Baseline Scr 1.5 02/27/2023  Morbid obesity (HCC) 03/09/2022   Seasonal allergies 02/07/2022   Mastitis, right, acute 12/23/2021   Influenza vaccine needed 09/14/2020   Tobacco dependence 05/12/2020   Aortic atherosclerosis (HCC) 05/12/2020   Adenomatous polyp of colon 05/12/2020   Langerhan's cell histiocytosis (HCC) 01/26/2018   Cigarette smoker 01/26/2018   Osteoarthritis of cervical spine 12/22/2017   Prediabetes 12/22/2017   Pain of left calf 04/06/2017   Centrilobular emphysema (HCC) 02/23/2016   Obesity (BMI 30-39.9)  01/15/2016   Bilateral shoulder pain 01/15/2016   Esophageal reflux 01/15/2016   Tobacco abuse 05/13/2015   Lung nodule 08/14/2014   Impaired fasting glucose 08/14/2014   Hyperlipidemia 08/14/2014   History of colonic polyps 06/25/2013   Hypertension associated with type 2 diabetes mellitus (HCC) 05/20/2013     Current Outpatient Medications on File Prior to Visit  Medication Sig Dispense Refill   Accu-Chek Softclix Lancets lancets Use to check blood sugar three times daily. E11.69 100 each 6   acetaminophen (TYLENOL 8 HOUR) 650 MG CR tablet Take 1 tablet (650 mg total) by mouth 2 (two) times daily as needed for pain. 60 tablet 5   albuterol (PROAIR HFA) 108 (90 Base) MCG/ACT inhaler Inhale 2 puffs into the lungs every 4 (four) hours as needed for wheezing or shortness of breath. 6.7 g 0   albuterol (PROVENTIL) (2.5 MG/3ML) 0.083% nebulizer solution TAKE 3 MLS BY NEBULIZATION EVERY 6 (SIX) HOURS AS NEEDED FOR WHEEZING OR SHORTNESS OF BREATH. 90 mL 1   aspirin EC 81 MG tablet Take 1 tablet (81 mg total) by mouth daily. 100 tablet 1   atorvastatin (LIPITOR) 80 MG tablet Take 1 tablet (80 mg total) by mouth daily. 90 tablet 3   Blood Glucose Monitoring Suppl (ACCU-CHEK GUIDE) w/Device KIT Use to check blood sugar three times daily. E11.69 1 kit 0   cetirizine (ZYRTEC) 10 MG tablet Take 1 tablet (10 mg total) by mouth daily. 30 tablet 11   Continuous Glucose Receiver (FREESTYLE LIBRE 2 READER) DEVI Use to check blood sugar continuously throughout the day. Change sensors once every 14 days. 1 each 0   Continuous Glucose Sensor (FREESTYLE LIBRE 2 SENSOR) MISC Use to check blood sugar continuously throughout the day. 2 each 6   diphenhydrAMINE (BENADRYL) 25 mg capsule Take 25-50 mg by mouth every 6 (six) hours as needed for itching or allergies.     Elastic Bandages & Supports (MEDICAL COMPRESSION STOCKINGS) MISC R60.0 1 each 0   EPINEPHrine 0.3 mg/0.3 mL IJ SOAJ injection Inject 0.3 mg into the  muscle as needed for anaphylaxis. 2 each 2   Evolocumab (REPATHA SURECLICK) 140 MG/ML SOAJ Inject 140 mg into the skin every 14 (fourteen) days. 6 mL 3   ezetimibe (ZETIA) 10 MG tablet Take 1 tablet (10 mg total) by mouth daily. 90 tablet 3   famotidine (PEPCID) 20 MG tablet Take 1 tablet (20 mg total) by mouth 2 (two) times daily as needed (hives). 60 tablet 5   fexofenadine (ALLEGRA ALLERGY) 180 MG tablet Take 1 tablet (180 mg total) by mouth 2 (two) times daily as needed (hives). 60 tablet 5   fluticasone (FLONASE) 50 MCG/ACT nasal spray Place 2 sprays into both nostrils daily. 16 g 5   fluticasone furoate-vilanterol (BREO ELLIPTA) 200-25 MCG/ACT AEPB Inhale 1 puff into the lungs once daily. 60 each 4   furosemide (LASIX) 20 MG tablet Take 1 tablet (20 mg total) by mouth daily as needed (for weight gain of 2-3 lbs overnight or  swelling in legs/ankles). 90 tablet 2   glucose blood (ACCU-CHEK GUIDE) test strip Use to check blood sugar three times daily. E11.69 100 each 12   hydrALAZINE (APRESOLINE) 10 MG tablet Take 1 tablet (10 mg total) by mouth 2 (two) times daily. 180 tablet 1   hydrochlorothiazide (HYDRODIURIL) 25 MG tablet Take 1 tablet (25 mg total) by mouth daily. 90 tablet 1   hydrOXYzine (ATARAX) 25 MG tablet Take 1 tablet (25 mg total) by mouth every 8 (eight) hours as needed for up to 15 doses for itching. 15 tablet 0   Insulin Pen Needle 32G X 4 MM MISC Use 4 times daily with insulin pen. 100 each 0   Olopatadine HCl 0.2 % SOLN Place 1 drop into both eyes daily as needed (allergies). 2.5 mL 5   omeprazole (PRILOSEC) 20 MG capsule Take 1 capsule (20 mg total) by mouth daily. 90 capsule 3   oxybutynin (DITROPAN XL) 5 MG 24 hr tablet Take 1 tablet (5 mg total) by mouth at bedtime. (Patient taking differently: Take 5 mg by mouth daily as needed (for urinary incontinence).) 30 tablet 0   potassium chloride SA (KLOR-CON M) 20 MEQ tablet Take 1 tablet (20 mEq total) by mouth 2 (two) times  daily. 180 tablet 2   Respiratory Therapy Supplies (FLUTTER) DEVI Use as directed 1 each 0   sodium chloride (OCEAN) 0.65 % nasal spray Place 2 sprays into both nostrils every 4 (four) hours. (Patient taking differently: Place 2 sprays into the nose every 4 (four) hours as needed for congestion.) 44 mL 0   Tiotropium Bromide Monohydrate (SPIRIVA RESPIMAT) 2.5 MCG/ACT AERS Inhale 2 puffs into the lungs daily. 4 g 4   triamcinolone cream (KENALOG) 0.1 % Apply 1 Application topically 2 (two) times daily. 30 g 0   No current facility-administered medications on file prior to visit.    Allergies  Allergen Reactions   Sulfa Antibiotics Anaphylaxis and Hives   Chantix [Varenicline Tartrate]     Bad dreams   Fish Allergy Other (See Comments)    Severe stomach cramps    Hygroton [Chlorthalidone]     Swelling or hives   Penicillins Other (See Comments)    Pt took Amoxicillin in 2019 without issue, reports PCN caused rash in the past   Latex Rash and Other (See Comments)    Dry skin    Social History   Socioeconomic History   Marital status: Married    Spouse name: Not on file   Number of children: Not on file   Years of education: 12+   Highest education level: Not on file  Occupational History   Occupation: PCA    Employer: [employer]  Tobacco Use   Smoking status: Former    Current packs/day: 0.00    Average packs/day: 0.5 packs/day for 40.0 years (20.0 ttl pk-yrs)    Types: Cigarettes    Start date: 07/1981    Quit date: 07/2021    Years since quitting: 2.0    Passive exposure: Never   Smokeless tobacco: Never   Tobacco comments:    Quit [id number]  Vaping Use   Vaping status: Former  Substance and Sexual Activity   Alcohol use: Yes    Comment: occasional   Drug use: No   Sexual activity: Not Currently  Other Topics Concern   Not on file  Social History Narrative   Regular exercise-no Caffeine Use-   Social Determinants of Health   Financial Resource Strain: Low  Risk   (06/20/2023)   Overall Financial Resource Strain (CARDIA)    Difficulty of Paying Living Expenses: Not hard at all  Food Insecurity: No Food Insecurity (06/20/2023)   Hunger Vital Sign    Worried About Running Out of Food in the Last Year: Never true    Ran Out of Food in the Last Year: Never true  Transportation Needs: No Transportation Needs (06/20/2023)   PRAPARE - Administrator, Civil Service (Medical): No    Lack of Transportation (Non-Medical): No  Physical Activity: Insufficiently Active (06/20/2023)   Exercise Vital Sign    Days of Exercise per Week: 1 day    Minutes of Exercise per Session: 10 min  Stress: No Stress Concern Present (06/20/2023)   [employer] of Occupational Health - Occupational Stress Questionnaire    Feeling of Stress : Not at all  Social Connections: Socially Isolated (06/20/2023)   Social Connection and Isolation Panel [NHANES]    Frequency of Communication with Friends and Family: More than three times a week    Frequency of Social Gatherings with Friends and Family: More than three times a week    Attends Religious Services: Never    Database administrator or Organizations: No    Attends Banker Meetings: Never    Marital Status: Separated  Intimate Partner Violence: Not At Risk (06/20/2023)   Humiliation, Afraid, Rape, and Kick questionnaire    Fear of Current or Ex-Partner: No    Emotionally Abused: No    Physically Abused: No    Sexually Abused: No    Family History  Problem Relation Age of Onset   Cancer Father        prostate   Hypertension Mother    Diabetes Mother    Hypertension Sister    Heart disease Maternal Uncle    Stroke Maternal Grandmother    Hypertension Maternal Grandmother    Hypertension Sister    Colon cancer Neg Hx    Stomach cancer Neg Hx    Rectal cancer Neg Hx    Colon polyps Neg Hx     Past Surgical History:  Procedure Laterality Date   ABDOMINAL HYSTERECTOMY  40yrs ago   due to  heavy bleeding and fibroids    COLONOSCOPY     HEMORRHOID BANDING  2014   HEMORRHOID SURGERY  11yrs agao   RIGHT HEART CATH N/A 10/12/2022   Procedure: RIGHT HEART CATH;  Surgeon: Orbie Pyo, MD;  Location: [facility];  Service: Cardiovascular;  Laterality: N/A;   UMBILICAL HERNIA REPAIR  06/2015    ROS: Review of Systems Negative except as stated above  PHYSICAL EXAM: BP 116/79   Pulse 77   Ht 5\' 6"  (1.676 m)   Wt 249 lb (112.9 kg)   SpO2 98%   BMI 40.19 kg/m   Wt Readings from Last 3 Encounters:  08/04/23 249 lb (112.9 kg)  06/20/23 256 lb (116.1 kg)  05/05/23 256 lb 12.8 oz (116.5 kg)    Physical Exam  General appearance - alert, well appearing, older African-American female and in no distress Mental status - normal mood, behavior, speech, dress, motor activity, and thought processes Neck - supple, no significant adenopathy Chest - clear to auscultation, no wheezes, rales or rhonchi, symmetric air entry Heart - normal rate, regular rhythm, normal S1, S2, no murmurs, rubs, clicks or gallops Extremities - peripheral pulses normal, no pedal edema, no clubbing or cyanosis  Latest Ref Rng & Units 03/10/2023   10:27 AM 02/27/2023    9:06 AM 02/26/2023    8:33 PM  CMP  Glucose 70 - 99 mg/dL 89  409  811   BUN 8 - 27 mg/dL 29  13  14    Creatinine 0.57 - 1.00 mg/dL 9.14  7.82  9.56   Sodium 134 - 144 mmol/L 139  136  142   Potassium 3.5 - 5.2 mmol/L 3.9  3.6  3.2   Chloride 96 - 106 mmol/L 99  102  104   CO2 20 - 29 mmol/L 24  20    Calcium 8.7 - 10.3 mg/dL 9.4  8.9     Lipid Panel     Component Value Date/Time   CHOL 104 12/02/2022 0909   TRIG 102 12/02/2022 0909   HDL 52 12/02/2022 0909   CHOLHDL 2.0 12/02/2022 0909   CHOLHDL 3.7 08/17/2021 0340   VLDL 12 08/17/2021 0340   LDLCALC 33 12/02/2022 0909   LDLDIRECT 190.1 04/16/2013 1531    CBC    Component Value Date/Time   WBC 6.5 02/26/2023 1849   RBC 4.91 02/26/2023 1849   HGB 13.9  02/26/2023 2033   HGB 14.4 10/03/2022 1120   HCT 41.0 02/26/2023 2033   HCT 41.8 10/03/2022 1120   PLT 281 02/26/2023 1849   PLT 260 10/03/2022 1120   MCV 88.8 02/26/2023 1849   MCV 85 10/03/2022 1120   MCH 28.5 02/26/2023 1849   MCHC 32.1 02/26/2023 1849   RDW 17.9 (H) 02/26/2023 1849   RDW 17.9 (H) 10/03/2022 1120   LYMPHSABS 1.9 02/26/2023 1849   LYMPHSABS 2.2 02/10/2020 1000   MONOABS 0.3 02/26/2023 1849   EOSABS 0.0 02/26/2023 1849   EOSABS 0.2 02/10/2020 1000   BASOSABS 0.0 02/26/2023 1849   BASOSABS 0.1 02/10/2020 1000    ASSESSMENT AND PLAN: 1. Type 2 diabetes mellitus with morbid obesity (HCC) A1c is at goal. She is pleased with weight loss that she has achieved so far since being on Ozempic.  She has lost about 12 pounds.  She would like to try higher dose.  Since she is tolerated the medicine so far, I think it is reasonable to increase to 1 mg once a week.  Advised to watch out and report any side effects like vomiting, abdominal pain, severe constipation or diarrhea.  She expressed understanding.  Encouraged to monitor blood sugars at least once a day. Commended her on trying to move more.  Encouraged her to keep it up. - POCT glycosylated hemoglobin (Hb A1C) - POCT glucose (manual entry) - Semaglutide, 1 MG/DOSE, 4 MG/3ML SOPN; Inject 1 mg as directed once a week.  Dispense: 3 mL; Refill: 6 - Basic Metabolic Panel  2. Long-term (current) use of injectable non-insulin antidiabetic drugs See #1 above.  3. Hyperlipidemia due to type 2 diabetes mellitus (HCC) Continue atorvastatin 80 mg daily and Repatha  4. Hypertension associated with type 2 diabetes mellitus (HCC) At goal.  Continue HCTZ 25 mg daily, furosemide 20 mg daily and hydralazine 10 mg twice a day.  5. Stage 3b chronic kidney disease (HCC) Continue to monitor.  Will check chemistry today - Basic Metabolic Panel  6. Chronic obstructive pulmonary disease, unspecified COPD type (HCC) Stable on Breo and  Spiriva. May need to call pulmonology to let them know that she has not received appointment for PFTs that were ordered.  7. Chronic hypoxic respiratory failure, on home oxygen therapy (HCC) Using O2 now  only as needed.  Patient reports that pulmonologist had ordered overnight pulse oximetry but her medical supply company will not do it because she owes them some money.  8. Encounter for immunization - Flu Vaccine Trivalent High Dose (Fluad)  9. Encounter for screening mammogram for malignant neoplasm of breast - MM 3D SCREENING MAMMOGRAM BILATERAL BREAST; Future  10. Screening for colon cancer Due for repeat colonoscopy.  Referral submitted. - Ambulatory referral to Gastroenterology   Patient was given the opportunity to ask questions.  Patient verbalized understanding of the plan and was able to repeat key elements of the plan.   This documentation was completed using Paediatric nurse.  Any transcriptional errors are unintentional.  Orders Placed This Encounter  Procedures   MM 3D SCREENING MAMMOGRAM BILATERAL BREAST   Flu Vaccine Trivalent High Dose (Fluad)   Basic Metabolic Panel   Ambulatory referral to Gastroenterology   POCT glycosylated hemoglobin (Hb A1C)   POCT glucose (manual entry)     Requested Prescriptions   Signed Prescriptions Disp Refills   Semaglutide, 1 MG/DOSE, 4 MG/3ML SOPN 3 mL 6    Sig: Inject 1 mg as directed once a week.    Return in about 4 months (around 12/05/2023).  Jonah Blue, MD, FACP

## 2023-08-04 NOTE — Addendum Note (Signed)
Addended by: Jonah Blue B on: 08/04/2023 01:50 PM   Modules accepted: Level of Service

## 2023-08-04 NOTE — Patient Instructions (Signed)
Stop Humalog insulin.  I have taken it off your med list.  Increase Ozempic to 1 mg once a week.  Please stop the medication and call if you develop any vomiting, abdominal pain, severe diarrhea.

## 2023-08-05 LAB — BASIC METABOLIC PANEL
BUN/Creatinine Ratio: 13 (ref 12–28)
BUN: 18 mg/dL (ref 8–27)
CO2: 26 mmol/L (ref 20–29)
Calcium: 9.1 mg/dL (ref 8.7–10.3)
Chloride: 101 mmol/L (ref 96–106)
Creatinine, Ser: 1.41 mg/dL — ABNORMAL HIGH (ref 0.57–1.00)
Glucose: 96 mg/dL (ref 70–99)
Potassium: 3.3 mmol/L — ABNORMAL LOW (ref 3.5–5.2)
Sodium: 141 mmol/L (ref 134–144)
eGFR: 41 mL/min/{1.73_m2} — ABNORMAL LOW (ref >59)

## 2023-08-07 ENCOUNTER — Other Ambulatory Visit: Payer: Self-pay

## 2023-08-09 ENCOUNTER — Other Ambulatory Visit: Payer: Self-pay

## 2023-08-09 MED ORDER — ALBUTEROL SULFATE HFA 108 (90 BASE) MCG/ACT IN AERS
2.0000 | INHALATION_SPRAY | RESPIRATORY_TRACT | 0 refills | Status: DC | PRN
  Filled 2023-08-09: qty 6.7, 17d supply, fill #0

## 2023-08-10 NOTE — Progress Notes (Signed)
Cardiology Office Note:   Date:  08/10/2023  ID:  LORMA PASSAFIUME, DOB 06/02/1956, MRN [id number] PCP:  Marcine Matar, MD  [facility] Providers Cardiologist:  Alverda Skeans, MD Referring MD: Marcine Matar, MD  Chief Complaint/Reason for Referral: Cardiology follow-up ASSESSMENT:    1. Coronary artery disease involving native coronary artery of native heart without angina pectoris   2. Type 2 diabetes mellitus with complication, with long-term current use of insulin (HCC)   3. Hypertension associated with diabetes (HCC)   4. Aortic atherosclerosis (HCC)   5. Elevated Lp(a)   6. Stage 3 chronic kidney disease, unspecified whether stage 3a or 3b CKD (HCC)   7. BMI 40.0-44.9, adult (HCC)   8. Chronic obstructive pulmonary disease, unspecified COPD type (HCC)     PLAN:   In order of problems listed above: Coronary artery disease: Continue aspirin, Repatha, Zetia, and strict blood pressure control. Type 2 diabetes mellitus: Continue aspirin, statin, Repatha, start losartan and Jardiance. Hypertension: Aortic atherosclerosis: Continue aspirin, Repatha, and Zetia.  Meeting Elevated Lp(a):  Continue Repatha. CKD stage III:  Start Jardiance and losartan. Elevated BMI: Continue semaglutide. COPD: Continue current therapy; the patient is followed by other providers.           Dispo:  No follow-ups on file.      Medication Adjustments/Labs and Tests Ordered: Current medicines are reviewed at length with the patient today.  Concerns regarding medicines are outlined above.  The following changes have been made:     Labs/tests ordered: No orders of the defined types were placed in this encounter.   Medication Changes: No orders of the defined types were placed in this encounter.   Current medicines are reviewed at length with the patient today.  The patient  concerns regarding medicines.  History of Present Illness:      FOCUSED PROBLEM LIST:    Hypertension Hyperlipidemia; highly elevated LP(a) BMI 42 Aortic atherosclerosis and coronary artery calcification chest CT 2023 Type 2 diabetes on insulin CKD stage III COPD and interstitial lung disease on supplemental oxygen Moderate coronary artery disease with FFR negative mid LAD lesion coronary CTA 2023  May 2023 consultation:  The patient is a 67 y.o. female with the indicated medical history here for recommendations regarding shortness of breath.  She was seen in the emergency department recently for chest pain which was tender to palpation.  She tells me that she has had increasing shortness of breath.  Her albuterol inhaler does not seem to help this.  She does note some wheezing at times.  She is not short of breath at rest.  She has noticed increasing peripheral edema as well as some orthopnea.  She denies any paroxysmal nocturnal dyspnea.  She denies any overt chest pain.  She has had no presyncope, syncope, or palpitations.  She has had no signs or symptoms of stroke.   She does have a long history of smoking and is on chronic supplemental oxygen.  Her last PFTs in our system 10/2017 and her FEV1 was 2.51 with a r moderately severe fusion defect.  Plan: Obtain coronary CT due to dyspnea; start empiric Lasix; increase atorvastatin to 40 mg.   December 2023: In the interim the patient had a coronary CTA which showed a moderate.  Her atorvastatin was increased to 80 mg for an LDL above goal.  She tells me that starting Lasix has helped her peripheral edema.  She has not noticed that her breathing is much improved.  She denies however any orthopnea or paroxysmal nocturnal dyspnea.  She does get short of breath when he exerts herself more than moderately.  She denies any exertional angina.  She has not required any emergency room visits or hospitalizations.  She is scheduled to see Dr. Rennis Golden for elevated LP(a) level soon.  Plan: Refer for right heart catheterization to assess for pulmonary  hypertension.  October 2024:  In the interim the patient had right heart catheterization which showed low filling pressures. She called our office recently with complaints of increasing lower extremity edema. Lower extremity Dopplers were pursued which were negative for DVT. Her Lasix was increased to 20 mg twice daily.  She was seen in our office in March.  Her leg swelling was improved and compression therapy and routine exercise was recommended.          Current Medications: No outpatient medications have been marked as taking for the 08/16/23 encounter (Appointment) with Orbie Pyo, MD.     Review of Systems:   Please see the history of present illness.    All other systems reviewed and are negative.     EKGs/Labs/Other Test Reviewed:   EKG:    EKG Interpretation Date/Time:    Ventricular Rate:    PR Interval:    QRS Duration:    QT Interval:    QTC Calculation:   R Axis:      Text Interpretation:           Risk Assessment/Calculations:         Physical Exam:   VS:  There were no vitals taken for this visit.      Wt Readings from Last 3 Encounters:  08/04/23 249 lb (112.9 kg)  06/20/23 256 lb (116.1 kg)  05/05/23 256 lb 12.8 oz (116.5 kg)      GENERAL:  No apparent distress, AOx3 HEENT:  No carotid bruits, +2 carotid impulses, no scleral icterus CAR: RRR Irregular RR no murmurs, gallops, rubs, or thrills RES:  Clear to auscultation bilaterally ABD:  Soft, nontender, nondistended, positive bowel sounds x 4 VASC:  +2 radial pulses, +2 carotid pulses NEURO:  CN 2-12 grossly intact; motor and sensory grossly intact PSYCH:  No active depression or anxiety EXT:  No edema, ecchymosis, or cyanosis  Signed, Orbie Pyo, MD  08/10/2023 10:27 AM    [facility] [address] [place], [place], [place]  [address] Phone: [phone]; Fax: [phone]   Note:  This document was prepared using Dragon voice recognition software and may  include unintentional dictation errors.

## 2023-08-11 ENCOUNTER — Other Ambulatory Visit: Payer: Self-pay

## 2023-08-14 ENCOUNTER — Other Ambulatory Visit: Payer: Self-pay

## 2023-08-16 ENCOUNTER — Ambulatory Visit: Payer: 59 | Admitting: Internal Medicine

## 2023-08-16 DIAGNOSIS — J449 Chronic obstructive pulmonary disease, unspecified: Secondary | ICD-10-CM

## 2023-08-16 DIAGNOSIS — I152 Hypertension secondary to endocrine disorders: Secondary | ICD-10-CM

## 2023-08-16 DIAGNOSIS — E118 Type 2 diabetes mellitus with unspecified complications: Secondary | ICD-10-CM

## 2023-08-16 DIAGNOSIS — N183 Chronic kidney disease, stage 3 unspecified: Secondary | ICD-10-CM

## 2023-08-16 DIAGNOSIS — I700 Atherosclerosis of aorta: Secondary | ICD-10-CM

## 2023-08-16 DIAGNOSIS — I2510 Atherosclerotic heart disease of native coronary artery without angina pectoris: Secondary | ICD-10-CM

## 2023-08-16 DIAGNOSIS — Z6841 Body Mass Index (BMI) 40.0 and over, adult: Secondary | ICD-10-CM

## 2023-08-16 DIAGNOSIS — E7841 Elevated Lipoprotein(a): Secondary | ICD-10-CM

## 2023-08-16 NOTE — Progress Notes (Signed)
Cardiology Office Note:   Date:  08/18/2023  ID:  Krystal Allen, DOB 1956-03-01, MRN [id number] PCP:  Marcine Matar, MD  [facility] Providers Cardiologist:  Alverda Skeans, MD Referring MD: Marcine Matar, MD  Chief Complaint/Reason for Referral: Cardiology follow-up ASSESSMENT:    1. Coronary artery disease involving native coronary artery of native heart without angina pectoris   2. Type 2 diabetes mellitus with complication, with long-term current use of insulin (HCC)   3. Hypertension associated with diabetes (HCC)   4. Aortic atherosclerosis (HCC)   5. Elevated Lp(a)   6. Stage 3 chronic kidney disease, unspecified whether stage 3a or 3b CKD (HCC)   7. BMI 40.0-44.9, adult (HCC)   8. Chronic obstructive pulmonary disease, unspecified COPD type (HCC)      PLAN:   In order of problems listed above: Coronary artery disease: Continue aspirin, Repatha, Zetia, and strict blood pressure control. Type 2 diabetes mellitus: Continue aspirin, statin, Repatha, start losartan and Jardiance. Hypertension:  BP is well controlled today.  Given her history as a diabetic she would be best served by being on an ACE or ARB.  Will stop hydralazine and start losartan 25 mg daily.  Check BMP next week Aortic atherosclerosis: Continue aspirin, Repatha, and Zetia.   Elevated Lp(a):  Continue Repatha.  LDL in July was 42 CKD stage III:  Start Jardiance and losartan. Elevated BMI: Continue semaglutide. COPD: Continue current therapy; the patient is followed by other providers.      Dispo:  Return in about 9 months (around 05/17/2024).    Medication Adjustments/Labs and Tests Ordered: Current medicines are reviewed at length with the patient today.  Concerns regarding medicines are outlined above.  The following changes have been made:     Labs/tests ordered: Orders Placed This Encounter  Procedures   Basic Metabolic Panel (BMET)    Medication Changes: Meds ordered this  encounter  Medications   losartan (COZAAR) 25 MG tablet    Sig: Take 1 tablet (25 mg total) by mouth daily.    Dispense:  90 tablet    Refill:  3    Stop hydralazine   empagliflozin (JARDIANCE) 10 MG TABS tablet    Sig: Take 1 tablet (10 mg total) by mouth daily before breakfast.    Dispense:  90 tablet    Refill:  3   empagliflozin (JARDIANCE) 10 MG TABS tablet    Sig: Take 1 tablet (10 mg total) by mouth daily before breakfast.    Dispense:  28 tablet    Refill:  0    Order Specific Question:   Lot Number?    Answer:   [id number]    Order Specific Question:   Expiration Date?    Answer:   08/30/2025    Current medicines are reviewed at length with the patient today.  The patient does not have concerns regarding medicines.  History of Present Illness:      FOCUSED PROBLEM LIST:   Hypertension Hyperlipidemia; highly elevated LP(a) BMI 42 Aortic atherosclerosis and coronary artery calcification chest CT 2023 Type 2 diabetes on insulin CKD stage III COPD and interstitial lung disease on supplemental oxygen Moderate coronary artery disease with FFR negative mid LAD lesion coronary CTA 2023  May 2023 consultation:  The patient is a 67 y.o. female with the indicated medical history here for recommendations regarding shortness of breath.  She was seen in the emergency department recently for chest pain which was tender to palpation.  She  tells me that she has had increasing shortness of breath.  Her albuterol inhaler does not seem to help this.  She does note some wheezing at times.  She is not short of breath at rest.  She has noticed increasing peripheral edema as well as some orthopnea.  She denies any paroxysmal nocturnal dyspnea.  She denies any overt chest pain.  She has had no presyncope, syncope, or palpitations.  She has had no signs or symptoms of stroke.   She does have a long history of smoking and is on chronic supplemental oxygen.  Her last PFTs in our system 10/2017 and her  FEV1 was 2.51 with a r moderately severe fusion defect.  Plan: Obtain coronary CT due to dyspnea; start empiric Lasix; increase atorvastatin to 40 mg.   December 2023: In the interim the patient had a coronary CTA which showed a moderate.  Her atorvastatin was increased to 80 mg for an LDL above goal.  She tells me that starting Lasix has helped her peripheral edema.  She has not noticed that her breathing is much improved.  She denies however any orthopnea or paroxysmal nocturnal dyspnea.  She does get short of breath when he exerts herself more than moderately.  She denies any exertional angina.  She has not required any emergency room visits or hospitalizations.  She is scheduled to see Dr. Rennis Golden for elevated LP(a) level soon.  Plan: Refer for right heart catheterization to assess for pulmonary hypertension.  October 2024:  In the interim the patient had right heart catheterization which showed low filling pressures. She called our office recently with complaints of increasing lower extremity edema. Lower extremity Dopplers were pursued which were negative for DVT. Her Lasix was increased to 20 mg twice daily.  She was seen in our office in March.  Her leg swelling was improved and compression therapy and routine exercise was recommended.  Today she is doing well.  She has occasional lower extremity edema that responds to as needed Lasix.  She denies any other cardiovascular symptoms.  She has lost about 15 pounds on semaglutide.  She has not required any hospitalizations or emergency room visits.  She feels very well and is without significant complaints today.         Current Medications: Current Meds  Medication Sig   Accu-Chek Softclix Lancets lancets Use to check blood sugar three times daily. E11.69   acetaminophen (TYLENOL 8 HOUR) 650 MG CR tablet Take 1 tablet (650 mg total) by mouth 2 (two) times daily as needed for pain.   albuterol (PROAIR HFA) 108 (90 Base) MCG/ACT inhaler Inhale 2 puffs  into the lungs every 4 (four) hours as needed for wheezing or shortness of breath.   albuterol (PROVENTIL) (2.5 MG/3ML) 0.083% nebulizer solution TAKE 3 MLS BY NEBULIZATION EVERY 6 (SIX) HOURS AS NEEDED FOR WHEEZING OR SHORTNESS OF BREATH.   aspirin EC 81 MG tablet Take 1 tablet (81 mg total) by mouth daily.   atorvastatin (LIPITOR) 80 MG tablet Take 1 tablet (80 mg total) by mouth daily.   Blood Glucose Monitoring Suppl (ACCU-CHEK GUIDE) w/Device KIT Use to check blood sugar three times daily. E11.69   cetirizine (ZYRTEC) 10 MG tablet Take 1 tablet (10 mg total) by mouth daily.   Continuous Glucose Receiver (FREESTYLE LIBRE 2 READER) DEVI Use to check blood sugar continuously throughout the day. Change sensors once every 14 days.   Continuous Glucose Sensor (FREESTYLE LIBRE 2 SENSOR) MISC Use to check blood  sugar continuously throughout the day.   diphenhydrAMINE (BENADRYL) 25 mg capsule Take 25-50 mg by mouth every 6 (six) hours as needed for itching or allergies.   Elastic Bandages & Supports (MEDICAL COMPRESSION STOCKINGS) MISC R60.0   empagliflozin (JARDIANCE) 10 MG TABS tablet Take 1 tablet (10 mg total) by mouth daily before breakfast.   empagliflozin (JARDIANCE) 10 MG TABS tablet Take 1 tablet (10 mg total) by mouth daily before breakfast.   EPINEPHrine 0.3 mg/0.3 mL IJ SOAJ injection Inject 0.3 mg into the muscle as needed for anaphylaxis.   Evolocumab (REPATHA SURECLICK) 140 MG/ML SOAJ Inject 140 mg into the skin every 14 (fourteen) days.   ezetimibe (ZETIA) 10 MG tablet Take 1 tablet (10 mg total) by mouth daily.   famotidine (PEPCID) 20 MG tablet Take 1 tablet (20 mg total) by mouth 2 (two) times daily as needed (hives).   fexofenadine (ALLEGRA ALLERGY) 180 MG tablet Take 1 tablet (180 mg total) by mouth 2 (two) times daily as needed (hives).   fluticasone (FLONASE) 50 MCG/ACT nasal spray Place 2 sprays into both nostrils daily.   fluticasone furoate-vilanterol (BREO ELLIPTA) 200-25  MCG/ACT AEPB Inhale 1 puff into the lungs once daily.   furosemide (LASIX) 20 MG tablet Take 1 tablet (20 mg total) by mouth daily as needed (for weight gain of 2-3 lbs overnight or swelling in legs/ankles).   glucose blood (ACCU-CHEK GUIDE) test strip Use to check blood sugar three times daily. E11.69   hydrochlorothiazide (HYDRODIURIL) 25 MG tablet Take 1 tablet (25 mg total) by mouth daily.   hydrOXYzine (ATARAX) 25 MG tablet Take 1 tablet (25 mg total) by mouth every 8 (eight) hours as needed for up to 15 doses for itching.   Insulin Pen Needle 32G X 4 MM MISC Use 4 times daily with insulin pen.   losartan (COZAAR) 25 MG tablet Take 1 tablet (25 mg total) by mouth daily.   Olopatadine HCl 0.2 % SOLN Place 1 drop into both eyes daily as needed (allergies).   omeprazole (PRILOSEC) 20 MG capsule Take 1 capsule (20 mg total) by mouth daily.   oxybutynin (DITROPAN XL) 5 MG 24 hr tablet Take 1 tablet (5 mg total) by mouth at bedtime. (Patient taking differently: Take 5 mg by mouth daily as needed (for urinary incontinence).)   potassium chloride SA (KLOR-CON M) 20 MEQ tablet Take 1 tablet (20 mEq total) by mouth 2 (two) times daily.   Respiratory Therapy Supplies (FLUTTER) DEVI Use as directed   Semaglutide, 1 MG/DOSE, 4 MG/3ML SOPN Inject 1 mg as directed once a week.   sodium chloride (OCEAN) 0.65 % nasal spray Place 2 sprays into both nostrils every 4 (four) hours. (Patient taking differently: Place 2 sprays into the nose every 4 (four) hours as needed for congestion.)   Tiotropium Bromide Monohydrate (SPIRIVA RESPIMAT) 2.5 MCG/ACT AERS Inhale 2 puffs into the lungs daily.   triamcinolone cream (KENALOG) 0.1 % Apply 1 Application topically 2 (two) times daily.   [DISCONTINUED] hydrALAZINE (APRESOLINE) 10 MG tablet Take 1 tablet (10 mg total) by mouth 2 (two) times daily.     Review of Systems:   Please see the history of present illness.    All other systems reviewed and are negative.      EKGs/Labs/Other Test Reviewed:   EKG:    EKG Interpretation Date/Time:    Ventricular Rate:    PR Interval:    QRS Duration:    QT Interval:    QTC  Calculation:   R Axis:      Text Interpretation:           Risk Assessment/Calculations:          Physical Exam:   VS:  BP 114/70   Pulse 84   Ht 5\' 6"  (1.676 m)   Wt 248 lb 9.6 oz (112.8 kg)   SpO2 94%   BMI 40.13 kg/m        Wt Readings from Last 3 Encounters:  08/18/23 248 lb 9.6 oz (112.8 kg)  08/04/23 249 lb (112.9 kg)  06/20/23 256 lb (116.1 kg)      GENERAL:  No apparent distress, AOx3 HEENT:  No carotid bruits, +2 carotid impulses, no scleral icterus CAR: RRR no murmurs, gallops, rubs, or thrills RES:  Clear to auscultation bilaterally ABD:  Soft, nontender, nondistended, positive bowel sounds x 4 VASC:  +2 radial pulses, +2 carotid pulses NEURO:  CN 2-12 grossly intact; motor and sensory grossly intact PSYCH:  No active depression or anxiety EXT:  No edema, ecchymosis, or cyanosis  Signed, Orbie Pyo, MD  08/18/2023 3:53 PM    [facility] [address] [place], [place], [place]  [address] Phone: [phone]; Fax: [phone]   Note:  This document was prepared using Dragon voice recognition software and may include unintentional dictation errors.

## 2023-08-18 ENCOUNTER — Ambulatory Visit: Payer: 59 | Attending: Internal Medicine | Admitting: Internal Medicine

## 2023-08-18 ENCOUNTER — Encounter: Payer: Self-pay | Admitting: Internal Medicine

## 2023-08-18 ENCOUNTER — Other Ambulatory Visit: Payer: Self-pay | Admitting: *Deleted

## 2023-08-18 ENCOUNTER — Other Ambulatory Visit (HOSPITAL_COMMUNITY): Payer: Self-pay

## 2023-08-18 VITALS — BP 114/70 | HR 84 | Ht 66.0 in | Wt 248.6 lb

## 2023-08-18 DIAGNOSIS — I700 Atherosclerosis of aorta: Secondary | ICD-10-CM | POA: Diagnosis not present

## 2023-08-18 DIAGNOSIS — E7841 Elevated Lipoprotein(a): Secondary | ICD-10-CM

## 2023-08-18 DIAGNOSIS — Z794 Long term (current) use of insulin: Secondary | ICD-10-CM

## 2023-08-18 DIAGNOSIS — I2510 Atherosclerotic heart disease of native coronary artery without angina pectoris: Secondary | ICD-10-CM

## 2023-08-18 DIAGNOSIS — I152 Hypertension secondary to endocrine disorders: Secondary | ICD-10-CM

## 2023-08-18 DIAGNOSIS — J449 Chronic obstructive pulmonary disease, unspecified: Secondary | ICD-10-CM | POA: Diagnosis not present

## 2023-08-18 DIAGNOSIS — E118 Type 2 diabetes mellitus with unspecified complications: Secondary | ICD-10-CM

## 2023-08-18 DIAGNOSIS — E1159 Type 2 diabetes mellitus with other circulatory complications: Secondary | ICD-10-CM

## 2023-08-18 DIAGNOSIS — Z6841 Body Mass Index (BMI) 40.0 and over, adult: Secondary | ICD-10-CM

## 2023-08-18 DIAGNOSIS — N183 Chronic kidney disease, stage 3 unspecified: Secondary | ICD-10-CM

## 2023-08-18 MED ORDER — EMPAGLIFLOZIN 10 MG PO TABS
10.0000 mg | ORAL_TABLET | Freq: Every day | ORAL | 3 refills | Status: DC
  Filled 2023-08-18: qty 90, 90d supply, fill #0
  Filled 2023-11-18: qty 90, 90d supply, fill #1
  Filled 2024-02-26: qty 90, 90d supply, fill #2
  Filled 2024-06-13: qty 90, 90d supply, fill #3

## 2023-08-18 MED ORDER — LOSARTAN POTASSIUM 25 MG PO TABS
25.0000 mg | ORAL_TABLET | Freq: Every day | ORAL | 3 refills | Status: DC
  Filled 2023-08-18: qty 90, 90d supply, fill #0
  Filled 2023-11-18: qty 90, 90d supply, fill #1
  Filled 2024-02-26: qty 90, 90d supply, fill #2
  Filled 2024-06-13: qty 90, 90d supply, fill #3

## 2023-08-18 MED ORDER — EMPAGLIFLOZIN 10 MG PO TABS: 10.0000 mg | ORAL_TABLET | Freq: Every day | ORAL | 0 refills | Status: DC

## 2023-08-18 NOTE — Patient Instructions (Signed)
Medication Instructions:  Your physician has recommended you make the following change in your medication:  1.) stop hydralazine 2.) start losartan 25 mg daily 3.) start Jardiance 10 mg daily  *If you need a refill on your cardiac medications before your next appointment, please call your pharmacy*   Lab Work: Please return about 7-10 days after medicine change for blood work (bmet)  If you have labs (blood work) drawn today and your tests are completely normal, you will receive your results only by: [employer] (if you have MyChart) OR A paper copy in the mail If you have any lab test that is abnormal or we need to change your treatment, we will call you to review the results.   Testing/Procedures: none   Follow-Up: At [facility], you and your health needs are our priority.  As part of our continuing mission to provide you with exceptional heart care, we have created designated Provider Care Teams.  These Care Teams include your primary Cardiologist (physician) and Advanced Practice Providers (APPs -  Physician Assistants and Nurse Practitioners) who all work together to provide you with the care you need, when you need it.  We recommend signing up for the patient portal called "MyChart".  Sign up information is provided on this After Visit Summary.  MyChart is used to connect with patients for Virtual Visits (Telemedicine).  Patients are able to view lab/test results, encounter notes, upcoming appointments, etc.  Non-urgent messages can be sent to your provider as well.   To learn more about what you can do with MyChart, go to [email or web address].    Your next appointment:   9 month(s)  Provider:   Orbie Pyo, MD

## 2023-08-19 ENCOUNTER — Other Ambulatory Visit (HOSPITAL_COMMUNITY): Payer: Self-pay

## 2023-08-22 ENCOUNTER — Encounter (HOSPITAL_BASED_OUTPATIENT_CLINIC_OR_DEPARTMENT_OTHER): Payer: Self-pay

## 2023-08-24 DIAGNOSIS — R0602 Shortness of breath: Secondary | ICD-10-CM | POA: Diagnosis not present

## 2023-09-05 ENCOUNTER — Encounter (HOSPITAL_BASED_OUTPATIENT_CLINIC_OR_DEPARTMENT_OTHER): Payer: 59

## 2023-09-11 ENCOUNTER — Other Ambulatory Visit (HOSPITAL_COMMUNITY): Payer: Self-pay

## 2023-09-11 DIAGNOSIS — I2510 Atherosclerotic heart disease of native coronary artery without angina pectoris: Secondary | ICD-10-CM | POA: Diagnosis not present

## 2023-09-11 DIAGNOSIS — E118 Type 2 diabetes mellitus with unspecified complications: Secondary | ICD-10-CM | POA: Diagnosis not present

## 2023-09-11 DIAGNOSIS — E1159 Type 2 diabetes mellitus with other circulatory complications: Secondary | ICD-10-CM | POA: Diagnosis not present

## 2023-09-11 DIAGNOSIS — I152 Hypertension secondary to endocrine disorders: Secondary | ICD-10-CM | POA: Diagnosis not present

## 2023-09-11 DIAGNOSIS — Z794 Long term (current) use of insulin: Secondary | ICD-10-CM | POA: Diagnosis not present

## 2023-09-11 LAB — BASIC METABOLIC PANEL
BUN/Creatinine Ratio: 12 (ref 12–28)
BUN: 18 mg/dL (ref 8–27)
CO2: 28 mmol/L (ref 20–29)
Calcium: 9.3 mg/dL (ref 8.7–10.3)
Chloride: 100 mmol/L (ref 96–106)
Creatinine, Ser: 1.48 mg/dL — ABNORMAL HIGH (ref 0.57–1.00)
Glucose: 80 mg/dL (ref 70–99)
Potassium: 3.6 mmol/L (ref 3.5–5.2)
Sodium: 141 mmol/L (ref 134–144)
eGFR: 39 mL/min/{1.73_m2} — ABNORMAL LOW (ref >59)

## 2023-09-13 ENCOUNTER — Encounter: Payer: Self-pay | Admitting: Internal Medicine

## 2023-09-16 ENCOUNTER — Other Ambulatory Visit (HOSPITAL_COMMUNITY): Payer: Self-pay

## 2023-09-16 ENCOUNTER — Other Ambulatory Visit: Payer: Self-pay | Admitting: Internal Medicine

## 2023-09-16 NOTE — Progress Notes (Signed)
This encounter was created in error - please disregard.

## 2023-09-18 ENCOUNTER — Other Ambulatory Visit: Payer: Self-pay

## 2023-09-18 ENCOUNTER — Other Ambulatory Visit (HOSPITAL_COMMUNITY): Payer: Self-pay

## 2023-09-18 ENCOUNTER — Ambulatory Visit (HOSPITAL_BASED_OUTPATIENT_CLINIC_OR_DEPARTMENT_OTHER): Payer: 59 | Admitting: Pulmonary Disease

## 2023-09-18 DIAGNOSIS — J8482 Adult pulmonary Langerhans cell histiocytosis: Secondary | ICD-10-CM | POA: Diagnosis not present

## 2023-09-18 LAB — PULMONARY FUNCTION TEST
DL/VA % pred: 90 %
DL/VA: 3.64 ml/min/mmHg/L
DLCO cor % pred: 77 %
DLCO cor: 17.41 ml/min/mmHg
DLCO unc % pred: 77 %
DLCO unc: 17.41 ml/min/mmHg
FEF 25-75 Post: 2.05 L/s
FEF 25-75 Pre: 1.99 L/s
FEF2575-%Change-Post: 2 %
FEF2575-%Pred-Post: 89 %
FEF2575-%Pred-Pre: 87 %
FEV1-%Change-Post: 0 %
FEV1-%Pred-Post: 81 %
FEV1-%Pred-Pre: 81 %
FEV1-Post: 2.25 L
FEV1-Pre: 2.25 L
FEV1FVC-%Change-Post: 0 %
FEV1FVC-%Pred-Pre: 101 %
FEV6-%Change-Post: 0 %
FEV6-%Pred-Post: 83 %
FEV6-%Pred-Pre: 83 %
FEV6-Post: 2.88 L
FEV6-Pre: 2.88 L
FEV6FVC-%Pred-Post: 104 %
FEV6FVC-%Pred-Pre: 104 %
FVC-%Change-Post: 0 %
FVC-%Pred-Post: 79 %
FVC-%Pred-Pre: 79 %
FVC-Post: 2.88 L
FVC-Pre: 2.88 L
Post FEV1/FVC ratio: 78 %
Post FEV6/FVC ratio: 100 %
Pre FEV1/FVC ratio: 78 %
Pre FEV6/FVC Ratio: 100 %
RV % pred: 110 %
RV: 2.57 L
TLC % pred: 100 %
TLC: 5.66 L

## 2023-09-18 MED ORDER — REPATHA SURECLICK 140 MG/ML ~~LOC~~ SOAJ
2.0000 mL | SUBCUTANEOUS | 3 refills | Status: DC
  Filled 2023-09-18: qty 6, 84d supply, fill #0
  Filled 2023-12-11: qty 6, 84d supply, fill #1
  Filled 2024-03-19: qty 6, 84d supply, fill #2
  Filled 2024-07-06: qty 6, 84d supply, fill #3

## 2023-09-18 NOTE — Progress Notes (Signed)
Full PFT Performed Today  

## 2023-09-18 NOTE — Patient Instructions (Signed)
Full PFT Performed Today  

## 2023-09-24 DIAGNOSIS — R0602 Shortness of breath: Secondary | ICD-10-CM | POA: Diagnosis not present

## 2023-10-05 ENCOUNTER — Other Ambulatory Visit: Payer: Self-pay

## 2023-10-05 ENCOUNTER — Telehealth (HOSPITAL_BASED_OUTPATIENT_CLINIC_OR_DEPARTMENT_OTHER): Payer: Self-pay | Admitting: Pulmonary Disease

## 2023-10-05 DIAGNOSIS — J432 Centrilobular emphysema: Secondary | ICD-10-CM

## 2023-10-05 MED ORDER — SPIRIVA RESPIMAT 2.5 MCG/ACT IN AERS
2.0000 | INHALATION_SPRAY | Freq: Every day | RESPIRATORY_TRACT | 11 refills | Status: DC
  Filled 2023-10-05: qty 4, 30d supply, fill #0
  Filled 2023-11-11: qty 4, 30d supply, fill #1
  Filled 2023-12-11: qty 4, 30d supply, fill #2
  Filled 2024-02-26: qty 4, 30d supply, fill #3
  Filled 2024-05-16: qty 4, 30d supply, fill #4
  Filled 2024-08-03: qty 4, 30d supply, fill #5
  Filled 2024-08-31: qty 4, 30d supply, fill #6
  Filled 2024-09-30: qty 4, 30d supply, fill #7

## 2023-10-05 MED ORDER — FLUTICASONE FUROATE-VILANTEROL 200-25 MCG/ACT IN AEPB
1.0000 | INHALATION_SPRAY | Freq: Every day | RESPIRATORY_TRACT | 11 refills | Status: DC
  Filled 2023-10-05: qty 60, 30d supply, fill #0
  Filled 2023-11-11: qty 60, 30d supply, fill #1
  Filled 2023-12-11: qty 60, 30d supply, fill #2
  Filled 2024-02-26: qty 60, 30d supply, fill #3
  Filled 2024-05-16: qty 60, 30d supply, fill #4
  Filled 2024-08-03: qty 60, 30d supply, fill #5
  Filled 2024-08-31: qty 60, 30d supply, fill #6
  Filled 2024-09-30: qty 60, 30d supply, fill #7

## 2023-10-05 NOTE — Telephone Encounter (Signed)
Patient will need appointment but is there an alternative route outside of [employer]?

## 2023-10-05 NOTE — Telephone Encounter (Signed)
[facility] Telephone Encounter  Called patient regarding PFT results. Improving DLCO.  09/18/23 FVC 2.88 (79%) FEV1 2.25 (81%) Ratio 78  TLC 100% DLCO 77% Interpretation: No obstructive or restrictive defect. Improved DLCO to 77%  There is a possibility she may not need oxygen anymore.  However patient states she has not had ONO completed due to $300 bill that [employer] did not take out on monthly payments.   [facility] - please see if she can obtain ONO testing on room air through an alternative route. Or is her only option to go through [employer]?

## 2023-10-05 NOTE — Telephone Encounter (Signed)
Patient hasn't been seen since 04/2023 and may need new office visit and new ONO order placed

## 2023-10-13 NOTE — Telephone Encounter (Signed)
Discussed with [facility] group member:  Since the patient has O2 with [employer], she will need to stick with [employer] for the ONO - that is something difficult to get with another DME if the pt gets O2 from another DME provider.  In addition, pt's insurance requires her to use [employer] for her DME needs.    Will have to hold on ONO until her financial status with [employer] is reconciled. After that can re-order at that time.  Will plan for follow-up visit in Feb/March 2025

## 2023-10-14 ENCOUNTER — Other Ambulatory Visit: Payer: Self-pay | Admitting: Internal Medicine

## 2023-10-14 ENCOUNTER — Other Ambulatory Visit (HOSPITAL_COMMUNITY): Payer: Self-pay

## 2023-10-16 ENCOUNTER — Other Ambulatory Visit: Payer: Self-pay

## 2023-10-16 ENCOUNTER — Other Ambulatory Visit (HOSPITAL_COMMUNITY): Payer: Self-pay

## 2023-10-16 MED ORDER — EZETIMIBE 10 MG PO TABS
10.0000 mg | ORAL_TABLET | Freq: Every day | ORAL | 2 refills | Status: DC
  Filled 2023-10-16: qty 90, 90d supply, fill #0
  Filled 2024-02-26: qty 90, 90d supply, fill #1
  Filled 2024-06-13: qty 90, 90d supply, fill #2

## 2023-10-16 NOTE — Telephone Encounter (Signed)
Patient has been scheduled for follow up with Dr. Everardo All

## 2023-10-16 NOTE — Telephone Encounter (Signed)
Patient aware of [facility] discussion. Looks forward to next follow-up visit. Closing encounter.

## 2023-10-17 ENCOUNTER — Telehealth: Payer: Self-pay | Admitting: Internal Medicine

## 2023-10-17 DIAGNOSIS — Z13820 Encounter for screening for osteoporosis: Secondary | ICD-10-CM

## 2023-10-17 DIAGNOSIS — Z1231 Encounter for screening mammogram for malignant neoplasm of breast: Secondary | ICD-10-CM

## 2023-10-17 DIAGNOSIS — N644 Mastodynia: Secondary | ICD-10-CM

## 2023-10-17 NOTE — Telephone Encounter (Signed)
Referral Request - Did the patient discuss referral with their provider in the last year? yes  Appointment offered? yes  Type of order/referral and detailed reason for visit: mammogram/bone density   Preference of office, provider, location: ?  If referral order, have you been seen by this specialty before? no (If Yes, this issue or another issue? When? Where?  Can we respond through MyChart? yes

## 2023-10-17 NOTE — Telephone Encounter (Signed)
Referral

## 2023-10-18 NOTE — Telephone Encounter (Signed)
Please clarify your message and what I am being asked to do. What is [facility]? Pt had MMG 01/2022 and had nipple discharge at the time but nothing since

## 2023-10-20 ENCOUNTER — Other Ambulatory Visit: Payer: Self-pay | Admitting: Internal Medicine

## 2023-10-20 DIAGNOSIS — I10 Essential (primary) hypertension: Secondary | ICD-10-CM

## 2023-10-23 ENCOUNTER — Other Ambulatory Visit (HOSPITAL_COMMUNITY): Payer: Self-pay

## 2023-10-23 ENCOUNTER — Other Ambulatory Visit: Payer: Self-pay | Admitting: Internal Medicine

## 2023-10-23 ENCOUNTER — Other Ambulatory Visit (HOSPITAL_BASED_OUTPATIENT_CLINIC_OR_DEPARTMENT_OTHER): Payer: Self-pay

## 2023-10-23 DIAGNOSIS — N644 Mastodynia: Secondary | ICD-10-CM

## 2023-10-23 MED ORDER — HYDROCHLOROTHIAZIDE 25 MG PO TABS
25.0000 mg | ORAL_TABLET | Freq: Every day | ORAL | 1 refills | Status: DC
  Filled 2023-10-23: qty 90, 90d supply, fill #0
  Filled 2024-02-26: qty 90, 90d supply, fill #1

## 2023-10-23 MED ORDER — FAMOTIDINE 20 MG PO TABS
20.0000 mg | ORAL_TABLET | Freq: Two times a day (BID) | ORAL | 0 refills | Status: DC | PRN
  Filled 2023-10-23: qty 60, 30d supply, fill #0

## 2023-10-23 NOTE — Telephone Encounter (Signed)
Please call the breast center and let them know that I have resent orders for diagnostic mammogram on this patient.  I spoke with the patient today, she no longer has discharge from the nipple that she had back in the spring 2023.  However she is reports some RT nipple soreness last week.  I have ordered diagnostic mammogram for the right side.  Does not need an MRI.

## 2023-10-23 NOTE — Telephone Encounter (Signed)
Called [facility] and spoke to Blanche. Informed Wyvonna Plum that a MRI is no longer needed due to no discharge from the right nipple since spring of 2023. Informed Wyvonna Plum that orders for a diagnostic mammogram have been ordered. Wyvonna Plum stated that patient has already been scheduled for an appointment for 11/14/2022. No further assistance required at this time.

## 2023-10-23 NOTE — Addendum Note (Signed)
Addended by: Jonah Blue B on: 10/23/2023 01:49 PM   Modules accepted: Orders

## 2023-10-24 DIAGNOSIS — R0602 Shortness of breath: Secondary | ICD-10-CM | POA: Diagnosis not present

## 2023-10-29 ENCOUNTER — Other Ambulatory Visit (HOSPITAL_COMMUNITY): Payer: Self-pay

## 2023-11-11 ENCOUNTER — Other Ambulatory Visit (HOSPITAL_COMMUNITY): Payer: Self-pay

## 2023-11-15 ENCOUNTER — Encounter: Payer: Self-pay | Admitting: Internal Medicine

## 2023-11-15 ENCOUNTER — Other Ambulatory Visit: Payer: Self-pay

## 2023-11-15 ENCOUNTER — Other Ambulatory Visit (HOSPITAL_COMMUNITY): Payer: Self-pay

## 2023-11-15 ENCOUNTER — Telehealth: Payer: Self-pay | Admitting: Internal Medicine

## 2023-11-15 ENCOUNTER — Ambulatory Visit
Admission: RE | Admit: 2023-11-15 | Discharge: 2023-11-15 | Disposition: A | Discharge status: Home / Self Care | Payer: 59 | Source: Ambulatory Visit | Attending: Internal Medicine | Admitting: Internal Medicine

## 2023-11-15 ENCOUNTER — Other Ambulatory Visit: Payer: Self-pay | Admitting: Internal Medicine

## 2023-11-15 ENCOUNTER — Ambulatory Visit: Payer: 59 | Admitting: Internal Medicine

## 2023-11-15 VITALS — BP 100/60 | HR 84 | Ht 66.5 in | Wt 235.2 lb

## 2023-11-15 DIAGNOSIS — Z860101 Personal history of adenomatous and serrated colon polyps: Secondary | ICD-10-CM | POA: Diagnosis not present

## 2023-11-15 DIAGNOSIS — Z9981 Dependence on supplemental oxygen: Secondary | ICD-10-CM | POA: Diagnosis not present

## 2023-11-15 DIAGNOSIS — C966 Unifocal Langerhans-cell histiocytosis: Secondary | ICD-10-CM

## 2023-11-15 DIAGNOSIS — N644 Mastodynia: Secondary | ICD-10-CM

## 2023-11-15 DIAGNOSIS — Z13820 Encounter for screening for osteoporosis: Secondary | ICD-10-CM

## 2023-11-15 DIAGNOSIS — N6459 Other signs and symptoms in breast: Secondary | ICD-10-CM | POA: Diagnosis not present

## 2023-11-15 DIAGNOSIS — K648 Other hemorrhoids: Secondary | ICD-10-CM | POA: Diagnosis not present

## 2023-11-15 DIAGNOSIS — Z1231 Encounter for screening mammogram for malignant neoplasm of breast: Secondary | ICD-10-CM

## 2023-11-15 MED ORDER — NA SULFATE-K SULFATE-MG SULF 17.5-3.13-1.6 GM/177ML PO SOLN
1.0000 | Freq: Once | ORAL | 0 refills | Status: AC
  Filled 2023-11-15: qty 354, 1d supply, fill #0

## 2023-11-15 NOTE — Telephone Encounter (Signed)
 Patient was sent the above message via MyChart: I received the results of your mammogram.  The radiologist did not see any findings in the breast to suggest breast cancer.  Superficial cut was seen on the right nipple.  If this does not heal, the radiologist recommends getting a biopsy of the area.  I will take a look at it when I see you next month.

## 2023-11-15 NOTE — Patient Instructions (Signed)
You have been scheduled for a colonoscopy. Please follow written instructions given to you at your visit today.   Please pick up your prep supplies at the pharmacy within the next 1-3 days.  If you use inhalers (even only as needed), please bring them with you on the day of your procedure.  DO NOT TAKE 7 DAYS PRIOR TO TEST- Trulicity (dulaglutide) Ozempic, Wegovy (semaglutide) Mounjaro (tirzepatide) Bydureon Bcise (exanatide extended release)  DO NOT TAKE 1 DAY PRIOR TO YOUR TEST Rybelsus (semaglutide) Adlyxin (lixisenatide) Victoza (liraglutide) Byetta (exanatide) ___________________________________________________________________________  I appreciate the opportunity to care for you. Carl Gessner, MD, FACG 

## 2023-11-15 NOTE — H&P (View-Only) (Signed)
Krystal Allen 68 y.o. [birth year] [id number]  Assessment & Plan:   Encounter Diagnoses  Name Primary?   Hx of adenomatous colonic polyps Yes   Langerhan's cell histiocytosis (HCC)    On supplemental oxygen therapy    Prolapsed internal hemorrhoids      Advised to use O2 Tx at night as recommended Schedule colonoscopy at hospital due to O2 Tx/co-morbidities which increase risk of sedation  The risks and benefits as well as alternatives of endoscopic procedure(s) have been discussed and reviewed. All questions answered. The patient agrees to proceed.  Reassess hemorrhoids then  She is on semaglutide.  We will notify her to not take this 1 week prior to her procedure.   Subjective:  Discussed the use of AI scribe software for clinical note transcription with the patient, who gave verbal consent to proceed.   Chief Complaint: History of colon polyps  HPI 68 year old African-American woman with a history of adenomatous colon polyps and hemorrhoids, is due for a repeat colonoscopy.  Last colonoscopy performed by Dr. Chales Abrahams in my absence September 2021 with 8 adenomas and hemorrhoids.  She reports persistent hemorrhoids that occasionally bleed, particularly during episodes of constipation. The severity of the hemorrhoids varies, and she has previously undergone banding treatment and a prior surgery. She expresses a strong aversion to further surgical intervention due to a past negative experience.  The patient also has a history of lung issues and has been prescribed oxygen therapy, which she admits to not using regularly. She only uses the oxygen when she has a severe cold and struggles with breathing. She also mentions she was advised to use the oxygen while sleeping, but she does not follow this advice as she does not understand the purpose. She has an upcoming appointment with her pulmonologist.  The patient's overall health status appears to be stable, with no new symptoms or  significant changes reported.      She last saw cardiology in October 2024 and her cardiac problems were stable and a 76-month follow-up was recommended.  Note reviewed.   Hx colon polyps 05/2013 - 2 adenomas 2021 8 adenomas  Allergies  Allergen Reactions   Sulfa Antibiotics Anaphylaxis and Hives   Chantix [Varenicline Tartrate]     Bad dreams   Fish Allergy Other (See Comments)    Severe stomach cramps    Hygroton [Chlorthalidone]     Swelling or hives   Penicillins Other (See Comments)    Pt took Amoxicillin in 2019 without issue, reports PCN caused rash in the past   Latex Rash and Other (See Comments)    Dry skin   Current Meds  Medication Sig   Accu-Chek Softclix Lancets lancets Use to check blood sugar three times daily. E11.69   acetaminophen (TYLENOL 8 HOUR) 650 MG CR tablet Take 1 tablet (650 mg total) by mouth 2 (two) times daily as needed for pain.   albuterol (PROAIR HFA) 108 (90 Base) MCG/ACT inhaler Inhale 2 puffs into the lungs every 4 (four) hours as needed for wheezing or shortness of breath.   albuterol (PROVENTIL) (2.5 MG/3ML) 0.083% nebulizer solution TAKE 3 MLS BY NEBULIZATION EVERY 6 (SIX) HOURS AS NEEDED FOR WHEEZING OR SHORTNESS OF BREATH.   aspirin EC 81 MG tablet Take 1 tablet (81 mg total) by mouth daily.   atorvastatin (LIPITOR) 80 MG tablet Take 1 tablet (80 mg total) by mouth daily.   Blood Glucose Monitoring Suppl (ACCU-CHEK GUIDE) w/Device KIT Use to check blood sugar three  times daily. E11.69   cetirizine (ZYRTEC) 10 MG tablet Take 1 tablet (10 mg total) by mouth daily.   Continuous Glucose Receiver (FREESTYLE LIBRE 2 READER) DEVI Use to check blood sugar continuously throughout the day. Change sensors once every 14 days.   Continuous Glucose Sensor (FREESTYLE LIBRE 2 SENSOR) MISC Use to check blood sugar continuously throughout the day.   diphenhydrAMINE (BENADRYL) 25 mg capsule Take 25-50 mg by mouth every 6 (six) hours as needed for itching or  allergies.   Elastic Bandages & Supports (MEDICAL COMPRESSION STOCKINGS) MISC R60.0   empagliflozin (JARDIANCE) 10 MG TABS tablet Take 1 tablet (10 mg total) by mouth daily before breakfast.   Evolocumab (REPATHA SURECLICK) 140 MG/ML SOAJ Inject 280 mg into the skin every 14 (fourteen) days.   ezetimibe (ZETIA) 10 MG tablet Take 1 tablet (10 mg total) by mouth daily.   famotidine (PEPCID) 20 MG tablet Take 1 tablet (20 mg total) by mouth 2 (two) times daily as needed (hives).   fexofenadine (ALLEGRA ALLERGY) 180 MG tablet Take 1 tablet (180 mg total) by mouth 2 (two) times daily as needed (hives).   fluticasone (FLONASE) 50 MCG/ACT nasal spray Place 2 sprays into both nostrils daily.   fluticasone furoate-vilanterol (BREO ELLIPTA) 200-25 MCG/ACT AEPB Inhale 1 puff into the lungs once daily.   furosemide (LASIX) 20 MG tablet Take 1 tablet (20 mg total) by mouth daily as needed (for weight gain of 2-3 lbs overnight or swelling in legs/ankles).   glucose blood (ACCU-CHEK GUIDE) test strip Use to check blood sugar three times daily. E11.69   hydrochlorothiazide (HYDRODIURIL) 25 MG tablet Take 1 tablet (25 mg total) by mouth daily.   hydrOXYzine (ATARAX) 25 MG tablet Take 1 tablet (25 mg total) by mouth every 8 (eight) hours as needed for up to 15 doses for itching.   Insulin Pen Needle 32G X 4 MM MISC Use 4 times daily with insulin pen.   losartan (COZAAR) 25 MG tablet Take 1 tablet (25 mg total) by mouth daily.   Olopatadine HCl 0.2 % SOLN Place 1 drop into both eyes daily as needed (allergies).   omeprazole (PRILOSEC) 20 MG capsule Take 1 capsule (20 mg total) by mouth daily.   oxybutynin (DITROPAN XL) 5 MG 24 hr tablet Take 1 tablet (5 mg total) by mouth at bedtime. (Patient taking differently: Take 5 mg by mouth daily as needed (for urinary incontinence).)   potassium chloride SA (KLOR-CON M) 20 MEQ tablet Take 1 tablet (20 mEq total) by mouth 2 (two) times daily.   Respiratory Therapy Supplies  (FLUTTER) DEVI Use as directed   Semaglutide, 1 MG/DOSE, 4 MG/3ML SOPN Inject 1 mg as directed once a week.   sodium chloride (OCEAN) 0.65 % nasal spray Place 2 sprays into both nostrils every 4 (four) hours. (Patient taking differently: Place 2 sprays into the nose every 4 (four) hours as needed for congestion.)   Tiotropium Bromide Monohydrate (SPIRIVA RESPIMAT) 2.5 MCG/ACT AERS Inhale 2 puffs into the lungs daily.   Past Medical History:  Diagnosis Date   Allergy    COPD (chronic obstructive pulmonary disease) (HCC)    Depression    GERD (gastroesophageal reflux disease)    Hernia, abdominal    History of chicken pox    Hyperlipidemia    Hypertension    Internal hemorrhoids with Grade 3 prolapse and bleeding 06/19/2013   Osteoarthritis    Personal history of colonic adenomas 06/25/2013   Past Surgical History:  Procedure Laterality Date   ABDOMINAL HYSTERECTOMY  63yrs ago   due to heavy bleeding and fibroids    COLONOSCOPY     HEMORRHOID BANDING  2014   HEMORRHOID SURGERY  31yrs agao   RIGHT HEART CATH N/A 10/12/2022   Procedure: RIGHT HEART CATH;  Surgeon: Orbie Pyo, MD;  Location: [facility];  Service: Cardiovascular;  Laterality: N/A;   UMBILICAL HERNIA REPAIR  06/2015   Social History   Social History Narrative   Regular exercise-no Caffeine Use-   family history includes Cancer in her father; Diabetes in her mother; Heart disease in her maternal uncle; Hypertension in her maternal grandmother, mother, sister, and sister; Stroke in her maternal grandmother.   Review of Systems As per HPI Otherwise negative Objective:   Physical Exam @BP  100/60 (BP Location: Left Arm, Patient Position: Sitting, Cuff Size: Large)   Pulse 84   Ht 5' 6.5" (1.689 m)   Wt 235 lb 4 oz (106.7 kg)   BMI 37.40 kg/m @  General:  NAD Eyes:   anicteric Lungs:  clear Heart::  S1S2 no rubs, murmurs or gallops Abdomen:  soft and nontender, BS+     Data Reviewed:  See  HPI

## 2023-11-15 NOTE — Progress Notes (Signed)
 Krystal Allen 68 y.o. [birth year] [id number]  Assessment & Plan:   Encounter Diagnoses  Name Primary?   Hx of adenomatous colonic polyps Yes   Langerhan's cell histiocytosis (HCC)    On supplemental oxygen therapy    Prolapsed internal hemorrhoids      Advised to use O2 Tx at night as recommended Schedule colonoscopy at hospital due to O2 Tx/co-morbidities which increase risk of sedation  The risks and benefits as well as alternatives of endoscopic procedure(s) have been discussed and reviewed. All questions answered. The patient agrees to proceed.  Reassess hemorrhoids then  She is on semaglutide.  We will notify her to not take this 1 week prior to her procedure.   Subjective:  Discussed the use of AI scribe software for clinical note transcription with the patient, who gave verbal consent to proceed.   Chief Complaint: History of colon polyps  HPI 68 year old African-American woman with a history of adenomatous colon polyps and hemorrhoids, is due for a repeat colonoscopy.  Last colonoscopy performed by Dr. Chales Allen in my absence September 2021 with 8 adenomas and hemorrhoids.  She reports persistent hemorrhoids that occasionally bleed, particularly during episodes of constipation. The severity of the hemorrhoids varies, and she has previously undergone banding treatment and a prior surgery. She expresses a strong aversion to further surgical intervention due to a past negative experience.  The patient also has a history of lung issues and has been prescribed oxygen therapy, which she admits to not using regularly. She only uses the oxygen when she has a severe cold and struggles with breathing. She also mentions she was advised to use the oxygen while sleeping, but she does not follow this advice as she does not understand the purpose. She has an upcoming appointment with her pulmonologist.  The patient's overall health status appears to be stable, with no new symptoms or  significant changes reported.      She last saw cardiology in October 2024 and her cardiac problems were stable and a 76-month follow-up was recommended.  Note reviewed.   Hx colon polyps 05/2013 - 2 adenomas 2021 8 adenomas  Allergies  Allergen Reactions   Sulfa Antibiotics Anaphylaxis and Hives   Chantix [Varenicline Tartrate]     Bad dreams   Fish Allergy Other (See Comments)    Severe stomach cramps    Hygroton [Chlorthalidone]     Swelling or hives   Penicillins Other (See Comments)    Pt took Amoxicillin in 2019 without issue, reports PCN caused rash in the past   Latex Rash and Other (See Comments)    Dry skin   Current Meds  Medication Sig   Accu-Chek Softclix Lancets lancets Use to check blood sugar three times daily. E11.69   acetaminophen (TYLENOL 8 HOUR) 650 MG CR tablet Take 1 tablet (650 mg total) by mouth 2 (two) times daily as needed for pain.   albuterol (PROAIR HFA) 108 (90 Base) MCG/ACT inhaler Inhale 2 puffs into the lungs every 4 (four) hours as needed for wheezing or shortness of breath.   albuterol (PROVENTIL) (2.5 MG/3ML) 0.083% nebulizer solution TAKE 3 MLS BY NEBULIZATION EVERY 6 (SIX) HOURS AS NEEDED FOR WHEEZING OR SHORTNESS OF BREATH.   aspirin EC 81 MG tablet Take 1 tablet (81 mg total) by mouth daily.   atorvastatin (LIPITOR) 80 MG tablet Take 1 tablet (80 mg total) by mouth daily.   Blood Glucose Monitoring Suppl (ACCU-CHEK GUIDE) w/Device KIT Use to check blood sugar three  times daily. E11.69   cetirizine (ZYRTEC) 10 MG tablet Take 1 tablet (10 mg total) by mouth daily.   Continuous Glucose Receiver (FREESTYLE LIBRE 2 READER) DEVI Use to check blood sugar continuously throughout the day. Change sensors once every 14 days.   Continuous Glucose Sensor (FREESTYLE LIBRE 2 SENSOR) MISC Use to check blood sugar continuously throughout the day.   diphenhydrAMINE (BENADRYL) 25 mg capsule Take 25-50 mg by mouth every 6 (six) hours as needed for itching or  allergies.   Elastic Bandages & Supports (MEDICAL COMPRESSION STOCKINGS) MISC R60.0   empagliflozin (JARDIANCE) 10 MG TABS tablet Take 1 tablet (10 mg total) by mouth daily before breakfast.   Evolocumab (REPATHA SURECLICK) 140 MG/ML SOAJ Inject 280 mg into the skin every 14 (fourteen) days.   ezetimibe (ZETIA) 10 MG tablet Take 1 tablet (10 mg total) by mouth daily.   famotidine (PEPCID) 20 MG tablet Take 1 tablet (20 mg total) by mouth 2 (two) times daily as needed (hives).   fexofenadine (ALLEGRA ALLERGY) 180 MG tablet Take 1 tablet (180 mg total) by mouth 2 (two) times daily as needed (hives).   fluticasone (FLONASE) 50 MCG/ACT nasal spray Place 2 sprays into both nostrils daily.   fluticasone furoate-vilanterol (BREO ELLIPTA) 200-25 MCG/ACT AEPB Inhale 1 puff into the lungs once daily.   furosemide (LASIX) 20 MG tablet Take 1 tablet (20 mg total) by mouth daily as needed (for weight gain of 2-3 lbs overnight or swelling in legs/ankles).   glucose blood (ACCU-CHEK GUIDE) test strip Use to check blood sugar three times daily. E11.69   hydrochlorothiazide (HYDRODIURIL) 25 MG tablet Take 1 tablet (25 mg total) by mouth daily.   hydrOXYzine (ATARAX) 25 MG tablet Take 1 tablet (25 mg total) by mouth every 8 (eight) hours as needed for up to 15 doses for itching.   Insulin Pen Needle 32G X 4 MM MISC Use 4 times daily with insulin pen.   losartan (COZAAR) 25 MG tablet Take 1 tablet (25 mg total) by mouth daily.   Olopatadine HCl 0.2 % SOLN Place 1 drop into both eyes daily as needed (allergies).   omeprazole (PRILOSEC) 20 MG capsule Take 1 capsule (20 mg total) by mouth daily.   oxybutynin (DITROPAN XL) 5 MG 24 hr tablet Take 1 tablet (5 mg total) by mouth at bedtime. (Patient taking differently: Take 5 mg by mouth daily as needed (for urinary incontinence).)   potassium chloride SA (KLOR-CON M) 20 MEQ tablet Take 1 tablet (20 mEq total) by mouth 2 (two) times daily.   Respiratory Therapy Supplies  (FLUTTER) DEVI Use as directed   Semaglutide, 1 MG/DOSE, 4 MG/3ML SOPN Inject 1 mg as directed once a week.   sodium chloride (OCEAN) 0.65 % nasal spray Place 2 sprays into both nostrils every 4 (four) hours. (Patient taking differently: Place 2 sprays into the nose every 4 (four) hours as needed for congestion.)   Tiotropium Bromide Monohydrate (SPIRIVA RESPIMAT) 2.5 MCG/ACT AERS Inhale 2 puffs into the lungs daily.   Past Medical History:  Diagnosis Date   Allergy    COPD (chronic obstructive pulmonary disease) (HCC)    Depression    GERD (gastroesophageal reflux disease)    Hernia, abdominal    History of chicken pox    Hyperlipidemia    Hypertension    Internal hemorrhoids with Grade 3 prolapse and bleeding 06/19/2013   Osteoarthritis    Personal history of colonic adenomas 06/25/2013   Past Surgical History:  Procedure Laterality Date   ABDOMINAL HYSTERECTOMY  63yrs ago   due to heavy bleeding and fibroids    COLONOSCOPY     HEMORRHOID BANDING  2014   HEMORRHOID SURGERY  31yrs agao   RIGHT HEART CATH N/A 10/12/2022   Procedure: RIGHT HEART CATH;  Surgeon: Orbie Pyo, MD;  Location: [facility];  Service: Cardiovascular;  Laterality: N/A;   UMBILICAL HERNIA REPAIR  06/2015   Social History   Social History Narrative   Regular exercise-no Caffeine Use-   family history includes Cancer in her father; Diabetes in her mother; Heart disease in her maternal uncle; Hypertension in her maternal grandmother, mother, sister, and sister; Stroke in her maternal grandmother.   Review of Systems As per HPI Otherwise negative Objective:   Physical Exam @BP  100/60 (BP Location: Left Arm, Patient Position: Sitting, Cuff Size: Large)   Pulse 84   Ht 5' 6.5" (1.689 m)   Wt 235 lb 4 oz (106.7 kg)   BMI 37.40 kg/m @  General:  NAD Eyes:   anicteric Lungs:  clear Heart::  S1S2 no rubs, murmurs or gallops Abdomen:  soft and nontender, BS+     Data Reviewed:  See  HPI

## 2023-11-18 ENCOUNTER — Other Ambulatory Visit (HOSPITAL_COMMUNITY): Payer: Self-pay

## 2023-11-22 ENCOUNTER — Other Ambulatory Visit: Payer: Self-pay | Admitting: Internal Medicine

## 2023-11-22 ENCOUNTER — Encounter (HOSPITAL_COMMUNITY): Payer: Self-pay | Admitting: Internal Medicine

## 2023-11-22 ENCOUNTER — Other Ambulatory Visit: Payer: Self-pay

## 2023-11-22 MED ORDER — FAMOTIDINE 20 MG PO TABS
20.0000 mg | ORAL_TABLET | Freq: Two times a day (BID) | ORAL | 0 refills | Status: DC | PRN
  Filled 2023-11-22: qty 60, 30d supply, fill #0

## 2023-11-23 ENCOUNTER — Encounter (HOSPITAL_COMMUNITY)
Admission: RE | Disposition: A | Discharge status: Home / Self Care | Payer: Self-pay | Source: Ambulatory Visit | Attending: Internal Medicine

## 2023-11-23 ENCOUNTER — Encounter (HOSPITAL_COMMUNITY): Payer: Self-pay | Admitting: Internal Medicine

## 2023-11-23 ENCOUNTER — Ambulatory Visit (HOSPITAL_COMMUNITY)
Admission: RE | Admit: 2023-11-23 | Discharge: 2023-11-23 | Disposition: A | Discharge status: Home / Self Care | Payer: 59 | Source: Ambulatory Visit | Attending: Internal Medicine | Admitting: Internal Medicine

## 2023-11-23 ENCOUNTER — Ambulatory Visit (HOSPITAL_COMMUNITY): Payer: 59 | Admitting: Anesthesiology

## 2023-11-23 ENCOUNTER — Other Ambulatory Visit: Payer: Self-pay

## 2023-11-23 DIAGNOSIS — D123 Benign neoplasm of transverse colon: Secondary | ICD-10-CM | POA: Diagnosis not present

## 2023-11-23 DIAGNOSIS — D122 Benign neoplasm of ascending colon: Secondary | ICD-10-CM | POA: Diagnosis present

## 2023-11-23 DIAGNOSIS — Z87891 Personal history of nicotine dependence: Secondary | ICD-10-CM | POA: Diagnosis not present

## 2023-11-23 DIAGNOSIS — N1832 Chronic kidney disease, stage 3b: Secondary | ICD-10-CM | POA: Diagnosis not present

## 2023-11-23 DIAGNOSIS — Z1211 Encounter for screening for malignant neoplasm of colon: Secondary | ICD-10-CM

## 2023-11-23 DIAGNOSIS — K635 Polyp of colon: Secondary | ICD-10-CM | POA: Diagnosis not present

## 2023-11-23 DIAGNOSIS — K648 Other hemorrhoids: Secondary | ICD-10-CM | POA: Insufficient documentation

## 2023-11-23 DIAGNOSIS — Z860100 Personal history of colon polyps, unspecified: Secondary | ICD-10-CM

## 2023-11-23 DIAGNOSIS — Z79899 Other long term (current) drug therapy: Secondary | ICD-10-CM | POA: Insufficient documentation

## 2023-11-23 DIAGNOSIS — Z860101 Personal history of adenomatous and serrated colon polyps: Secondary | ICD-10-CM | POA: Diagnosis not present

## 2023-11-23 DIAGNOSIS — N189 Chronic kidney disease, unspecified: Secondary | ICD-10-CM | POA: Diagnosis not present

## 2023-11-23 DIAGNOSIS — K219 Gastro-esophageal reflux disease without esophagitis: Secondary | ICD-10-CM | POA: Diagnosis not present

## 2023-11-23 DIAGNOSIS — J449 Chronic obstructive pulmonary disease, unspecified: Secondary | ICD-10-CM | POA: Insufficient documentation

## 2023-11-23 DIAGNOSIS — K5730 Diverticulosis of large intestine without perforation or abscess without bleeding: Secondary | ICD-10-CM | POA: Insufficient documentation

## 2023-11-23 DIAGNOSIS — D126 Benign neoplasm of colon, unspecified: Secondary | ICD-10-CM | POA: Diagnosis not present

## 2023-11-23 DIAGNOSIS — Z7985 Long-term (current) use of injectable non-insulin antidiabetic drugs: Secondary | ICD-10-CM | POA: Diagnosis not present

## 2023-11-23 DIAGNOSIS — Z09 Encounter for follow-up examination after completed treatment for conditions other than malignant neoplasm: Secondary | ICD-10-CM | POA: Diagnosis present

## 2023-11-23 DIAGNOSIS — E119 Type 2 diabetes mellitus without complications: Secondary | ICD-10-CM | POA: Diagnosis not present

## 2023-11-23 DIAGNOSIS — E1122 Type 2 diabetes mellitus with diabetic chronic kidney disease: Secondary | ICD-10-CM | POA: Diagnosis not present

## 2023-11-23 DIAGNOSIS — Z7984 Long term (current) use of oral hypoglycemic drugs: Secondary | ICD-10-CM | POA: Diagnosis not present

## 2023-11-23 DIAGNOSIS — K6389 Other specified diseases of intestine: Secondary | ICD-10-CM | POA: Diagnosis not present

## 2023-11-23 DIAGNOSIS — D125 Benign neoplasm of sigmoid colon: Secondary | ICD-10-CM | POA: Diagnosis present

## 2023-11-23 DIAGNOSIS — I129 Hypertensive chronic kidney disease with stage 1 through stage 4 chronic kidney disease, or unspecified chronic kidney disease: Secondary | ICD-10-CM | POA: Insufficient documentation

## 2023-11-23 DIAGNOSIS — K621 Rectal polyp: Secondary | ICD-10-CM | POA: Diagnosis not present

## 2023-11-23 HISTORY — PX: POLYPECTOMY: SHX5525

## 2023-11-23 HISTORY — PX: COLONOSCOPY WITH PROPOFOL: SHX5780

## 2023-11-23 LAB — GLUCOSE, CAPILLARY
Glucose-Capillary: 95 mg/dL (ref 70–99)
Glucose-Capillary: 75 mg/dL (ref 70–99)

## 2023-11-23 SURGERY — COLONOSCOPY WITH PROPOFOL
Anesthesia: Monitor Anesthesia Care

## 2023-11-23 MED ORDER — SODIUM CHLORIDE 0.9 % IV SOLN: INTRAVENOUS | Status: DC

## 2023-11-23 MED ORDER — PROPOFOL 500 MG/50ML IV EMUL
INTRAVENOUS | Status: DC | PRN
  Administered 2023-11-23: 40 mg via INTRAVENOUS
  Administered 2023-11-23: 80 ug/kg/min via INTRAVENOUS

## 2023-11-23 SURGICAL SUPPLY — 21 items
ELECT REM PT RETURN 9FT ADLT (ELECTROSURGICAL) IMPLANT
ELECTRODE REM PT RTRN 9FT ADLT (ELECTROSURGICAL) IMPLANT
FCP BXJMBJMB 240X2.8X (CUTTING FORCEPS)
FLOOR PAD 36X40 (MISCELLANEOUS) ×2 IMPLANT
FORCEPS BIOP RAD 4 LRG CAP 4 (CUTTING FORCEPS) IMPLANT
FORCEPS BIOP RJ4 240 W/NDL (CUTTING FORCEPS) IMPLANT
FORCEPS BXJMBJMB 240X2.8X (CUTTING FORCEPS) IMPLANT
INJECTOR/SNARE I SNARE (MISCELLANEOUS) IMPLANT
LUBRICANT JELLY 4.5OZ STERILE (MISCELLANEOUS) IMPLANT
MANIFOLD NEPTUNE II (INSTRUMENTS) IMPLANT
NDL SCLEROTHERAPY 25GX240 (NEEDLE) IMPLANT
NEEDLE SCLEROTHERAPY 25GX240 (NEEDLE) IMPLANT
PAD FLOOR 36X40 (MISCELLANEOUS) ×3 IMPLANT
PROBE APC STR FIRE (PROBE) IMPLANT
PROBE INJECTION GOLD 7FR (MISCELLANEOUS) IMPLANT
SNARE ROTATE MED OVAL 20MM (MISCELLANEOUS) IMPLANT
SYR 50ML LL SCALE MARK (SYRINGE) IMPLANT
TRAP SPECIMEN MUCOUS 40CC (MISCELLANEOUS) IMPLANT
TUBING ENDO SMARTCAP PENTAX (MISCELLANEOUS) IMPLANT
TUBING IRRIGATION ENDOGATOR (MISCELLANEOUS) ×3 IMPLANT
WATER STERILE IRR 1000ML POUR (IV SOLUTION) IMPLANT

## 2023-11-23 NOTE — Anesthesia Preprocedure Evaluation (Signed)
Anesthesia Evaluation  Patient identified by MRN, date of birth, ID band Patient awake    Reviewed: Allergy & Precautions, NPO status , Patient's Chart, lab work & pertinent test results  Airway Mallampati: II  TM Distance: >3 FB Neck ROM: Full    Dental  (+) Dental Advisory Given   Pulmonary COPD, former smoker   breath sounds clear to auscultation       Cardiovascular hypertension, Pt. on medications  Rhythm:Regular Rate:Normal     Neuro/Psych negative neurological ROS     GI/Hepatic Neg liver ROS,GERD  ,,  Endo/Other  diabetes, Type 2    Renal/GU CRFRenal disease     Musculoskeletal  (+) Arthritis ,    Abdominal   Peds  Hematology negative hematology ROS (+)   Anesthesia Other Findings   Reproductive/Obstetrics                             Anesthesia Physical Anesthesia Plan  ASA: 3  Anesthesia Plan: MAC   Post-op Pain Management:    Induction:   PONV Risk Score and Plan: 2 and Propofol infusion  Airway Management Planned: Natural Airway and Simple Face Mask  Additional Equipment:   Intra-op Plan:   Post-operative Plan:   Informed Consent: I have reviewed the patients History and Physical, chart, labs and discussed the procedure including the risks, benefits and alternatives for the proposed anesthesia with the patient or authorized representative who has indicated his/her understanding and acceptance.       Plan Discussed with: CRNA  Anesthesia Plan Comments:        Anesthesia Quick Evaluation

## 2023-11-23 NOTE — Interval H&P Note (Signed)
History and Physical Interval Note:  11/23/2023 10:46 AM  Krystal Allen  has presented today for surgery, with the diagnosis of History of polyps.  The various methods of treatment have been discussed with the patient and family. After consideration of risks, benefits and other options for treatment, the patient has consented to  Procedure(s): COLONOSCOPY WITH PROPOFOL (N/A) as a surgical intervention.  The patient's history has been reviewed, patient examined, no change in status, stable for surgery.  I have reviewed the patient's chart and labs.  Questions were answered to the patient's satisfaction.     Stan Head

## 2023-11-23 NOTE — Transfer of Care (Signed)
Immediate Anesthesia Transfer of Care Note  Patient: Krystal Allen  Procedure(s) Performed: COLONOSCOPY WITH PROPOFOL POLYPECTOMY  Patient Location: PACU  Anesthesia Type:MAC  Level of Consciousness: sedated  Airway & Oxygen Therapy: Patient Spontanous Breathing and Patient connected to face mask oxygen  Post-op Assessment: Report given to RN and Post -op Vital signs reviewed and stable  Post vital signs: Reviewed and stable  Last Vitals:  Vitals Value Taken Time  BP    Temp    Pulse 64 11/23/23 1128  Resp 19 11/23/23 1128  SpO2 99 % 11/23/23 1128  Vitals shown include unfiled device data.  Last Pain:  Vitals:   11/23/23 0925  TempSrc: Temporal  PainSc: 0-No pain         Complications: No notable events documented.

## 2023-11-23 NOTE — Discharge Instructions (Addendum)
I found and removed 4 tiny polys that look benign.  I will let you know pathology results and when to have another routine colonoscopy by mail and/or My Chart.  I appreciate the opportunity to care for you. Iva Boop, MD, FACG  YOU HAD AN ENDOSCOPIC PROCEDURE TODAY: Refer to the procedure report and other information in the discharge instructions given to you for any specific questions about what was found during the examination. If this information does not answer your questions, please call Dr. Marvell Fuller office at [phone] to clarify.   YOU SHOULD EXPECT: Some feelings of bloating in the abdomen. Passage of more gas than usual. Walking can help get rid of the air that was put into your GI tract during the procedure and reduce the bloating. If you had a lower endoscopy (such as a colonoscopy or flexible sigmoidoscopy) you may notice spotting of blood in your stool or on the toilet paper. Some abdominal soreness may be present for a day or two, also.  DIET: Your first meal following the procedure should be a light meal and then it is ok to progress to your normal diet. A half-sandwich or bowl of soup is an example of a good first meal. Heavy or fried foods are harder to digest and may make you feel nauseous or bloated. Drink plenty of fluids but you should avoid alcoholic beverages for 24 hours.   ACTIVITY: Your care partner should take you home directly after the procedure. You should plan to take it easy, moving slowly for the rest of the day. You can resume normal activity the day after the procedure however YOU SHOULD NOT DRIVE, use power tools, machinery or perform tasks that involve climbing or major physical exertion for 24 hours (because of the sedation medicines used during the test).   SYMPTOMS TO REPORT IMMEDIATELY: A gastroenterologist can be reached at any hour. Please call [phone]  for any of the following symptoms:  Following lower endoscopy (colonoscopy, flexible  sigmoidoscopy) Excessive amounts of blood in the stool  Significant tenderness, worsening of abdominal pains  Swelling of the abdomen that is new, acute  Fever of 100 or higher    FOLLOW UP:  If any biopsies were taken you will be contacted by phone or by letter within the next 1-3 weeks. Call [phone]  if you have not heard about the biopsies in 3 weeks.  Please also call with any specific questions about appointments or follow up tests.

## 2023-11-23 NOTE — Op Note (Signed)
[facility] Patient Name: Krystal Allen Procedure Date: 11/23/2023 MRN: [id number] Attending MD: Iva Boop , MD, [id number] Date of Birth: 07/02/1956 CSN: [id number] Age: 68 Admit Type: Outpatient Procedure:                Colonoscopy Indications:              Surveillance: Personal history of adenomatous                            polyps on last colonoscopy > 3 years ago, Last                            colonoscopy: 2021 Providers:                Iva Boop, MD, Zoe Lan, RN, Alan Ripper,                            Technician Referring MD:              Medicines:                Monitored Anesthesia Care Complications:            No immediate complications. Estimated Blood Loss:     Estimated blood loss was minimal. Procedure:                Pre-Anesthesia Assessment:                           - Prior to the procedure, a History and Physical                            was performed, and patient medications and                            allergies were reviewed. The patient's tolerance of                            previous anesthesia was also reviewed. The risks                            and benefits of the procedure and the sedation                            options and risks were discussed with the patient.                            All questions were answered, and informed consent                            was obtained. Prior Anticoagulants: The patient has                            taken no anticoagulant or antiplatelet agents. ASA  Grade Assessment: III - A patient with severe                            systemic disease. After reviewing the risks and                            benefits, the patient was deemed in satisfactory                            condition to undergo the procedure.                           After obtaining informed consent, the colonoscope                            was passed under direct vision.  Throughout the                            procedure, the patient's blood pressure, pulse, and                            oxygen saturations were monitored continuously. The                            CF-HQ190L ([id number] Olympus colonoscope was                            introduced through the anus and advanced to the the                            cecum, identified by appendiceal orifice and                            ileocecal valve. The colonoscopy was performed                            without difficulty. The patient tolerated the                            procedure well. The quality of the bowel                            preparation was good. The ileocecal valve,                            appendiceal orifice, and rectum were photographed.                            The bowel preparation used was SUPREP via split                            dose instruction. Scope In: 10:58:11 AM Scope Out: 11:22:53 AM Scope Withdrawal Time: 0 hours 16 minutes 31 seconds  Total Procedure Duration: 0 hours 24 minutes 42 seconds  Findings:      The perianal and digital rectal examinations were normal.      Four sessile polyps were found in the sigmoid colon, transverse colon       and ascending colon. The polyps were diminutive in size. These polyps       were removed with a cold snare. Resection and retrieval were complete.       Verification of patient identification for the specimen was done.       Estimated blood loss was minimal.      A few diverticula were found in the sigmoid colon.      Internal hemorrhoids were found.      The exam was otherwise without abnormality on direct and retroflexion       views. Impression:               - Four diminutive polyps in the sigmoid colon, in                            the transverse colon and in the ascending colon,                            removed with a cold snare. Resected and retrieved.                           - Diverticulosis in the sigmoid  colon.                           - Internal hemorrhoids.                           - The examination was otherwise normal on direct                            and retroflexion views except for several                            classic-appearing hyperplastic polyps in distal                            sigmoid and rectum (flattening w/ insufflation +                            NBI assessment)                           - Personal history of colonic polyps. 2014 2                            adenomas 2021 8 adenomas max 10 mm Moderate Sedation:      Not Applicable - Patient had care per Anesthesia. Recommendation:           - Patient has a contact number available for                            emergencies. The signs and symptoms of potential  delayed complications were discussed with the                            patient. Return to normal activities tomorrow.                            Written discharge instructions were provided to the                            patient.                           - Resume previous diet.                           - Continue present medications.                           - Repeat colonoscopy is recommended for                            surveillance. The colonoscopy date will be                            determined after pathology results from today's                            exam become available for review. Procedure Code(s):        --- Professional ---                           [phone], Colonoscopy, flexible; with removal of                            tumor(s), polyp(s), or other lesion(s) by snare                            technique Diagnosis Code(s):        --- Professional ---                           Z86.010, Personal history of colonic polyps                           D12.5, Benign neoplasm of sigmoid colon                           D12.3, Benign neoplasm of transverse colon (hepatic                            flexure  or splenic flexure)                           D12.2, Benign neoplasm of ascending colon                           K64.8, Other hemorrhoids  K57.30, Diverticulosis of large intestine without                            perforation or abscess without bleeding CPT copyright 2022 [employer]. All rights reserved. The codes documented in this report are preliminary and upon coder review may  be revised to meet current compliance requirements. Iva Boop, MD 11/23/2023 11:36:49 AM This report has been signed electronically. Number of Addenda: 0

## 2023-11-24 DIAGNOSIS — R0602 Shortness of breath: Secondary | ICD-10-CM | POA: Diagnosis not present

## 2023-11-24 LAB — SURGICAL PATHOLOGY

## 2023-11-27 ENCOUNTER — Encounter (HOSPITAL_COMMUNITY): Payer: Self-pay | Admitting: Internal Medicine

## 2023-11-27 NOTE — Anesthesia Postprocedure Evaluation (Signed)
Anesthesia Post Note  Patient: Krystal Allen  Procedure(s) Performed: COLONOSCOPY WITH PROPOFOL POLYPECTOMY     Patient location during evaluation: PACU Anesthesia Type: MAC Level of consciousness: awake and alert Pain management: pain level controlled Vital Signs Assessment: post-procedure vital signs reviewed and stable Respiratory status: spontaneous breathing, nonlabored ventilation, respiratory function stable and patient connected to nasal cannula oxygen Cardiovascular status: stable and blood pressure returned to baseline Postop Assessment: no apparent nausea or vomiting Anesthetic complications: no   No notable events documented.  Last Vitals:  Vitals:   11/23/23 1140 11/23/23 1150  BP: 107/67 (!) 118/52  Pulse: (!) 59 65  Resp: 17 12  Temp:    SpO2: 100% 99%    Last Pain:  Vitals:   11/23/23 1150  TempSrc:   PainSc: 0-No pain                 Kennieth Rad

## 2023-11-28 ENCOUNTER — Encounter: Payer: Self-pay | Admitting: Internal Medicine

## 2023-12-08 ENCOUNTER — Ambulatory Visit: Payer: 59 | Attending: Internal Medicine | Admitting: Internal Medicine

## 2023-12-08 ENCOUNTER — Encounter: Payer: Self-pay | Admitting: Internal Medicine

## 2023-12-08 ENCOUNTER — Other Ambulatory Visit: Payer: Self-pay

## 2023-12-08 DIAGNOSIS — J9611 Chronic respiratory failure with hypoxia: Secondary | ICD-10-CM | POA: Diagnosis not present

## 2023-12-08 DIAGNOSIS — L988 Other specified disorders of the skin and subcutaneous tissue: Secondary | ICD-10-CM | POA: Diagnosis not present

## 2023-12-08 DIAGNOSIS — Z7985 Long-term (current) use of injectable non-insulin antidiabetic drugs: Secondary | ICD-10-CM

## 2023-12-08 DIAGNOSIS — E1159 Type 2 diabetes mellitus with other circulatory complications: Secondary | ICD-10-CM | POA: Diagnosis not present

## 2023-12-08 DIAGNOSIS — I152 Hypertension secondary to endocrine disorders: Secondary | ICD-10-CM | POA: Diagnosis not present

## 2023-12-08 DIAGNOSIS — Z2821 Immunization not carried out because of patient refusal: Secondary | ICD-10-CM | POA: Insufficient documentation

## 2023-12-08 DIAGNOSIS — E785 Hyperlipidemia, unspecified: Secondary | ICD-10-CM

## 2023-12-08 DIAGNOSIS — N1832 Chronic kidney disease, stage 3b: Secondary | ICD-10-CM | POA: Diagnosis not present

## 2023-12-08 DIAGNOSIS — Z803 Family history of malignant neoplasm of breast: Secondary | ICD-10-CM

## 2023-12-08 DIAGNOSIS — Z9981 Dependence on supplemental oxygen: Secondary | ICD-10-CM

## 2023-12-08 DIAGNOSIS — E1169 Type 2 diabetes mellitus with other specified complication: Secondary | ICD-10-CM

## 2023-12-08 DIAGNOSIS — J449 Chronic obstructive pulmonary disease, unspecified: Secondary | ICD-10-CM

## 2023-12-08 LAB — GLUCOSE, POCT (MANUAL RESULT ENTRY): POC Glucose: 88 mg/dL (ref 70–99)

## 2023-12-08 LAB — POCT GLYCOSYLATED HEMOGLOBIN (HGB A1C): HbA1c, POC (controlled diabetic range): 5.9 % (ref 0.0–7.0)

## 2023-12-08 MED ORDER — DOXYCYCLINE HYCLATE 100 MG PO TABS
100.0000 mg | ORAL_TABLET | Freq: Two times a day (BID) | ORAL | 0 refills | Status: DC
  Filled 2023-12-08 (×2): qty 14, 7d supply, fill #0

## 2023-12-08 MED ORDER — SEMAGLUTIDE (2 MG/DOSE) 8 MG/3ML ~~LOC~~ SOPN
2.0000 mg | PEN_INJECTOR | SUBCUTANEOUS | 3 refills | Status: DC
  Filled 2023-12-08: qty 3, fill #0
  Filled 2023-12-08: qty 3, 28d supply, fill #0
  Filled 2024-01-01: qty 3, 28d supply, fill #1

## 2023-12-08 NOTE — Progress Notes (Signed)
 Patient ID: MAILIN COGLIANESE, female    DOB: 07-05-1956  MRN: [id number]  CC: Diabetes (DM f/u/No questions / concerns/No to all vax)   Subjective: Any Mcneice is a 68 y.o. female who presents for chronic ds management. Her concerns today include:  Pt with hx of HTN, HL, aortic atherosclerosis, COPD, ILD (pulmonary Langerhans' cell cell histiocytosis), nocturnal hypoxia followed by pulmonary, GERD, CKD 2-3, DM/obesity, former tob dep, DJD c-spine, spinal stenosis, stress incont, adenomatous colon polyp.     Discussed the use of AI scribe software for clinical note transcription with the patient, who gave verbal consent to proceed.  History of Present Illness   The patient presents with a superficial wound on the right nipple, first noticed after a mammogram on the fifteenth of last month. She reports that the wound has been present since then, despite a previous episode of a similar wound that resolved with treatment. The wound is described as sore, with white drainage. The patient denies any other symptoms related to the wound. She has a family history of breast cancer, with both a daughter and sister affected, but it is unknown if she has been tested for the BRCA gene. I copy part of my note from 01/11/22 visit when she had similar episode:  Had seen NP several weeks ago for redness and soreness of the right breast around the nipple.  Diagnosed with mastitis.  Given doxycycline .  Patient called in earlier this month requesting another round of doxycycline  which I prescribed.  She reports that the breast is a lot better.  Still a little sore. -Reports chronic milk discharge from the nipple when the nipple is squeezed.  States that this has been present ever since she had her last child.  Reports that this was evaluated previously by another physician and she was told that as long as it does not have any blood in it it is okay.    DM/Obesity: Results for orders placed or performed in visit on  12/08/23  POCT glucose (manual entry)   Collection Time: 12/08/23 10:17 AM  Result Value Ref Range   POC Glucose 88 70 - 99 mg/dl  POCT glycosylated hemoglobin (Hb A1C)   Collection Time: 12/08/23 10:21 AM  Result Value Ref Range   Hemoglobin A1C     HbA1c POC (<> result, manual entry)     HbA1c, POC (prediabetic range)     HbA1c, POC (controlled diabetic range) 5.9 0.0 - 7.0 %   managing her diabetes with Ozempic , which has resulted in a decrease in appetite and a weight loss of 25 pounds since starting the medication. She reports no side effects from the Ozempic  and is willing to increase the dosage from one milligram to two milligrams. She also reports walking once a week for about 15 minutes.  COPD:  The patient also has COPD and is using Breo and Spiriva  inhalers daily. She has been prescribed oxygen  for use at night, but only uses it when she has a cold and finds it hard to breathe.   HTN:  She also has hypertension and high cholesterol, which are managed with Cozaar  25 and hydrochlorothiazide  25 mg.  Furosemide  to use as needed.  Checks blood pressure intermittently.  Reports readings have been good.  No chest pains or significant shortness of breath.  No lower extremity edema.  HL/aortic atherosclerosis/mild LAD: On Repatha , atorvastatin  and Zetia  respectively.     Patient Active Problem List   Diagnosis Date Noted  Chronic hypoxic respiratory failure, on home oxygen  therapy (HCC) 12/08/2023   Herpes zoster vaccination declined 12/08/2023   Anxiety 04/03/2023   History of panic attacks 04/03/2023   Ventral hernia 04/03/2023   Chemosis of conjunctiva, bilateral 02/27/2023   Type 2 diabetes mellitus with chronic kidney disease, without long-term current use of insulin  (HCC) 02/27/2023   CKD stage 3b, GFR 30-44 ml/min (HCC) - Baseline Scr 1.5 02/27/2023   Morbid obesity (HCC) 03/09/2022   Seasonal allergies 02/07/2022   Mastitis, right, acute 12/23/2021   Influenza vaccine  needed 09/14/2020   Tobacco dependence 05/12/2020   Aortic atherosclerosis (HCC) 05/12/2020   Adenomatous polyp of colon 05/12/2020   Langerhan's cell histiocytosis (HCC) 01/26/2018   Cigarette smoker 01/26/2018   Osteoarthritis of cervical spine 12/22/2017   Prediabetes 12/22/2017   Pain of left calf 04/06/2017   Centrilobular emphysema (HCC) 02/23/2016   Obesity (BMI 30-39.9) 01/15/2016   Bilateral shoulder pain 01/15/2016   Esophageal reflux 01/15/2016   Tobacco abuse 05/13/2015   Lung nodule 08/14/2014   Impaired fasting glucose 08/14/2014   Hyperlipidemia 08/14/2014   Hx of adenomatous colonic polyps 06/25/2013   Hypertension associated with type 2 diabetes mellitus (HCC) 05/20/2013     Current Outpatient Medications on File Prior to Visit  Medication Sig Dispense Refill   Accu-Chek Softclix Lancets lancets Use to check blood sugar three times daily. E11.69 100 each 6   acetaminophen  (TYLENOL  8 HOUR) 650 MG CR tablet Take 1 tablet (650 mg total) by mouth 2 (two) times daily as needed for pain. 60 tablet 5   albuterol  (PROAIR  HFA) 108 (90 Base) MCG/ACT inhaler Inhale 2 puffs into the lungs every 4 (four) hours as needed for wheezing or shortness of breath. 6.7 g 0   albuterol  (PROVENTIL ) (2.5 MG/3ML) 0.083% nebulizer solution TAKE 3 MLS BY NEBULIZATION EVERY 6 (SIX) HOURS AS NEEDED FOR WHEEZING OR SHORTNESS OF BREATH. 90 mL 1   aspirin  EC 81 MG tablet Take 1 tablet (81 mg total) by mouth daily. 100 tablet 1   atorvastatin  (LIPITOR) 80 MG tablet Take 1 tablet (80 mg total) by mouth daily. 90 tablet 3   Blood Glucose Monitoring Suppl (ACCU-CHEK GUIDE) w/Device KIT Use to check blood sugar three times daily. E11.69 1 kit 0   cetirizine  (ZYRTEC ) 10 MG tablet Take 1 tablet (10 mg total) by mouth daily. 30 tablet 11   Continuous Glucose Receiver (FREESTYLE LIBRE 2 READER) DEVI Use to check blood sugar continuously throughout the day. Change sensors once every 14 days. 1 each 0    Continuous Glucose Sensor (FREESTYLE LIBRE 2 SENSOR) MISC Use to check blood sugar continuously throughout the day. 2 each 6   diphenhydrAMINE  (BENADRYL ) 25 mg capsule Take 25-50 mg by mouth every 6 (six) hours as needed for itching or allergies.     Elastic Bandages & Supports (MEDICAL COMPRESSION STOCKINGS) MISC R60.0 1 each 0   empagliflozin  (JARDIANCE ) 10 MG TABS tablet Take 1 tablet (10 mg total) by mouth daily before breakfast. 90 tablet 3   EPINEPHrine  0.3 mg/0.3 mL IJ SOAJ injection Inject 0.3 mg into the muscle as needed for anaphylaxis. 2 each 2   Evolocumab  (REPATHA  SURECLICK) 140 MG/ML SOAJ Inject 280 mg into the skin every 14 (fourteen) days. 6 mL 3   ezetimibe  (ZETIA ) 10 MG tablet Take 1 tablet (10 mg total) by mouth daily. 90 tablet 2   famotidine  (PEPCID ) 20 MG tablet Take 1 tablet (20 mg total) by mouth 2 (two) times  daily as needed (hives). 60 tablet 0   fexofenadine  (ALLEGRA  ALLERGY ) 180 MG tablet Take 1 tablet (180 mg total) by mouth 2 (two) times daily as needed (hives). 60 tablet 5   fluticasone  (FLONASE ) 50 MCG/ACT nasal spray Place 2 sprays into both nostrils daily. 16 g 5   fluticasone  furoate-vilanterol (BREO ELLIPTA ) 200-25 MCG/ACT AEPB Inhale 1 puff into the lungs once daily. 60 each 11   furosemide  (LASIX ) 20 MG tablet Take 1 tablet (20 mg total) by mouth daily as needed (for weight gain of 2-3 lbs overnight or swelling in legs/ankles). 90 tablet 2   glucose blood (ACCU-CHEK GUIDE) test strip Use to check blood sugar three times daily. E11.69 100 each 12   hydrochlorothiazide  (HYDRODIURIL ) 25 MG tablet Take 1 tablet (25 mg total) by mouth daily. 90 tablet 1   hydrOXYzine  (ATARAX ) 25 MG tablet Take 1 tablet (25 mg total) by mouth every 8 (eight) hours as needed for up to 15 doses for itching. 15 tablet 0   Insulin  Pen Needle 32G X 4 MM MISC Use 4 times daily with insulin  pen. 100 each 0   losartan  (COZAAR ) 25 MG tablet Take 1 tablet (25 mg total) by mouth daily. 90 tablet  3   Olopatadine  HCl 0.2 % SOLN Place 1 drop into both eyes daily as needed (allergies). 2.5 mL 5   omeprazole  (PRILOSEC) 20 MG capsule Take 1 capsule (20 mg total) by mouth daily. 90 capsule 3   oxybutynin  (DITROPAN  XL) 5 MG 24 hr tablet Take 1 tablet (5 mg total) by mouth at bedtime. (Patient taking differently: Take 5 mg by mouth daily as needed (for urinary incontinence).) 30 tablet 0   OXYGEN  Inhale 2 L/min into the lungs as needed.     potassium chloride  SA (KLOR-CON  M) 20 MEQ tablet Take 1 tablet (20 mEq total) by mouth 2 (two) times daily. 180 tablet 2   Respiratory Therapy Supplies (FLUTTER) DEVI Use as directed 1 each 0   sodium chloride  (OCEAN) 0.65 % nasal spray Place 2 sprays into both nostrils every 4 (four) hours. (Patient taking differently: Place 2 sprays into the nose every 4 (four) hours as needed for congestion.) 44 mL 0   Tiotropium Bromide  Monohydrate (SPIRIVA  RESPIMAT) 2.5 MCG/ACT AERS Inhale 2 puffs into the lungs daily. 4 g 11   triamcinolone  cream (KENALOG ) 0.1 % Apply 1 Application topically 2 (two) times daily. 30 g 0   No current facility-administered medications on file prior to visit.    Allergies  Allergen Reactions   Sulfa Antibiotics Anaphylaxis and Hives   Chantix  [Varenicline  Tartrate]     Bad dreams   Fish Allergy  Other (See Comments)    Severe stomach cramps    Hygroton  [Chlorthalidone ]     Swelling or hives   Penicillins Other (See Comments)    Pt took Amoxicillin  in 2019 without issue, reports PCN caused rash in the past   Latex Rash and Other (See Comments)    Dry skin    Social History   Socioeconomic History   Marital status: Legally Separated    Spouse name: Not on file   Number of children: Not on file   Years of education: 12+   Highest education level: Not on file  Occupational History   Occupation: PCA    Employer: [employer]  Tobacco Use   Smoking status: Former    Current packs/day: 0.00    Average packs/day: 0.5 packs/day  for 40.0 years (20.0 ttl pk-yrs)  Types: Cigarettes    Start date: 07/1981    Quit date: 07/2021    Years since quitting: 2.3    Passive exposure: Never   Smokeless tobacco: Never   Tobacco comments:    Quit 102022  Vaping Use   Vaping status: Former  Substance and Sexual Activity   Alcohol use: Yes    Comment: occasional   Drug use: No   Sexual activity: Not Currently  Other Topics Concern   Not on file  Social History Narrative   Regular exercise-no Caffeine Use-   Social Drivers of Health   Financial Resource Strain: Low Risk  (12/08/2023)   Overall Financial Resource Strain (CARDIA)    Difficulty of Paying Living Expenses: Not very hard  Food Insecurity: No Food Insecurity (12/08/2023)   Hunger Vital Sign    Worried About Running Out of Food in the Last Year: Never true    Ran Out of Food in the Last Year: Never true  Transportation Needs: No Transportation Needs (12/08/2023)   PRAPARE - Administrator, Civil Service (Medical): No    Lack of Transportation (Non-Medical): No  Physical Activity: Insufficiently Active (12/08/2023)   Exercise Vital Sign    Days of Exercise per Week: 1 day    Minutes of Exercise per Session: 10 min  Stress: No Stress Concern Present (12/08/2023)   [employer] of Occupational Health - Occupational Stress Questionnaire    Feeling of Stress : Not at all  Social Connections: Moderately Isolated (12/08/2023)   Social Connection and Isolation Panel [NHANES]    Frequency of Communication with Friends and Family: More than three times a week    Frequency of Social Gatherings with Friends and Family: More than three times a week    Attends Religious Services: Never    Database Administrator or Organizations: Yes    Attends Banker Meetings: Never    Marital Status: Separated  Intimate Partner Violence: Not At Risk (12/08/2023)   Humiliation, Afraid, Rape, and Kick questionnaire    Fear of Current or Ex-Partner: No     Emotionally Abused: No    Physically Abused: No    Sexually Abused: No    Family History  Problem Relation Age of Onset   Cancer Father        prostate   Hypertension Mother    Diabetes Mother    Hypertension Sister    Heart disease Maternal Uncle    Stroke Maternal Grandmother    Hypertension Maternal Grandmother    Hypertension Sister    Colon cancer Neg Hx    Stomach cancer Neg Hx    Rectal cancer Neg Hx    Colon polyps Neg Hx     Past Surgical History:  Procedure Laterality Date   ABDOMINAL HYSTERECTOMY  76yrs ago   due to heavy bleeding and fibroids    COLONOSCOPY     COLONOSCOPY WITH PROPOFOL  N/A 11/23/2023   Procedure: COLONOSCOPY WITH PROPOFOL ;  Surgeon: Avram Lupita BRAVO, MD;  Location: [facility];  Service: Gastroenterology;  Laterality: N/A;   HEMORRHOID BANDING  2014   HEMORRHOID SURGERY  70yrs agao   POLYPECTOMY  11/23/2023   Procedure: POLYPECTOMY;  Surgeon: Avram Lupita BRAVO, MD;  Location: [facility];  Service: Gastroenterology;;   RIGHT HEART CATH N/A 10/12/2022   Procedure: RIGHT HEART CATH;  Surgeon: Wendel Lurena POUR, MD;  Location: [facility];  Service: Cardiovascular;  Laterality: N/A;   UMBILICAL HERNIA REPAIR  06/2015  ROS: Review of Systems Negative except as stated above  PHYSICAL EXAM: BP 101/64 (BP Location: Left Arm, Patient Position: Sitting, Cuff Size: Large)   Pulse 63   Temp 98.2 F (36.8 C) (Oral)   Ht 5' 6 (1.676 m)   Wt 236 lb (107 kg)   SpO2 96%   BMI 38.09 kg/m   Wt Readings from Last 3 Encounters:  12/08/23 236 lb (107 kg)  11/23/23 235 lb (106.6 kg)  11/15/23 235 lb 4 oz (106.7 kg)    Physical Exam  General appearance - alert, well appearing, older African-American female and in no distress Mental status - normal mood, behavior, speech, dress, motor activity, and thought processes Neck - supple, no significant adenopathy Chest - clear to auscultation, no wheezes, rales or rhonchi, symmetric air entry Heart  - normal rate, regular rhythm, normal S1, S2, no murmurs, rubs, clicks or gallops Breasts -CMA Clarissa is present as chaperone: She is noted to have irregular protruding shape to the right nipple.  There appears to be 2 small fluctuant areas.  When the nipple is squeezed, some white discharge appears from these 2 areas.  No masses palpated in either breast. Extremities -no lower extremity edema      Latest Ref Rng & Units 09/11/2023    8:51 AM 08/04/2023   10:27 AM 03/10/2023   10:27 AM  CMP  Glucose 70 - 99 mg/dL 80  96  89   BUN 8 - 27 mg/dL 18  18  29    Creatinine 0.57 - 1.00 mg/dL 8.51  8.58  8.27   Sodium 134 - 144 mmol/L 141  141  139   Potassium 3.5 - 5.2 mmol/L 3.6  3.3  3.9   Chloride 96 - 106 mmol/L 100  101  99   CO2 20 - 29 mmol/L 28  26  24    Calcium  8.7 - 10.3 mg/dL 9.3  9.1  9.4    Lipid Panel     Component Value Date/Time   CHOL 104 12/02/2022 0909   TRIG 102 12/02/2022 0909   HDL 52 12/02/2022 0909   CHOLHDL 2.0 12/02/2022 0909   CHOLHDL 3.7 08/17/2021 0340   VLDL 12 08/17/2021 0340   LDLCALC 33 12/02/2022 0909   LDLDIRECT 190.1 04/16/2013 1531    CBC    Component Value Date/Time   WBC 6.5 02/26/2023 1849   RBC 4.91 02/26/2023 1849   HGB 13.9 02/26/2023 2033   HGB 14.4 10/03/2022 1120   HCT 41.0 02/26/2023 2033   HCT 41.8 10/03/2022 1120   PLT 281 02/26/2023 1849   PLT 260 10/03/2022 1120   MCV 88.8 02/26/2023 1849   MCV 85 10/03/2022 1120   MCH 28.5 02/26/2023 1849   MCHC 32.1 02/26/2023 1849   RDW 17.9 (H) 02/26/2023 1849   RDW 17.9 (H) 10/03/2022 1120   LYMPHSABS 1.9 02/26/2023 1849   LYMPHSABS 2.2 02/10/2020 1000   MONOABS 0.3 02/26/2023 1849   EOSABS 0.0 02/26/2023 1849   EOSABS 0.2 02/10/2020 1000   BASOSABS 0.0 02/26/2023 1849   BASOSABS 0.1 02/10/2020 1000    ASSESSMENT AND PLAN: 1. Type 2 diabetes mellitus with morbid obesity (HCC) (Primary) A1c is at goal.  She has lost about 25 pounds total since starting Ozempic  in the summer of  last year.  Encouraged her to continue trying to eat healthy.  Challenged her to increase her walking to twice a week. Discussed increasing the Ozempic  to 2 mg once a week since she is  tolerating the 1 mg.  She is agreeable to doing so.  I went over potential side effects with her again to watch out for including vomiting, severe abdominal pain, severe constipation/diarrhea.  If any of these occur, she should stop the medicine and be seen. - POCT glycosylated hemoglobin (Hb A1C) - POCT glucose (manual entry) - Semaglutide , 2 MG/DOSE, 8 MG/3ML SOPN; Inject 2 mg as directed once a week.  Dispense: 3 mL; Refill: 3  2. Long-term (current) use of injectable non-insulin  antidiabetic drugs See #1 above  3. Lesion of skin of breast Mammogram and ultrasound done 3 weeks ago look okay but did identify a small superficial wound along the outer right nipple.  This is seen today on exam, is sore to touch and white substance expressed from it.  Given that this is the second time this has occurred and patient has strong family history of breast cancer, I will put her on some antibiotics and refer her to general surgeon to see if biopsy needs to be done of this area - Ambulatory referral to General Surgery - doxycycline  (VIBRA -TABS) 100 MG tablet; Take 1 tablet (100 mg total) by mouth 2 (two) times daily.  Dispense: 14 tablet; Refill: 0  4. Hyperlipidemia due to type 2 diabetes mellitus (HCC) Continue Repatha , atorvastatin  and Zetia  as directed by cardiology  5. Hypertension associated with type 2 diabetes mellitus (HCC) At goal.  Continue Cozaar  25 mg daily and hydrochlorothiazide  25 mg   6. Stage 3b chronic kidney disease (HCC) Stable.  We will continue to monitor.  She is plugged in with [facility].  7. Chronic obstructive pulmonary disease, unspecified COPD type (HCC) Stable.  Continue Breo and Spiriva   8. Chronic hypoxic respiratory failure, on home oxygen  therapy [facility]) Patient is  supposed to use this at night.  However she states she uses it at nights only when she has a cold/upper respiratory infection  9. Family history of breast cancer See #3 above - Ambulatory referral to General Surgery  10. Herpes zoster vaccination declined Patient declined shingles vaccine.    Patient was given the opportunity to ask questions.  Patient verbalized understanding of the plan and was able to repeat key elements of the plan.   This documentation was completed using Paediatric nurse.  Any transcriptional errors are unintentional.  Orders Placed This Encounter  Procedures   Ambulatory referral to General Surgery   POCT glycosylated hemoglobin (Hb A1C)   POCT glucose (manual entry)     Requested Prescriptions   Signed Prescriptions Disp Refills   Semaglutide , 2 MG/DOSE, 8 MG/3ML SOPN 3 mL 3    Sig: Inject 2 mg as directed once a week.   doxycycline  (VIBRA -TABS) 100 MG tablet 14 tablet 0    Sig: Take 1 tablet (100 mg total) by mouth 2 (two) times daily.    Return in about 4 months (around 04/06/2024).  Barnie Louder, MD, FACP

## 2023-12-08 NOTE — Patient Instructions (Signed)
 Increase Ozempic  to 2 mg once a week.  Again please watch out for any vomiting, abdominal pain, severe diarrhea or constipation.  If this occurs please stop the medicine and let us  know.  I have referred you to the general surgeon for the issue that you are having with your right breast.

## 2023-12-11 ENCOUNTER — Other Ambulatory Visit: Payer: Self-pay | Admitting: Internal Medicine

## 2023-12-11 ENCOUNTER — Other Ambulatory Visit (HOSPITAL_COMMUNITY): Payer: Self-pay

## 2023-12-11 ENCOUNTER — Other Ambulatory Visit: Payer: Self-pay

## 2023-12-11 DIAGNOSIS — K219 Gastro-esophageal reflux disease without esophagitis: Secondary | ICD-10-CM

## 2023-12-11 MED ORDER — OMEPRAZOLE 20 MG PO CPDR
20.0000 mg | DELAYED_RELEASE_CAPSULE | Freq: Every day | ORAL | 3 refills | Status: AC
  Filled 2023-12-11: qty 90, 90d supply, fill #0
  Filled 2024-03-31: qty 90, 90d supply, fill #1
  Filled 2024-07-06: qty 90, 90d supply, fill #2
  Filled 2024-10-04: qty 90, 90d supply, fill #3

## 2023-12-11 MED ORDER — FLUTICASONE PROPIONATE 50 MCG/ACT NA SUSP
2.0000 | Freq: Every day | NASAL | 0 refills | Status: DC
  Filled 2023-12-11: qty 16, 30d supply, fill #0

## 2023-12-18 ENCOUNTER — Ambulatory Visit: Payer: Self-pay | Admitting: Surgery

## 2023-12-18 DIAGNOSIS — N61 Mastitis without abscess: Secondary | ICD-10-CM | POA: Diagnosis not present

## 2023-12-18 DIAGNOSIS — K439 Ventral hernia without obstruction or gangrene: Secondary | ICD-10-CM | POA: Diagnosis not present

## 2023-12-18 NOTE — H&P (View-Only) (Signed)
 Subjective    Chief Complaint: New Patient (Lesion of skin breast rt nipple )       History of Present Illness: Krystal Allen is a 68 y.o. female who is seen today as an office consultation at the request of Dr. Laural Benes for evaluation of New Patient (Lesion of skin breast rt nipple ) .   This is a 68 year old female who presents with a 1-month history of a tender area on the lateral side of her right nipple with some milky purulent drainage.  The patient states that previously she just had a small lump on the side of her nipple.  She had a recent mammogram that was unremarkable.  She presents now to discuss excision of this area to prevent recurrence.   The patient has had a previous umbilical hernia repair many years ago performed in [facility].  She has developed a lump in her epigastrium above her previous incision.  This has enlarged and become uncomfortable.  She had a CT scan last year for examination of her abdominal aorta.  Incidental finding on this showed a small epigastric hernia with a 1.5 cm fascial defect.  She denies any GI obstructive symptoms.     Review of Systems: A complete review of systems was obtained from the patient.  I have reviewed this information and discussed as appropriate with the patient.  See HPI as well for other ROS.   Review of Systems  Constitutional: Negative.   HENT: Negative.    Eyes: Negative.   Respiratory: Negative.    Cardiovascular: Negative.   Gastrointestinal: Negative.   Genitourinary: Negative.   Musculoskeletal: Negative.   Skin: Negative.   Neurological: Negative.   Endo/Heme/Allergies: Negative.   Psychiatric/Behavioral: Negative.          Medical History: Past Medical History      Past Medical History:  Diagnosis Date   COPD (chronic obstructive pulmonary disease) (CMS/HHS-HCC)     DVT (deep venous thrombosis) (CMS/HHS-HCC)     Hypertension          Problem List     Patient Active Problem List  Diagnosis    Adenomatous polyp of colon   Anxiety   Aortic atherosclerosis (CMS-HCC)   Bilateral shoulder pain   Centrilobular emphysema (CMS/HHS-HCC)   Cigarette smoker   Chronic hypoxic respiratory failure, on home oxygen therapy (CMS/HHS-HCC)   CKD stage 3b, GFR 30-44 ml/min (CMS/HHS-HCC)   COPD (chronic obstructive pulmonary disease) (CMS/HHS-HCC)   Esophageal reflux   Hyperlipidemia   Hypertension associated with type 2 diabetes mellitus  (CMS/HHS-HCC)   Langerhan's cell histiocytosis (CMS/HHS-HCC)   Morbid obesity (CMS/HHS-HCC)   Obesity (BMI 30-39.9), unspecified   Tobacco abuse   Type 2 diabetes mellitus with chronic kidney disease, without long-term current use of insulin (CMS/HHS-HCC)   Uncomplicated epigastric hernia   Nipple infection        Past Surgical History       Past Surgical History:  Procedure Laterality Date   INTRO CATH TO MAIN PULMONARY ARTERY/RIGHT HEART Right 10/12/2022        Allergies      Allergies  Allergen Reactions   Hygroton [Chlorthalidone] Other (See Comments)   Latex, Natural Rubber Rash   Penicillin Other (See Comments)   Sulfa (Sulfonamide Antibiotics) Other (See Comments)        Medications Ordered Prior to Encounter        Current Outpatient Medications on File Prior to Visit  Medication Sig Dispense Refill   acetaminophen (TYLENOL)  650 MG ER tablet Take 650 mg by mouth every 8 (eight) hours as needed for Pain       albuterol (PROVENTIL) 2.5 mg /3 mL (0.083 %) nebulizer solution Take 2.5 mg by nebulization every 6 (six) hours as needed for Wheezing       atorvastatin (LIPITOR) 80 MG tablet Take 80 mg by mouth once daily       cetirizine (ZYRTEC) 10 MG tablet Take 10 mg by mouth once daily       famotidine (PEPCID) 20 MG tablet Take 20 mg by mouth 2 (two) times daily       FUROsemide (LASIX) 20 MG tablet Take 20 mg by mouth once daily       losartan (COZAAR) 25 MG tablet Take 25 mg by mouth once daily       semaglutide (OZEMPIC) 2 mg/dose  (8 mg/3 mL) pen injector Inject 2 mg subcutaneously once a week       tiotropium (SPIRIVA) 18 mcg inhalation capsule Place 18 mcg into inhaler and inhale once daily        No current facility-administered medications on file prior to visit.        Family History       Family History  Problem Relation Age of Onset   Diabetes Mother     High blood pressure (Hypertension) Sister     Diabetes Sister     Hyperlipidemia (Elevated cholesterol) Brother     High blood pressure (Hypertension) Brother     Diabetes Brother          Tobacco Use History  Social History       Tobacco Use  Smoking Status Never  Smokeless Tobacco Never        Social History  Social History        Socioeconomic History   Marital status: Legally Separated  Tobacco Use   Smoking status: Never   Smokeless tobacco: Never  Substance and Sexual Activity   Alcohol use: Never   Drug use: Never    Social Drivers of Acupuncturist Strain: Low Risk  (12/08/2023)    Received from [facility]    Overall Financial Resource Strain (CARDIA)     Difficulty of Paying Living Expenses: Not very hard  Food Insecurity: No Food Insecurity (12/08/2023)    Received from [facility]    Hunger Vital Sign     Worried About Running Out of Food in the Last Year: Never true     Ran Out of Food in the Last Year: Never true  Transportation Needs: No Transportation Needs (12/08/2023)    Received from [facility] - Transportation     Lack of Transportation (Medical): No     Lack of Transportation (Non-Medical): No  Physical Activity: Insufficiently Active (12/08/2023)    Received from [facility]    Exercise Vital Sign     Days of Exercise per Week: 1 day     Minutes of Exercise per Session: 10 min  Stress: No Stress Concern Present (12/08/2023)    Received from [facility] - Occupational Stress Questionnaire     Feeling of Stress : Not at all  Social  Connections: Moderately Isolated (12/08/2023)    Received from [facility]    Social Connection and Isolation Panel [NHANES]     Frequency of Communication with Friends and Family: More than three  times a week     Frequency of Social Gatherings with Friends and Family: More than three times a week     Attends Religious Services: Never     Database administrator or Organizations: Yes     Attends Banker Meetings: Never     Marital Status: Separated  Housing Stability: Unknown (12/18/2023)    Housing Stability Vital Sign     Homeless in the Last Year: No        Objective:         Vitals:    12/18/23 1005  BP: 118/78  Temp: 36.5 C (97.7 F)  SpO2: 95%  Weight: (!) 106.1 kg (234 lb)  Height: 168.9 cm (5' 6.5")  PainSc: 0-No pain    Body mass index is 37.2 kg/m.   Physical Exam    Constitutional:  WDWN in NAD, conversant, no obvious deformities; lying in bed comfortably Eyes:  Pupils equal, round; sclera anicteric; moist conjunctiva; no lid lag HENT:  Oral mucosa moist; good dentition  Neck:  No masses palpated, trachea midline; no thyromegaly Lungs:  CTA bilaterally; normal respiratory effort Breast -right nipple shows a 7 mm protruding cyst on the lateral aspect of the nipple with a pinpoint opening with some milky purulent drainage.  No surrounding cellulitis.  She has a similar appearing mass on the medial aspect of the nipple but there is no drainage. CV:  Regular rate and rhythm; no murmurs; extremities well-perfused with no edema Abd:  +bowel sounds, soft, non-tender, no palpable organomegaly; healed vertical periumbilical incision.  There is a palpable hernia in the epigastrium approximately 5 cm above the umbilicus.  This is reducible when supine.  The fascial defect is approximately 2 cm.  The hernia sac itself feels like it is about 5 cm. Musc: Normal gait; no apparent clubbing or cyanosis in extremities Lymphatic:  No palpable cervical or axillary  lymphadenopathy Skin:  Warm, dry; no sign of jaundice Psychiatric - alert and oriented x 4; calm mood and affect     Labs, Imaging and Diagnostic Testing: CLINICAL DATA:  68 year old female presents with RIGHT nipple wound/soreness. Also for annual bilateral mammogram. History of bloody nipple discharge in the past, but no further episodes of bloody nipple discharge since 2023.   EXAM: DIGITAL DIAGNOSTIC BILATERAL MAMMOGRAM WITH TOMOSYNTHESIS AND CAD; ULTRASOUND RIGHT BREAST LIMITED   TECHNIQUE: Bilateral digital diagnostic mammography and breast tomosynthesis was performed. The images were evaluated with computer-aided detection. ; Targeted ultrasound examination of the right breast was performed   COMPARISON:  Previous exam(s).   ACR Breast Density Category b: There are scattered areas of fibroglandular density.   FINDINGS: Full field views of both breasts and a spot compression view of the RETROAREOLAR RIGHT breast demonstrate no suspicious mass, distortion or worrisome calcifications.   On physical exam, a small superficial wound along the OUTER RIGHT nipple identified.   Targeted ultrasound is performed, showing no sonographic abnormalities within the RETROAREOLAR RIGHT breast.   IMPRESSION: 1. No mammographic or sonographic abnormalities within the RETROAREOLAR RIGHT breast. 2. No mammographic evidence of breast malignancy. 3. Small superficial wound along the OUTER RIGHT nipple. Clinical follow-up is recommended. If this wound persist or increases, punch biopsy may be indicated.   RECOMMENDATION: Recommend clinical follow-up as indicated. Consider punch biopsy of the RIGHT nipple if nipple changes do not improved. Any further workup should be based on clinical grounds.   Otherwise, bilateral screening mammogram in 1 year.   I have discussed  the findings and recommendations with the patient. If applicable, a reminder letter will be sent to the  patient regarding the next appointment.   BI-RADS CATEGORY  1: Negative.     Electronically Signed   By: Harmon Pier M.D.   On: 11/15/2023 11:02   Assessment and Plan:  The patient has a small infected cyst of the nipple that appears to be chronically draining.  She also has an epigastric ventral hernia.  Recommend ventral hernia repair with mesh followed by excision of the right nipple cyst.The surgical procedure has been discussed with the patient.  Potential risks, benefits, alternative treatments, and expected outcomes have been explained.  All of the patient's questions at this time have been answered.  The likelihood of reaching the patient's treatment goal is good.  The patient understands the proposed surgical procedure and wishes to proceed.     Araceli Arango Delbert Harness, MD  12/18/2023 11:41 AM

## 2023-12-18 NOTE — H&P (Signed)
 Subjective    Chief Complaint: New Patient (Lesion of skin breast rt nipple )       History of Present Illness: Krystal Allen is a 68 y.o. female who is seen today as an office consultation at the request of Dr. Laural Benes for evaluation of New Patient (Lesion of skin breast rt nipple ) .   This is a 68 year old female who presents with a 1-month history of a tender area on the lateral side of her right nipple with some milky purulent drainage.  The patient states that previously she just had a small lump on the side of her nipple.  She had a recent mammogram that was unremarkable.  She presents now to discuss excision of this area to prevent recurrence.   The patient has had a previous umbilical hernia repair many years ago performed in [facility].  She has developed a lump in her epigastrium above her previous incision.  This has enlarged and become uncomfortable.  She had a CT scan last year for examination of her abdominal aorta.  Incidental finding on this showed a small epigastric hernia with a 1.5 cm fascial defect.  She denies any GI obstructive symptoms.     Review of Systems: A complete review of systems was obtained from the patient.  I have reviewed this information and discussed as appropriate with the patient.  See HPI as well for other ROS.   Review of Systems  Constitutional: Negative.   HENT: Negative.    Eyes: Negative.   Respiratory: Negative.    Cardiovascular: Negative.   Gastrointestinal: Negative.   Genitourinary: Negative.   Musculoskeletal: Negative.   Skin: Negative.   Neurological: Negative.   Endo/Heme/Allergies: Negative.   Psychiatric/Behavioral: Negative.          Medical History: Past Medical History      Past Medical History:  Diagnosis Date   COPD (chronic obstructive pulmonary disease) (CMS/HHS-HCC)     DVT (deep venous thrombosis) (CMS/HHS-HCC)     Hypertension          Problem List     Patient Active Problem List  Diagnosis    Adenomatous polyp of colon   Anxiety   Aortic atherosclerosis (CMS-HCC)   Bilateral shoulder pain   Centrilobular emphysema (CMS/HHS-HCC)   Cigarette smoker   Chronic hypoxic respiratory failure, on home oxygen therapy (CMS/HHS-HCC)   CKD stage 3b, GFR 30-44 ml/min (CMS/HHS-HCC)   COPD (chronic obstructive pulmonary disease) (CMS/HHS-HCC)   Esophageal reflux   Hyperlipidemia   Hypertension associated with type 2 diabetes mellitus  (CMS/HHS-HCC)   Langerhan's cell histiocytosis (CMS/HHS-HCC)   Morbid obesity (CMS/HHS-HCC)   Obesity (BMI 30-39.9), unspecified   Tobacco abuse   Type 2 diabetes mellitus with chronic kidney disease, without long-term current use of insulin (CMS/HHS-HCC)   Uncomplicated epigastric hernia   Nipple infection        Past Surgical History       Past Surgical History:  Procedure Laterality Date   INTRO CATH TO MAIN PULMONARY ARTERY/RIGHT HEART Right 10/12/2022        Allergies      Allergies  Allergen Reactions   Hygroton [Chlorthalidone] Other (See Comments)   Latex, Natural Rubber Rash   Penicillin Other (See Comments)   Sulfa (Sulfonamide Antibiotics) Other (See Comments)        Medications Ordered Prior to Encounter        Current Outpatient Medications on File Prior to Visit  Medication Sig Dispense Refill   acetaminophen (TYLENOL)  650 MG ER tablet Take 650 mg by mouth every 8 (eight) hours as needed for Pain       albuterol (PROVENTIL) 2.5 mg /3 mL (0.083 %) nebulizer solution Take 2.5 mg by nebulization every 6 (six) hours as needed for Wheezing       atorvastatin (LIPITOR) 80 MG tablet Take 80 mg by mouth once daily       cetirizine (ZYRTEC) 10 MG tablet Take 10 mg by mouth once daily       famotidine (PEPCID) 20 MG tablet Take 20 mg by mouth 2 (two) times daily       FUROsemide (LASIX) 20 MG tablet Take 20 mg by mouth once daily       losartan (COZAAR) 25 MG tablet Take 25 mg by mouth once daily       semaglutide (OZEMPIC) 2 mg/dose  (8 mg/3 mL) pen injector Inject 2 mg subcutaneously once a week       tiotropium (SPIRIVA) 18 mcg inhalation capsule Place 18 mcg into inhaler and inhale once daily        No current facility-administered medications on file prior to visit.        Family History       Family History  Problem Relation Age of Onset   Diabetes Mother     High blood pressure (Hypertension) Sister     Diabetes Sister     Hyperlipidemia (Elevated cholesterol) Brother     High blood pressure (Hypertension) Brother     Diabetes Brother          Tobacco Use History  Social History       Tobacco Use  Smoking Status Never  Smokeless Tobacco Never        Social History  Social History        Socioeconomic History   Marital status: Legally Separated  Tobacco Use   Smoking status: Never   Smokeless tobacco: Never  Substance and Sexual Activity   Alcohol use: Never   Drug use: Never    Social Drivers of Acupuncturist Strain: Low Risk  (12/08/2023)    Received from [facility]    Overall Financial Resource Strain (CARDIA)     Difficulty of Paying Living Expenses: Not very hard  Food Insecurity: No Food Insecurity (12/08/2023)    Received from [facility]    Hunger Vital Sign     Worried About Running Out of Food in the Last Year: Never true     Ran Out of Food in the Last Year: Never true  Transportation Needs: No Transportation Needs (12/08/2023)    Received from [facility] - Transportation     Lack of Transportation (Medical): No     Lack of Transportation (Non-Medical): No  Physical Activity: Insufficiently Active (12/08/2023)    Received from [facility]    Exercise Vital Sign     Days of Exercise per Week: 1 day     Minutes of Exercise per Session: 10 min  Stress: No Stress Concern Present (12/08/2023)    Received from [facility] - Occupational Stress Questionnaire     Feeling of Stress : Not at all  Social  Connections: Moderately Isolated (12/08/2023)    Received from [facility]    Social Connection and Isolation Panel [NHANES]     Frequency of Communication with Friends and Family: More than three  times a week     Frequency of Social Gatherings with Friends and Family: More than three times a week     Attends Religious Services: Never     Database administrator or Organizations: Yes     Attends Banker Meetings: Never     Marital Status: Separated  Housing Stability: Unknown (12/18/2023)    Housing Stability Vital Sign     Homeless in the Last Year: No        Objective:         Vitals:    12/18/23 1005  BP: 118/78  Temp: 36.5 C (97.7 F)  SpO2: 95%  Weight: (!) 106.1 kg (234 lb)  Height: 168.9 cm (5' 6.5")  PainSc: 0-No pain    Body mass index is 37.2 kg/m.   Physical Exam    Constitutional:  WDWN in NAD, conversant, no obvious deformities; lying in bed comfortably Eyes:  Pupils equal, round; sclera anicteric; moist conjunctiva; no lid lag HENT:  Oral mucosa moist; good dentition  Neck:  No masses palpated, trachea midline; no thyromegaly Lungs:  CTA bilaterally; normal respiratory effort Breast -right nipple shows a 7 mm protruding cyst on the lateral aspect of the nipple with a pinpoint opening with some milky purulent drainage.  No surrounding cellulitis.  She has a similar appearing mass on the medial aspect of the nipple but there is no drainage. CV:  Regular rate and rhythm; no murmurs; extremities well-perfused with no edema Abd:  +bowel sounds, soft, non-tender, no palpable organomegaly; healed vertical periumbilical incision.  There is a palpable hernia in the epigastrium approximately 5 cm above the umbilicus.  This is reducible when supine.  The fascial defect is approximately 2 cm.  The hernia sac itself feels like it is about 5 cm. Musc: Normal gait; no apparent clubbing or cyanosis in extremities Lymphatic:  No palpable cervical or axillary  lymphadenopathy Skin:  Warm, dry; no sign of jaundice Psychiatric - alert and oriented x 4; calm mood and affect     Labs, Imaging and Diagnostic Testing: CLINICAL DATA:  68 year old female presents with RIGHT nipple wound/soreness. Also for annual bilateral mammogram. History of bloody nipple discharge in the past, but no further episodes of bloody nipple discharge since 2023.   EXAM: DIGITAL DIAGNOSTIC BILATERAL MAMMOGRAM WITH TOMOSYNTHESIS AND CAD; ULTRASOUND RIGHT BREAST LIMITED   TECHNIQUE: Bilateral digital diagnostic mammography and breast tomosynthesis was performed. The images were evaluated with computer-aided detection. ; Targeted ultrasound examination of the right breast was performed   COMPARISON:  Previous exam(s).   ACR Breast Density Category b: There are scattered areas of fibroglandular density.   FINDINGS: Full field views of both breasts and a spot compression view of the RETROAREOLAR RIGHT breast demonstrate no suspicious mass, distortion or worrisome calcifications.   On physical exam, a small superficial wound along the OUTER RIGHT nipple identified.   Targeted ultrasound is performed, showing no sonographic abnormalities within the RETROAREOLAR RIGHT breast.   IMPRESSION: 1. No mammographic or sonographic abnormalities within the RETROAREOLAR RIGHT breast. 2. No mammographic evidence of breast malignancy. 3. Small superficial wound along the OUTER RIGHT nipple. Clinical follow-up is recommended. If this wound persist or increases, punch biopsy may be indicated.   RECOMMENDATION: Recommend clinical follow-up as indicated. Consider punch biopsy of the RIGHT nipple if nipple changes do not improved. Any further workup should be based on clinical grounds.   Otherwise, bilateral screening mammogram in 1 year.   I have discussed  the findings and recommendations with the patient. If applicable, a reminder letter will be sent to the  patient regarding the next appointment.   BI-RADS CATEGORY  1: Negative.     Electronically Signed   By: Harmon Pier M.D.   On: 11/15/2023 11:02   Assessment and Plan:  The patient has a small infected cyst of the nipple that appears to be chronically draining.  She also has an epigastric ventral hernia.  Recommend ventral hernia repair with mesh followed by excision of the right nipple cyst.The surgical procedure has been discussed with the patient.  Potential risks, benefits, alternative treatments, and expected outcomes have been explained.  All of the patient's questions at this time have been answered.  The likelihood of reaching the patient's treatment goal is good.  The patient understands the proposed surgical procedure and wishes to proceed.     Araceli Arango Delbert Harness, MD  12/18/2023 11:41 AM

## 2023-12-21 ENCOUNTER — Ambulatory Visit (HOSPITAL_BASED_OUTPATIENT_CLINIC_OR_DEPARTMENT_OTHER): Payer: 59 | Admitting: Pulmonary Disease

## 2023-12-25 DIAGNOSIS — R0602 Shortness of breath: Secondary | ICD-10-CM | POA: Diagnosis not present

## 2023-12-25 NOTE — Progress Notes (Signed)
 Patient wears o2 Prn and at night-per guidelines not a candidate for [facility]. LVM with Tonya at Dr. Fatima Sanger office

## 2023-12-27 NOTE — Progress Notes (Signed)
 Surgical Instructions   Your procedure is scheduled on Wednesday January 03, 2024. Report to [facility] Main Entrance "A" at 1 PM., then check in with the Admitting office. Any questions or running late day of surgery: call [phone]  Questions prior to your surgery date: call [phone], Monday-Friday, 8am-4pm. If you experience any cold or flu symptoms such as cough, fever, chills, shortness of breath, etc. between now and your scheduled surgery, please notify us at the above number.     Remember:  Do not eat after midnight the night before your surgery   You may drink clear liquids until Noon the day of your surgery.   Clear liquids allowed are: Water, Non-Citrus Juices (without pulp), Carbonated Beverages, Clear Tea (no milk, honey, etc.), Black Coffee Only (NO MILK, CREAM OR POWDERED CREAMER of any kind), and Gatorade.  Patient Instructions  The night before surgery:  No food after midnight. ONLY clear liquids after midnight  The day of surgery (if you have diabetes): Drink ONE (1) 12 oz G2 given to you in your pre admission testing appointment by Noon the day of surgery. Drink in one sitting. Do not sip.  This drink was given to you during your hospital  pre-op appointment visit.  Nothing else to drink after completing the  12 oz bottle of G2.         If you have questions, please contact your surgeon's office.     Take these medicines the morning of surgery with A SIP OF WATER  atorvastatin (LIPITOR)  cetirizine (ZYRTEC)  ezetimibe (ZETIA)  fluticasone furoate-vilanterol (BREO ELLIPTA) 200-25 MCG/ACT AEPB  omeprazole (PRILOSEC)  Tiotropium Bromide Monohydrate (SPIRIVA RESPIMAT) 2.5 MCG/ACT AERS    May take these medicines IF NEEDED: acetaminophen (TYLENOL 8 HOUR)  albuterol (PROAIR HFA) 108 (90 Base) MCG/ACT inhaler. Please bring the inhaler with you to the hospital.   albuterol (PROVENTIL) (2.5 MG/3ML) 0.083% nebulizer solution  famotidine (PEPCID)   fexofenadine (ALLEGRA ALLERGY)  fluticasone (FLONASE)  Olopatadine HCl 0.2 % SOLN    Follow your surgeon's instructions on when to stop Asprin.  If no instructions were given by your surgeon then you will need to call the office to get those instructions.     One week prior to surgery, STOP taking any Aleve, Naproxen, Ibuprofen, Motrin, Advil, Goody's, BC's, all herbal medications, fish oil, and non-prescription vitamins.    WHAT DO I DO ABOUT MY DIABETES MEDICATION?   Do not take oral diabetes medicines (pills) the morning of surgery.        DO NOT TAKE YOUR empagliflozin (JARDIANCE) 72 HOURS PRIOR TO SURGERY, WITH THE LAST DOSE BEING 12/30/2023.          DO NOT TAKE YOUR Semaglutide FOR 7 DAYS PRIOR TO SURGERY, WITH THE LAST DOSE BEING NO LATER THAN 12/26/2023.    The day of surgery, do not take other diabetes injectables, including Byetta (exenatide), Bydureon (exenatide ER), Victoza (liraglutide), or Trulicity (dulaglutide).  If your CBG is greater than 220 mg/dL, you may take  of your sliding scale (correction) dose of insulin.   HOW TO MANAGE YOUR DIABETES BEFORE AND AFTER SURGERY  Why is it important to control my blood sugar before and after surgery? Improving blood sugar levels before and after surgery helps healing and can limit problems. A way of improving blood sugar control is eating a healthy diet by:  Eating less sugar and carbohydrates  Increasing activity/exercise  Talking with your doctor about reaching your blood sugar goals  High blood sugars (greater than 180 mg/dL) can raise your risk of infections and slow your recovery, so you will need to focus on controlling your diabetes during the weeks before surgery. Make sure that the doctor who takes care of your diabetes knows about your planned surgery including the date and location.  How do I manage my blood sugar before surgery? Check your blood sugar at least 4 times a day, starting 2 days before surgery,  to make sure that the level is not too high or low.  Check your blood sugar the morning of your surgery when you wake up and every 2 hours until you get to the Short Stay unit.  If your blood sugar is less than 70 mg/dL, you will need to treat for low blood sugar: Do not take insulin. Treat a low blood sugar (less than 70 mg/dL) with  cup of clear juice (cranberry or apple), 4 glucose tablets, OR glucose gel. Recheck blood sugar in 15 minutes after treatment (to make sure it is greater than 70 mg/dL). If your blood sugar is not greater than 70 mg/dL on recheck, call [phone] for further instructions. Report your blood sugar to the short stay nurse when you get to Short Stay.  If you are admitted to the hospital after surgery: Your blood sugar will be checked by the staff and you will probably be given insulin after surgery (instead of oral diabetes medicines) to make sure you have good blood sugar levels. The goal for blood sugar control after surgery is 80-180 mg/dL.                      Do NOT Smoke (Tobacco/Vaping) for 24 hours prior to your procedure.  If you use a CPAP at night, you may bring your mask/headgear for your overnight stay.   You will be asked to remove any contacts, glasses, piercing's, hearing aid's, dentures/partials prior to surgery. Please bring cases for these items if needed.    Patients discharged the day of surgery will not be allowed to drive home, and someone needs to stay with them for 24 hours.  SURGICAL WAITING ROOM VISITATION Patients may have no more than 2 support people in the waiting area - these visitors may rotate.   Pre-op nurse will coordinate an appropriate time for 1 ADULT support person, who may not rotate, to accompany patient in pre-op.  Children under the age of 67 must have an adult with them who is not the patient and must remain in the main waiting area with an adult.  If the patient needs to stay at the hospital during part of their  recovery, the visitor guidelines for inpatient rooms apply.  Please refer to the [facility] website for the visitor guidelines for any additional information.   If you received a COVID test during your pre-op visit  it is requested that you wear a mask when out in public, stay away from anyone that may not be feeling well and notify your surgeon if you develop symptoms. If you have been in contact with anyone that has tested positive in the last 10 days please notify you surgeon.      Pre-operative CHG Bathing Instructions   You can play a key role in reducing the risk of infection after surgery. Your skin needs to be as free of germs as possible. You can reduce the number of germs on your skin by washing with CHG (chlorhexidine gluconate) soap before surgery.  CHG is an antiseptic soap that kills germs and continues to kill germs even after washing.   DO NOT use if you have an allergy to chlorhexidine/CHG or antibacterial soaps. If your skin becomes reddened or irritated, stop using the CHG and notify one of our RNs at [phone].              TAKE A SHOWER THE NIGHT BEFORE SURGERY AND THE DAY OF SURGERY    Please keep in mind the following:  DO NOT shave, including legs and underarms, 48 hours prior to surgery.   You may shave your face before/day of surgery.  Place clean sheets on your bed the night before surgery Use a clean washcloth (not used since being washed) for each shower. DO NOT sleep with pet's night before surgery.  CHG Shower Instructions:  Wash your face and private area with normal soap. If you choose to wash your hair, wash first with your normal shampoo.  After you use shampoo/soap, rinse your hair and body thoroughly to remove shampoo/soap residue.  Turn the water OFF and apply half the bottle of CHG soap to a CLEAN washcloth.  Apply CHG soap ONLY FROM YOUR NECK DOWN TO YOUR TOES (washing for 3-5 minutes)  DO NOT use CHG soap on face, private areas, open wounds,  or sores.  Pay special attention to the area where your surgery is being performed.  If you are having back surgery, having someone wash your back for you may be helpful. Wait 2 minutes after CHG soap is applied, then you may rinse off the CHG soap.  Pat dry with a clean towel  Put on clean pajamas    Additional instructions for the day of surgery: DO NOT APPLY any lotions, deodorants or perfumes.   Do not wear jewelry or makeup Do not wear nail polish, gel polish, artificial nails, or any other type of covering on natural nails (fingers and toes) Do not bring valuables to the hospital. [facility] is not responsible for valuables/personal belongings. Put on clean/comfortable clothes.  Please brush your teeth.  Ask your nurse before applying any prescription medications to the skin.

## 2023-12-28 ENCOUNTER — Encounter (HOSPITAL_COMMUNITY): Payer: Self-pay

## 2023-12-28 ENCOUNTER — Other Ambulatory Visit: Payer: Self-pay

## 2023-12-28 ENCOUNTER — Encounter (HOSPITAL_COMMUNITY)
Admission: RE | Admit: 2023-12-28 | Discharge: 2023-12-28 | Disposition: A | Discharge status: Home / Self Care | Payer: 59 | Source: Ambulatory Visit | Attending: Surgery | Admitting: Surgery

## 2023-12-28 VITALS — BP 97/69 | HR 64 | Temp 98.3°F | Resp 17 | Ht 66.0 in | Wt 231.6 lb

## 2023-12-28 DIAGNOSIS — J9611 Chronic respiratory failure with hypoxia: Secondary | ICD-10-CM | POA: Insufficient documentation

## 2023-12-28 DIAGNOSIS — J439 Emphysema, unspecified: Secondary | ICD-10-CM | POA: Insufficient documentation

## 2023-12-28 DIAGNOSIS — I080 Rheumatic disorders of both mitral and aortic valves: Secondary | ICD-10-CM | POA: Insufficient documentation

## 2023-12-28 DIAGNOSIS — Z87891 Personal history of nicotine dependence: Secondary | ICD-10-CM | POA: Insufficient documentation

## 2023-12-28 DIAGNOSIS — Z9981 Dependence on supplemental oxygen: Secondary | ICD-10-CM | POA: Diagnosis not present

## 2023-12-28 DIAGNOSIS — Z01818 Encounter for other preprocedural examination: Secondary | ICD-10-CM

## 2023-12-28 DIAGNOSIS — Z7985 Long-term (current) use of injectable non-insulin antidiabetic drugs: Secondary | ICD-10-CM | POA: Insufficient documentation

## 2023-12-28 DIAGNOSIS — I10 Essential (primary) hypertension: Secondary | ICD-10-CM | POA: Insufficient documentation

## 2023-12-28 DIAGNOSIS — Z01812 Encounter for preprocedural laboratory examination: Secondary | ICD-10-CM | POA: Insufficient documentation

## 2023-12-28 DIAGNOSIS — N1832 Chronic kidney disease, stage 3b: Secondary | ICD-10-CM | POA: Diagnosis not present

## 2023-12-28 DIAGNOSIS — E1122 Type 2 diabetes mellitus with diabetic chronic kidney disease: Secondary | ICD-10-CM | POA: Diagnosis not present

## 2023-12-28 HISTORY — DX: Dyspnea, unspecified: R06.00

## 2023-12-28 HISTORY — DX: Type 2 diabetes mellitus without complications: E11.9

## 2023-12-28 LAB — CBC
HCT: 41.4 % (ref 36.0–46.0)
Hemoglobin: 13.3 g/dL (ref 12.0–15.0)
MCH: 27.9 pg (ref 26.0–34.0)
MCHC: 32.1 g/dL (ref 30.0–36.0)
MCV: 87 fL (ref 80.0–100.0)
Platelets: 272 10*3/uL (ref 150–400)
RBC: 4.76 MIL/uL (ref 3.87–5.11)
RDW: 18.9 % — ABNORMAL HIGH (ref 11.5–15.5)
WBC: 5.6 10*3/uL (ref 4.0–10.5)
nRBC: 0 % (ref 0.0–0.2)

## 2023-12-28 LAB — BASIC METABOLIC PANEL
Anion gap: 10 (ref 5–15)
BUN: 19 mg/dL (ref 8–23)
CO2: 27 mmol/L (ref 22–32)
Calcium: 8.7 mg/dL — ABNORMAL LOW (ref 8.9–10.3)
Chloride: 103 mmol/L (ref 98–111)
Creatinine, Ser: 1.46 mg/dL — ABNORMAL HIGH (ref 0.44–1.00)
GFR, Estimated: 39 mL/min — ABNORMAL LOW (ref >60)
Glucose, Bld: 85 mg/dL (ref 70–99)
Potassium: 3 mmol/L — ABNORMAL LOW (ref 3.5–5.1)
Sodium: 140 mmol/L (ref 135–145)

## 2023-12-28 LAB — HEMOGLOBIN A1C
Hgb A1c MFr Bld: 5.7 % — ABNORMAL HIGH (ref 4.8–5.6)
Mean Plasma Glucose: 116.89 mg/dL

## 2023-12-28 LAB — GLUCOSE, CAPILLARY: Glucose-Capillary: 94 mg/dL (ref 70–99)

## 2023-12-28 NOTE — Progress Notes (Signed)
 Notified Wendy at Dr. Fatima Sanger office patient's potassium.

## 2023-12-28 NOTE — Progress Notes (Signed)
 PCP - Jonah Blue Cardiologist - Dr. Alverda Skeans Pulmonologist - Dr. Irma Newness  PPM/ICD - denies   Chest x-ray - 02/06/23 EKG - 02/24/23 Stress Test - denies ECHO - 04/01/22 Cardiac Cath -10/12/22   Sleep Study - denies - pt does state that she is supposed to wear oxygen at night but only wears it if she is sick  CPAP - n/a  Fasting Blood Sugar - 94 Checks Blood Sugar __1___ times a day when not wearing Libre.  Patient is currently not wearing her Josephine Igo but states that she will put it back on this week.    Last dose of GLP1 agonist-  last dose of Ozempic - 2/15 GLP1 instructions: patient aware not to take Ozempic prior to surgery  Blood Thinner Instructions: n/a Aspirin Instructions:  patient had not been given instructions on when to stop Aspirin.  While patient was at her PAT appointment, Dr. Fatima Sanger office was called and patient was Tresa Endo stated that Aspirin should be held for 5 days prior to procedure.  Patient is aware that last dose will be on Friday, 2/28.    ERAS Protcol - clears until 1200 PRE-SURGERY Ensure or G2-  G2 as ordered  COVID TEST- n/a   Anesthesia review: yes - due to oxygen use at night  Patient denies shortness of breath, fever, cough and chest pain at PAT appointment   All instructions explained to the patient, with a verbal understanding of the material. Patient agrees to go over the instructions while at home for a better understanding. Patient also instructed to self quarantine after being tested for COVID-19. The opportunity to ask questions was provided.

## 2023-12-29 NOTE — Progress Notes (Signed)
 Anesthesia Chart Review:  68 year old female former smoker (quit 2022) follows with pulmonology for history of emphysema, chronic hypoxic respiratory failure on nocturnal O2, pulmonary Langerhans' cell histiocytosis.  She is maintained on Breo, Spiriva, on as needed albuterol.  Recent PFTs 1118 showed improving DLCO. FVC 2.88 (79%) FEV1 2.25 (81%) Ratio 78  TLC 100% DLCO 77% Interpretation: No obstructive or restrictive defect. Improved DLCO to 77%.  There was a consideration that she may no longer require nocturnal oxygen; new ON on room air has been ordered but not yet completed.  Follows with cardiology for history of HTN, HLD, chronic DOE.  Right heart cath 09/2022 showed relatively normal filling pressures, dyspnea felt unrelated.  Echo 03/2022 showed EF 60 to 65%, grade 1 DD, mild MR.  Coronary CT 03/2022 showed nonobstructive disease.  Other pertinent history includes GERD, CKD 2-3, non-insulin-dependent DM2.  Patient reports last dose of Ozempic 12/16/2023.  Preop labs reviewed, mild hypokalemia potassium 3.0, creatinine mildly elevated 1.46 (peers to be near baseline), otherwise unremarkable.  Diabetes well-controlled with A1c 5.7.  Patient has not had EKG in the past 12 months, will need one day of surgery.  EKG 10/12/2022: Normal sinus rhythm.  Rate 76. Possible Inferior infarct , age undetermined. P wave morphology suggest biatrial enlargement  RHC 10/12/2022: 1.  Relatively normal filling pressures with a mean RA pressure of 5 mmHg, RV pressure of 33/-2 with RV end-diastolic pressure of 4 mmHg, wedge pressure of 7 mmHg, and PA pressure of 37/15 with a mean of 23 mmHg; the Fick cardiac output using a noninvasive oxygen saturation was 5.2 L/min and cardiac index was 2.3 L/min/m.   Recommendation: Dyspnea seems to be not related to elevated filling pressures.  TTE 04/01/2022:  1. Left ventricular ejection fraction, by estimation, is 60 to 65%. Left  ventricular ejection fraction by 3D  volume is 66 %. The left ventricle has  normal function. The left ventricle has no regional wall motion  abnormalities. Left ventricular diastolic   parameters are consistent with Grade I diastolic dysfunction (impaired  relaxation).   2. Right ventricular systolic function is normal. The right ventricular  size is normal.   3. The mitral valve is normal in structure. Mild mitral valve  regurgitation. No evidence of mitral stenosis.   4. The aortic valve is calcified. There is mild calcification of the  aortic valve. There is mild thickening of the aortic valve. Aortic valve  regurgitation is not visualized. Aortic valve sclerosis/calcification is  present, without any evidence of  aortic stenosis.   5. The inferior vena cava is normal in size with greater than 50%  respiratory variability, suggesting right atrial pressure of 3 mmHg.   Coronary CTA with FFR analysis 03/31/2022: FINDINGS: 1. Left Main: 0.99; low likelihood of hemodynamic significance.   2. Prox LAD: 0.98; low likelihood of hemodynamic significance. 3. Mid LAD: 0.95; LCX: low likelihood of hemodynamic significance. 4. Distal LAD: 0.88; low likelihood of hemodynamic significance. 5. LCX: 0.99; Low likelihood of hemodynamic significance 6. RCA: 0.95; low likelihood of hemodynamic significance.   IMPRESSION:   1.  Mid LAD negative by CT FFR.     Zannie Cove [facility] Short Stay Center/Anesthesiology Phone [phone] 12/29/2023 3:53 PM

## 2023-12-29 NOTE — Anesthesia Preprocedure Evaluation (Addendum)
 Anesthesia Evaluation  Patient identified by MRN, date of birth, ID band Patient awake    Reviewed: Allergy & Precautions, NPO status , Patient's Chart, lab work & pertinent test results, reviewed documented beta blocker date and time   History of Anesthesia Complications Negative for: history of anesthetic complications  Airway Mallampati: II  TM Distance: >3 FB     Dental  (+) Dental Advisory Given, Edentulous Upper   Pulmonary shortness of breath and Long-Term Oxygen Therapy, neg sleep apnea, COPD,  COPD inhaler, former smoker   breath sounds clear to auscultation       Cardiovascular hypertension, Pt. on medications (-) CAD, (-) Past MI, (-) Cardiac Stents, (-) CABG and (-) Peripheral Vascular Disease + Valvular Problems/Murmurs MR  Rhythm:Regular Rate:Normal  Echo 03/2022 showed EF 60 to 65%, grade 1 DD, mild MR.   Neuro/Psych neg Seizures PSYCHIATRIC DISORDERS Anxiety Depression       GI/Hepatic ,GERD  Medicated and Controlled,,(+) neg Cirrhosis        Endo/Other  diabetes, Type 2    Renal/GU Renal InsufficiencyRenal diseaseLab Results      Component                Value               Date                              K                        3.0 (L)             12/28/2023                   CREATININE               1.46 (H)            12/28/2023                GFRNONAA                 39 (L)              12/28/2023                    Musculoskeletal  (+) Arthritis ,    Abdominal  (+) + obese (BMI 37.4)  Peds  Hematology Lab Results      Component                Value               Date                       HGB                      13.3                12/28/2023                         PLT                      272                 12/28/2023  Anesthesia Other Findings All: sulfa pcn latex  Reproductive/Obstetrics                             Anesthesia  Physical Anesthesia Plan  ASA: 3  Anesthesia Plan: General   Post-op Pain Management:    Induction: Intravenous  PONV Risk Score and Plan: Treatment may vary due to age or medical condition, Midazolam and Ondansetron  Airway Management Planned: Oral ETT  Additional Equipment:   Intra-op Plan:   Post-operative Plan: Extubation in OR  Informed Consent:      Dental advisory given  Plan Discussed with:   Anesthesia Plan Comments: (PAT note by Antionette Poles, PA-C: 68 year old female former smoker (quit 2022) follows with pulmonology for history of emphysema, chronic hypoxic respiratory failure on nocturnal O2, pulmonary Langerhans' cell histiocytosis.  She is maintained on Breo, Spiriva, on as needed albuterol.  Recent PFTs 1118 showed improving DLCO. FVC 2.88 (79%) FEV1 2.25 (81%) Ratio 78  TLC 100% DLCO 77% Interpretation: No obstructive or restrictive defect. Improved DLCO to 77%.  There was a consideration that she may no longer require nocturnal oxygen; new ON on room air has been ordered but not yet completed.  Follows with cardiology for history of HTN, HLD, chronic DOE.  Right heart cath 09/2022 showed relatively normal filling pressures, dyspnea felt unrelated.  Echo 03/2022 showed EF 60 to 65%, grade 1 DD, mild MR.  Coronary CT 03/2022 showed nonobstructive disease.  Other pertinent history includes GERD, CKD 2-3, non-insulin-dependent DM2.  Patient reports last dose of Ozempic 12/16/2023.  Preop labs reviewed, mild hypokalemia potassium 3.0, creatinine mildly elevated 1.46 (peers to be near baseline), otherwise unremarkable.  Diabetes well-controlled with A1c 5.7.  Patient has not had EKG in the past 12 months, will need one day of surgery.  EKG 10/12/2022: Normal sinus rhythm.  Rate 76. Possible Inferior infarct , age undetermined. P wave morphology suggest biatrial enlargement  RHC 10/12/2022: 1.  Relatively normal filling pressures with a mean RA pressure of 5 mmHg, RV  pressure of 33/-2 with RV end-diastolic pressure of 4 mmHg, wedge pressure of 7 mmHg, and PA pressure of 37/15 with a mean of 23 mmHg; the Fick cardiac output using a noninvasive oxygen saturation was 5.2 L/min and cardiac index was 2.3 L/min/m.  Recommendation: Dyspnea seems to be not related to elevated filling pressures.  TTE 04/01/2022: 1. Left ventricular ejection fraction, by estimation, is 60 to 65%. Left  ventricular ejection fraction by 3D volume is 66 %. The left ventricle has  normal function. The left ventricle has no regional wall motion  abnormalities. Left ventricular diastolic  parameters are consistent with Grade I diastolic dysfunction (impaired  relaxation).  2. Right ventricular systolic function is normal. The right ventricular  size is normal.  3. The mitral valve is normal in structure. Mild mitral valve  regurgitation. No evidence of mitral stenosis.  4. The aortic valve is calcified. There is mild calcification of the  aortic valve. There is mild thickening of the aortic valve. Aortic valve  regurgitation is not visualized. Aortic valve sclerosis/calcification is  present, without any evidence of  aortic stenosis.  5. The inferior vena cava is normal in size with greater than 50%  respiratory variability, suggesting right atrial pressure of 3 mmHg.   Coronary CTA with FFR analysis 03/31/2022: FINDINGS: 1. Left Main: 0.99; low likelihood of hemodynamic significance.  2. Prox LAD: 0.98; low likelihood of hemodynamic significance.  3. Mid LAD: 0.95; LCX: low likelihood of hemodynamic significance. 4. Distal LAD: 0.88; low likelihood of hemodynamic significance. 5. LCX: 0.99; Low likelihood of hemodynamic significance 6. RCA: 0.95; low likelihood of hemodynamic significance.  IMPRESSION:  1.  Mid LAD negative by CT FFR.      )        Anesthesia Quick Evaluation

## 2024-01-01 ENCOUNTER — Other Ambulatory Visit: Payer: Self-pay | Admitting: Internal Medicine

## 2024-01-01 ENCOUNTER — Other Ambulatory Visit (HOSPITAL_COMMUNITY): Payer: Self-pay

## 2024-01-01 ENCOUNTER — Other Ambulatory Visit: Payer: Self-pay

## 2024-01-02 ENCOUNTER — Other Ambulatory Visit: Payer: Self-pay

## 2024-01-02 NOTE — Progress Notes (Signed)
 Spoke with the pt, she will arrive tom at 1330. NPO post midnight and she will stop clear liquids at 1230.

## 2024-01-03 ENCOUNTER — Ambulatory Visit (HOSPITAL_COMMUNITY): Payer: Self-pay | Admitting: Physician Assistant

## 2024-01-03 ENCOUNTER — Ambulatory Visit (HOSPITAL_BASED_OUTPATIENT_CLINIC_OR_DEPARTMENT_OTHER): Admitting: Anesthesiology

## 2024-01-03 ENCOUNTER — Encounter (HOSPITAL_COMMUNITY)
Admission: RE | Disposition: A | Discharge status: Home / Self Care | Payer: Self-pay | Source: Ambulatory Visit | Attending: Surgery

## 2024-01-03 ENCOUNTER — Encounter (HOSPITAL_COMMUNITY): Payer: Self-pay | Admitting: Surgery

## 2024-01-03 ENCOUNTER — Other Ambulatory Visit (HOSPITAL_COMMUNITY): Payer: Self-pay

## 2024-01-03 ENCOUNTER — Other Ambulatory Visit: Payer: Self-pay | Admitting: Physician Assistant

## 2024-01-03 ENCOUNTER — Other Ambulatory Visit: Payer: Self-pay

## 2024-01-03 ENCOUNTER — Ambulatory Visit (HOSPITAL_COMMUNITY)
Admission: RE | Admit: 2024-01-03 | Discharge: 2024-01-03 | Disposition: A | Discharge status: Home / Self Care | Payer: 59 | Source: Ambulatory Visit | Attending: Surgery | Admitting: Surgery

## 2024-01-03 DIAGNOSIS — K219 Gastro-esophageal reflux disease without esophagitis: Secondary | ICD-10-CM | POA: Insufficient documentation

## 2024-01-03 DIAGNOSIS — N6001 Solitary cyst of right breast: Secondary | ICD-10-CM | POA: Insufficient documentation

## 2024-01-03 DIAGNOSIS — I10 Essential (primary) hypertension: Secondary | ICD-10-CM | POA: Insufficient documentation

## 2024-01-03 DIAGNOSIS — I129 Hypertensive chronic kidney disease with stage 1 through stage 4 chronic kidney disease, or unspecified chronic kidney disease: Secondary | ICD-10-CM | POA: Diagnosis not present

## 2024-01-03 DIAGNOSIS — Z87891 Personal history of nicotine dependence: Secondary | ICD-10-CM | POA: Insufficient documentation

## 2024-01-03 DIAGNOSIS — S31109A Unspecified open wound of abdominal wall, unspecified quadrant without penetration into peritoneal cavity, initial encounter: Secondary | ICD-10-CM | POA: Diagnosis not present

## 2024-01-03 DIAGNOSIS — N1832 Chronic kidney disease, stage 3b: Secondary | ICD-10-CM | POA: Diagnosis not present

## 2024-01-03 DIAGNOSIS — L723 Sebaceous cyst: Secondary | ICD-10-CM | POA: Diagnosis not present

## 2024-01-03 DIAGNOSIS — E119 Type 2 diabetes mellitus without complications: Secondary | ICD-10-CM | POA: Diagnosis not present

## 2024-01-03 DIAGNOSIS — J449 Chronic obstructive pulmonary disease, unspecified: Secondary | ICD-10-CM

## 2024-01-03 DIAGNOSIS — K439 Ventral hernia without obstruction or gangrene: Secondary | ICD-10-CM | POA: Diagnosis not present

## 2024-01-03 DIAGNOSIS — E1122 Type 2 diabetes mellitus with diabetic chronic kidney disease: Secondary | ICD-10-CM | POA: Diagnosis not present

## 2024-01-03 DIAGNOSIS — Z01818 Encounter for other preprocedural examination: Secondary | ICD-10-CM

## 2024-01-03 DIAGNOSIS — R222 Localized swelling, mass and lump, trunk: Secondary | ICD-10-CM | POA: Diagnosis not present

## 2024-01-03 DIAGNOSIS — L720 Epidermal cyst: Secondary | ICD-10-CM | POA: Diagnosis not present

## 2024-01-03 DIAGNOSIS — J302 Other seasonal allergic rhinitis: Secondary | ICD-10-CM

## 2024-01-03 HISTORY — PX: BREAST CYST EXCISION: SHX579

## 2024-01-03 HISTORY — PX: EPIGASTRIC HERNIA REPAIR: SHX404

## 2024-01-03 LAB — GLUCOSE, CAPILLARY
Glucose-Capillary: 70 mg/dL (ref 70–99)
Glucose-Capillary: 66 mg/dL — ABNORMAL LOW (ref 70–99)
Glucose-Capillary: 92 mg/dL (ref 70–99)

## 2024-01-03 SURGERY — REPAIR, HERNIA, EPIGASTRIC, ADULT
Anesthesia: General | Site: Breast | Laterality: Right

## 2024-01-03 MED ORDER — ONDANSETRON HCL 4 MG/2ML IJ SOLN
INTRAMUSCULAR | Status: DC | PRN
Start: 2024-01-03 — End: 2024-01-03
  Administered 2024-01-03: 4 mg via INTRAVENOUS

## 2024-01-03 MED ORDER — BUPIVACAINE-EPINEPHRINE 0.25% -1:200000 IJ SOLN
INTRAMUSCULAR | Status: DC | PRN
  Administered 2024-01-03: 14 mL

## 2024-01-03 MED ORDER — LIDOCAINE 2% (20 MG/ML) 5 ML SYRINGE
INTRAMUSCULAR | Status: AC
  Filled 2024-01-03: qty 5

## 2024-01-03 MED ORDER — PROPOFOL 10 MG/ML IV BOLUS
INTRAVENOUS | Status: DC | PRN
Start: 2024-01-03 — End: 2024-01-03
  Administered 2024-01-03: 120 mg via INTRAVENOUS

## 2024-01-03 MED ORDER — SUGAMMADEX SODIUM 200 MG/2ML IV SOLN
INTRAVENOUS | Status: DC | PRN
  Administered 2024-01-03: 200 mg via INTRAVENOUS

## 2024-01-03 MED ORDER — INSULIN ASPART 100 UNIT/ML IJ SOLN: 0.0000 [IU] | INTRAMUSCULAR | Status: DC | PRN

## 2024-01-03 MED ORDER — OXYCODONE HCL 5 MG PO TABS
5.0000 mg | ORAL_TABLET | Freq: Four times a day (QID) | ORAL | 0 refills | Status: AC | PRN
Start: 2024-01-03 — End: ?
  Filled 2024-01-03: qty 20, 5d supply, fill #0

## 2024-01-03 MED ORDER — ACETAMINOPHEN 10 MG/ML IV SOLN: 1000.0000 mg | Freq: Once | INTRAVENOUS | Status: DC | PRN

## 2024-01-03 MED ORDER — BUPIVACAINE-EPINEPHRINE (PF) 0.25% -1:200000 IJ SOLN
INTRAMUSCULAR | Status: AC
  Filled 2024-01-03: qty 30

## 2024-01-03 MED ORDER — LIDOCAINE 2% (20 MG/ML) 5 ML SYRINGE
INTRAMUSCULAR | Status: DC | PRN
  Administered 2024-01-03: 100 mg via INTRAVENOUS

## 2024-01-03 MED ORDER — ACETAMINOPHEN 500 MG PO TABS
1000.0000 mg | ORAL_TABLET | ORAL | Status: AC
  Administered 2024-01-03: 1000 mg via ORAL
  Filled 2024-01-03: qty 2

## 2024-01-03 MED ORDER — OXYCODONE HCL 5 MG PO TABS: 5.0000 mg | ORAL_TABLET | Freq: Once | ORAL | Status: AC | PRN

## 2024-01-03 MED ORDER — OXYCODONE HCL 5 MG/5ML PO SOLN
ORAL | Status: AC
  Filled 2024-01-03: qty 5

## 2024-01-03 MED ORDER — PROPOFOL 10 MG/ML IV BOLUS
INTRAVENOUS | Status: AC
  Filled 2024-01-03: qty 20

## 2024-01-03 MED ORDER — DEXAMETHASONE SODIUM PHOSPHATE 10 MG/ML IJ SOLN
INTRAMUSCULAR | Status: AC
  Filled 2024-01-03: qty 1

## 2024-01-03 MED ORDER — DEXAMETHASONE SODIUM PHOSPHATE 10 MG/ML IJ SOLN
INTRAMUSCULAR | Status: DC | PRN
  Administered 2024-01-03: 5 mg via INTRAVENOUS

## 2024-01-03 MED ORDER — FENTANYL CITRATE (PF) 250 MCG/5ML IJ SOLN
INTRAMUSCULAR | Status: DC | PRN
  Administered 2024-01-03: 100 ug via INTRAVENOUS
  Administered 2024-01-03 (×2): 50 ug via INTRAVENOUS

## 2024-01-03 MED ORDER — MIDAZOLAM HCL 2 MG/2ML IJ SOLN
INTRAMUSCULAR | Status: DC | PRN
  Administered 2024-01-03: 2 mg via INTRAVENOUS

## 2024-01-03 MED ORDER — ONDANSETRON HCL 4 MG/2ML IJ SOLN: 4.0000 mg | Freq: Once | INTRAMUSCULAR | Status: DC | PRN

## 2024-01-03 MED ORDER — FENTANYL CITRATE (PF) 100 MCG/2ML IJ SOLN
INTRAMUSCULAR | Status: DC
Start: 2024-01-03 — End: 2024-01-03
  Filled 2024-01-03: qty 2

## 2024-01-03 MED ORDER — LACTATED RINGERS IV SOLN
INTRAVENOUS | Status: DC
Start: 2024-01-03 — End: 2024-01-03

## 2024-01-03 MED ORDER — CHLORHEXIDINE GLUCONATE CLOTH 2 % EX PADS: 6.0000 | MEDICATED_PAD | Freq: Once | CUTANEOUS | Status: DC

## 2024-01-03 MED ORDER — ONDANSETRON HCL 4 MG/2ML IJ SOLN
INTRAMUSCULAR | Status: AC
  Filled 2024-01-03: qty 2

## 2024-01-03 MED ORDER — CEFAZOLIN SODIUM-DEXTROSE 2-4 GM/100ML-% IV SOLN
2.0000 g | INTRAVENOUS | Status: AC
  Administered 2024-01-03: 2 g via INTRAVENOUS
  Filled 2024-01-03: qty 100

## 2024-01-03 MED ORDER — FENTANYL CITRATE (PF) 100 MCG/2ML IJ SOLN
25.0000 ug | INTRAMUSCULAR | Status: DC | PRN
  Administered 2024-01-03: 25 ug via INTRAVENOUS
  Administered 2024-01-03: 50 ug via INTRAVENOUS

## 2024-01-03 MED ORDER — CHLORHEXIDINE GLUCONATE CLOTH 2 % EX PADS
6.0000 | MEDICATED_PAD | Freq: Once | CUTANEOUS | Status: DC
Start: 2024-01-03 — End: 2024-01-03

## 2024-01-03 MED ORDER — PHENYLEPHRINE 80 MCG/ML (10ML) SYRINGE FOR IV PUSH (FOR BLOOD PRESSURE SUPPORT)
PREFILLED_SYRINGE | INTRAVENOUS | Status: DC | PRN
  Administered 2024-01-03 (×2): 80 ug via INTRAVENOUS

## 2024-01-03 MED ORDER — FENTANYL CITRATE (PF) 250 MCG/5ML IJ SOLN
INTRAMUSCULAR | Status: AC
  Filled 2024-01-03: qty 5

## 2024-01-03 MED ORDER — DEXTROSE 50 % IV SOLN
12.5000 g | INTRAVENOUS | Status: AC
  Filled 2024-01-03: qty 50

## 2024-01-03 MED ORDER — ROCURONIUM BROMIDE 10 MG/ML (PF) SYRINGE
PREFILLED_SYRINGE | INTRAVENOUS | Status: DC | PRN
  Administered 2024-01-03: 60 mg via INTRAVENOUS

## 2024-01-03 MED ORDER — ORAL CARE MOUTH RINSE: 15.0000 mL | Freq: Once | OROMUCOSAL | Status: AC

## 2024-01-03 MED ORDER — OXYCODONE HCL 5 MG PO TABS
5.0000 mg | ORAL_TABLET | Freq: Four times a day (QID) | ORAL | 0 refills | Status: DC | PRN
  Filled 2024-01-03: qty 15, 4d supply, fill #0

## 2024-01-03 MED ORDER — 0.9 % SODIUM CHLORIDE (POUR BTL) OPTIME
TOPICAL | Status: DC | PRN
  Administered 2024-01-03: 1000 mL

## 2024-01-03 MED ORDER — DEXTROSE 50 % IV SOLN
INTRAVENOUS | Status: AC
  Administered 2024-01-03: 12.5 g via INTRAVENOUS
  Filled 2024-01-03: qty 50

## 2024-01-03 MED ORDER — CHLORHEXIDINE GLUCONATE 0.12 % MT SOLN
15.0000 mL | Freq: Once | OROMUCOSAL | Status: AC
  Administered 2024-01-03: 15 mL via OROMUCOSAL
  Filled 2024-01-03: qty 15

## 2024-01-03 MED ORDER — OXYCODONE HCL 5 MG/5ML PO SOLN
5.0000 mg | Freq: Once | ORAL | Status: AC | PRN
  Administered 2024-01-03: 5 mg via ORAL

## 2024-01-03 MED ORDER — MIDAZOLAM HCL 2 MG/2ML IJ SOLN
INTRAMUSCULAR | Status: AC
  Filled 2024-01-03: qty 2

## 2024-01-03 MED ORDER — SUGAMMADEX SODIUM 200 MG/2ML IV SOLN
INTRAVENOUS | Status: AC
  Filled 2024-01-03: qty 2

## 2024-01-03 SURGICAL SUPPLY — 25 items
BENZOIN TINCTURE PRP APPL 2/3 (GAUZE/BANDAGES/DRESSINGS) ×3 IMPLANT
CANISTER SUCT 3000ML PPV (MISCELLANEOUS) ×3 IMPLANT
CHLORAPREP W/TINT 26 (MISCELLANEOUS) IMPLANT
COVER SURGICAL LIGHT HANDLE (MISCELLANEOUS) ×3 IMPLANT
DERMABOND ADVANCED .7 DNX12 (GAUZE/BANDAGES/DRESSINGS) IMPLANT
DRAPE LAPAROSCOPIC ABDOMINAL (DRAPES) ×3 IMPLANT
ELECT CAUTERY BLADE 6.4 (BLADE) ×3 IMPLANT
ELECT REM PT RETURN 9FT ADLT (ELECTROSURGICAL) ×2 IMPLANT
ELECTRODE REM PT RTRN 9FT ADLT (ELECTROSURGICAL) ×3 IMPLANT
GLOVE BIOGEL PI IND STRL 7.5 (GLOVE) ×3 IMPLANT
GOWN STRL REUS W/ TWL LRG LVL3 (GOWN DISPOSABLE) ×6 IMPLANT
KIT BASIN OR (CUSTOM PROCEDURE TRAY) ×3 IMPLANT
KIT TURNOVER KIT B (KITS) ×3 IMPLANT
NDL HYPO 25GX1X1/2 BEV (NEEDLE) ×3 IMPLANT
NEEDLE HYPO 25GX1X1/2 BEV (NEEDLE) ×2 IMPLANT
NS IRRIG 1000ML POUR BTL (IV SOLUTION) ×3 IMPLANT
PACK GENERAL/GYN (CUSTOM PROCEDURE TRAY) ×3 IMPLANT
PAD ARMBOARD 7.5X6 YLW CONV (MISCELLANEOUS) ×6 IMPLANT
STRIP CLOSURE SKIN 1/2X4 (GAUZE/BANDAGES/DRESSINGS) ×3 IMPLANT
SUT MNCRL AB 4-0 PS2 18 (SUTURE) ×3 IMPLANT
SUT MON AB 5-0 PS2 18 (SUTURE) IMPLANT
SUT NOVA NAB GS-21 0 18 T12 DT (SUTURE) ×3 IMPLANT
SUT VIC AB 3-0 SH 27XBRD (SUTURE) ×3 IMPLANT
SYR CONTROL 10ML LL (SYRINGE) ×3 IMPLANT
TOWEL GREEN STERILE FF (TOWEL DISPOSABLE) ×3 IMPLANT

## 2024-01-03 NOTE — Interval H&P Note (Signed)
 History and Physical Interval Note:  01/03/2024 2:07 PM  Krystal Allen  has presented today for surgery, with the diagnosis of EPIGASTRIC VENTRAL HERNIA, RIGHT NIPPLE CYST.  The various methods of treatment have been discussed with the patient and family. After consideration of risks, benefits and other options for treatment, the patient has consented to  Procedure(s) with comments: OPEN REPAIR OF EPIGASTRIC VENTRAL HERNIA WITH MESH (N/A) - 90 MINUTES LMA EXCISION OF RIGHT NIPPLE CYST (Right) as a surgical intervention.  The patient's history has been reviewed, patient examined, no change in status, stable for surgery.  I have reviewed the patient's chart and labs.  Questions were answered to the patient's satisfaction.     Wynona Luna

## 2024-01-03 NOTE — Anesthesia Procedure Notes (Signed)
 Procedure Name: Intubation Date/Time: 01/03/2024 3:35 PM  Performed by: Thomasene Ripple, CRNAPre-anesthesia Checklist: Patient identified, Emergency Drugs available, Suction available and Patient being monitored Patient Re-evaluated:Patient Re-evaluated prior to induction Oxygen Delivery Method: Circle System Utilized Preoxygenation: Pre-oxygenation with 100% oxygen Induction Type: IV induction Ventilation: Mask ventilation without difficulty and Oral airway inserted - appropriate to patient size Laryngoscope Size: Miller and 3 Grade View: Grade I Tube type: Oral Tube size: 7.0 mm Number of attempts: 1 Airway Equipment and Method: Stylet and Oral airway Placement Confirmation: ETT inserted through vocal cords under direct vision, positive ETCO2 and breath sounds checked- equal and bilateral Secured at: 21 cm Tube secured with: Tape Dental Injury: Teeth and Oropharynx as per pre-operative assessment

## 2024-01-03 NOTE — Progress Notes (Signed)
 Incoming call from pt stating that she was not aware she was supposed to hold her Jardiance, and just saw it on her instructions. Last dose was yesterday. Dr. Richardson Landry made aware and OK to proceed, Dr. Corliss Skains made aware and OK to proceed. Pt notified.

## 2024-01-03 NOTE — Op Note (Signed)
 Pre-op Diagnosis:  Right nipple cyst/ epigastric ventral hernia Post-op Diagnosis:  Right nipple cyst/ intact abdominal wall Procedure:  Abdominal wound exploration/ excision of sebaceous cyst - right nipple (1 cm) Surgeon:  Wynona Luna Assistant:  Saunders Glance, PA-C  Anesthesia: General Indications:This is a 68 year old female who presents with a 25-month history of a tender area on the lateral side of her right nipple with some milky purulent drainage.  The patient states that previously she just had a small lump on the side of her nipple.  She had a recent mammogram that was unremarkable.  She presents now to discuss excision of this area to prevent recurrence.   The patient has had a previous umbilical hernia repair many years ago performed in [facility].  She has developed a lump in her epigastrium above her previous incision.  This has enlarged and become uncomfortable.  She had a CT scan last year for examination of her abdominal aorta.  Incidental finding on this showed a small epigastric hernia with a 1.5 cm fascial defect.  She denies any GI obstructive symptoms.  Description of procedure: The patient was brought to the operating room placed in supine position on operating table.  After an adequate level general anesthesia was obtained, the patient's abdomen and right breast were prepped with ChloraPrep and draped sterile fashion.  The nipple was prepped separately with Betadine due to the ongoing drainage.  We isolated the breast away from the main part of the incision.  The patient has a small vertical incision above her umbilicus.  Previously I had palpated the zoomed hernia just above this incision.  We made a vertical midline incision in this area after infiltrating with local anesthetic.  We dissected down through the subcutaneous tissues with cautery.  I dissected down to the linea alba.  I did not encounter any fascial defects.  There is some laxity to the abdominal wall.  I explored  the entire abdominal wall for a distance of about 10 cm from the base of the umbilicus superiorly.  I also explored at least 3 cm on each side of the midline.  I did not identify any fascial defects.  The abdominal wall musculature is fairly thin but there were no hernia defects.  We infiltrated with local anesthetic.  This wound was closed with a deep layer of 3-0 Vicryl and a subcuticular 4-0 Monocryl.  Benzoin and Steri-Strips were applied.  I turned my attention to the patient's right nipple.  She has a 7 mm cyst in the lateral portion of her nipple with some yellowish drainage.  I injected local anesthetic under the nipple.  I then made an elliptical incision to excise the entire cyst.  Hemostasis was obtained with cautery.  There is no residual cyst visible.  No further drainage noted.  I then reapproximated the skin of the nipple with 5-0 Monocryl.  Dermabond was used to seal the incision.  The patient was then extubated and brought to the recovery room in stable condition.  All sponge, instrument, and needle counts are correct.  Wilmon Arms. Corliss Skains, MD, [facility]  General Surgery   01/03/2024 4:24 PM

## 2024-01-03 NOTE — Transfer of Care (Signed)
 Immediate Anesthesia Transfer of Care Note  Patient: Krystal Allen  Procedure(s) Performed: OPEN REPAIR OF EPIGASTRIC VENTRAL HERNIA WITH MESH EXCISION OF RIGHT NIPPLE CYST (Right: Breast)  Patient Location: PACU  Anesthesia Type:General  Level of Consciousness: awake, alert , and oriented  Airway & Oxygen Therapy: Patient Spontanous Breathing and Patient connected to face mask oxygen  Post-op Assessment: Report given to RN and Post -op Vital signs reviewed and stable  Post vital signs: Reviewed and stable  Last Vitals:  Vitals Value Taken Time  BP 105/67 01/03/24 1633  Temp    Pulse 77 01/03/24 1640  Resp 15 01/03/24 1640  SpO2 98 % 01/03/24 1640  Vitals shown include unfiled device data.  Last Pain:  Vitals:   01/03/24 1408  TempSrc:   PainSc: 0-No pain         Complications: No notable events documented.

## 2024-01-03 NOTE — Discharge Instructions (Signed)
 [facility] _______[facility]  POST OP INSTRUCTIONS  Always review your discharge instruction sheet given to you by the facility where your surgery was performed. IF YOU HAVE DISABILITY OR FAMILY LEAVE FORMS, YOU MUST BRING THEM TO THE OFFICE FOR PROCESSING.   DO NOT GIVE THEM TO YOUR DOCTOR.  1. A  prescription for pain medication may be given to you upon discharge.  Take your pain medication as prescribed, if needed.  If narcotic pain medicine is not needed, then you may take acetaminophen (Tylenol) or ibuprofen (Advil) as needed. 2. Take your usually prescribed medications unless otherwise directed. If you need a refill on your pain medication, please contact your pharmacy.  They will contact our office to request authorization. Prescriptions will not be filled after 5 pm or on week-ends. 3. You should follow a light diet the first 24 hours after arrival home, such as soup and crackers, etc.  Be sure to include lots of fluids daily.  Resume your normal diet the day after surgery. 4.Most patients will experience some swelling and bruising around the umbilicus or in the groin and scrotum.  Ice packs and reclining will help.  Swelling and bruising can take several days to resolve.  6. It is common to experience some constipation if taking pain medication after surgery.  Increasing fluid intake and taking a stool softener (such as Colace) will usually help or prevent this problem from occurring.  A mild laxative (Milk of Magnesia or Miralax) should be taken according to package directions if there are no bowel movements after 48 hours. 7. Unless discharge instructions indicate otherwise, you may remove your bandages 24-48 hours after surgery, and you may shower at that time.  You may have steri-strips (small skin tapes) in place directly over the incision.  These strips should be left on the skin for 7-10 days.  If your surgeon used skin glue on the incision, you may shower in 24 hours.  The  glue will flake off over the next 2-3 weeks.  Any sutures or staples will be removed at the office during your follow-up visit. 8. ACTIVITIES:  You may resume regular (light) daily activities beginning the next day--such as daily self-care, walking, climbing stairs--gradually increasing activities as tolerated.  You may have sexual intercourse when it is comfortable.  Refrain from any heavy lifting or straining until approved by your doctor.  a.You may drive when you are no longer taking prescription pain medication, you can comfortably wear a seatbelt, and you can safely maneuver your car and apply brakes. b.RETURN TO WORK:   _____________________________________________  9.You should see your doctor in the office for a follow-up appointment approximately 2-3 weeks after your surgery.  Make sure that you call for this appointment within a day or two after you arrive home to insure a convenient appointment time. 10.OTHER INSTRUCTIONS: _________________________    _____________________________________  WHEN TO CALL YOUR DOCTOR: Fever over 101.0 Inability to urinate Nausea and/or vomiting Extreme swelling or bruising Continued bleeding from incision. Increased pain, redness, or drainage from the incision  The clinic staff is available to answer your questions during regular business hours.  Please don't hesitate to call and ask to speak to one of the nurses for clinical concerns.  If you have a medical emergency, go to the nearest emergency room or call 911.  A surgeon from [facility] is always on call at the hospital   [address], [place], [place]  [address] ?  [id number], [place], [place]   [address] [phone] ? [phone] ? FAX [phone] Web site: [email or web address]

## 2024-01-04 ENCOUNTER — Encounter (HOSPITAL_COMMUNITY): Payer: Self-pay | Admitting: Surgery

## 2024-01-04 NOTE — Anesthesia Postprocedure Evaluation (Signed)
 Anesthesia Post Note  Patient: Krystal Allen  Procedure(s) Performed: ABDOMINAL WOUND EXPLORATION (Abdomen) EXCISION OF RIGHT NIPPLE CYST (Right: Breast)     Patient location during evaluation: PACU Anesthesia Type: General Level of consciousness: awake and alert Pain management: pain level controlled Vital Signs Assessment: post-procedure vital signs reviewed and stable Respiratory status: spontaneous breathing, nonlabored ventilation, respiratory function stable and patient connected to nasal cannula oxygen Cardiovascular status: blood pressure returned to baseline and stable Postop Assessment: no apparent nausea or vomiting Anesthetic complications: no   No notable events documented.  Last Vitals:  Vitals:   01/03/24 1800 01/03/24 1815  BP: 131/82 127/79  Pulse: 74   Resp: 20 16  Temp:  36.7 C  SpO2: 100% 100%    Last Pain:  Vitals:   01/03/24 1800  TempSrc:   PainSc: Asleep                 Mariann Barter

## 2024-01-05 ENCOUNTER — Other Ambulatory Visit: Payer: Self-pay

## 2024-01-05 ENCOUNTER — Other Ambulatory Visit: Payer: Self-pay | Admitting: Internal Medicine

## 2024-01-05 ENCOUNTER — Other Ambulatory Visit (HOSPITAL_COMMUNITY): Payer: Self-pay

## 2024-01-05 ENCOUNTER — Telehealth: Payer: Self-pay

## 2024-01-05 ENCOUNTER — Telehealth: Payer: Self-pay | Admitting: Internal Medicine

## 2024-01-05 LAB — SURGICAL PATHOLOGY

## 2024-01-05 MED ORDER — FAMOTIDINE 20 MG PO TABS
20.0000 mg | ORAL_TABLET | Freq: Two times a day (BID) | ORAL | 0 refills | Status: DC | PRN
  Filled 2024-01-05 – 2024-01-15 (×2): qty 60, 30d supply, fill #0

## 2024-01-05 NOTE — Progress Notes (Signed)
 Courtesy fill for Pepcid.

## 2024-01-05 NOTE — Telephone Encounter (Signed)
 Krystal Allen called in and states she had surgery on the 5th and is not mobile right now.  [place] made an appointment to see Dr. Allena Katz on April 1st.  [name] would like to see if we can call in Famotidine as a courtesy since she had surgery until her appointment.    She would like that sent to [facility]

## 2024-01-05 NOTE — Telephone Encounter (Signed)
 Pt aware of medication sent to [facility] and to keep April appointment in order to receive future prescriptions.  No further question.

## 2024-01-06 ENCOUNTER — Encounter: Payer: Self-pay | Admitting: Internal Medicine

## 2024-01-10 ENCOUNTER — Other Ambulatory Visit (HOSPITAL_COMMUNITY): Payer: Self-pay

## 2024-01-15 ENCOUNTER — Other Ambulatory Visit (HOSPITAL_COMMUNITY): Payer: Self-pay

## 2024-01-15 ENCOUNTER — Other Ambulatory Visit: Payer: Self-pay

## 2024-01-17 ENCOUNTER — Encounter (HOSPITAL_BASED_OUTPATIENT_CLINIC_OR_DEPARTMENT_OTHER): Payer: Self-pay | Admitting: Pulmonary Disease

## 2024-01-17 ENCOUNTER — Ambulatory Visit: Payer: Self-pay | Admitting: Internal Medicine

## 2024-01-17 ENCOUNTER — Ambulatory Visit (HOSPITAL_BASED_OUTPATIENT_CLINIC_OR_DEPARTMENT_OTHER): Payer: 59 | Admitting: Pulmonary Disease

## 2024-01-17 VITALS — BP 122/70 | HR 74 | Ht 66.0 in | Wt 237.9 lb

## 2024-01-17 DIAGNOSIS — E1169 Type 2 diabetes mellitus with other specified complication: Secondary | ICD-10-CM

## 2024-01-17 DIAGNOSIS — J439 Emphysema, unspecified: Secondary | ICD-10-CM

## 2024-01-17 DIAGNOSIS — G4734 Idiopathic sleep related nonobstructive alveolar hypoventilation: Secondary | ICD-10-CM | POA: Diagnosis not present

## 2024-01-17 DIAGNOSIS — J8482 Adult pulmonary Langerhans cell histiocytosis: Secondary | ICD-10-CM | POA: Diagnosis not present

## 2024-01-17 DIAGNOSIS — J432 Centrilobular emphysema: Secondary | ICD-10-CM

## 2024-01-17 NOTE — Progress Notes (Signed)
 Subjective:   PATIENT ID: Krystal Allen GENDER: female DOB: [birth year], MRN: [id number]   HPI  Chief Complaint  Patient presents with   Follow-up    Chronic hypoxemic respiratory failure, Adult [facility]    Reason for Visit: Follow-up  Ms. Krystal Allen is a 68 year old female former smoker with emphysema, chronic hypoxemic respiratory failure, Pulmonary Langerhans cell histiocytosis, hypertension, hyperlipidemia who presents follow-up.  She was hospitalized in October 2022 for COPD exacerbation secondary to influenza. She quit smoking since then and has done well. She does notice she is snacking more in place of her nicotine craving. She has shortness of breath with wheezing. Wheezing occurs at night sometimes. No chronic cough. Symptoms worsen with changes in weather and will use her rescue inhaler 1-2 a week. She is compliant with Breo 200 daily. She wears her oxygen when she feels short of breath. She checks her O2 levels which usually go down to 89%. She reports good quality of sleep >8 hours. She does snore. Her roommate does not report witnessed apnea or gasping. No morning headaches or fatigue.   04/15/22 Since our last visit she has been compliant with Breo and Spiriva and feels this is helping her. She has been wearing oxygen at night only when she thinks she needs it. Occasional shortness of breath. Sometimes coughing but this has improved. Wheezing with exertion. Does not feel like her albuterol inhaler is effective. Uses it every other with several puffs. No exacerbations in the last 8 months when she was hospitalized for the flu.  11/03/22 Since our last visit she is compliant with her Breo and Spiriva. She uses albuterol once a week when being outdoors. Weather changes including the cold care trigger wheezing. No limitations in ADLs but not exercising. Denies exacerbations requiring prednisone or antibiotics. She has not compliant with nightly oxygen. Has shortness of breath with  moderate exertion. She is working with her PCP to lose weight and currently on phentermine.   04/10/23 Since our last visit she reports breathing is overall doing well. No exacerbations since our last visit. Did recently go to ED in April for allergic reaction. Denies shortness of breath cough or wheezing. Sometimes uses rescue inhaler at least once a week. Usually with day time activity. No night symptoms. Compliant with nocturnal O2. Not really active but has started walking a few days a week. She is working on weight loss with ozempic and has lost 8lbs.  01/17/24 Since our last visit she reports she is doing well from breathing standpoint. Denies shortness of breath, cough or wheezing. Compliant with Breo and Spiriva. No exacerbations since our last visit. Had hernia repair without issue.  Social History: Former smoker. 1/2 ppd x 40 years [employer] x 33 years  Past Medical History:  Diagnosis Date   Allergy    COPD (chronic obstructive pulmonary disease) (HCC)    Depression    Diabetes mellitus without complication (HCC)    Dyspnea    GERD (gastroesophageal reflux disease)    Hernia, abdominal    History of chicken pox    Hyperlipidemia    Hypertension    Internal hemorrhoids with Grade 3 prolapse and bleeding 06/19/2013   Osteoarthritis    Personal history of colonic adenomas 06/25/2013     Family History  Problem Relation Age of Onset   Cancer Father        prostate   Hypertension Mother    Diabetes Mother    Hypertension Sister    Heart  disease Maternal Uncle    Stroke Maternal Grandmother    Hypertension Maternal Grandmother    Hypertension Sister    Colon cancer Neg Hx    Stomach cancer Neg Hx    Rectal cancer Neg Hx    Colon polyps Neg Hx      Social History   Occupational History   Occupation: PCA    Employer: [employer]  Tobacco Use   Smoking status: Former    Current packs/day: 0.00    Average packs/day: 0.5 packs/day for 40.0 years (20.0 ttl pk-yrs)     Types: Cigarettes    Start date: 07/1981    Quit date: 07/2021    Years since quitting: 2.4    Passive exposure: Never   Smokeless tobacco: Never   Tobacco comments:    Quit 102022  Vaping Use   Vaping status: Former  Substance and Sexual Activity   Alcohol use: Yes    Comment: occasional   Drug use: No   Sexual activity: Not Currently    Allergies  Allergen Reactions   Sulfa Antibiotics Anaphylaxis and Hives   Chantix [Varenicline Tartrate]     Bad dreams   Fish Allergy Other (See Comments)    Severe stomach cramps    Hygroton [Chlorthalidone]     Swelling or hives   Penicillins Hives    Pt took Amoxicillin in 2019 without issue, reports PCN caused rash in the past   Latex Rash and Other (See Comments)    Dry skin     Outpatient Medications Prior to Visit  Medication Sig Dispense Refill   Accu-Chek Softclix Lancets lancets Use to check blood sugar three times daily. E11.69 100 each 6   acetaminophen (TYLENOL 8 HOUR) 650 MG CR tablet Take 1 tablet (650 mg total) by mouth 2 (two) times daily as needed for pain. 60 tablet 5   albuterol (PROAIR HFA) 108 (90 Base) MCG/ACT inhaler Inhale 2 puffs into the lungs every 4 (four) hours as needed for wheezing or shortness of breath. 6.7 g 0   albuterol (PROVENTIL) (2.5 MG/3ML) 0.083% nebulizer solution TAKE 3 MLS BY NEBULIZATION EVERY 6 (SIX) HOURS AS NEEDED FOR WHEEZING OR SHORTNESS OF BREATH. 90 mL 1   aspirin EC 81 MG tablet Take 1 tablet (81 mg total) by mouth daily. 100 tablet 1   atorvastatin (LIPITOR) 80 MG tablet Take 1 tablet (80 mg total) by mouth daily. 90 tablet 3   Blood Glucose Monitoring Suppl (ACCU-CHEK GUIDE) w/Device KIT Use to check blood sugar three times daily. E11.69 1 kit 0   cetirizine (ZYRTEC) 10 MG tablet Take 1 tablet (10 mg total) by mouth daily. 30 tablet 11   Continuous Glucose Receiver (FREESTYLE LIBRE 2 READER) DEVI Use to check blood sugar continuously throughout the day. Change sensors once every 14  days. 1 each 0   Continuous Glucose Sensor (FREESTYLE LIBRE 2 SENSOR) MISC Use to check blood sugar continuously throughout the day. 2 each 6   diphenhydrAMINE (BENADRYL) 25 mg capsule Take 25-50 mg by mouth every 6 (six) hours as needed for itching or allergies.     Elastic Bandages & Supports (MEDICAL COMPRESSION STOCKINGS) MISC R60.0 1 each 0   empagliflozin (JARDIANCE) 10 MG TABS tablet Take 1 tablet (10 mg total) by mouth daily before breakfast. 90 tablet 3   EPINEPHrine 0.3 mg/0.3 mL IJ SOAJ injection Inject 0.3 mg into the muscle as needed for anaphylaxis. 2 each 2   Evolocumab (REPATHA SURECLICK) 140 MG/ML SOAJ  Inject 280 mg into the skin every 14 (fourteen) days. 6 mL 3   ezetimibe (ZETIA) 10 MG tablet Take 1 tablet (10 mg total) by mouth daily. 90 tablet 2   famotidine (PEPCID) 20 MG tablet Take 1 tablet (20 mg total) by mouth 2 (two) times daily as needed (hives).Courtesy fill.  Needs office visit for further refills. 60 tablet 0   fexofenadine (ALLEGRA ALLERGY) 180 MG tablet Take 1 tablet (180 mg total) by mouth 2 (two) times daily as needed (hives). 60 tablet 5   fluticasone (FLONASE) 50 MCG/ACT nasal spray Place 2 sprays into both nostrils daily. (Patient taking differently: Place 2 sprays into both nostrils daily as needed for allergies.) 16 g 0   fluticasone furoate-vilanterol (BREO ELLIPTA) 200-25 MCG/ACT AEPB Inhale 1 puff into the lungs once daily. 60 each 11   furosemide (LASIX) 20 MG tablet Take 1 tablet (20 mg total) by mouth daily as needed (for weight gain of 2-3 lbs overnight or swelling in legs/ankles). 90 tablet 2   glucose blood (ACCU-CHEK GUIDE) test strip Use to check blood sugar three times daily. E11.69 100 each 12   hydrochlorothiazide (HYDRODIURIL) 25 MG tablet Take 1 tablet (25 mg total) by mouth daily. 90 tablet 1   Insulin Pen Needle 32G X 4 MM MISC Use 4 times daily with insulin pen. 100 each 0   losartan (COZAAR) 25 MG tablet Take 1 tablet (25 mg total) by mouth  daily. 90 tablet 3   Olopatadine HCl 0.2 % SOLN Place 1 drop into both eyes daily as needed (allergies). 2.5 mL 5   omeprazole (PRILOSEC) 20 MG capsule Take 1 capsule (20 mg total) by mouth daily. 90 capsule 3   OXYGEN Inhale 2 L/min into the lungs as needed.     potassium chloride SA (KLOR-CON M) 20 MEQ tablet Take 1 tablet (20 mEq total) by mouth 2 (two) times daily. 180 tablet 2   Respiratory Therapy Supplies (FLUTTER) DEVI Use as directed 1 each 0   Semaglutide, 2 MG/DOSE, 8 MG/3ML SOPN Inject 2 mg as directed once a week. 3 mL 3   sodium chloride (OCEAN) 0.65 % nasal spray Place 2 sprays into both nostrils every 4 (four) hours. 44 mL 0   Tiotropium Bromide Monohydrate (SPIRIVA RESPIMAT) 2.5 MCG/ACT AERS Inhale 2 puffs into the lungs daily. 4 g 11   oxybutynin (DITROPAN XL) 5 MG 24 hr tablet Take 1 tablet (5 mg total) by mouth at bedtime. (Patient not taking: Reported on 01/17/2024) 30 tablet 0   oxyCODONE (OXY IR/ROXICODONE) 5 MG immediate release tablet Take 1 tablet (5 mg total) by mouth every 6 (six) hours as needed for severe pain (pain score 7-10). (Patient not taking: Reported on 01/17/2024) 20 tablet 0   triamcinolone cream (KENALOG) 0.1 % Apply 1 Application topically 2 (two) times daily. (Patient not taking: Reported on 01/17/2024) 30 g 0   No facility-administered medications prior to visit.    Review of Systems  Constitutional:  Negative for chills, diaphoresis, fever, malaise/fatigue and weight loss.  HENT:  Negative for congestion.   Respiratory:  Negative for cough, hemoptysis, sputum production, shortness of breath and wheezing.   Cardiovascular:  Negative for chest pain, palpitations and leg swelling.     Objective:   Vitals:   01/17/24 0920  BP: 122/70  Pulse: 74  SpO2: 94%  Weight: 237 lb 14.4 oz (107.9 kg)  Height: 5\' 6"  (1.676 m)  SpO2: 94 %  Physical Exam: General: Well-appearing,  no acute distress HENT: [place], AT Eyes: EOMI, no scleral icterus Respiratory:  Clear to auscultation bilaterally.  No crackles, wheezing or rales Cardiovascular: RRR, -M/R/G, no JVD Extremities:-Edema,-tenderness Neuro: AAO x4, CNII-XII grossly intact Psych: Normal mood, normal affect  Data Reviewed:  Imaging: CT 12/15/17 - Mild emphysema. Numerous pulmonary nodules and thin walled cysts bilaterally. Larged lung nodule is 6 mm in the RUL CXR 08/19/21 - Normal. No infiltrate, effusion or edema. CT Coronary 03/31/22 - Visualized lung parenchyma with no pulmonary nodules, masses, infiltrate, effusion or pneumothorax. CXR 08/10/22 - Normal. No infiltrate effusion or edema  PFT: 01/26/21 FVC 2.99 (97%) FEV1 2.49 (103%) Ratio 79  TLC 88% DLCO 53% Interpretation: Normal spirometry and lung volumes. No significant BD response present. Moderately reduced gas exchange.  04/15/22 FVC 2.71 (91%) FEV1 2.06 (89%) Ratio 79 TLC 90% DLCO 60% Interpretation: Normal spirometry and lung volumes. Improved gas exchange to mildly reduced DLCO  09/18/23 FVC 2.88 (79%) FEV1 2.25 (81%) Ratio 78  TLC 100% DLCO 77% Interpretation: No obstructive or restrictive defect. Improved DLCO to 77%  Labs: CBC    Component Value Date/Time   WBC 5.6 12/28/2023 0921   RBC 4.76 12/28/2023 0921   HGB 13.3 12/28/2023 0921   HGB 14.4 10/03/2022 1120   HCT 41.4 12/28/2023 0921   HCT 41.8 10/03/2022 1120   PLT 272 12/28/2023 0921   PLT 260 10/03/2022 1120   MCV 87.0 12/28/2023 0921   MCV 85 10/03/2022 1120   MCH 27.9 12/28/2023 0921   MCHC 32.1 12/28/2023 0921   RDW 18.9 (H) 12/28/2023 0921   RDW 17.9 (H) 10/03/2022 1120   LYMPHSABS 1.9 02/26/2023 1849   LYMPHSABS 2.2 02/10/2020 1000   MONOABS 0.3 02/26/2023 1849   EOSABS 0.0 02/26/2023 1849   EOSABS 0.2 02/10/2020 1000   BASOSABS 0.0 02/26/2023 1849   BASOSABS 0.1 02/10/2020 1000   Ambulatory O2   11/06/21 - Interpretation: Recommend 3L pulsed with exertion and sleep ONO 12/2021 demonstrated ongoing hypoxemia  04/15/22 - Interpretation: No  desaturations on ambulation      Assessment & Plan:   Discussion: 68 year old female former smoker with emphysema, chronic hypoxemic respiratory failure, Pulmonary langerhans cell histiocytosis, HTN, HLD who presents for follow-up. Well controlled on triple therapy. Compliant with nocturnal oxygen. Discussed clinical course and management of COPD including bronchodilator regimen, preventive care including vaccinations and action plan for exacerbation.  Emphysema --CONTINUE Breo 200-25 mcg ONE puff ONCE a day.  --CONTINUE Spiriva 2.5 mcg TWO puffs ONCE a day.  --CONTINUE Albuterol AS NEEDED  Nocturnal Hypoxemia --CONTINUE 2L oxygen via [place] nightly  --Previously ordered ONO however unable to obtain due to billing issues. Will need future ONO at some point to determine nocturnal O2 needs.  Pulmonary Langerhans cell histiocytosis Mildly reduced DLCO --Improved DLCO on 09/2023 PFTs --Next PFTs due in 18 months (May 2026)  Morbid obesity BMI 42 --Currently on ozempic with PCP --Discussed regular aerobic exercise five days. Start slow and work up to 20-30 minutes daily --Take small weights with you during your walks  Health Maintenance Immunization History  Administered Date(s) Administered   Fluad Quad(high Dose 65+) 08/16/2022   Fluad Trivalent(High Dose 65+) 08/04/2023   Influenza Split 07/10/2017   Influenza,inj,Quad PF,6+ Mos 08/14/2014, 09/14/2020, 09/13/2021   PFIZER Comirnaty(Gray Top)Covid-19 Tri-Sucrose Vaccine 01/25/2021   PFIZER(Purple Top)SARS-COV-2 Vaccination 05/12/2020, 06/02/2020   PNEUMOCOCCAL CONJUGATE-20 08/24/2021   Respiratory Syncytial Virus Vaccine,Recomb Aduvanted(Arexvy) 08/16/2022   Tdap 08/14/2014   CT Lung Screen- Enrolled. Not  completed. Will message team.  No orders of the defined types were placed in this encounter.  No orders of the defined types were placed in this encounter.   Return in about 9 months (around 10/18/2024).  I have spent a  total time of 35-minutes on the day of the appointment including chart review, data review, collecting history, coordinating care and discussing medical diagnosis and plan with the patient/family. Past medical history, allergies, medications were reviewed. Pertinent imaging, labs and tests included in this note have been reviewed and interpreted independently by me.  Kelan Pritt Mechele Collin, MD [facility] 01/17/2024 9:59 AM

## 2024-01-17 NOTE — Telephone Encounter (Signed)
  Chief Complaint: hypoglycemia Symptoms: denies Frequency: intermittent during the night Pertinent Negatives: Patient denies all symptoms Disposition: [] ED /[] Urgent Care (no appt availability in office) / [] Appointment(In office/virtual)/ []  [facility]/ [x] Home Care/ [] Refused Recommended Disposition /[] [facility]/ [x]  Follow-up with PCP Additional Notes: Patient calling in for asymptomatic hypoglycemia. Approximately 30 minutes prior to calling in she noted her CGM alarming for low blood sugar of 52, she took her glucose tablets and now her blood sugar is 108.  She reports recent diabetic medication changes and wondering if her doses need adjusting. She does not feel she needs an appointment at this time, letting Dr. Laural Benes know of hypoglycemia to see if medication needs adjusting.  Recently she noted her sugars dropping to 60's, occasionally 50's especially during the night. She typically is asymptomatic but reports one time she did feel a little dizzy. Currently feels well with no symptoms, she called while driving to [employer] to replenish her glucose tab supply. Care advice discussed as documented in protocol. Please follow up with Talbert Forest regarding hypoglycemia and any recommendations.   Copied from CRM [id number]. Topic: Clinical - Red Word Triage >> Jan 17, 2024  4:21 PM Emylou G wrote: [facility] that prompted transfer to Nurse Triage: blood sugar between 50-60 Reason for Disposition  Low blood sugar prevention, questions about  [1] Blood glucose 70  mg/dL (3.9 mmol/L) or below OR symptomatic, now improved with Care Advice AND [2] cause unknown  Protocols used: Diabetes - Low Blood Sugar-A-AH

## 2024-01-17 NOTE — Patient Instructions (Signed)
 Emphysema --CONTINUE Breo 200-25 mcg ONE puff ONCE a day.  --CONTINUE Spiriva 2.5 mcg TWO puffs ONCE a day.  --CONTINUE Albuterol AS NEEDED  Nocturnal Hypoxemia --CONTINUE 2L oxygen via [place] nightly  --Previously ordered ONO however unable to obtain due to billing issues. Will need future ONO at some point to determine nocturnal O2 needs.  Pulmonary Langerhans cell histiocytosis Mildly reduced DLCO --Improved DLCO on 09/2023 PFTs

## 2024-01-18 NOTE — Telephone Encounter (Signed)
 PC placed to pt this a.m. LVMM informing of who I am and that I was calling in response to her message from yesterday.  Advise to make sure she eats her 3 meals a day even though Ozempic decreases appetite. We may need to decrease the dose of Ozempic from 2 mg to 1 mg as well.  Will have RN try to reach her again later.   Krystal Allen: Please try to reach patient again this afternoon. She is on Ozempic 2 mg.  Ozempic decreases the appetite.  Find out whether or not she is still trying to eat 3 meals a day even if they are smaller meals.  Try not to skip meals.  If she is eating 3 meals and still having low blood sugars, we need to decrease the Ozempic to 1 mg.  Please let me know her response.

## 2024-01-22 DIAGNOSIS — R0602 Shortness of breath: Secondary | ICD-10-CM | POA: Diagnosis not present

## 2024-01-22 MED ORDER — SEMAGLUTIDE (1 MG/DOSE) 4 MG/3ML ~~LOC~~ SOPN
1.0000 mg | PEN_INJECTOR | SUBCUTANEOUS | 2 refills | Status: DC
  Filled 2024-01-22 – 2024-01-24 (×2): qty 3, 28d supply, fill #0
  Filled 2024-02-13: qty 3, 28d supply, fill #1
  Filled 2024-03-03 – 2024-03-06 (×2): qty 3, 28d supply, fill #2

## 2024-01-22 NOTE — Addendum Note (Signed)
 Addended by: Jonah Blue B on: 01/22/2024 11:24 PM   Modules accepted: Orders

## 2024-01-22 NOTE — Telephone Encounter (Signed)
 Spoke with patient . Patient is trying to eat 3 meals a day. Patient reports a decrease in appetite and low BS in the evening. Encourage patient the try to eat  3 small meals along with 2 snacks daily. This will help regular her BS so that she will not experience the drop in her blood sugar in the evening as she reports. Share with patient possible snack ideas. Patient said that she would try to do this. Advised that her PCP wants her to try to eat 3 meals without skipping meals and also that we could decrease Ozempic from 2 mg to 1 mg. Patient said she would like Ozempic from 2 mg to 1 mg . Voiced that she would need a new prescription because the pen , she has will not let her dial a 1 mg injection dose.

## 2024-01-23 ENCOUNTER — Other Ambulatory Visit (HOSPITAL_COMMUNITY): Payer: Self-pay

## 2024-01-23 ENCOUNTER — Other Ambulatory Visit: Payer: Self-pay

## 2024-01-23 ENCOUNTER — Ambulatory Visit (INDEPENDENT_AMBULATORY_CARE_PROVIDER_SITE_OTHER): Admitting: Internal Medicine

## 2024-01-23 ENCOUNTER — Encounter: Payer: Self-pay | Admitting: Internal Medicine

## 2024-01-23 VITALS — BP 104/60 | HR 68 | Temp 98.1°F | Resp 18 | Ht 66.25 in | Wt 238.0 lb

## 2024-01-23 DIAGNOSIS — J3089 Other allergic rhinitis: Secondary | ICD-10-CM

## 2024-01-23 DIAGNOSIS — L501 Idiopathic urticaria: Secondary | ICD-10-CM

## 2024-01-23 DIAGNOSIS — T781XXD Other adverse food reactions, not elsewhere classified, subsequent encounter: Secondary | ICD-10-CM

## 2024-01-23 DIAGNOSIS — J302 Other seasonal allergic rhinitis: Secondary | ICD-10-CM

## 2024-01-23 MED ORDER — FAMOTIDINE 20 MG PO TABS
20.0000 mg | ORAL_TABLET | Freq: Two times a day (BID) | ORAL | 11 refills | Status: AC | PRN
  Filled 2024-01-23 – 2024-03-03 (×2): qty 60, 30d supply, fill #0
  Filled 2024-05-08: qty 60, 30d supply, fill #1
  Filled 2024-06-13: qty 60, 30d supply, fill #2
  Filled 2024-09-10: qty 60, 30d supply, fill #3
  Filled 2024-10-04: qty 60, 30d supply, fill #4

## 2024-01-23 MED ORDER — FLUTICASONE PROPIONATE 50 MCG/ACT NA SUSP
2.0000 | Freq: Every day | NASAL | 11 refills | Status: AC | PRN
  Filled 2024-01-23 (×2): qty 16, 30d supply, fill #0

## 2024-01-23 MED ORDER — FEXOFENADINE HCL 180 MG PO TABS
180.0000 mg | ORAL_TABLET | Freq: Two times a day (BID) | ORAL | 11 refills | Status: AC | PRN
  Filled 2024-01-23 (×2): qty 60, 30d supply, fill #0

## 2024-01-23 NOTE — Patient Instructions (Addendum)
 Allergic Rhinoconjunctivitis - Positive skin test 09/2022: dust mite, mold, tree - Use nasal saline rinses before nose sprays such as with Neilmed Sinus Rinse.  Use distilled water.   - Use Flonase 2 sprays each nostril daily as needed. Aim upward and outward. - Use Allegra (Fexofenadine) 180mg  daily.  - Use Olopatadine 0.2% 1 drop each eye daily as needed for itchy, watery eyes.    Chronic Idiopathic Urticaria/Angioedema (Hives/Swelling) Chronic Kidney Disease  - At this time etiology of hives and swelling is unknown.  Hives can be caused by a variety of different triggers including illness/infection, foods, medications, stings, exercise, pressure, vibrations, extremes of temperature to name a few however majority of the time there is no identifiable trigger.  -Decrease to Allegra (Fexofenadine) 180mg  daily.   -If hives swelling reoccur, increase to Allegra 180mg  twice daily.   -If no improvement in 2-3 days, add Pepcid 20mg  twice daily and continue Allegra 180mg  twice daily. - Will monitor for side effects with high dose anti-histamines with CKD.    Food Allergy:  - please strictly avoid fish and shellfish.   - for SKIN only reaction, okay to take Benadryl 25mg  capsules every 6 hours - for SKIN + ANY additional symptoms, OR IF concern for LIFE THREATENING reaction = Epipen Autoinjector EpiPen 0.3 mg. - If using Epinephrine autoinjector, call 911 or go to the ER.

## 2024-01-23 NOTE — Progress Notes (Signed)
 FOLLOW UP Date of Service/Encounter:  01/23/24   Subjective:  Krystal Allen (DOB: 1955-12-13) is a 68 y.o. female who [facility] for follow up for allergic rhinitis, idiopathic urticaria/angioedema, food allergies.  Followed by Pulm for emphysema 2/2 to smoking and [facility].    History obtained from: chart review and patient. Last visit was on 12/02/2022 with me and at the time was doing well in terms of hives with Allegra, some flare up of allergies on PRN Flonase, avoiding seafood.  Since last visit, reports hives/swelling are doing very well.  Last episodes was many months ago.  Taking Allegra 180mg  BID.  Has not needed Pepcid.  No issues with getting too sleepy or side effects with Allegra .  Reports being told her renal function is stable.    Some runny nose but not much congestion, post nasal drip, sneezing.  She is happy with the Allegra, only uses Flonase here and there.  Avoiding fish and shellfish. Has an Epipen, no accidental exposures.    Past Medical History: Past Medical History:  Diagnosis Date   Allergy    COPD (chronic obstructive pulmonary disease) (HCC)    Depression    Diabetes mellitus without complication (HCC)    Dyspnea    GERD (gastroesophageal reflux disease)    Hernia, abdominal    History of chicken pox    Hyperlipidemia    Hypertension    Internal hemorrhoids with Grade 3 prolapse and bleeding 06/19/2013   Osteoarthritis    Personal history of colonic adenomas 06/25/2013    Objective:  BP 104/60 (BP Location: Right Arm, Patient Position: Sitting, Cuff Size: Large)   Pulse 68   Temp 98.1 F (36.7 C) (Temporal)   Resp 18   Ht 5' 6.25" (1.683 m)   Wt 238 lb (108 kg)   SpO2 94%   BMI 38.12 kg/m  Body mass index is 38.12 kg/m. Physical Exam: GEN: alert, well developed HEENT: clear conjunctiva, nose with mild inferior turbinate hypertrophy, pink nasal mucosa, + clear rhinorrhea, no  cobblestoning HEART: regular rate and rhythm, no murmur LUNGS: clear to auscultation bilaterally, no coughing, unlabored respiration SKIN: no rashes or lesions  Assessment:   1. Seasonal and perennial allergic rhinitis   2. Idiopathic urticaria   3. Adverse food reaction, subsequent encounter     Plan/Recommendations:  Allergic Rhinoconjunctivitis - Controlled  - Positive skin test 09/2022: dust mite, mold, tree - Use nasal saline rinses before nose sprays such as with Neilmed Sinus Rinse.  Use distilled water.   - Use Flonase 2 sprays each nostril daily as needed. Aim upward and outward. - Use Allegra (Fexofenadine) 180mg  daily.  - Use Olopatadine 0.2% 1 drop each eye daily as needed for itchy, watery eyes.    Chronic Idiopathic Urticaria/Angioedema (Hives/Swelling) Chronic Kidney Disease - Controlled, will decrease dose of Allegra.   - At this time etiology of hives and swelling is unknown.  Hives can be caused by a variety of different triggers including illness/infection, foods, medications, stings, exercise, pressure, vibrations, extremes of temperature to name a few however majority of the time there is no identifiable trigger. - Tryptase 09/2022 15ug/L, tryptase 01/2023 14ug/L.  Likely elevated due to CKD.  No history of recurrent anaphylaxis.  -Decrease to Allegra (Fexofenadine) 180mg  daily.   -If hives swelling reoccur, increase to Allegra 180mg  twice daily.   -If no improvement in 2-3 days, add Pepcid 20mg  twice daily and continue Allegra 180mg  twice  daily. - Will monitor for side effects with high dose anti-histamines with CKD.    Food Allergy:  - please strictly avoid fish and shellfish.   - SPT positive 09/2022 to fish but negative to shellfish.   - Initial rxn: severe abdominal cramps with seafood ingestion, no interest in reintroduction  - for SKIN only reaction, okay to take Benadryl 25mg  capsules every 6 hours - for SKIN + ANY additional symptoms, OR IF concern for  LIFE THREATENING reaction = Epipen Autoinjector EpiPen 0.3 mg. - If using Epinephrine autoinjector, call 911 or go to the ER.     Return in about 1 year (around 01/22/2025).  Alesia Morin, MD [facility]

## 2024-01-24 ENCOUNTER — Other Ambulatory Visit: Payer: Self-pay

## 2024-01-24 ENCOUNTER — Other Ambulatory Visit (HOSPITAL_COMMUNITY): Payer: Self-pay

## 2024-01-24 ENCOUNTER — Telehealth: Payer: Self-pay | Admitting: Internal Medicine

## 2024-01-24 ENCOUNTER — Other Ambulatory Visit (HOSPITAL_BASED_OUTPATIENT_CLINIC_OR_DEPARTMENT_OTHER): Payer: Self-pay

## 2024-01-24 NOTE — Telephone Encounter (Signed)
 Correction has already been made on 01/22/24.

## 2024-01-24 NOTE — Telephone Encounter (Signed)
 Copied from CRM [id number]. Topic: Clinical - Prescription Issue >> Jan 24, 2024 11:09 AM Shelah Lewandowsky wrote: Reason for CRM: Patient spoke with triage on 3/24 and they advised to change dosage of Ozempic from 2mg  to 1mg , will need new prescription for home delivery- please [phone]

## 2024-01-30 ENCOUNTER — Ambulatory Visit: Admitting: Internal Medicine

## 2024-02-13 ENCOUNTER — Other Ambulatory Visit (HOSPITAL_COMMUNITY): Payer: Self-pay

## 2024-02-14 ENCOUNTER — Other Ambulatory Visit (HOSPITAL_COMMUNITY): Payer: Self-pay

## 2024-02-22 DIAGNOSIS — R0602 Shortness of breath: Secondary | ICD-10-CM | POA: Diagnosis not present

## 2024-02-26 ENCOUNTER — Other Ambulatory Visit: Payer: Self-pay | Admitting: Internal Medicine

## 2024-02-27 ENCOUNTER — Other Ambulatory Visit: Payer: Self-pay

## 2024-02-27 ENCOUNTER — Other Ambulatory Visit (HOSPITAL_COMMUNITY): Payer: Self-pay

## 2024-02-29 ENCOUNTER — Other Ambulatory Visit (HOSPITAL_COMMUNITY): Payer: Self-pay

## 2024-02-29 ENCOUNTER — Other Ambulatory Visit: Payer: Self-pay

## 2024-02-29 MED ORDER — FUROSEMIDE 20 MG PO TABS
20.0000 mg | ORAL_TABLET | Freq: Every day | ORAL | 0 refills | Status: DC | PRN
  Filled 2024-02-29: qty 90, 90d supply, fill #0

## 2024-03-04 ENCOUNTER — Other Ambulatory Visit: Payer: Self-pay

## 2024-03-04 ENCOUNTER — Other Ambulatory Visit (HOSPITAL_COMMUNITY): Payer: Self-pay

## 2024-03-06 ENCOUNTER — Other Ambulatory Visit: Payer: Self-pay | Admitting: *Deleted

## 2024-03-06 DIAGNOSIS — E785 Hyperlipidemia, unspecified: Secondary | ICD-10-CM

## 2024-03-10 ENCOUNTER — Other Ambulatory Visit: Payer: Self-pay | Admitting: Internal Medicine

## 2024-03-10 DIAGNOSIS — H1011 Acute atopic conjunctivitis, right eye: Secondary | ICD-10-CM

## 2024-03-11 ENCOUNTER — Other Ambulatory Visit: Payer: Self-pay

## 2024-03-11 MED ORDER — OLOPATADINE HCL 0.2 % OP SOLN
1.0000 [drp] | Freq: Every day | OPHTHALMIC | 5 refills | Status: AC | PRN
  Filled 2024-03-11: qty 2.5, 25d supply, fill #0

## 2024-03-12 ENCOUNTER — Telehealth: Payer: Self-pay | Admitting: Internal Medicine

## 2024-03-12 DIAGNOSIS — E1169 Type 2 diabetes mellitus with other specified complication: Secondary | ICD-10-CM

## 2024-03-12 NOTE — Telephone Encounter (Signed)
 Copied from CRM [id number]. Topic: Referral - Request for Referral >> Mar 12, 2024 12:59 PM Jeris Montes S wrote:  Did the patient discuss referral with their provider in the last year? No- Her usual doctor is retiring and advised to contact her PCP (If No - schedule appointment) (If Yes - send message)  Appointment offered? Yes- stated want the referral for now  Type of order/referral and detailed reason for visit: Need a optometrist with specialty in diabetes patients  Preference of office, provider, location: N/A  If referral order, have you been seen by this specialty before? No (If Yes, this issue or another issue? When? Where?  Can we respond through MyChart? No- Patient also has questions regarding her Ozempic , would like for PCP to cb

## 2024-03-13 NOTE — Addendum Note (Signed)
 Addended by: Concetta Dee B on: 03/13/2024 01:04 PM   Modules accepted: Orders

## 2024-03-14 NOTE — Telephone Encounter (Signed)
 Patient name and DOB verified.   Patient states she thought for certain she had been on the 1.5 mg before. She was given the doses that she had previously taken since beginning this medication and verbalized understanding.   She will go back to Semaglutide  1 mg because her blood sugars dropped too low at 2mg .   Reminded patient of her next appt. Verbalized understanding. Advised to call office if she had any issues or concerns prior to next appt.

## 2024-03-19 ENCOUNTER — Other Ambulatory Visit: Payer: Self-pay

## 2024-03-19 ENCOUNTER — Ambulatory Visit: Payer: Self-pay

## 2024-03-19 NOTE — Telephone Encounter (Signed)
'  Call cannot be completed as dialed' Will forward to office for further follow up.

## 2024-03-19 NOTE — Telephone Encounter (Signed)
 Copied from CRM [id number]. Topic: Clinical - Medication Refill >> Mar 19, 2024  8:23 AM Annelle Kiel wrote: Medication: cough meds   Has the patient contacted their pharmacy? No (Agent: If no, request that the patient contact the pharmacy for the refill. If patient does not wish to contact the pharmacy document the reason why and proceed with request.) (Agent: If yes, when and what did the pharmacy advise?)  This is the patient's preferred pharmacy:  [facility] [address] [place] [address] Phone: [phone] Fax: [phone]  [facility] [address] [place] [address] Phone: [phone] Fax: [phone]  Is this the correct pharmacy for this prescription? Yes If no, delete pharmacy and type the correct one.   Has the prescription been filled recently? No  Is the patient out of the medication? Yes  Has the patient been seen for an appointment in the last year OR does the patient have an upcoming appointment? Yes  Can we respond through MyChart? No  Agent: Please be advised that Rx refills may take up to 3 business days. We ask that you follow-up with your pharmacy.

## 2024-03-20 ENCOUNTER — Other Ambulatory Visit: Payer: Self-pay | Admitting: Internal Medicine

## 2024-03-20 ENCOUNTER — Other Ambulatory Visit: Payer: Self-pay

## 2024-03-20 MED ORDER — BENZONATATE 100 MG PO CAPS
100.0000 mg | ORAL_CAPSULE | Freq: Three times a day (TID) | ORAL | 0 refills | Status: DC | PRN
  Filled 2024-03-20: qty 20, 7d supply, fill #0

## 2024-03-20 NOTE — Telephone Encounter (Signed)
 Copied from CRM [id number]. Topic: Clinical - Medication Refill >> Mar 19, 2024  8:23 AM Annelle Kiel wrote:  Medication: cough meds   Has the patient contacted their pharmacy? No (Agent: If no, request that the patient contact the pharmacy for the refill. If patient does not wish to contact the pharmacy document the reason why and proceed with request.) (Agent: If yes, when and what did the pharmacy advise?)  This is the patient's preferred pharmacy:  [facility] - [facility] [address], [address] [place] [address] Phone: [phone] Fax: [phone]  [facility] - [facility] [address] [place] [address] Phone: [phone] Fax: [phone]  Is this the correct pharmacy for this prescription? Yes If no, delete pharmacy and type the correct one.   Has the prescription been filled recently? No  Is the patient out of the medication? Yes  Has the patient been seen for an appointment in the last year OR does the patient have an upcoming appointment? Yes  Can we respond through MyChart? No  Agent: Please be advised that Rx refills may take up to 3 business days. We ask that you follow-up with your pharmacy. >> Mar 19, 2024  6:03 PM DeAngela L wrote: Pt called to check the status after checking her MyChart messages and asked questions about messages left by the RN Informed  her medication the refill  has been forwarded to the office and the patient states her Preferred pharmacy is a delivery pharmacy  [facility] [address], [address] [place] [address] Phone: [phone] Fax: [phone]

## 2024-03-23 DIAGNOSIS — R0602 Shortness of breath: Secondary | ICD-10-CM | POA: Diagnosis not present

## 2024-04-01 ENCOUNTER — Other Ambulatory Visit (HOSPITAL_COMMUNITY): Payer: Self-pay

## 2024-04-08 ENCOUNTER — Ambulatory Visit: Payer: 59 | Admitting: Internal Medicine

## 2024-04-20 ENCOUNTER — Other Ambulatory Visit: Payer: Self-pay | Admitting: Internal Medicine

## 2024-04-20 DIAGNOSIS — E1169 Type 2 diabetes mellitus with other specified complication: Secondary | ICD-10-CM

## 2024-04-22 ENCOUNTER — Other Ambulatory Visit (HOSPITAL_COMMUNITY): Payer: Self-pay

## 2024-04-22 MED ORDER — OZEMPIC (1 MG/DOSE) 4 MG/3ML ~~LOC~~ SOPN
1.0000 mg | PEN_INJECTOR | SUBCUTANEOUS | 2 refills | Status: DC
  Filled 2024-04-22: qty 3, 28d supply, fill #0
  Filled 2024-05-16: qty 3, 28d supply, fill #1
  Filled 2024-06-09: qty 3, 28d supply, fill #2

## 2024-04-23 ENCOUNTER — Other Ambulatory Visit (HOSPITAL_COMMUNITY): Payer: Self-pay

## 2024-04-23 DIAGNOSIS — R0602 Shortness of breath: Secondary | ICD-10-CM | POA: Diagnosis not present

## 2024-05-09 ENCOUNTER — Other Ambulatory Visit: Payer: Self-pay | Admitting: Emergency Medicine

## 2024-05-09 ENCOUNTER — Other Ambulatory Visit: Payer: Self-pay

## 2024-05-09 DIAGNOSIS — Z87891 Personal history of nicotine dependence: Secondary | ICD-10-CM

## 2024-05-09 DIAGNOSIS — Z122 Encounter for screening for malignant neoplasm of respiratory organs: Secondary | ICD-10-CM

## 2024-05-16 ENCOUNTER — Other Ambulatory Visit: Payer: Self-pay

## 2024-05-17 ENCOUNTER — Telehealth: Payer: Self-pay | Admitting: Internal Medicine

## 2024-05-17 NOTE — Telephone Encounter (Signed)
 Called patient, no answer. Left voicemail confirming upcoming appointment on 05/21/2024 at 9:50 am with Dr. Vicci. Provided callback number for any questions or changes.

## 2024-05-21 ENCOUNTER — Encounter: Payer: Self-pay | Admitting: Internal Medicine

## 2024-05-21 ENCOUNTER — Ambulatory Visit: Attending: Internal Medicine | Admitting: Internal Medicine

## 2024-05-21 ENCOUNTER — Other Ambulatory Visit (HOSPITAL_COMMUNITY): Payer: Self-pay

## 2024-05-21 ENCOUNTER — Other Ambulatory Visit: Payer: Self-pay

## 2024-05-21 DIAGNOSIS — I10 Essential (primary) hypertension: Secondary | ICD-10-CM | POA: Diagnosis not present

## 2024-05-21 DIAGNOSIS — L304 Erythema intertrigo: Secondary | ICD-10-CM

## 2024-05-21 DIAGNOSIS — E1169 Type 2 diabetes mellitus with other specified complication: Secondary | ICD-10-CM | POA: Diagnosis not present

## 2024-05-21 DIAGNOSIS — Z7985 Long-term (current) use of injectable non-insulin antidiabetic drugs: Secondary | ICD-10-CM

## 2024-05-21 DIAGNOSIS — E1159 Type 2 diabetes mellitus with other circulatory complications: Secondary | ICD-10-CM | POA: Diagnosis not present

## 2024-05-21 DIAGNOSIS — E785 Hyperlipidemia, unspecified: Secondary | ICD-10-CM | POA: Diagnosis not present

## 2024-05-21 DIAGNOSIS — M67431 Ganglion, right wrist: Secondary | ICD-10-CM

## 2024-05-21 DIAGNOSIS — N1832 Chronic kidney disease, stage 3b: Secondary | ICD-10-CM | POA: Diagnosis not present

## 2024-05-21 DIAGNOSIS — Z6837 Body mass index (BMI) 37.0-37.9, adult: Secondary | ICD-10-CM

## 2024-05-21 DIAGNOSIS — E119 Type 2 diabetes mellitus without complications: Secondary | ICD-10-CM | POA: Diagnosis not present

## 2024-05-21 DIAGNOSIS — I152 Hypertension secondary to endocrine disorders: Secondary | ICD-10-CM

## 2024-05-21 LAB — POCT GLYCOSYLATED HEMOGLOBIN (HGB A1C): HbA1c, POC (controlled diabetic range): 5.6 % (ref 0.0–7.0)

## 2024-05-21 LAB — GLUCOSE, POCT (MANUAL RESULT ENTRY): POC Glucose: 104 mg/dL — AB (ref 70–99)

## 2024-05-21 MED ORDER — NYSTATIN 100000 UNIT/GM EX POWD
1.0000 | Freq: Two times a day (BID) | CUTANEOUS | 0 refills | Status: DC
  Filled 2024-05-21 (×4): qty 15, 8d supply, fill #0

## 2024-05-21 NOTE — Patient Instructions (Signed)
 VISIT SUMMARY:  During your visit today, we discussed several health concerns including a mass on your right wrist, a knot behind your left ear, an itchy rash under your left breast, and your ongoing management of diabetes, hypertension, and cholesterol. We also reviewed your kidney function and general health maintenance.  YOUR PLAN:  -POSSIBLE YEAST INFECTION UNDER LEFT BREAST: This is likely a yeast infection caused by a moist environment. You should continue using the over-the-counter treatments that have been helping, and I am prescribing an antifungal powder to use as needed. Keep the area clean and dry, and ensure proper bra support.  -RIGHT WRIST BAKER'S CYST: A Baker's cyst is a fluid-filled swelling that causes a lump behind the knee, but in your case, it is on your wrist. It is benign and not causing any symptoms, so we will monitor it for any changes. If it becomes bothersome or enlarges, we may consider a referral to an orthopedic specialist.  -TYPE 2 DIABETES MELLITUS: Your diabetes is well-controlled with an A1c of 5.6 and a blood sugar level of 104. Continue taking Ozempic  at 1 mg and try to monitor your blood sugar levels regularly.  -HYPERTENSION: Your blood pressure is currently low at 90/65, which may be due to your recent weight loss. We will recheck your blood pressure today and you should monitor it at home as well.  -HYPERLIPIDEMIA: This condition involves high levels of fats in your blood. Continue taking your current medications: Repatha , atorvastatin , and Zetia .  -CHRONIC KIDNEY DISEASE: Your kidney function is stable but not optimal. We will do a blood draw and a urine test to reassess your kidney function.  -GENERAL HEALTH MAINTENANCE: You declined the shingles vaccine, and you have a diabetic eye exam scheduled for August 5th. Please make sure to complete this exam.  INSTRUCTIONS:  Please follow up as discussed for your conditions and screenings. Make sure to  monitor your blood pressure at home and keep track of your blood sugar levels regularly. Attend your diabetic eye exam on August 5th. We will also recheck your kidney function with a blood draw and urine test.

## 2024-05-21 NOTE — Progress Notes (Signed)
 Patient ID: Krystal Allen, female    DOB: 1956-08-25  MRN: [id number]  CC: Diabetes (DM f/u. Cathi on R wrist/Mass behind L ear/Rash under L breast, itchy, spreading X1 week/No shingles vax. )   Subjective: Krystal Allen is a 68 y.o. female who presents for chronic ds management. Her concerns today include:  Pt with hx of HTN, HL, aortic atherosclerosis, COPD, ILD (pulmonary Langerhans' cell cell histiocytosis), nocturnal hypoxia followed by pulmonary, GERD, CKD 2-3, DM/obesity, former tob dep, DJD c-spine, spinal stenosis, stress incont, adenomatous colon polyp.     Discussed the use of AI scribe software for clinical note transcription with the patient, who gave verbal consent to proceed.  History of Present Illness Krystal Allen is a 68 year old female with diabetes who presents for a follow-up visit.  She has a mass on her right wrist that appeared this month, which is non-painful. Additionally, she thinks she feels something behind her left ear, which she associates with an ear infection. No ear pain or drainage is present.  She has an itchy rash under her left breast for about two weeks. She has been treating it with alcohol, peroxide, and an over-the-counter cream, noting improvement and reduced itchiness.  DM: Results for orders placed or performed in visit on 05/21/24  POCT glucose (manual entry)   Collection Time: 05/21/24 10:06 AM  Result Value Ref Range   POC Glucose 104 (A) 70 - 99 mg/dl  POCT glycosylated hemoglobin (Hb A1C)   Collection Time: 05/21/24 10:12 AM  Result Value Ref Range   Hemoglobin A1C     HbA1c POC (<> result, manual entry)     HbA1c, POC (prediabetic range)     HbA1c, POC (controlled diabetic range) 5.6 0.0 - 7.0 %  Her diabetes management includes Ozempic , reduced to 1 mg due to low blood sugar levels on the 2 mg dose. Blood sugars have been stable, though not checked regularly. She reports decreased appetite and weight loss from 261 pounds in  June of last year to 230 pounds currently.  HTN: She is on hydrochlorothiazide  25 mg, Cozaar  25 mg, and furosemide  as needed. She has not been monitoring her blood pressure at home but denies headaches or dizziness.  HL/aortic atherosclerosis: She continues to take Repatha , atorvastatin , and Zetia  for cholesterol management.   CKD: GFR has been stable in the upper 130s.  Last level was 39. She cannot recall her last visit to the kidney specialist but believes she has an upcoming appointment.  She is concerned she may be anemic.  She mentions a craving for starch, specifically [place] starch, and has been consuming cornstarch. Her last blood count in February showed no anemia.  HM: She declines shingles vaccine.  Diabetic eye exam scheduled for August 5.    Patient Active Problem List   Diagnosis Date Noted   Chronic hypoxic respiratory failure, on home oxygen  therapy (HCC) 12/08/2023   Herpes zoster vaccination declined 12/08/2023   Anxiety 04/03/2023   History of panic attacks 04/03/2023   Ventral hernia 04/03/2023   Chemosis of conjunctiva, bilateral 02/27/2023   Type 2 diabetes mellitus with chronic kidney disease, without long-term current use of insulin  (HCC) 02/27/2023   CKD stage 3b, GFR 30-44 ml/min (HCC) - Baseline Scr 1.5 02/27/2023   Morbid obesity (HCC) 03/09/2022   Seasonal allergies 02/07/2022   Mastitis, right, acute 12/23/2021   Influenza vaccine needed 09/14/2020   Tobacco dependence 05/12/2020   Aortic atherosclerosis (HCC) 05/12/2020  Adenomatous polyp of colon 05/12/2020   Langerhan's cell histiocytosis (HCC) 01/26/2018   Cigarette smoker 01/26/2018   Osteoarthritis of cervical spine 12/22/2017   Prediabetes 12/22/2017   Pain of left calf 04/06/2017   Centrilobular emphysema (HCC) 02/23/2016   Obesity (BMI 30-39.9) 01/15/2016   Bilateral shoulder pain 01/15/2016   Esophageal reflux 01/15/2016   Tobacco abuse 05/13/2015   Lung nodule 08/14/2014   Impaired  fasting glucose 08/14/2014   Hyperlipidemia 08/14/2014   Hx of adenomatous colonic polyps 06/25/2013   Hypertension associated with type 2 diabetes mellitus (HCC) 05/20/2013     Current Outpatient Medications on File Prior to Visit  Medication Sig Dispense Refill   Accu-Chek Softclix Lancets lancets Use to check blood sugar three times daily. E11.69 100 each 6   acetaminophen  (TYLENOL  8 HOUR) 650 MG CR tablet Take 1 tablet (650 mg total) by mouth 2 (two) times daily as needed for pain. 60 tablet 5   albuterol  (PROAIR  HFA) 108 (90 Base) MCG/ACT inhaler Inhale 2 puffs into the lungs every 4 (four) hours as needed for wheezing or shortness of breath. 6.7 g 0   albuterol  (PROVENTIL ) (2.5 MG/3ML) 0.083% nebulizer solution TAKE 3 MLS BY NEBULIZATION EVERY 6 (SIX) HOURS AS NEEDED FOR WHEEZING OR SHORTNESS OF BREATH. 90 mL 1   aspirin  EC 81 MG tablet Take 1 tablet (81 mg total) by mouth daily. 100 tablet 1   atorvastatin  (LIPITOR) 80 MG tablet Take 1 tablet (80 mg total) by mouth daily. 90 tablet 3   benzonatate  (TESSALON  PERLES) 100 MG capsule Take 1 capsule (100 mg total) by mouth 3 (three) times daily as needed. 20 capsule 0   Blood Glucose Monitoring Suppl (ACCU-CHEK GUIDE) w/Device KIT Use to check blood sugar three times daily. E11.69 1 kit 0   Continuous Glucose Receiver (FREESTYLE LIBRE 2 READER) DEVI Use to check blood sugar continuously throughout the day. Change sensors once every 14 days. 1 each 0   Continuous Glucose Sensor (FREESTYLE LIBRE 2 SENSOR) MISC Use to check blood sugar continuously throughout the day. 2 each 6   diphenhydrAMINE  (BENADRYL ) 25 mg capsule Take 25-50 mg by mouth every 6 (six) hours as needed for itching or allergies.     Elastic Bandages & Supports (MEDICAL COMPRESSION STOCKINGS) MISC R60.0 1 each 0   empagliflozin  (JARDIANCE ) 10 MG TABS tablet Take 1 tablet (10 mg total) by mouth daily before breakfast. 90 tablet 3   EPINEPHrine  0.3 mg/0.3 mL IJ SOAJ injection  Inject 0.3 mg into the muscle as needed for anaphylaxis. 2 each 2   Evolocumab  (REPATHA  SURECLICK) 140 MG/ML SOAJ Inject 280 mg into the skin every 14 (fourteen) days. 6 mL 3   ezetimibe  (ZETIA ) 10 MG tablet Take 1 tablet (10 mg total) by mouth daily. 90 tablet 2   famotidine  (PEPCID ) 20 MG tablet Take 1 tablet (20 mg total) by mouth 2 (two) times daily as needed (hives). 60 tablet 11   fexofenadine  (ALLEGRA  ALLERGY ) 180 MG tablet Take 1 tablet (180 mg total) by mouth 2 (two) times daily as needed (hives or allergies). 60 tablet 11   fluticasone  (FLONASE ) 50 MCG/ACT nasal spray Place 2 sprays into both nostrils daily as needed for allergies. 16 g 11   fluticasone  furoate-vilanterol (BREO ELLIPTA ) 200-25 MCG/ACT AEPB Inhale 1 puff into the lungs once daily. 60 each 11   furosemide  (LASIX ) 20 MG tablet Take 1 tablet (20 mg total) by mouth daily as needed (for weight gain of 2-3 lbs overnight  or swelling in legs/ankles). 90 tablet 0   glucose blood (ACCU-CHEK GUIDE) test strip Use to check blood sugar three times daily. E11.69 100 each 12   hydrochlorothiazide  (HYDRODIURIL ) 25 MG tablet Take 1 tablet (25 mg total) by mouth daily. 90 tablet 1   Insulin  Pen Needle 32G X 4 MM MISC Use 4 times daily with insulin  pen. 100 each 0   losartan  (COZAAR ) 25 MG tablet Take 1 tablet (25 mg total) by mouth daily. 90 tablet 3   Olopatadine  HCl 0.2 % SOLN Place 1 drop into both eyes daily as needed (allergies). 2.5 mL 5   omeprazole  (PRILOSEC) 20 MG capsule Take 1 capsule (20 mg total) by mouth daily. 90 capsule 3   oxybutynin  (DITROPAN  XL) 5 MG 24 hr tablet Take 1 tablet (5 mg total) by mouth at bedtime. 30 tablet 0   oxyCODONE  (OXY IR/ROXICODONE ) 5 MG immediate release tablet Take 1 tablet (5 mg total) by mouth every 6 (six) hours as needed for severe pain (pain score 7-10). 20 tablet 0   OXYGEN  Inhale 2 L/min into the lungs as needed.     potassium chloride  SA (KLOR-CON  M) 20 MEQ tablet Take 1 tablet (20 mEq total)  by mouth 2 (two) times daily. 180 tablet 2   Respiratory Therapy Supplies (FLUTTER) DEVI Use as directed 1 each 0   Semaglutide , 1 MG/DOSE, (OZEMPIC , 1 MG/DOSE,) 4 MG/3ML SOPN Inject 1 mg as directed once a week. 3 mL 2   sodium chloride  (OCEAN) 0.65 % nasal spray Place 2 sprays into both nostrils every 4 (four) hours. 44 mL 0   Tiotropium Bromide  Monohydrate (SPIRIVA  RESPIMAT) 2.5 MCG/ACT AERS Inhale 2 puffs into the lungs daily. 4 g 11   triamcinolone  cream (KENALOG ) 0.1 % Apply 1 Application topically 2 (two) times daily. 30 g 0   No current facility-administered medications on file prior to visit.    Allergies  Allergen Reactions   Sulfa Antibiotics Anaphylaxis and Hives   Chantix  [Varenicline  Tartrate]     Bad dreams   Fish Allergy  Other (See Comments)    Severe stomach cramps    Hygroton  [Chlorthalidone ]     Swelling or hives   Penicillins Hives    Pt took Amoxicillin  in 2019 without issue, reports PCN caused rash in the past   Latex Rash and Other (See Comments)    Dry skin    Social History   Socioeconomic History   Marital status: Legally Separated    Spouse name: Not on file   Number of children: Not on file   Years of education: 12+   Highest education level: Not on file  Occupational History   Occupation: PCA    Employer: [employer]  Tobacco Use   Smoking status: Former    Current packs/day: 0.00    Average packs/day: 0.5 packs/day for 40.0 years (20.0 ttl pk-yrs)    Types: Cigarettes    Start date: 07/1981    Quit date: 07/2021    Years since quitting: 2.8    Passive exposure: Never   Smokeless tobacco: Never   Tobacco comments:    Quit [id number]  Vaping Use   Vaping status: Former  Substance and Sexual Activity   Alcohol use: Yes    Comment: occasional   Drug use: No   Sexual activity: Not Currently  Other Topics Concern   Not on file  Social History Narrative   Regular exercise-no Caffeine Use-   Social Drivers of Dispensing optician  Resource  Strain: Low Risk  (12/08/2023)   Overall Financial Resource Strain (CARDIA)    Difficulty of Paying Living Expenses: Not very hard  Food Insecurity: No Food Insecurity (12/08/2023)   Hunger Vital Sign    Worried About Running Out of Food in the Last Year: Never true    Ran Out of Food in the Last Year: Never true  Transportation Needs: No Transportation Needs (12/08/2023)   PRAPARE - Administrator, Civil Service (Medical): No    Lack of Transportation (Non-Medical): No  Physical Activity: Insufficiently Active (12/08/2023)   Exercise Vital Sign    Days of Exercise per Week: 1 day    Minutes of Exercise per Session: 10 min  Stress: No Stress Concern Present (12/08/2023)   [employer] of Occupational Health - Occupational Stress Questionnaire    Feeling of Stress : Not at all  Social Connections: Moderately Isolated (12/08/2023)   Social Connection and Isolation Panel    Frequency of Communication with Friends and Family: More than three times a week    Frequency of Social Gatherings with Friends and Family: More than three times a week    Attends Religious Services: Never    Database administrator or Organizations: Yes    Attends Banker Meetings: Never    Marital Status: Separated  Intimate Partner Violence: Not At Risk (12/08/2023)   Humiliation, Afraid, Rape, and Kick questionnaire    Fear of Current or Ex-Partner: No    Emotionally Abused: No    Physically Abused: No    Sexually Abused: No    Family History  Problem Relation Age of Onset   Cancer Father        prostate   Hypertension Mother    Diabetes Mother    Hypertension Sister    Heart disease Maternal Uncle    Stroke Maternal Grandmother    Hypertension Maternal Grandmother    Hypertension Sister    Colon cancer Neg Hx    Stomach cancer Neg Hx    Rectal cancer Neg Hx    Colon polyps Neg Hx     Past Surgical History:  Procedure Laterality Date   ABDOMINAL HYSTERECTOMY  73yrs ago   due to  heavy bleeding and fibroids    BREAST CYST EXCISION Right 01/03/2024   Procedure: EXCISION OF RIGHT NIPPLE CYST;  Surgeon: Belinda Cough, MD;  Location: [facility] OR;  Service: General;  Laterality: Right;   COLONOSCOPY     COLONOSCOPY WITH PROPOFOL  N/A 11/23/2023   Procedure: COLONOSCOPY WITH PROPOFOL ;  Surgeon: Avram Lupita BRAVO, MD;  Location: [facility];  Service: Gastroenterology;  Laterality: N/A;   EPIGASTRIC HERNIA REPAIR N/A 01/03/2024   Procedure: ABDOMINAL WOUND EXPLORATION;  Surgeon: Belinda Cough, MD;  Location: [facility] OR;  Service: General;  Laterality: N/A;  90 MINUTES LMA   HEMORRHOID BANDING  2014   HEMORRHOID SURGERY  86yrs agao   POLYPECTOMY  11/23/2023   Procedure: POLYPECTOMY;  Surgeon: Avram Lupita BRAVO, MD;  Location: [facility];  Service: Gastroenterology;;   RIGHT HEART CATH N/A 10/12/2022   Procedure: RIGHT HEART CATH;  Surgeon: Wendel Lurena POUR, MD;  [facility] INV[facility] CV [facility];  Service: Cardiovascular;  Laterality: N/A;   UMBILICAL HERNIA REPAIR  06/2015    ROS: Review of Systems Negative except as stated above  PHYSICAL EXAM: BP 99/65 (BP Location: Left Arm, Patient Position: Sitting, Cuff Size: Large)   Pulse 63   Ht 5' 6 (1.676 m)   Wt  230 lb (104.3 kg)   SpO2 95%   BMI 37.12 kg/m   Wt Readings from Last 3 Encounters:  05/21/24 230 lb (104.3 kg)  01/23/24 238 lb (108 kg)  01/17/24 237 lb 14.4 oz (107.9 kg)    Physical Exam  General appearance - alert, well appearing, and in no distress Mental status - normal mood, behavior, speech, dress, motor activity, and thought processes Ears - bilateral TM's and external ear canals normal Neck - supple, no significant adenopathy Chest - clear to auscultation, no wheezes, rales or rhonchi, symmetric air entry Heart - normal rate, regular rhythm, normal S1, S2, no murmurs, rubs, clicks or gallops Musculoskeletal - LT wrist: 1-2 cm raised Bakers cyst on dorsal surface      Latest Ref Rng & Units 12/28/2023    9:21  AM 09/11/2023    8:51 AM 08/04/2023   10:27 AM  CMP  Glucose 70 - 99 mg/dL 85  80  96   BUN 8 - 23 mg/dL 19  18  18    Creatinine 0.44 - 1.00 mg/dL 8.53  8.51  8.58   Sodium 135 - 145 mmol/L 140  141  141   Potassium 3.5 - 5.1 mmol/L 3.0  3.6  3.3   Chloride 98 - 111 mmol/L 103  100  101   CO2 22 - 32 mmol/L 27  28  26    Calcium  8.9 - 10.3 mg/dL 8.7  9.3  9.1    Lipid Panel     Component Value Date/Time   CHOL 104 12/02/2022 0909   TRIG 102 12/02/2022 0909   HDL 52 12/02/2022 0909   CHOLHDL 2.0 12/02/2022 0909   CHOLHDL 3.7 08/17/2021 0340   VLDL 12 08/17/2021 0340   LDLCALC 33 12/02/2022 0909   LDLDIRECT 190.1 04/16/2013 1531    CBC    Component Value Date/Time   WBC 5.6 12/28/2023 0921   RBC 4.76 12/28/2023 0921   HGB 13.3 12/28/2023 0921   HGB 14.4 10/03/2022 1120   HCT 41.4 12/28/2023 0921   HCT 41.8 10/03/2022 1120   PLT 272 12/28/2023 0921   PLT 260 10/03/2022 1120   MCV 87.0 12/28/2023 0921   MCV 85 10/03/2022 1120   MCH 27.9 12/28/2023 0921   MCHC 32.1 12/28/2023 0921   RDW 18.9 (H) 12/28/2023 0921   RDW 17.9 (H) 10/03/2022 1120   LYMPHSABS 1.9 02/26/2023 1849   LYMPHSABS 2.2 02/10/2020 1000   MONOABS 0.3 02/26/2023 1849   EOSABS 0.0 02/26/2023 1849   EOSABS 0.2 02/10/2020 1000   BASOSABS 0.0 02/26/2023 1849   BASOSABS 0.1 02/10/2020 1000    ASSESSMENT AND PLAN: 1. Type 2 diabetes mellitus with morbid obesity (HCC) (Primary) Patient with diagnosis of morbid obesity and diabetes associated with hypertension and hyperlipidemia.  She has done well so far on Ozempic .  We will continue the 1 mg once a week. Continue try to eat healthy - POCT glycosylated hemoglobin (Hb A1C) - POCT glucose (manual entry) - CBC - Microalbumin / creatinine urine ratio  2. Long-term (current) use of injectable non-insulin  antidiabetic drugs See # 1 above  3. Hypertension associated with type 2 diabetes mellitus (HCC) At goal.  Continue Cozaar  20 mg daily, HCTZ 25 mg daily  and furosemide  as needed.  4. Hyperlipidemia due to type 2 diabetes mellitus (HCC) Continue Lipitor, Zetia  and Repatha .  5. Stage 3b chronic kidney disease (HCC) Stable.  Continue to avoid NSAIDs. - Basic Metabolic Panel - Microalbumin / creatinine urine ratio  6.  Ganglion cyst RT wrist Advised patient that this is a Baker's cyst. Would send to ortho for removal.  Patient decided to observe for now since and it is not bothersome to her.  7. Intertrigo Advised to keep skin under the breasts clean and dry.  Wear good support bra.  Prescription sent for some nystatin  powder.  Advised to stop eating cornstarch.  We will check CBC today  Patient was given the opportunity to ask questions.  Patient verbalized understanding of the plan and was able to repeat key elements of the plan.   This documentation was completed using Paediatric nurse.  Any transcriptional errors are unintentional.  Orders Placed This Encounter  Procedures   CBC   Basic Metabolic Panel   Microalbumin / creatinine urine ratio   POCT glycosylated hemoglobin (Hb A1C)   POCT glucose (manual entry)     Requested Prescriptions   Signed Prescriptions Disp Refills   nystatin  (MYCOSTATIN /NYSTOP ) powder 15 g 0    Sig: Apply 1 Application topically 2 (two) times daily.    Return in about 4 months (around 09/21/2024).  Barnie Louder, MD, FACP

## 2024-05-22 ENCOUNTER — Ambulatory Visit: Payer: Self-pay | Admitting: Internal Medicine

## 2024-05-22 ENCOUNTER — Other Ambulatory Visit (HOSPITAL_COMMUNITY): Payer: Self-pay

## 2024-05-22 ENCOUNTER — Other Ambulatory Visit: Payer: Self-pay

## 2024-05-22 MED ORDER — POTASSIUM CHLORIDE CRYS ER 20 MEQ PO TBCR
EXTENDED_RELEASE_TABLET | ORAL | 2 refills | Status: AC
  Filled 2024-05-22: qty 270, 90d supply, fill #0
  Filled 2024-12-04: qty 270, 90d supply, fill #1

## 2024-05-22 NOTE — Telephone Encounter (Signed)
-----   Message from [facility] Clarisa A sent at 05/22/2024  2:58 PM EDT ----- Called & spoke to the patient. Verified name & DOB. Informed of results. Patient confirmed that she has been taking the potassium supplement 20 mEq twice a day. Informed that per Dr.Alexzavier Girardin to  increase it to 2 tablets in the morning and one in the evening. Patient expressed verbal understanding of all discussed. ----- Message ----- From: Lenon Elspeth CROME Sent: 05/22/2024  11:29 AM EDT To: [facility]  ----- Message from Elspeth CROME Lenon sent at 05/22/2024 11:29 AM EDT -----  Patient returned Dr. Ferdie call to get lab results.  Please return her call at [phone].  Thanks.

## 2024-05-23 DIAGNOSIS — R0602 Shortness of breath: Secondary | ICD-10-CM | POA: Diagnosis not present

## 2024-05-23 LAB — CBC
Hematocrit: 42 % (ref 34.0–46.6)
Hemoglobin: 13.2 g/dL (ref 11.1–15.9)
MCH: 26.1 pg — ABNORMAL LOW (ref 26.6–33.0)
MCHC: 31.4 g/dL — ABNORMAL LOW (ref 31.5–35.7)
MCV: 83 fL (ref 79–97)
Platelets: 268 x10E3/uL (ref 150–450)
RBC: 5.05 x10E6/uL (ref 3.77–5.28)
RDW: 16.7 % — ABNORMAL HIGH (ref 11.7–15.4)
WBC: 5.2 x10E3/uL (ref 3.4–10.8)

## 2024-05-23 LAB — MICROALBUMIN / CREATININE URINE RATIO
Creatinine, Urine: 184.2 mg/dL
Microalb/Creat Ratio: 9 mg/g{creat} (ref 0–29)
Microalbumin, Urine: 16.9 ug/mL

## 2024-05-23 LAB — BASIC METABOLIC PANEL WITH GFR
BUN/Creatinine Ratio: 14 (ref 12–28)
BUN: 23 mg/dL (ref 8–27)
CO2: 23 mmol/L (ref 20–29)
Calcium: 9.4 mg/dL (ref 8.7–10.3)
Chloride: 98 mmol/L (ref 96–106)
Creatinine, Ser: 1.59 mg/dL — ABNORMAL HIGH (ref 0.57–1.00)
Glucose: 87 mg/dL (ref 70–99)
Potassium: 3.3 mmol/L — ABNORMAL LOW (ref 3.5–5.2)
Sodium: 140 mmol/L (ref 134–144)
eGFR: 35 mL/min/1.73 — ABNORMAL LOW (ref >59)

## 2024-05-27 ENCOUNTER — Ambulatory Visit (HOSPITAL_BASED_OUTPATIENT_CLINIC_OR_DEPARTMENT_OTHER)
Admission: RE | Admit: 2024-05-27 | Discharge: 2024-05-27 | Disposition: A | Discharge status: Home / Self Care | Source: Ambulatory Visit | Attending: Acute Care | Admitting: Acute Care

## 2024-05-27 DIAGNOSIS — Z87891 Personal history of nicotine dependence: Secondary | ICD-10-CM | POA: Diagnosis not present

## 2024-05-27 DIAGNOSIS — Z122 Encounter for screening for malignant neoplasm of respiratory organs: Secondary | ICD-10-CM | POA: Diagnosis not present

## 2024-06-03 ENCOUNTER — Telehealth: Payer: Self-pay

## 2024-06-03 NOTE — Telephone Encounter (Signed)
 Copied from CRM [id number]. Topic: Clinical - Medical Advice >> Jun 03, 2024  3:22 PM Emylou G wrote: Reason for CRM: patient called.. said nystatin  (MYCOSTATIN /NYSTOP ) powder is not working.SABRA it's still itching.. can she get an alternative?

## 2024-06-04 ENCOUNTER — Other Ambulatory Visit: Payer: Self-pay

## 2024-06-04 DIAGNOSIS — H524 Presbyopia: Secondary | ICD-10-CM | POA: Diagnosis not present

## 2024-06-04 DIAGNOSIS — H2513 Age-related nuclear cataract, bilateral: Secondary | ICD-10-CM | POA: Diagnosis not present

## 2024-06-04 DIAGNOSIS — H5203 Hypermetropia, bilateral: Secondary | ICD-10-CM | POA: Diagnosis not present

## 2024-06-04 DIAGNOSIS — H43822 Vitreomacular adhesion, left eye: Secondary | ICD-10-CM | POA: Diagnosis not present

## 2024-06-04 DIAGNOSIS — H52223 Regular astigmatism, bilateral: Secondary | ICD-10-CM | POA: Diagnosis not present

## 2024-06-04 LAB — HM DIABETES EYE EXAM

## 2024-06-04 MED ORDER — CLOTRIMAZOLE 1 % EX CREA
1.0000 | TOPICAL_CREAM | Freq: Two times a day (BID) | CUTANEOUS | 0 refills | Status: DC
  Filled 2024-06-04 – 2024-06-07 (×2): qty 60, 30d supply, fill #0

## 2024-06-04 NOTE — Addendum Note (Signed)
 Addended by: Markasia Carrol on: 06/04/2024 01:08 PM   Modules accepted: Orders

## 2024-06-04 NOTE — Telephone Encounter (Signed)
I have sent a prescription for clotrimazole to her pharmacy

## 2024-06-04 NOTE — Telephone Encounter (Signed)
 Called but no answer. LVM informing of prescription sent to preferred pharmacy.

## 2024-06-07 ENCOUNTER — Other Ambulatory Visit: Payer: Self-pay

## 2024-06-07 ENCOUNTER — Other Ambulatory Visit (HOSPITAL_COMMUNITY): Payer: Self-pay

## 2024-06-09 ENCOUNTER — Other Ambulatory Visit: Payer: Self-pay | Admitting: Internal Medicine

## 2024-06-10 ENCOUNTER — Other Ambulatory Visit: Payer: Self-pay

## 2024-06-10 ENCOUNTER — Other Ambulatory Visit: Payer: Self-pay | Admitting: Acute Care

## 2024-06-10 ENCOUNTER — Other Ambulatory Visit (HOSPITAL_COMMUNITY): Payer: Self-pay

## 2024-06-10 DIAGNOSIS — Z122 Encounter for screening for malignant neoplasm of respiratory organs: Secondary | ICD-10-CM

## 2024-06-10 DIAGNOSIS — Z87891 Personal history of nicotine dependence: Secondary | ICD-10-CM

## 2024-06-12 ENCOUNTER — Other Ambulatory Visit: Payer: Self-pay

## 2024-06-12 ENCOUNTER — Other Ambulatory Visit: Payer: 59

## 2024-06-12 ENCOUNTER — Other Ambulatory Visit (HOSPITAL_COMMUNITY): Payer: Self-pay

## 2024-06-12 ENCOUNTER — Other Ambulatory Visit (HOSPITAL_BASED_OUTPATIENT_CLINIC_OR_DEPARTMENT_OTHER)

## 2024-06-12 MED ORDER — EPINEPHRINE 0.3 MG/0.3ML IJ SOAJ
0.3000 mg | INTRAMUSCULAR | 2 refills | Status: AC | PRN
  Filled 2024-06-12: qty 2, 15d supply, fill #0

## 2024-06-13 ENCOUNTER — Other Ambulatory Visit: Payer: Self-pay

## 2024-06-13 ENCOUNTER — Other Ambulatory Visit: Payer: Self-pay | Admitting: Internal Medicine

## 2024-06-13 ENCOUNTER — Other Ambulatory Visit (HOSPITAL_COMMUNITY): Payer: Self-pay

## 2024-06-13 DIAGNOSIS — I10 Essential (primary) hypertension: Secondary | ICD-10-CM

## 2024-06-13 MED ORDER — HYDROCHLOROTHIAZIDE 25 MG PO TABS
25.0000 mg | ORAL_TABLET | Freq: Every day | ORAL | 1 refills | Status: DC
  Filled 2024-06-13: qty 90, 90d supply, fill #0

## 2024-06-15 ENCOUNTER — Other Ambulatory Visit: Payer: Self-pay | Admitting: Internal Medicine

## 2024-06-15 DIAGNOSIS — E1169 Type 2 diabetes mellitus with other specified complication: Secondary | ICD-10-CM

## 2024-06-17 ENCOUNTER — Other Ambulatory Visit (HOSPITAL_COMMUNITY): Payer: Self-pay

## 2024-06-17 ENCOUNTER — Other Ambulatory Visit: Payer: Self-pay

## 2024-06-17 MED ORDER — FREESTYLE LIBRE 2 SENSOR MISC
6 refills | Status: DC
  Filled 2024-06-17: qty 2, 28d supply, fill #0
  Filled 2024-08-03 – 2024-08-06 (×3): qty 2, 28d supply, fill #1

## 2024-06-18 ENCOUNTER — Other Ambulatory Visit: Payer: Self-pay

## 2024-06-25 ENCOUNTER — Ambulatory Visit: Payer: 59 | Attending: Internal Medicine

## 2024-06-25 VITALS — Ht 66.0 in | Wt 230.0 lb

## 2024-06-25 DIAGNOSIS — Z0000 Encounter for general adult medical examination without abnormal findings: Secondary | ICD-10-CM | POA: Diagnosis not present

## 2024-06-25 NOTE — Patient Instructions (Signed)
 Krystal Allen , Thank you for taking time out of your busy schedule to complete your Annual Wellness Visit with me. I enjoyed our conversation and look forward to speaking with you again next year. I, as well as your care team,  appreciate your ongoing commitment to your health goals. Please review the following plan we discussed and let me know if I can assist you in the future. Your Game plan/ To Do List    Referrals: If you haven't heard from the office you've been referred to, please reach out to them at the phone provided.   Follow up Visits: We will see or speak with you next year for your Next Medicare AWV with our clinical staff Have you seen your provider in the last 6 months (3 months if uncontrolled diabetes)? Yes  Clinician Recommendations:  Aim for 30 minutes of exercise or brisk walking, 6-8 glasses of water, and 5 servings of fruits and vegetables each day.       This is a list of the screenings recommended for you:  Health Maintenance  Topic Date Due   DEXA scan (bone density measurement)  Never done   COVID-19 Vaccine (4 - 2024-25 season) 07/02/2023   Flu Shot  05/31/2024   Zoster (Shingles) Vaccine (1 of 2) 08/21/2024*   DTaP/Tdap/Td vaccine (2 - Td or Tdap) 08/14/2024   Hemoglobin A1C  11/21/2024   Yearly kidney function blood test for diabetes  05/21/2025   Yearly kidney health urinalysis for diabetes  05/21/2025   Complete foot exam   05/21/2025   Screening for Lung Cancer  05/27/2025   Eye exam for diabetics  06/04/2025   Medicare Annual Wellness Visit  06/25/2025   Mammogram  11/14/2025   Colon Cancer Screening  11/22/2028   Pneumococcal Vaccine for age over 12  Completed   Hepatitis C Screening  Completed   HPV Vaccine  Aged Out   Meningitis B Vaccine  Aged Out  *Topic was postponed. The date shown is not the original due date.    Advanced directives: (Declined) Advance directive discussed with you today. Even though you declined this today, please call our  office should you change your mind, and we can give you the proper paperwork for you to fill out. Advance Care Planning is important because it:  [x]  Makes sure you receive the medical care that is consistent with your values, goals, and preferences  [x]  It provides guidance to your family and loved ones and reduces their decisional burden about whether or not they are making the right decisions based on your wishes.  Follow the link provided in your after visit summary or read over the paperwork we have mailed to you to help you started getting your Advance Directives in place. If you need assistance in completing these, please reach out to us  so that we can help you!  See attachments for Preventive Care and Fall Prevention Tips.

## 2024-06-25 NOTE — Progress Notes (Signed)
 Because this visit was a virtual/telehealth visit,  certain criteria was not obtained, such a blood pressure, CBG if applicable, and timed get up and go. Any medications not marked as taking were not mentioned during the medication reconciliation part of the visit. Any vitals not documented were not able to be obtained due to this being a telehealth visit or patient was unable to self-report a recent blood pressure reading due to a lack of equipment at home via telehealth. Vitals that have been documented are verbally provided by the patient.   Subjective:   Krystal Allen is a 68 y.o. who presents for a Medicare Wellness preventive visit.  As a reminder, Annual Wellness Visits don't include a physical exam, and some assessments may be limited, especially if this visit is performed virtually. We may recommend an in-person follow-up visit with your provider if needed.  Visit Complete: Virtual I connected with  Krystal Allen on 06/25/24 by a audio enabled telemedicine application and verified that I am speaking with the correct person using two identifiers.  Patient Location: Home  Provider Location: Office/Clinic  I discussed the limitations of evaluation and management by telemedicine. The patient expressed understanding and agreed to proceed.  Vital Signs: Because this visit was a virtual/telehealth visit, some criteria may be missing or patient reported. Any vitals not documented were not able to be obtained and vitals that have been documented are patient reported.  VideoDeclined- This patient declined Librarian, academic. Therefore the visit was completed with audio only.  Persons Participating in Visit: Patient.  AWV Questionnaire: No: Patient Medicare AWV questionnaire was not completed prior to this visit.  Cardiac Risk Factors include: advanced age (>44men, >92 women);sedentary lifestyle;diabetes mellitus;dyslipidemia;hypertension;obesity (BMI  >30kg/m2);family history of premature cardiovascular disease     Objective:    Today's Vitals   06/25/24 1153  Weight: 230 lb (104.3 kg)  Height: 5' 6 (1.676 m)  PainSc: 0-No pain   Body mass index is 37.12 kg/m.     06/25/2024   11:55 AM 01/03/2024    2:04 PM 12/28/2023    9:11 AM 11/23/2023    9:22 AM 06/20/2023   12:58 PM 02/26/2023    5:47 PM 10/12/2022   12:58 PM  Advanced Directives  Does Patient Have a Medical Advance Directive? No No No No No No No  Would patient like information on creating a medical advance directive? No - Patient declined No - Patient declined No - Patient declined No - Patient declined Yes (MAU/Ambulatory/Procedural Areas - Information given) No - Patient declined No - Patient declined    Current Medications (verified) Outpatient Encounter Medications as of 06/25/2024  Medication Sig   Accu-Chek Softclix Lancets lancets Use to check blood sugar three times daily. E11.69   acetaminophen  (TYLENOL  8 HOUR) 650 MG CR tablet Take 1 tablet (650 mg total) by mouth 2 (two) times daily as needed for pain.   albuterol  (PROAIR  HFA) 108 (90 Base) MCG/ACT inhaler Inhale 2 puffs into the lungs every 4 (four) hours as needed for wheezing or shortness of breath.   albuterol  (PROVENTIL ) (2.5 MG/3ML) 0.083% nebulizer solution TAKE 3 MLS BY NEBULIZATION EVERY 6 (SIX) HOURS AS NEEDED FOR WHEEZING OR SHORTNESS OF BREATH.   aspirin  EC 81 MG tablet Take 1 tablet (81 mg total) by mouth daily.   atorvastatin  (LIPITOR) 80 MG tablet Take 1 tablet (80 mg total) by mouth daily.   benzonatate  (TESSALON  PERLES) 100 MG capsule Take 1 capsule (100 mg  total) by mouth 3 (three) times daily as needed.   Blood Glucose Monitoring Suppl (ACCU-CHEK GUIDE) w/Device KIT Use to check blood sugar three times daily. E11.69   clotrimazole  (LOTRIMIN ) 1 % cream Apply 1 Application topically 2 (two) times daily.   Continuous Glucose Receiver (FREESTYLE LIBRE 2 READER) DEVI Use to check blood sugar  continuously throughout the day. Change sensors once every 14 days.   Continuous Glucose Sensor (FREESTYLE LIBRE 2 SENSOR) MISC Use to check blood sugar continuously throughout the day.   diphenhydrAMINE  (BENADRYL ) 25 mg capsule Take 25-50 mg by mouth every 6 (six) hours as needed for itching or allergies.   Elastic Bandages & Supports (MEDICAL COMPRESSION STOCKINGS) MISC R60.0   empagliflozin  (JARDIANCE ) 10 MG TABS tablet Take 1 tablet (10 mg total) by mouth daily before breakfast.   EPINEPHrine  0.3 mg/0.3 mL IJ SOAJ injection Inject 0.3 mg into the muscle as needed for anaphylaxis.   Evolocumab  (REPATHA  SURECLICK) 140 MG/ML SOAJ Inject 280 mg into the skin every 14 (fourteen) days.   ezetimibe  (ZETIA ) 10 MG tablet Take 1 tablet (10 mg total) by mouth daily.   famotidine  (PEPCID ) 20 MG tablet Take 1 tablet (20 mg total) by mouth 2 (two) times daily as needed (hives).   fexofenadine  (ALLEGRA  ALLERGY ) 180 MG tablet Take 1 tablet (180 mg total) by mouth 2 (two) times daily as needed (hives or allergies).   fluticasone  (FLONASE ) 50 MCG/ACT nasal spray Place 2 sprays into both nostrils daily as needed for allergies.   fluticasone  furoate-vilanterol (BREO ELLIPTA ) 200-25 MCG/ACT AEPB Inhale 1 puff into the lungs once daily.   furosemide  (LASIX ) 20 MG tablet Take 1 tablet (20 mg total) by mouth daily as needed (for weight gain of 2-3 lbs overnight or swelling in legs/ankles).   glucose blood (ACCU-CHEK GUIDE) test strip Use to check blood sugar three times daily. E11.69   hydrochlorothiazide  (HYDRODIURIL ) 25 MG tablet Take 1 tablet (25 mg total) by mouth daily.   Insulin  Pen Needle 32G X 4 MM MISC Use 4 times daily with insulin  pen.   losartan  (COZAAR ) 25 MG tablet Take 1 tablet (25 mg total) by mouth daily.   nystatin  (MYCOSTATIN /NYSTOP ) powder Apply 1 Application topically 2 (two) times daily.   Olopatadine  HCl 0.2 % SOLN Place 1 drop into both eyes daily as needed (allergies).   omeprazole  (PRILOSEC)  20 MG capsule Take 1 capsule (20 mg total) by mouth daily.   oxybutynin  (DITROPAN  XL) 5 MG 24 hr tablet Take 1 tablet (5 mg total) by mouth at bedtime.   oxyCODONE  (OXY IR/ROXICODONE ) 5 MG immediate release tablet Take 1 tablet (5 mg total) by mouth every 6 (six) hours as needed for severe pain (pain score 7-10).   OXYGEN  Inhale 2 L/min into the lungs as needed.   potassium chloride  SA (KLOR-CON  M) 20 MEQ tablet Take 2 tablets by mouth every morning and  take 1 tablet by mouth every evening.   Respiratory Therapy Supplies (FLUTTER) DEVI Use as directed   Semaglutide , 1 MG/DOSE, (OZEMPIC , 1 MG/DOSE,) 4 MG/3ML SOPN Inject 1 mg as directed once a week.   sodium chloride  (OCEAN) 0.65 % nasal spray Place 2 sprays into both nostrils every 4 (four) hours.   Tiotropium Bromide  Monohydrate (SPIRIVA  RESPIMAT) 2.5 MCG/ACT AERS Inhale 2 puffs into the lungs daily.   triamcinolone  cream (KENALOG ) 0.1 % Apply 1 Application topically 2 (two) times daily.   No facility-administered encounter medications on file as of 06/25/2024.    Allergies (  verified) Sulfa antibiotics, Chantix  [varenicline  tartrate], Fish allergy , Hygroton  [chlorthalidone ], Penicillins, and Latex   History: Past Medical History:  Diagnosis Date   Allergy     COPD (chronic obstructive pulmonary disease) (HCC)    Depression    Diabetes mellitus without complication (HCC)    Dyspnea    GERD (gastroesophageal reflux disease)    Hernia, abdominal    History of chicken pox    Hyperlipidemia    Hypertension    Internal hemorrhoids with Grade 3 prolapse and bleeding 06/19/2013   Osteoarthritis    Personal history of colonic adenomas 06/25/2013   Past Surgical History:  Procedure Laterality Date   ABDOMINAL HYSTERECTOMY  51yrs ago   due to heavy bleeding and fibroids    BREAST CYST EXCISION Right 01/03/2024   Procedure: EXCISION OF RIGHT NIPPLE CYST;  Surgeon: Belinda Cough, MD;  Location: [facility] OR;  Service: General;  Laterality: Right;    COLONOSCOPY     COLONOSCOPY WITH PROPOFOL  N/A 11/23/2023   Procedure: COLONOSCOPY WITH PROPOFOL ;  Surgeon: Avram Lupita BRAVO, MD;  Location: [facility];  Service: Gastroenterology;  Laterality: N/A;   EPIGASTRIC HERNIA REPAIR N/A 01/03/2024   Procedure: ABDOMINAL WOUND EXPLORATION;  Surgeon: Belinda Cough, MD;  Location: [facility] OR;  Service: General;  Laterality: N/A;  90 MINUTES LMA   HEMORRHOID BANDING  2014   HEMORRHOID SURGERY  22yrs agao   POLYPECTOMY  11/23/2023   Procedure: POLYPECTOMY;  Surgeon: Avram Lupita BRAVO, MD;  Location: [facility];  Service: Gastroenterology;;   RIGHT HEART CATH N/A 10/12/2022   Procedure: RIGHT HEART CATH;  Surgeon: Wendel Lurena POUR, MD;  Location: [facility] INVASIVE CV LAB;  Service: Cardiovascular;  Laterality: N/A;   UMBILICAL HERNIA REPAIR  06/2015   Family History  Problem Relation Age of Onset   Cancer Father        prostate   Hypertension Mother    Diabetes Mother    Hypertension Sister    Heart disease Maternal Uncle    Stroke Maternal Grandmother    Hypertension Maternal Grandmother    Hypertension Sister    Colon cancer Neg Hx    Stomach cancer Neg Hx    Rectal cancer Neg Hx    Colon polyps Neg Hx    Social History   Socioeconomic History   Marital status: Legally Separated    Spouse name: Not on file   Number of children: Not on file   Years of education: 12+   Highest education level: Not on file  Occupational History   Occupation: PCA    Employer: [employer]  Tobacco Use   Smoking status: Former    Current packs/day: 0.00    Average packs/day: 0.5 packs/day for 40.0 years (20.0 ttl pk-yrs)    Types: Cigarettes    Start date: 07/1981    Quit date: 07/2021    Years since quitting: 2.9    Passive exposure: Never   Smokeless tobacco: Never   Tobacco comments:    Quit [id number]  Vaping Use   Vaping status: Former  Substance and Sexual Activity   Alcohol use: Yes    Comment: occasional   Drug use: No   Sexual activity: Not  Currently  Other Topics Concern   Not on file  Social History Narrative   Regular exercise-no Caffeine Use-   Social Drivers of Health   Financial Resource Strain: Low Risk  (06/25/2024)   Overall Financial Resource Strain (CARDIA)    Difficulty of Paying Living Expenses: Not hard at  all  Food Insecurity: No Food Insecurity (06/25/2024)   Hunger Vital Sign    Worried About Running Out of Food in the Last Year: Never true    Ran Out of Food in the Last Year: Never true  Transportation Needs: No Transportation Needs (06/25/2024)   PRAPARE - Administrator, Civil Service (Medical): No    Lack of Transportation (Non-Medical): No  Physical Activity: Inactive (06/25/2024)   Exercise Vital Sign    Days of Exercise per Week: 0 days    Minutes of Exercise per Session: 0 min  Stress: No Stress Concern Present (06/25/2024)   [employer] of Occupational Health - Occupational Stress Questionnaire    Feeling of Stress: Not at all  Social Connections: Moderately Isolated (06/25/2024)   Social Connection and Isolation Panel    Frequency of Communication with Friends and Family: More than three times a week    Frequency of Social Gatherings with Friends and Family: More than three times a week    Attends Religious Services: Never    Database administrator or Organizations: Yes    Attends Banker Meetings: Never    Marital Status: Separated    Tobacco Counseling Counseling given: Not Answered Tobacco comments: Quit [id number]    Clinical Intake:  Pre-visit preparation completed: Yes  Pain : No/denies pain Pain Score: 0-No pain     BMI - recorded: 37.12 Nutritional Status: BMI > 30  Obese Nutritional Risks: None Diabetes: Yes CBG done?: No Did pt. bring in CBG monitor from home?: No  Lab Results  Component Value Date   HGBA1C 5.6 05/21/2024   HGBA1C 5.7 (H) 12/28/2023   HGBA1C 5.9 12/08/2023     How often do you need to have someone help you when you  read instructions, pamphlets, or other written materials from your doctor or pharmacy?: 1 - Never  Interpreter Needed?: No  Information entered by :: Edith Lord N. Sumaiya Arruda, LPN.   Activities of Daily Living     06/25/2024   11:58 AM 12/28/2023    9:10 AM  In your present state of health, do you have any difficulty performing the following activities:  Hearing? 0   Vision? 0   Difficulty concentrating or making decisions? 0   Comment BSE: READING & PUZZLES   Walking or climbing stairs? 0   Dressing or bathing? 0   Doing errands, shopping? 0 0  Preparing Food and eating ? N   Using the Toilet? N   In the past six months, have you accidently leaked urine? N   Do you have problems with loss of bowel control? N   Managing your Medications? N   Managing your Finances? N   Housekeeping or managing your Housekeeping? N     Patient Care Team: Vicci Barnie NOVAK, MD as PCP - General (Internal Medicine) Wendel Lurena POUR, MD as PCP - Cardiology (Cardiology) Paul Barrio, OD as Referring Physician (Optometry) Kassie Acquanetta Bradley, MD as Consulting Physician (Pulmonary Disease) Tommas Pears, MD as Referring Physician (Endocrinology)  I have updated your Care Teams any recent Medical Services you may have received from other providers in the past year.     Assessment:   This is a routine wellness examination for Krystal Allen.  Hearing/Vision screen Hearing Screening - Comments:: Adequate hearing, no hearing aids needed. Vision Screening - Comments:: Adequate vision with the use of eyeglasses. Eye exam done by [facility]    Goals Addressed  This Visit's Progress    Patient Stated       06/25/2024: No goals at this time.       Depression Screen     06/25/2024   11:59 AM 05/21/2024   10:09 AM 12/08/2023   10:18 AM 08/04/2023    9:13 AM 06/20/2023   12:53 PM 04/04/2023    9:32 AM 03/06/2023   10:28 AM  PHQ 2/9 Scores  PHQ - 2 Score 0 0 0  0 0 0  PHQ- 9  Score 0 0 0  0 0 0  Exception Documentation    Patient refusal       Fall Risk     06/25/2024   11:56 AM 05/21/2024   10:10 AM 06/20/2023   11:38 AM 04/04/2023    9:29 AM 03/06/2023   10:28 AM  Fall Risk   Falls in the past year? 0 0 0 0 0  Number falls in past yr: 0 0 0 0 0  Injury with Fall? 0 0 0 0 0  Risk for fall due to : No Fall Risks No Fall Risks History of fall(s);Impaired balance/gait;Impaired mobility No Fall Risks No Fall Risks  Follow up Falls evaluation completed Falls evaluation completed Falls prevention discussed;Education provided;Falls evaluation completed      MEDICARE RISK AT HOME:  Medicare Risk at Home Any stairs in or around the home?: Yes (FRONT & BACK ENTRANCE) If so, are there any without handrails?: No Home free of loose throw rugs in walkways, pet beds, electrical cords, etc?: Yes Adequate lighting in your home to reduce risk of falls?: Yes Life alert?: No Use of a cane, walker or w/c?: No Grab bars in the bathroom?: No (PATIENT VERY CAREFUL IN BATHROOM) Shower chair or bench in shower?: No Elevated toilet seat or a handicapped toilet?: No  TIMED UP AND GO:  Was the test performed?  No  Cognitive Function: Declined/Normal: No cognitive concerns noted by patient or family. Patient alert, oriented, able to answer questions appropriately and recall recent events. No signs of memory loss or confusion.    06/25/2024   11:59 AM  MMSE - Mini Mental State Exam  Not completed: Unable to complete        06/25/2024   12:02 PM 06/20/2023   12:58 PM 08/19/2022   10:39 AM  6CIT Screen  What Year? 0 points 0 points 0 points  What month? 0 points 0 points 0 points  What time? 0 points 0 points 0 points  Count back from 20 0 points 0 points 0 points  Months in reverse 0 points 2 points 4 points  Repeat phrase 0 points 0 points 2 points  Total Score 0 points 2 points 6 points    Immunizations Immunization History  Administered Date(s) Administered   Fluad  Quad(high Dose 65+) 08/16/2022   Fluad Trivalent(High Dose 65+) 08/04/2023   Influenza Split 07/10/2017   Influenza,inj,Quad PF,6+ Mos 08/14/2014, 09/14/2020, 09/13/2021   PFIZER Comirnaty(Gray Top)Covid-19 Tri-Sucrose Vaccine 01/25/2021   PFIZER(Purple Top)SARS-COV-2 Vaccination 05/12/2020, 06/02/2020   PNEUMOCOCCAL CONJUGATE-20 08/24/2021   Respiratory Syncytial Virus Vaccine ,Recomb Aduvanted(Arexvy ) 08/16/2022   Tdap 08/14/2014    Screening Tests Health Maintenance  Topic Date Due   DEXA SCAN  Never done   COVID-19 Vaccine (4 - 2024-25 season) 07/02/2023   INFLUENZA VACCINE  05/31/2024   Zoster Vaccines- Shingrix (1 of 2) 08/21/2024 (Originally 01/11/1975)   DTaP/Tdap/Td (2 - Td or Tdap) 08/14/2024   HEMOGLOBIN A1C  11/21/2024   Diabetic  kidney evaluation - eGFR measurement  05/21/2025   Diabetic kidney evaluation - Urine ACR  05/21/2025   FOOT EXAM  05/21/2025   Lung Cancer Screening  05/27/2025   OPHTHALMOLOGY EXAM  06/04/2025   Medicare Annual Wellness (AWV)  06/25/2025   MAMMOGRAM  11/14/2025   Colonoscopy  11/22/2028   Pneumococcal Vaccine: 50+ Years  Completed   Hepatitis C Screening  Completed   HPV VACCINES  Aged Out   Meningococcal B Vaccine  Aged Out    Health Maintenance  Health Maintenance Due  Topic Date Due   DEXA SCAN  Never done   COVID-19 Vaccine (4 - 2024-25 season) 07/02/2023   INFLUENZA VACCINE  05/31/2024   Health Maintenance Items Addressed: Yes Patient aware of current care gaps.  Patient is due for Flu vaccine in the Fall Season.  Patient declined Covid vaccine.  Additional Screening:  Vision Screening: Recommended annual ophthalmology exams for early detection of glaucoma and other disorders of the eye. Would you like a referral to an eye doctor? No    Dental Screening: Recommended annual dental exams for proper oral hygiene  Community Resource Referral / Chronic Care Management: CRR required this visit?  No   CCM required this visit?   No   Plan:    I have personally reviewed and noted the following in the patient's chart:   Medical and social history Use of alcohol, tobacco or illicit drugs  Current medications and supplements including opioid prescriptions. Patient is currently taking opioid prescriptions. Information provided to patient regarding non-opioid alternatives. Patient advised to discuss non-opioid treatment plan with their provider. Functional ability and status Nutritional status Physical activity Advanced directives List of other physicians Hospitalizations, surgeries, and ER visits in previous 12 months Vitals Screenings to include cognitive, depression, and falls Referrals and appointments  In addition, I have reviewed and discussed with patient certain preventive protocols, quality metrics, and best practice recommendations. A written personalized care plan for preventive services as well as general preventive health recommendations were provided to patient.   Krystal LOISE Fuller, LPN   1/[id number]/[id number]   After Visit Summary: (MyChart) Due to this being a telephonic visit, the after visit summary with patients personalized plan was offered to patient via MyChart   Notes: Patient aware of current care gaps.  Patient is due for Flu vaccine in the Fall Season.  Patient declined Covid vaccine.

## 2024-07-06 ENCOUNTER — Other Ambulatory Visit: Payer: Self-pay | Admitting: Internal Medicine

## 2024-07-06 DIAGNOSIS — E1169 Type 2 diabetes mellitus with other specified complication: Secondary | ICD-10-CM

## 2024-07-07 MED ORDER — OZEMPIC (1 MG/DOSE) 4 MG/3ML ~~LOC~~ SOPN
1.0000 mg | PEN_INJECTOR | SUBCUTANEOUS | 2 refills | Status: DC
  Filled 2024-07-07: qty 3, 28d supply, fill #0
  Filled 2024-08-03: qty 3, 28d supply, fill #1

## 2024-07-08 ENCOUNTER — Other Ambulatory Visit: Payer: Self-pay

## 2024-07-08 ENCOUNTER — Other Ambulatory Visit (HOSPITAL_COMMUNITY): Payer: Self-pay

## 2024-07-08 MED ORDER — ATORVASTATIN CALCIUM 80 MG PO TABS
80.0000 mg | ORAL_TABLET | Freq: Every day | ORAL | 3 refills | Status: AC
  Filled 2024-07-08: qty 90, 90d supply, fill #0
  Filled 2024-10-04: qty 90, 90d supply, fill #1

## 2024-07-09 ENCOUNTER — Other Ambulatory Visit: Payer: Self-pay

## 2024-07-10 ENCOUNTER — Other Ambulatory Visit: Payer: Self-pay

## 2024-07-24 DIAGNOSIS — R0602 Shortness of breath: Secondary | ICD-10-CM | POA: Diagnosis not present

## 2024-08-04 ENCOUNTER — Telehealth (HOSPITAL_COMMUNITY): Payer: Self-pay

## 2024-08-04 ENCOUNTER — Other Ambulatory Visit (HOSPITAL_COMMUNITY): Payer: Self-pay

## 2024-08-05 ENCOUNTER — Telehealth: Payer: Self-pay

## 2024-08-05 ENCOUNTER — Other Ambulatory Visit: Payer: Self-pay

## 2024-08-05 NOTE — Telephone Encounter (Signed)
 PA request has been Received. New Encounter will be created for follow up.

## 2024-08-05 NOTE — Telephone Encounter (Signed)
 Pharmacy Patient Advocate Encounter   Received notification from CoverMyMeds that prior authorization for FREESTYLE LIBRE 2 SENSOR is required/requested.   Insurance verification completed.   The patient is insured through [facility] MEDICARE PART D.   Per test claim: PA required; PA submitted to above mentioned insurance via CoverMyMeds Key/confirmation #/EOC [id number] Status is pending

## 2024-08-06 ENCOUNTER — Telehealth: Payer: Self-pay | Admitting: Internal Medicine

## 2024-08-06 ENCOUNTER — Other Ambulatory Visit (HOSPITAL_COMMUNITY): Payer: Self-pay

## 2024-08-06 ENCOUNTER — Other Ambulatory Visit: Payer: Self-pay | Admitting: Pharmacist

## 2024-08-06 ENCOUNTER — Other Ambulatory Visit: Payer: Self-pay

## 2024-08-06 MED ORDER — ACCU-CHEK GUIDE W/DEVICE KIT
PACK | 0 refills | Status: AC
  Filled 2024-08-06 – 2024-08-08 (×2): qty 1, 30d supply, fill #0

## 2024-08-06 MED ORDER — FREESTYLE LIBRE 2 SENSOR MISC
6 refills | Status: AC
  Filled 2024-08-06: qty 2, fill #0
  Filled 2024-08-08 – 2024-08-13 (×3): qty 2, 28d supply, fill #0
  Filled 2024-09-30: qty 2, 28d supply, fill #1
  Filled 2024-12-04: qty 2, 28d supply, fill #2

## 2024-08-06 MED ORDER — ACCU-CHEK GUIDE TEST VI STRP
ORAL_STRIP | 6 refills | Status: AC
  Filled 2024-08-06 – 2024-08-08 (×2): qty 100, 33d supply, fill #0
  Filled 2024-09-30: qty 100, 33d supply, fill #1
  Filled 2024-12-04: qty 100, 33d supply, fill #2

## 2024-08-06 MED ORDER — ACCU-CHEK SOFTCLIX LANCETS MISC
6 refills | Status: AC
  Filled 2024-08-06 – 2024-08-08 (×2): qty 100, 33d supply, fill #0
  Filled 2024-09-30: qty 100, 33d supply, fill #1
  Filled 2024-12-04: qty 100, 33d supply, fill #2

## 2024-08-06 NOTE — Addendum Note (Signed)
 Addended by: DELORES RUBY F on: 08/06/2024 05:00 PM   Modules accepted: Orders

## 2024-08-06 NOTE — Telephone Encounter (Signed)
 Patient calling for update. Patient states she is close to running out of the [place] sensors. Requesting callback: [phone]

## 2024-08-06 NOTE — Telephone Encounter (Signed)
 Copied from CRM #[id number]. Topic: Clinical - Prescription Issue >> Aug 06, 2024 10:26 AM Kevelyn M wrote: Reason for CRM: Patient calling because she was denied for the FREESTYLE LIBRE 2 SENSOR. Patient is requesting a replacement or re-appeal. Please advise.  Call back #[phone]

## 2024-08-06 NOTE — Telephone Encounter (Signed)
 Pharmacy Patient Advocate Encounter  Received notification from [facility] MEDICARE PART D [employer] that Prior Authorization for FREESTYLE LIBRE 2 SENSOR has been DENIED.  Full denial letter will be uploaded to the media tab. See denial reason below.   PA #/Case ID/Reference #: [id number]  **OZEMPIC  IS NOT RECOGNIZED AS MEETING 'INSULIN -DEPENDENT' CRITERIA  Continuous glucose monitor system (receiver, transmitter, and sensor) is denied for not meeting the prior authorization requirement(s). Product authorization requires the following: (1) Submission of medical records (for example: chart notes, laboratory values) or claims history documenting one of the following: (A) You are being treated with insulin . (B) You have a history of problematic hypoglycemia with documentation of recurrent level 2 hypoglycemic events [glucose less than 54mg /dl (3.0 mmol/L)] that persist despite multiple attempts to adjust medication(s) and/or modify the diabetes treatment plan. (C) You have a history of problematic hypoglycemia with documentation of a history of a level 3 hypoglycemic event [glucose less than 54mg /dl (3.0 mmol/L)] characterized by altered mental and/or physical state requiring third-party assistance for treatment of hypoglycemia.

## 2024-08-06 NOTE — Telephone Encounter (Unsigned)
 Copied from CRM [id number]. Topic: General - Call Back - No Documentation >> Aug 06, 2024 12:51 PM Ameerah G wrote: Reason for CRM: pt is calling because her PA was denied and was wondering if there is anything else she can get instead. Please advise [phone] ([phone]) >> Aug 06, 2024  1:06 PM Dedra NOVAK wrote: Renny from [facility] called to follow up freestyle herlene being denied. He said that PA did not meet the requirements and to contact pt to discuss other options.

## 2024-08-07 ENCOUNTER — Telehealth: Payer: Self-pay | Admitting: Internal Medicine

## 2024-08-07 ENCOUNTER — Other Ambulatory Visit: Payer: Self-pay

## 2024-08-07 MED ORDER — OZEMPIC (0.25 OR 0.5 MG/DOSE) 2 MG/3ML ~~LOC~~ SOPN
0.5000 mg | PEN_INJECTOR | SUBCUTANEOUS | 1 refills | Status: DC
  Filled 2024-08-07 – 2024-08-08 (×2): qty 3, 28d supply, fill #0
  Filled 2024-08-31: qty 3, 28d supply, fill #1

## 2024-08-07 NOTE — Telephone Encounter (Signed)
 Thanks for this response.  Please see pt's Mychart message from last evening. Is there any way we can try again for PA based on unique circumstance? If not, I can reduce the dose of Ozempic .

## 2024-08-07 NOTE — Telephone Encounter (Signed)
 Please see patient's message. She is diabetic on Ozempic  1 mg. Do we need to change to the [place] 3 or does she not qualify at all based on her insurance for continued use of CGM? If she does not qualify, then I will have her use manual device.

## 2024-08-07 NOTE — Telephone Encounter (Signed)
 See my recent telephone note that I just sent. Please resubmit request for approval on the CGM.

## 2024-08-07 NOTE — Telephone Encounter (Signed)
 Phone call placed to pt today regarding her insurance denying payment for her to continue to use the CGM. Pt currently on Ozempiz 1 mg once a wk and Jardiance  10 mg daily. I made her aware of insurance criteria for coverage of CGM including pt being treated with insulin , pt having history of problematic hypoglycemia with documentation of her current level 2 hypoglycemic events (BS less than 54) that persists despite multiple attempts to adjust medication and/or modify the diabetes treatment plan.  I did not get to include the third criteria that can qualify her that being history of problematic hypoglycemia with documentation of a history of level 3 hypoglycemia as pt became upset stating that she has low blood sugars just about every day and also at nights. Reports at nights it has dropped below 54 on many occasions requiring her to eat candy, drink orange juice or eat glucose tablets. Low episode today was 67. Reports being told by her insurance that CGM was not approved due to my documentation. She thinks her sugar was low on last visit with me in July. It was 104.  -Of note, pt had called in March reporting low blood sugar episodes at which time she was advised to make sure she gets in 3 meals a day. We decreased the Ozempic  from 2 mg to 1 mg a week. See phone call documented  under date 01/18/24 in chart.   Recommendations: -advised that since she is still have frequent low blood sugars, we need to decrease the dose of Ozempic  further to 0.5 mg once a wk. -we will resubmit request to her insurance for approval for CGM  -in the mean time, use her manual glucometer especially if CGM reads low in the middle of the night without symptoms. Can sometimes give false low if pt lays on the side that the sensor is on. Pt expressed frustration about possibility of having to do manual checks. Message sent to pharmacy tech to resubmit PA request based on frequent hypoglycemic episodes.

## 2024-08-08 ENCOUNTER — Encounter (HOSPITAL_COMMUNITY): Payer: Self-pay

## 2024-08-08 ENCOUNTER — Telehealth: Payer: Self-pay

## 2024-08-08 ENCOUNTER — Telehealth (HOSPITAL_COMMUNITY): Payer: Self-pay

## 2024-08-08 ENCOUNTER — Other Ambulatory Visit: Payer: Self-pay

## 2024-08-08 ENCOUNTER — Other Ambulatory Visit (HOSPITAL_COMMUNITY): Payer: Self-pay

## 2024-08-08 NOTE — Telephone Encounter (Signed)
 PA request has been Received. New Encounter has been or will be created for follow up. For additional info see Pharmacy Prior Auth telephone encounter from 08/08/24.

## 2024-08-08 NOTE — Telephone Encounter (Signed)
 Appeal for [employer] 2 sensor rejection manually faxed to [facility] Grievance Dept today for decision reconsideration.

## 2024-08-08 NOTE — Telephone Encounter (Signed)
 error

## 2024-08-09 NOTE — Progress Notes (Signed)
 Cardiology Office Note:   Date:  08/16/2024  ID:  JEZLYN WESTERFIELD, DOB 09-21-1956, MRN [id number] PCP:  Vicci Barnie NOVAK, MD  [facility] Providers Cardiologist:  Wendel Haws, MD Referring MD: Vicci Barnie NOVAK, MD  Chief Complaint/Reason for Referral: Follow-up CAD ASSESSMENT:    1. Coronary artery disease involving native coronary artery of native heart without angina pectoris   2. Type 2 diabetes mellitus with complication, with long-term current use of insulin  (HCC)   3. Hyperlipidemia associated with type 2 diabetes mellitus (HCC)   4. Aortic atherosclerosis   5. Hypertension associated with diabetes (HCC)   6. Stage 3 chronic kidney disease, unspecified whether stage 3a or 3b CKD (HCC)   7. Chronic obstructive pulmonary disease, unspecified COPD type (HCC)   8. BMI 40.0-44.9, adult (HCC)   9. Medication management     PLAN:   In order of problems listed above: CAD: Continue aspirin  81 mg, atorvastatin  80 mg T2DM: Continue aspirin  81 mg, atorvastatin  80 mg, Jardiance  10 mg, losartan  25 mg Hyperlipidemia: Continue atorvastatin  80 mg, Zetia  10 mg, Repatha  140 mg every 2 weeks.  Check lipid panel and LFTs today Aortic atherosclerosis: Continue aspirin  81 mg, atorvastatin  80 mg Hypertension: BP somewhat low.  I think her blood pressure is improved after significant weight loss due to Ozempic .  Will stop hydrochlorothiazide .  Continue losartan  25 mg daily. CKD stage IIIb: Continue losartan  25 mg, Jardiance  10 mg COPD: Followed by other providers.  Right heart catheterization 2023 demonstrated PVR 3 Wood units Elevated BMI: Continue Ozempic  0.5 mg q. weekly            Dispo:  Return in about 1 year (around 08/16/2025).       I spent 33 minutes reviewing all clinical data during and prior to this visit including all relevant imaging studies, laboratories, clinical information from other health systems and prior notes from both Cardiology and other specialties,  interviewing the patient, conducting a complete physical examination, and coordinating care in order to formulate a comprehensive and personalized evaluation and treatment plan.   History of Present Illness:    FOCUSED PROBLEM LIST:   CAD Moderate, FFR negative mid LAD coronary CTA 2023 Hyperlipidemia LP(a) 344 Aortic atherosclerosis Chest CT 2023 Hypertension  T2DM On insulin  CKD stage IIIb COPD and interstitial lung disease On supplemental oxygen  RHC mean RA 5 mmHg; wedge 7 mmHg, PVR 3 Woods units 2023 BMI 42  May 2023 consultation:  The patient is a 68 y.o. female with the indicated medical history here for recommendations regarding shortness of breath.  She was seen in the emergency department recently for chest pain which was tender to palpation.  She tells me that she has had increasing shortness of breath.  Her albuterol  inhaler does not seem to help this.  She does note some wheezing at times.  She is not short of breath at rest.  She has noticed increasing peripheral edema as well as some orthopnea.  She denies any paroxysmal nocturnal dyspnea.  She denies any overt chest pain.  She has had no presyncope, syncope, or palpitations.  She has had no signs or symptoms of stroke.   She does have a long history of smoking and is on chronic supplemental oxygen .  Her last PFTs in our system 10/2017 and her FEV1 was 2.51 with a r moderately severe fusion defect.  Plan: Obtain coronary CT due to dyspnea; start empiric Lasix ; increase atorvastatin  to 40 mg.   December 2023: In the  interim the patient had a coronary CTA which showed a moderate.  Her atorvastatin  was increased to 80 mg for an LDL above goal.  She tells me that starting Lasix  has helped her peripheral edema.  She has not noticed that her breathing is much improved.  She denies however any orthopnea or paroxysmal nocturnal dyspnea.  She does get short of breath when he exerts herself more than moderately.  She denies any exertional  angina.  She has not required any emergency room visits or hospitalizations.  She is scheduled to see Dr. Mona for elevated LP(a) level soon.  Plan: Refer for right heart catheterization to assess for pulmonary hypertension.   October 2024:  In the interim the patient had right heart catheterization which showed low filling pressures. She called our office recently with complaints of increasing lower extremity edema. Lower extremity Dopplers were pursued which were negative for DVT. Her Lasix  was increased to 20 mg twice daily.  She was seen in our office in March.  Her leg swelling was improved and compression therapy and routine exercise was recommended.  Today she is doing well.  She has occasional lower extremity edema that responds to as needed Lasix .  She denies any other cardiovascular symptoms.  She has lost about 15 pounds on semaglutide .  She has not required any hospitalizations or emergency room visits.  She feels very well and is without significant complaints today.  Plan: Stop hydralazine  and start losartan , start Jardiance .  October 2025:  Patient consents to use of AI scribe. Patient returns for routine follow-up.  Her LDL LDL last year was 33.  She has lost approximately 40 pounds, which she attributes to the use of Ozempic . She feels good overall and is pleased with the weight loss.  Her blood pressure has been running low recently, which she noticed during her last visit.  No swelling in her legs, noting significant improvement since losing weight. Previously, she experienced swelling every other day, but this is no longer the case.  She does not currently require any refills on her medications and is not experiencing any breathing difficulties.       Current Medications: Current Meds  Medication Sig   Accu-Chek Softclix Lancets lancets Use to check blood sugar 3 times daily.   acetaminophen  (TYLENOL  8 HOUR) 650 MG CR tablet Take 1 tablet (650 mg total) by mouth 2 (two) times  daily as needed for pain.   albuterol  (PROAIR  HFA) 108 (90 Base) MCG/ACT inhaler Inhale 2 puffs into the lungs every 4 (four) hours as needed for wheezing or shortness of breath.   albuterol  (PROVENTIL ) (2.5 MG/3ML) 0.083% nebulizer solution TAKE 3 MLS BY NEBULIZATION EVERY 6 (SIX) HOURS AS NEEDED FOR WHEEZING OR SHORTNESS OF BREATH.   aspirin  EC 81 MG tablet Take 1 tablet (81 mg total) by mouth daily.   atorvastatin  (LIPITOR) 80 MG tablet Take 1 tablet (80 mg total) by mouth daily.   Blood Glucose Monitoring Suppl (ACCU-CHEK GUIDE) w/Device KIT Use to check blood sugar 3 times daily.   Continuous Glucose Receiver (FREESTYLE LIBRE 2 READER) DEVI Use to check blood sugar continuously throughout the day. Change sensors once every 14 days.   Continuous Glucose Sensor (FREESTYLE LIBRE 2 SENSOR) MISC Use to check blood sugar continuously throughout the day.   diphenhydrAMINE  (BENADRYL ) 25 mg capsule Take 25-50 mg by mouth every 6 (six) hours as needed for itching or allergies.   empagliflozin  (JARDIANCE ) 10 MG TABS tablet Take 1 tablet (10 mg  total) by mouth daily before breakfast.   EPINEPHrine  0.3 mg/0.3 mL IJ SOAJ injection Inject 0.3 mg into the muscle as needed for anaphylaxis.   Evolocumab  (REPATHA  SURECLICK) 140 MG/ML SOAJ Inject 140 mg into the skin every 14 (fourteen) days.   ezetimibe  (ZETIA ) 10 MG tablet Take 1 tablet (10 mg total) by mouth daily.   famotidine  (PEPCID ) 20 MG tablet Take 1 tablet (20 mg total) by mouth 2 (two) times daily as needed (hives).   fexofenadine  (ALLEGRA  ALLERGY ) 180 MG tablet Take 1 tablet (180 mg total) by mouth 2 (two) times daily as needed (hives or allergies).   fluticasone  (FLONASE ) 50 MCG/ACT nasal spray Place 2 sprays into both nostrils daily as needed for allergies.   fluticasone  furoate-vilanterol (BREO ELLIPTA ) 200-25 MCG/ACT AEPB Inhale 1 puff into the lungs once daily.   furosemide  (LASIX ) 20 MG tablet Take 1 tablet (20 mg total) by mouth daily as needed  (for weight gain of 2-3 lbs overnight or swelling in legs/ankles).   glucose blood (ACCU-CHEK GUIDE TEST) test strip Use to check blood sugar 3 times daily.   losartan  (COZAAR ) 25 MG tablet Take 1 tablet (25 mg total) by mouth daily.   Olopatadine  HCl 0.2 % SOLN Place 1 drop into both eyes daily as needed (allergies).   omeprazole  (PRILOSEC) 20 MG capsule Take 1 capsule (20 mg total) by mouth daily.   oxybutynin  (DITROPAN  XL) 5 MG 24 hr tablet Take 1 tablet (5 mg total) by mouth at bedtime.   potassium chloride  SA (KLOR-CON  M) 20 MEQ tablet Take 2 tablets by mouth every morning and  take 1 tablet by mouth every evening.   Respiratory Therapy Supplies (FLUTTER) DEVI Use as directed   Semaglutide ,0.25 or 0.5MG /DOS, (OZEMPIC , 0.25 OR 0.5 MG/DOSE,) 2 MG/3ML SOPN Inject 0.5 mg into the skin once a week.   sodium chloride  (OCEAN) 0.65 % nasal spray Place 2 sprays into both nostrils every 4 (four) hours.   Tiotropium Bromide  Monohydrate (SPIRIVA  RESPIMAT) 2.5 MCG/ACT AERS Inhale 2 puffs into the lungs daily.   [DISCONTINUED] hydrochlorothiazide  (HYDRODIURIL ) 25 MG tablet Take 1 tablet (25 mg total) by mouth daily.     Review of Systems:   Please see the history of present illness.    All other systems reviewed and are negative.     EKGs/Labs/Other Test Reviewed:   EKG: 2025 normal sinus rhythm  EKG Interpretation Date/Time:    Ventricular Rate:    PR Interval:    QRS Duration:    QT Interval:    QTC Calculation:   R Axis:      Text Interpretation:          CARDIAC STUDIES: Refer to CV Procedures and Imaging Tabs   Risk Assessment/Calculations:          Physical Exam:   VS:  BP (!) 108/46   Pulse 63   Ht 5' 7 (1.702 m)   Wt 231 lb 3.2 oz (104.9 kg)   SpO2 98%   BMI 36.21 kg/m        Wt Readings from Last 3 Encounters:  08/16/24 231 lb 3.2 oz (104.9 kg)  06/25/24 230 lb (104.3 kg)  05/21/24 230 lb (104.3 kg)      GENERAL:  No apparent distress, AOx3 HEENT:  No  carotid bruits, +2 carotid impulses, no scleral icterus CAR: RRR no murmurs, gallops, rubs, or thrills RES:  Clear to auscultation bilaterally ABD:  Soft, nontender, nondistended, positive bowel sounds x 4 VASC:  +  2 radial pulses, +2 carotid pulses NEURO:  CN 2-12 grossly intact; motor and sensory grossly intact PSYCH:  No active depression or anxiety EXT:  No edema, ecchymosis, or cyanosis  Signed, Lurena MARLA Red, MD  08/16/2024 10:35 AM    [facility] [address] [place], [place], [place]  [address] Phone: [phone]; Fax: [phone]   Note:  This document was prepared using Dragon voice recognition software and may include unintentional dictation errors.

## 2024-08-13 ENCOUNTER — Other Ambulatory Visit (HOSPITAL_COMMUNITY): Payer: Self-pay

## 2024-08-14 ENCOUNTER — Other Ambulatory Visit: Payer: Self-pay

## 2024-08-14 ENCOUNTER — Telehealth: Payer: Self-pay

## 2024-08-14 NOTE — Telephone Encounter (Signed)
 Pharmacy Patient Advocate Encounter  Received notification from [facility] that Prior Authorization for FREESTYLE LIBRE 2 SENSOR has been APPROVED from 08/11/2024 to 10/30/2024

## 2024-08-16 ENCOUNTER — Ambulatory Visit: Attending: Internal Medicine | Admitting: Internal Medicine

## 2024-08-16 ENCOUNTER — Encounter: Payer: Self-pay | Admitting: Internal Medicine

## 2024-08-16 VITALS — BP 108/46 | HR 63 | Ht 67.0 in | Wt 231.2 lb

## 2024-08-16 DIAGNOSIS — I152 Hypertension secondary to endocrine disorders: Secondary | ICD-10-CM | POA: Diagnosis not present

## 2024-08-16 DIAGNOSIS — E785 Hyperlipidemia, unspecified: Secondary | ICD-10-CM | POA: Diagnosis not present

## 2024-08-16 DIAGNOSIS — E1159 Type 2 diabetes mellitus with other circulatory complications: Secondary | ICD-10-CM

## 2024-08-16 DIAGNOSIS — E1169 Type 2 diabetes mellitus with other specified complication: Secondary | ICD-10-CM | POA: Diagnosis not present

## 2024-08-16 DIAGNOSIS — N183 Chronic kidney disease, stage 3 unspecified: Secondary | ICD-10-CM | POA: Diagnosis not present

## 2024-08-16 DIAGNOSIS — E118 Type 2 diabetes mellitus with unspecified complications: Secondary | ICD-10-CM | POA: Diagnosis not present

## 2024-08-16 DIAGNOSIS — Z794 Long term (current) use of insulin: Secondary | ICD-10-CM

## 2024-08-16 DIAGNOSIS — Z79899 Other long term (current) drug therapy: Secondary | ICD-10-CM | POA: Diagnosis not present

## 2024-08-16 DIAGNOSIS — I2510 Atherosclerotic heart disease of native coronary artery without angina pectoris: Secondary | ICD-10-CM | POA: Diagnosis not present

## 2024-08-16 DIAGNOSIS — Z6841 Body Mass Index (BMI) 40.0 and over, adult: Secondary | ICD-10-CM

## 2024-08-16 DIAGNOSIS — J449 Chronic obstructive pulmonary disease, unspecified: Secondary | ICD-10-CM | POA: Diagnosis not present

## 2024-08-16 DIAGNOSIS — I700 Atherosclerosis of aorta: Secondary | ICD-10-CM

## 2024-08-16 NOTE — Patient Instructions (Signed)
 Medication Instructions:  STOP Hydrochlorothiazide  *If you need a refill on your cardiac medications before your next appointment, please call your pharmacy*  Lab Work: LIPIDS, LFT If you have labs (blood work) drawn today and your tests are completely normal, you will receive your results only by: MyChart Message (if you have MyChart) OR A paper copy in the mail If you have any lab test that is abnormal or we need to change your treatment, we will call you to review the results.  Testing/Procedures: NONE  Follow-Up: At [facility], you and your health needs are our priority.  As part of our continuing mission to provide you with exceptional heart care, our providers are all part of one team.  This team includes your primary Cardiologist (physician) and Advanced Practice Providers or APPs (Physician Assistants and Nurse Practitioners) who all work together to provide you with the care you need, when you need it.  Your next appointment:   1 year(s)  Provider:   Glendia Ferrier, PA-C     We recommend signing up for the patient portal called MyChart.  Sign up information is provided on this After Visit Summary.  MyChart is used to connect with patients for Virtual Visits (Telemedicine).  Patients are able to view lab/test results, encounter notes, upcoming appointments, etc.  Non-urgent messages can be sent to your provider as well.   To learn more about what you can do with MyChart, go to [email or web address].   Other Instructions NONE

## 2024-08-17 ENCOUNTER — Ambulatory Visit: Payer: Self-pay | Admitting: Internal Medicine

## 2024-08-17 LAB — HEPATIC FUNCTION PANEL
ALT: 10 IU/L (ref 0–32)
AST: 13 IU/L (ref 0–40)
Albumin: 3.6 g/dL — ABNORMAL LOW (ref 3.9–4.9)
Alkaline Phosphatase: 146 IU/L — ABNORMAL HIGH (ref 49–135)
Bilirubin Total: 0.3 mg/dL (ref 0.0–1.2)
Bilirubin, Direct: 0.11 mg/dL (ref 0.00–0.40)
Total Protein: 6.7 g/dL (ref 6.0–8.5)

## 2024-08-17 LAB — LIPID PANEL
Chol/HDL Ratio: 2 ratio (ref 0.0–4.4)
Cholesterol, Total: 118 mg/dL (ref 100–199)
HDL: 58 mg/dL (ref >39)
LDL Chol Calc (NIH): 48 mg/dL (ref 0–99)
Triglycerides: 51 mg/dL (ref 0–149)
VLDL Cholesterol Cal: 12 mg/dL (ref 5–40)

## 2024-08-31 ENCOUNTER — Other Ambulatory Visit: Payer: Self-pay | Admitting: Internal Medicine

## 2024-09-02 ENCOUNTER — Other Ambulatory Visit: Payer: Self-pay

## 2024-09-03 ENCOUNTER — Other Ambulatory Visit (HOSPITAL_COMMUNITY): Payer: Self-pay

## 2024-09-03 MED ORDER — REPATHA SURECLICK 140 MG/ML ~~LOC~~ SOAJ
2.0000 mL | SUBCUTANEOUS | 3 refills | Status: AC
  Filled 2024-09-03 – 2024-09-09 (×2): qty 6, 42d supply, fill #0
  Filled 2024-09-16: qty 6, 84d supply, fill #0

## 2024-09-04 ENCOUNTER — Other Ambulatory Visit (HOSPITAL_COMMUNITY): Payer: Self-pay

## 2024-09-05 ENCOUNTER — Other Ambulatory Visit: Payer: Self-pay

## 2024-09-05 ENCOUNTER — Other Ambulatory Visit: Payer: Self-pay | Admitting: Pharmacist

## 2024-09-05 MED ORDER — LOSARTAN POTASSIUM 25 MG PO TABS
25.0000 mg | ORAL_TABLET | Freq: Every day | ORAL | 3 refills | Status: AC
  Filled 2024-09-05: qty 90, 90d supply, fill #0
  Filled 2024-10-04: qty 90, 90d supply, fill #1

## 2024-09-05 NOTE — Progress Notes (Signed)
 Pharmacy Quality Measure Review  This patient is appearing on a report for the adherence measure for hypertension (ACEi/ARB) medications this calendar year.   Medication: losartan  Last fill date: 06/13/24 for 90 day supply  Collaborated with [facility] pharmacy. They are filling this for mail today.   Herlene Fleeta Morris, PharmD, JAQUELINE, CPP Clinical Pharmacist [facility] [phone]

## 2024-09-09 ENCOUNTER — Other Ambulatory Visit (HOSPITAL_COMMUNITY): Payer: Self-pay

## 2024-09-10 ENCOUNTER — Other Ambulatory Visit: Payer: Self-pay

## 2024-09-10 ENCOUNTER — Other Ambulatory Visit: Payer: Self-pay | Admitting: Internal Medicine

## 2024-09-10 ENCOUNTER — Other Ambulatory Visit (HOSPITAL_COMMUNITY): Payer: Self-pay

## 2024-09-12 ENCOUNTER — Other Ambulatory Visit: Payer: Self-pay

## 2024-09-12 ENCOUNTER — Ambulatory Visit: Payer: Self-pay

## 2024-09-12 ENCOUNTER — Other Ambulatory Visit (HOSPITAL_COMMUNITY): Payer: Self-pay

## 2024-09-12 MED ORDER — FUROSEMIDE 20 MG PO TABS
20.0000 mg | ORAL_TABLET | Freq: Every day | ORAL | 3 refills | Status: AC | PRN
  Filled 2024-09-12: qty 90, 90d supply, fill #0
  Filled 2024-10-04: qty 90, 90d supply, fill #1

## 2024-09-12 MED ORDER — EZETIMIBE 10 MG PO TABS
10.0000 mg | ORAL_TABLET | Freq: Every day | ORAL | 3 refills | Status: AC
  Filled 2024-09-12: qty 90, 90d supply, fill #0

## 2024-09-12 NOTE — Telephone Encounter (Signed)
 FYI Only or Action Required?: Action required by provider: update on patient condition.  Patient was last seen in primary care on 05/21/2024 by Vicci Barnie NOVAK, MD.  Called Nurse Triage reporting Breast Problem.  Symptoms began several days ago.  Interventions attempted: Ice/heat application.  Symptoms are: gradually worsening.  Triage Disposition: See Physician Within 24 Hours  Patient/caregiver understands and will follow disposition?: No, wishes to speak with PCP  Message from Darshell M sent at 09/12/2024  8:28 AM EST  Reason for Triage: Patient had surgery on right breast 2 or 3 months ago. Patient now has a knot in her right nipple and it is extremely sore. Sore even for clothes to rub against it. CB# [phone]   Reason for Disposition  [1] Breast looks infected (spreading redness, feels hot or painful to touch) AND [2] no fever  Answer Assessment - Initial Assessment Questions Pt with surgery on Right Breast for lump/cyst back in March. Lump has returned with tenderness, redness, swelling. Denies fever, CP, SOB. States her clothes, seatbelt, and anything that touches it causing 10/10 pain. Concerned because where the cyst was draining prior to surgery was out of the nipple Part that she does not have anymore. Feels like it needs to be drained. Has to pick up grandkids from school so unable to make it to an appt today. ED/UC precautions advised and understood. Number provided for surgeons office. Would like PCP to advise on if can get another referral to Gen Surgery Dr Belinda or if she needs to go into the ED for drainage.   1. SYMPTOM: What's the main symptom you're concerned about?  (e.g., lump, nipple discharge, pain, rash)     Lump on right nipple 2. LOCATION: Where is the nipple soreness located?     right 3. ONSET: When did nipple tenderness start?     Started Monday when she woke  4. PRIOR HISTORY: Do you have any history of prior problems with your  breasts? (e.g., breast cancer, breast implant, fibrocystic breast disease)     Lump drained in March 5. CAUSE: What do you think is causing this symptom?     Recurrent cyst/lump 6. OTHER SYMPTOMS: Do you have any other symptoms? (e.g., breast pain, fever, nipple discharge, redness or rash)     denies  Protocols used: Breast Symptoms-A-AH

## 2024-09-12 NOTE — Telephone Encounter (Signed)
 Routing to cma

## 2024-09-12 NOTE — Telephone Encounter (Signed)
 Advise UC or mobile unit if no open slots today or tomorrow.

## 2024-09-13 ENCOUNTER — Other Ambulatory Visit (HOSPITAL_COMMUNITY): Payer: Self-pay

## 2024-09-13 ENCOUNTER — Other Ambulatory Visit: Payer: Self-pay

## 2024-09-13 MED ORDER — DOXYCYCLINE MONOHYDRATE 100 MG PO CAPS
100.0000 mg | ORAL_CAPSULE | Freq: Two times a day (BID) | ORAL | 0 refills | Status: AC
  Filled 2024-09-13 (×2): qty 14, 7d supply, fill #0

## 2024-09-16 ENCOUNTER — Other Ambulatory Visit (HOSPITAL_COMMUNITY): Payer: Self-pay

## 2024-09-16 ENCOUNTER — Other Ambulatory Visit: Payer: Self-pay

## 2024-09-16 ENCOUNTER — Other Ambulatory Visit: Payer: Self-pay | Admitting: Internal Medicine

## 2024-09-16 MED ORDER — OZEMPIC (0.25 OR 0.5 MG/DOSE) 2 MG/3ML ~~LOC~~ SOPN
0.5000 mg | PEN_INJECTOR | SUBCUTANEOUS | 1 refills | Status: DC
  Filled 2024-09-16 – 2024-09-23 (×2): qty 3, 28d supply, fill #0

## 2024-09-16 NOTE — Telephone Encounter (Signed)
 Noted! Thank you

## 2024-09-23 ENCOUNTER — Other Ambulatory Visit (HOSPITAL_COMMUNITY): Payer: Self-pay

## 2024-09-23 ENCOUNTER — Encounter: Payer: Self-pay | Admitting: Internal Medicine

## 2024-09-23 ENCOUNTER — Other Ambulatory Visit: Payer: Self-pay

## 2024-09-23 ENCOUNTER — Ambulatory Visit: Attending: Internal Medicine | Admitting: Internal Medicine

## 2024-09-23 DIAGNOSIS — E1159 Type 2 diabetes mellitus with other circulatory complications: Secondary | ICD-10-CM

## 2024-09-23 DIAGNOSIS — E785 Hyperlipidemia, unspecified: Secondary | ICD-10-CM

## 2024-09-23 DIAGNOSIS — I152 Hypertension secondary to endocrine disorders: Secondary | ICD-10-CM | POA: Diagnosis not present

## 2024-09-23 DIAGNOSIS — F5083 Pica in adults: Secondary | ICD-10-CM

## 2024-09-23 DIAGNOSIS — Z2821 Immunization not carried out because of patient refusal: Secondary | ICD-10-CM

## 2024-09-23 DIAGNOSIS — N1832 Chronic kidney disease, stage 3b: Secondary | ICD-10-CM

## 2024-09-23 DIAGNOSIS — Z23 Encounter for immunization: Secondary | ICD-10-CM

## 2024-09-23 DIAGNOSIS — E1169 Type 2 diabetes mellitus with other specified complication: Secondary | ICD-10-CM

## 2024-09-23 DIAGNOSIS — Z7985 Long-term (current) use of injectable non-insulin antidiabetic drugs: Secondary | ICD-10-CM

## 2024-09-23 LAB — POCT GLYCOSYLATED HEMOGLOBIN (HGB A1C): HbA1c, POC (prediabetic range): 5.9 % (ref 5.7–6.4)

## 2024-09-23 LAB — GLUCOSE, POCT (MANUAL RESULT ENTRY): POC Glucose: 123 mg/dL — AB (ref 70–99)

## 2024-09-23 MED ORDER — FLUZONE HIGH-DOSE 0.5 ML IM SUSY
0.5000 mL | PREFILLED_SYRINGE | Freq: Once | INTRAMUSCULAR | 0 refills | Status: AC
  Filled 2024-09-23: qty 0.5, 1d supply, fill #0

## 2024-09-23 MED ORDER — OZEMPIC (0.25 OR 0.5 MG/DOSE) 2 MG/3ML ~~LOC~~ SOPN
0.5000 mg | PEN_INJECTOR | SUBCUTANEOUS | 5 refills | Status: DC
  Filled 2024-09-23 – 2024-10-26 (×2): qty 3, 28d supply, fill #0

## 2024-09-23 MED ORDER — TETANUS-DIPHTH-ACELL PERTUSSIS 5-2-15.5 LF-MCG/0.5 IM SUSP
0.5000 mL | Freq: Once | INTRAMUSCULAR | 0 refills | Status: AC
  Filled 2024-09-23: qty 0.5, 1d supply, fill #0

## 2024-09-23 MED ORDER — TETANUS-DIPHTH-ACELL PERTUSSIS 5-2-15.5 LF-MCG/0.5 IM SUSP: 0.5000 mL | Freq: Once | INTRAMUSCULAR | 0 refills | Status: AC

## 2024-09-23 NOTE — Patient Instructions (Signed)
  VISIT SUMMARY: During today's visit, we discussed the management of your diabetes, recent breast infection, chronic kidney disease, cholesterol levels, and blood pressure. We also reviewed your current medications and addressed your concerns about their side effects. Additionally, we talked about your cravings for cornstarch and the importance of maintaining a balanced diet.  YOUR PLAN: -TYPE 2 DIABETES MELLITUS: Type 2 diabetes is a condition where your body does not use insulin  properly, leading to high blood sugar levels. Your A1c is at 5.9, and your blood sugar was 223 mg/dL. We will continue your Ozempic  at 0.5 mg. Please try to eat three meals a day with smaller portions and include fruit snacks. We rechecked your blood count to rule out anemia and advised you to find alternative snacks for your cornstarch cravings.  -CHRONIC KIDNEY DISEASE STAGE 3: Chronic kidney disease stage 3 means your kidneys are moderately damaged and not working as well as they should. Your kidney function is stable, and we will continue with your current management and monitoring.  -HYPERLIPIDEMIA: Hyperlipidemia is a condition where you have high levels of fats (cholesterol) in your blood. You are currently managing this with Repatha , atorvastatin , and Zetia . Please continue taking these medications as prescribed.  -HYPERTENSION: Hypertension is high blood pressure. Your blood pressure is well controlled at 109/68 mmHg. We checked your potassium and electrolytes, and you should continue taking your potassium supplements.  -GENERAL HEALTH MAINTENANCE: You are due for flu and tetanus vaccinations. We provided a prescription for the tetanus vaccine. We discussed the shingles vaccine, but you have declined it.  INSTRUCTIONS: Please follow up with your doctor on December 1st to check on the status of your breast infection. Continue with your current medications and management plans. Make sure to get your flu and tetanus  vaccinations as discussed.                      Contains text generated by [employer]bridge.                                 Contains text generated by Abridge.

## 2024-09-23 NOTE — Progress Notes (Signed)
 Patient ID: Krystal Allen, female    DOB: 04-03-1956  MRN: [id number]  CC: Diabetes (DM & HTN f/u. Med refill. /No questions / concerns/Yes to flu & Tdap vax)   Subjective: Krystal Allen is a 68 y.o. female who presents for chronic ds management. Her concerns today include:  Pt with hx of HTN, HL, aortic atherosclerosis, COPD, ILD (pulmonary Langerhans' cell cell histiocytosis), nocturnal hypoxia followed by pulmonary, GERD, CKD 2-3, DM/obesity, former tob dep, DJD c-spine, spinal stenosis, stress incont, adenomatous colon polyp.     Discussed the use of AI scribe software for clinical note transcription with the patient, who gave verbal consent to proceed.  History of Present Illness   Krystal Allen is a 68 year old female with diabetes and chronic kidney disease who presents for follow-up of her diabetes management and recent breast infection.  She recently experienced an infection around the nipple of her right breast, for which she saw the surgeon x 2 and was treated with drainage and antibiotics. She has completed the abxs and has a follow up with the surgeon Dr. Belinda on December 1st.  DM/obesity:  Results for orders placed or performed in visit on 09/23/24  POCT glucose (manual entry)   Collection Time: 09/23/24 10:39 AM  Result Value Ref Range   POC Glucose 123 (A) 70 - 99 mg/dl  POCT glycosylated hemoglobin (Hb A1C)   Collection Time: 09/23/24 10:46 AM  Result Value Ref Range   Hemoglobin A1C     HbA1c POC (<> result, manual entry)     HbA1c, POC (prediabetic range) 5.9 5.7 - 6.4 %   HbA1c, POC (controlled diabetic range)    She has a history of diabetes and is currently on Ozempic  injections. Her blood sugar levels have been fluctuating, with fewer episodes of hypoglycemia since the dose was decreased to 0.5 mg about 6 wks ago. Her appetite is suppressed, leading to less frequent eating, which she believes may be affecting her blood sugar control. She uses orange juice  and glucose tablets to manage low blood sugar episodes. Her weight has increased from 230 lbs in July to 236 lbs currently, which she attributes to the reduced effectiveness of her current Ozempic  dose. She engages in some physical activity, including walking and chair exercises.  She has a craving for cornstarch, consuming about a box per week, and has a family history of anemia, though she has never been diagnosed with it herself. No craving for ice is reported. In childhood she use to eat clay. CBC on last visit was normal.  HL/atherosclerosis/HTN: on Repatha  injections Q 2 wks, atorvastatin  80 mg, and Zetia . She continues to take hydrochlorothiazide  despite being advised to stop by cardiologist Dr. Wendel on last visit 08/16/2024. Reports she plans to use what she still has. Concern she may develop swelling in legs ago if she stops taking. she takes potassium tablets. K+ level has been low in past.  HM: She has declined the shingles vaccine. Yes to tdapt and flu vaccine. She will get both at our pharmacy down stairs today. We are out of the high dose flu shot       Patient Active Problem List   Diagnosis Date Noted   Chronic hypoxic respiratory failure, on home oxygen  therapy (HCC) 12/08/2023   Herpes zoster vaccination declined 12/08/2023   Anxiety 04/03/2023   History of panic attacks 04/03/2023   Ventral hernia 04/03/2023   Chemosis of conjunctiva, bilateral 02/27/2023   Type 2  diabetes mellitus with chronic kidney disease, without long-term current use of insulin  (HCC) 02/27/2023   CKD stage 3b, GFR 30-44 ml/min (HCC) - Baseline Scr 1.5 02/27/2023   Morbid obesity (HCC) 03/09/2022   Seasonal allergies 02/07/2022   Mastitis, right, acute 12/23/2021   Influenza vaccine needed 09/14/2020   Tobacco dependence 05/12/2020   Aortic atherosclerosis 05/12/2020   Adenomatous polyp of colon 05/12/2020   Langerhan's cell histiocytosis (HCC) 01/26/2018   Cigarette smoker 01/26/2018    Osteoarthritis of cervical spine 12/22/2017   Prediabetes 12/22/2017   Pain of left calf 04/06/2017   Centrilobular emphysema (HCC) 02/23/2016   Obesity (BMI 30-39.9) 01/15/2016   Bilateral shoulder pain 01/15/2016   Esophageal reflux 01/15/2016   Tobacco abuse 05/13/2015   Lung nodule 08/14/2014   Impaired fasting glucose 08/14/2014   Hyperlipidemia 08/14/2014   Hx of adenomatous colonic polyps 06/25/2013   Hypertension associated with type 2 diabetes mellitus (HCC) 05/20/2013     Current Outpatient Medications on File Prior to Visit  Medication Sig Dispense Refill   Accu-Chek Softclix Lancets lancets Use to check blood sugar 3 times daily. 100 each 6   acetaminophen  (TYLENOL  8 HOUR) 650 MG CR tablet Take 1 tablet (650 mg total) by mouth 2 (two) times daily as needed for pain. 60 tablet 5   albuterol  (PROAIR  HFA) 108 (90 Base) MCG/ACT inhaler Inhale 2 puffs into the lungs every 4 (four) hours as needed for wheezing or shortness of breath. 6.7 g 0   albuterol  (PROVENTIL ) (2.5 MG/3ML) 0.083% nebulizer solution TAKE 3 MLS BY NEBULIZATION EVERY 6 (SIX) HOURS AS NEEDED FOR WHEEZING OR SHORTNESS OF BREATH. 90 mL 1   aspirin  EC 81 MG tablet Take 1 tablet (81 mg total) by mouth daily. 100 tablet 1   atorvastatin  (LIPITOR) 80 MG tablet Take 1 tablet (80 mg total) by mouth daily. 90 tablet 3   Blood Glucose Monitoring Suppl (ACCU-CHEK GUIDE) w/Device KIT Use to check blood sugar 3 times daily. 1 kit 0   Continuous Glucose Receiver (FREESTYLE LIBRE 2 READER) DEVI Use to check blood sugar continuously throughout the day. Change sensors once every 14 days. 1 each 0   Continuous Glucose Sensor (FREESTYLE LIBRE 2 SENSOR) MISC Use to check blood sugar continuously throughout the day. 2 each 6   diphenhydrAMINE  (BENADRYL ) 25 mg capsule Take 25-50 mg by mouth every 6 (six) hours as needed for itching or allergies.     doxycycline  (MONODOX ) 100 MG capsule Take 1 capsule (100 mg total) by mouth 2 (two) times  daily for 7 days. 14 capsule 0   empagliflozin  (JARDIANCE ) 10 MG TABS tablet Take 1 tablet (10 mg total) by mouth daily before breakfast. 90 tablet 3   EPINEPHrine  0.3 mg/0.3 mL IJ SOAJ injection Inject 0.3 mg into the muscle as needed for anaphylaxis. 2 each 2   Evolocumab  (REPATHA  SURECLICK) 140 MG/ML SOAJ Inject 140 mg into the skin every 14 (fourteen) days. 6 mL 3   ezetimibe  (ZETIA ) 10 MG tablet Take 1 tablet (10 mg total) by mouth daily. 90 tablet 3   famotidine  (PEPCID ) 20 MG tablet Take 1 tablet (20 mg total) by mouth 2 (two) times daily as needed (hives). 60 tablet 11   fexofenadine  (ALLEGRA  ALLERGY ) 180 MG tablet Take 1 tablet (180 mg total) by mouth 2 (two) times daily as needed (hives or allergies). 60 tablet 11   fluticasone  (FLONASE ) 50 MCG/ACT nasal spray Place 2 sprays into both nostrils daily as needed for allergies. 16  g 11   fluticasone  furoate-vilanterol (BREO ELLIPTA ) 200-25 MCG/ACT AEPB Inhale 1 puff into the lungs once daily. 60 each 11   furosemide  (LASIX ) 20 MG tablet Take 1 tablet (20 mg total) by mouth daily as needed (for weight gain of 2-3 lbs overnight or swelling in legs/ankles). 90 tablet 3   glucose blood (ACCU-CHEK GUIDE TEST) test strip Use to check blood sugar 3 times daily. 100 each 6   losartan  (COZAAR ) 25 MG tablet Take 1 tablet (25 mg total) by mouth daily. 90 tablet 3   Olopatadine  HCl 0.2 % SOLN Place 1 drop into both eyes daily as needed (allergies). 2.5 mL 5   omeprazole  (PRILOSEC) 20 MG capsule Take 1 capsule (20 mg total) by mouth daily. 90 capsule 3   oxybutynin  (DITROPAN  XL) 5 MG 24 hr tablet Take 1 tablet (5 mg total) by mouth at bedtime. 30 tablet 0   potassium chloride  SA (KLOR-CON  M) 20 MEQ tablet Take 2 tablets by mouth every morning and  take 1 tablet by mouth every evening. 270 tablet 2   Respiratory Therapy Supplies (FLUTTER) DEVI Use as directed 1 each 0   sodium chloride  (OCEAN) 0.65 % nasal spray Place 2 sprays into both nostrils every 4  (four) hours. 44 mL 0   Tiotropium Bromide  (SPIRIVA  RESPIMAT) 2.5 MCG/ACT AERS Inhale 2 puffs into the lungs daily. 4 g 11   No current facility-administered medications on file prior to visit.    Allergies  Allergen Reactions   Sulfa Antibiotics Anaphylaxis and Hives   Chantix  [Varenicline  Tartrate]     Bad dreams   Fish Allergy  Other (See Comments)    Severe stomach cramps    Hygroton  [Chlorthalidone ]     Swelling or hives   Penicillins Hives    Pt took Amoxicillin  in 2019 without issue, reports PCN caused rash in the past   Latex Rash and Other (See Comments)    Dry skin    Social History   Socioeconomic History   Marital status: Legally Separated    Spouse name: Not on file   Number of children: Not on file   Years of education: 12+   Highest education level: Not on file  Occupational History   Occupation: PCA    Employer: [employer]  Tobacco Use   Smoking status: Former    Current packs/day: 0.00    Average packs/day: 0.5 packs/day for 40.0 years (20.0 ttl pk-yrs)    Types: Cigarettes    Start date: 07/1981    Quit date: 07/2021    Years since quitting: 3.1    Passive exposure: Never   Smokeless tobacco: Never   Tobacco comments:    Quit [id number]  Vaping Use   Vaping status: Former  Substance and Sexual Activity   Alcohol use: Yes    Comment: occasional   Drug use: No   Sexual activity: Not Currently  Other Topics Concern   Not on file  Social History Narrative   Regular exercise-no Caffeine Use-   Social Drivers of Health   Financial Resource Strain: Low Risk  (06/25/2024)   Overall Financial Resource Strain (CARDIA)    Difficulty of Paying Living Expenses: Not hard at all  Food Insecurity: No Food Insecurity (06/25/2024)   Hunger Vital Sign    Worried About Running Out of Food in the Last Year: Never true    Ran Out of Food in the Last Year: Never true  Transportation Needs: No Transportation Needs (06/25/2024)   PRAPARE -  Scientist, Research (physical Sciences) (Medical): No    Lack of Transportation (Non-Medical): No  Physical Activity: Inactive (06/25/2024)   Exercise Vital Sign    Days of Exercise per Week: 0 days    Minutes of Exercise per Session: 0 min  Stress: No Stress Concern Present (06/25/2024)   [employer] of Occupational Health - Occupational Stress Questionnaire    Feeling of Stress: Not at all  Social Connections: Moderately Isolated (06/25/2024)   Social Connection and Isolation Panel    Frequency of Communication with Friends and Family: More than three times a week    Frequency of Social Gatherings with Friends and Family: More than three times a week    Attends Religious Services: Never    Database Administrator or Organizations: Yes    Attends Banker Meetings: Never    Marital Status: Separated  Intimate Partner Violence: Not At Risk (06/25/2024)   Humiliation, Afraid, Rape, and Kick questionnaire    Fear of Current or Ex-Partner: No    Emotionally Abused: No    Physically Abused: No    Sexually Abused: No    Family History  Problem Relation Age of Onset   Cancer Father        prostate   Hypertension Mother    Diabetes Mother    Hypertension Sister    Heart disease Maternal Uncle    Stroke Maternal Grandmother    Hypertension Maternal Grandmother    Hypertension Sister    Colon cancer Neg Hx    Stomach cancer Neg Hx    Rectal cancer Neg Hx    Colon polyps Neg Hx     Past Surgical History:  Procedure Laterality Date   ABDOMINAL HYSTERECTOMY  34yrs ago   due to heavy bleeding and fibroids    BREAST CYST EXCISION Right 01/03/2024   Procedure: EXCISION OF RIGHT NIPPLE CYST;  Surgeon: Belinda Cough, MD;  Location: [facility] OR;  Service: General;  Laterality: Right;   COLONOSCOPY     COLONOSCOPY WITH PROPOFOL  N/A 11/23/2023   Procedure: COLONOSCOPY WITH PROPOFOL ;  Surgeon: Avram Lupita BRAVO, MD;  Location: [facility];  Service: Gastroenterology;  Laterality: N/A;   EPIGASTRIC HERNIA  REPAIR N/A 01/03/2024   Procedure: ABDOMINAL WOUND EXPLORATION;  Surgeon: Belinda Cough, MD;  Location: [facility];  Service: General;  Laterality: N/A;  90 MINUTES LMA   HEMORRHOID BANDING  2014   HEMORRHOID SURGERY  29yrs agao   POLYPECTOMY  11/23/2023   Procedure: POLYPECTOMY;  Surgeon: Avram Lupita BRAVO, MD;  Location: [facility];  Service: Gastroenterology;;   RIGHT HEART CATH N/A 10/12/2022   Procedure: RIGHT HEART CATH;  Surgeon: Wendel Lurena POUR, MD;  Location: [facility] INVASIVE CV LAB;  Service: Cardiovascular;  Laterality: N/A;   UMBILICAL HERNIA REPAIR  06/2015    ROS: Review of Systems Negative except as stated above  PHYSICAL EXAM: BP 109/68 (BP Location: Left Arm, Patient Position: Sitting, Cuff Size: Large)   Pulse 69   Ht 5' 7 (1.702 m)   Wt 236 lb (107 kg)   SpO2 98%   BMI 36.96 kg/m   Wt Readings from Last 3 Encounters:  09/23/24 236 lb (107 kg)  08/16/24 231 lb 3.2 oz (104.9 kg)  06/25/24 230 lb (104.3 kg)    Physical Exam  General appearance - alert, well appearing, and in no distress Mental status - normal mood, behavior, speech, dress, motor activity, and thought processes Neck - supple, no significant adenopathy Chest -  clear to auscultation, no wheezes, rales or rhonchi, symmetric air entry Heart - normal rate, regular rhythm, normal S1, S2, no murmurs, rubs, clicks or gallops Extremities - peripheral pulses normal, no pedal edema, no clubbing or cyanosis      Latest Ref Rng & Units 08/16/2024   10:43 AM 05/21/2024   10:55 AM 12/28/2023    9:21 AM  CMP  Glucose 70 - 99 mg/dL  87  85   BUN 8 - 27 mg/dL  23  19   Creatinine 9.42 - 1.00 mg/dL  8.40  8.53   Sodium 865 - 144 mmol/L  140  140   Potassium 3.5 - 5.2 mmol/L  3.3  3.0   Chloride 96 - 106 mmol/L  98  103   CO2 20 - 29 mmol/L  23  27   Calcium  8.7 - 10.3 mg/dL  9.4  8.7   Total Protein 6.0 - 8.5 g/dL 6.7     Total Bilirubin 0.0 - 1.2 mg/dL 0.3     Alkaline Phos 49 - 135 IU/L 146     AST 0 - 40  IU/L 13     ALT 0 - 32 IU/L 10      Lipid Panel     Component Value Date/Time   CHOL 118 08/16/2024 1043   TRIG 51 08/16/2024 1043   HDL 58 08/16/2024 1043   CHOLHDL 2.0 08/16/2024 1043   CHOLHDL 3.7 08/17/2021 0340   VLDL 12 08/17/2021 0340   LDLCALC 48 08/16/2024 1043   LDLDIRECT 190.1 04/16/2013 1531    CBC    Component Value Date/Time   WBC 5.2 05/21/2024 1055   WBC 5.6 12/28/2023 0921   RBC 5.05 05/21/2024 1055   RBC 4.76 12/28/2023 0921   HGB 13.2 05/21/2024 1055   HCT 42.0 05/21/2024 1055   PLT 268 05/21/2024 1055   MCV 83 05/21/2024 1055   MCH 26.1 (L) 05/21/2024 1055   MCH 27.9 12/28/2023 0921   MCHC 31.4 (L) 05/21/2024 1055   MCHC 32.1 12/28/2023 0921   RDW 16.7 (H) 05/21/2024 1055   LYMPHSABS 1.9 02/26/2023 1849   LYMPHSABS 2.2 02/10/2020 1000   MONOABS 0.3 02/26/2023 1849   EOSABS 0.0 02/26/2023 1849   EOSABS 0.2 02/10/2020 1000   BASOSABS 0.0 02/26/2023 1849   BASOSABS 0.1 02/10/2020 1000    ASSESSMENT AND PLAN: 1. Type 2 diabetes mellitus with morbid obesity (HCC) (Primary) At goal. She was having frequent hypoglycemic episodes when she was on the 1 mg of Ozempic .  We have decreased the dose to 0.5 mg and she has noticed a significant decrease in hypoglycemic episodes.  She has gained several pounds with the decreased dose but I think we should stay the course on the 0.5 mg.  Encouraged her to avoid skipping meals and can have a healthy snack between meals.  Advised to be mindful to prevent overeating during the holiday season. - POCT glucose (manual entry) - POCT glycosylated hemoglobin (Hb A1C) - Semaglutide ,0.25 or 0.5MG /DOS, (OZEMPIC , 0.25 OR 0.5 MG/DOSE,) 2 MG/3ML SOPN; Inject 0.5 mg into the skin once a week.  Dispense: 3 mL; Refill: 5 - Basic Metabolic Panel  2. Hypertension associated with type 2 diabetes mellitus (HCC) At goal.  Patient to continue Cozaar  25 mg daily.  HCTZ was discontinued by her cardiologist last month but patient continues  to take it.  She continues the potassium supplement as well.  I will keep the HCTZ on her medication list since she is  still taking it.  3. Hyperlipidemia due to type 2 diabetes mellitus (HCC) On Repatha , atorvastatin  and Zetia .  Recent LDL was 48.  4. Stage 3b chronic kidney disease (HCC) Followed by nephrology.  Last GFR in July was 35.  She has range from 41-35 over the past year.  We will continue to monitor.  5. Pica in adults Encourage patient to stop eating cornstarch.  We will check CBC again with iron panel to screen for any iron deficiency. - CBC - Iron, TIBC and Ferritin Panel  6. Herpes zoster vaccination declined Recommended.  Patient declined.  7. Need for diphtheria-tetanus-pertussis (Tdap) vaccine Prescription for Tdap given for patient to take to our pharmacy downstairs to receive.  Advised that she can also get her flu shot at the pharmacy today as well since we are out of the high-dose flu shot in clinic.  She is agreeable to doing this. - Tdap (ADACEL ) 03-01-14.5 LF-MCG/0.5 injection; Inject 0.5 mLs into the muscle once for 1 dose.  Dispense: 0.5 mL; Refill: 0     Patient was given the opportunity to ask questions.  Patient verbalized understanding of the plan and was able to repeat key elements of the plan.   This documentation was completed using Paediatric nurse.  Any transcriptional errors are unintentional.  Orders Placed This Encounter  Procedures   CBC   Basic Metabolic Panel   Iron, TIBC and Ferritin Panel   POCT glucose (manual entry)   POCT glycosylated hemoglobin (Hb A1C)     Requested Prescriptions   Signed Prescriptions Disp Refills   Semaglutide ,0.25 or 0.5MG /DOS, (OZEMPIC , 0.25 OR 0.5 MG/DOSE,) 2 MG/3ML SOPN 3 mL 5    Sig: Inject 0.5 mg into the skin once a week.   Tdap (ADACEL ) 03-01-14.5 LF-MCG/0.5 injection 0.5 mL 0    Sig: Inject 0.5 mLs into the muscle once for 1 dose.    Return in about 4 months (around  01/21/2025).  Barnie Louder, MD, FACP

## 2024-09-24 ENCOUNTER — Other Ambulatory Visit: Payer: Self-pay

## 2024-09-24 ENCOUNTER — Ambulatory Visit: Payer: Self-pay | Admitting: Internal Medicine

## 2024-09-24 ENCOUNTER — Other Ambulatory Visit: Payer: Self-pay | Admitting: Internal Medicine

## 2024-09-24 ENCOUNTER — Other Ambulatory Visit (HOSPITAL_COMMUNITY): Payer: Self-pay

## 2024-09-24 DIAGNOSIS — E611 Iron deficiency: Secondary | ICD-10-CM

## 2024-09-24 DIAGNOSIS — F5083 Pica in adults: Secondary | ICD-10-CM

## 2024-09-24 LAB — CBC
Hematocrit: 42.1 % (ref 34.0–46.6)
Hemoglobin: 12.8 g/dL (ref 11.1–15.9)
MCH: 26 pg — ABNORMAL LOW (ref 26.6–33.0)
MCHC: 30.4 g/dL — ABNORMAL LOW (ref 31.5–35.7)
MCV: 85 fL (ref 79–97)
Platelets: 324 x10E3/uL (ref 150–450)
RBC: 4.93 x10E6/uL (ref 3.77–5.28)
RDW: 16 % — ABNORMAL HIGH (ref 11.7–15.4)
WBC: 6.2 x10E3/uL (ref 3.4–10.8)

## 2024-09-24 LAB — IRON,TIBC AND FERRITIN PANEL
Ferritin: 18 ng/mL (ref 15–150)
Iron Saturation: 12 % — ABNORMAL LOW (ref 15–55)
Iron: 38 ug/dL (ref 27–139)
Total Iron Binding Capacity: 329 ug/dL (ref 250–450)
UIBC: 291 ug/dL (ref 118–369)

## 2024-09-24 LAB — BASIC METABOLIC PANEL WITH GFR
BUN/Creatinine Ratio: 16 (ref 12–28)
BUN: 24 mg/dL (ref 8–27)
CO2: 26 mmol/L (ref 20–29)
Calcium: 9.4 mg/dL (ref 8.7–10.3)
Chloride: 100 mmol/L (ref 96–106)
Creatinine, Ser: 1.51 mg/dL — ABNORMAL HIGH (ref 0.57–1.00)
Glucose: 70 mg/dL (ref 70–99)
Potassium: 4.2 mmol/L (ref 3.5–5.2)
Sodium: 140 mmol/L (ref 134–144)
eGFR: 37 mL/min/1.73 — ABNORMAL LOW (ref >59)

## 2024-09-24 MED ORDER — FERROUS SULFATE 325 (65 FE) MG PO TABS
325.0000 mg | ORAL_TABLET | Freq: Every day | ORAL | 0 refills | Status: DC
  Filled 2024-09-24: qty 60, 60d supply, fill #0

## 2024-09-24 MED ORDER — HYDROCHLOROTHIAZIDE 25 MG PO TABS: 25.0000 mg | ORAL_TABLET | Freq: Every day | ORAL | Status: DC

## 2024-09-24 NOTE — Telephone Encounter (Signed)
 Copied from CRM [id number]. Topic: Clinical - Lab/Test Results >> Sep 24, 2024 12:15 PM Dedra B wrote:  Reason for CRM: Pt returning call regarding lab results.

## 2024-10-01 ENCOUNTER — Other Ambulatory Visit: Payer: Self-pay

## 2024-10-04 ENCOUNTER — Other Ambulatory Visit (HOSPITAL_COMMUNITY): Payer: Self-pay

## 2024-10-04 ENCOUNTER — Other Ambulatory Visit: Payer: Self-pay | Admitting: Internal Medicine

## 2024-10-07 ENCOUNTER — Other Ambulatory Visit: Payer: Self-pay

## 2024-10-07 MED ORDER — EMPAGLIFLOZIN 10 MG PO TABS
10.0000 mg | ORAL_TABLET | Freq: Every day | ORAL | 3 refills | Status: AC
  Filled 2024-10-07: qty 90, 90d supply, fill #0

## 2024-10-15 ENCOUNTER — Other Ambulatory Visit (HOSPITAL_COMMUNITY): Payer: Self-pay

## 2024-10-15 ENCOUNTER — Other Ambulatory Visit: Payer: Self-pay

## 2024-10-15 ENCOUNTER — Encounter (HOSPITAL_BASED_OUTPATIENT_CLINIC_OR_DEPARTMENT_OTHER): Payer: Self-pay | Admitting: Pulmonary Disease

## 2024-10-15 ENCOUNTER — Ambulatory Visit (INDEPENDENT_AMBULATORY_CARE_PROVIDER_SITE_OTHER): Admitting: Pulmonary Disease

## 2024-10-15 VITALS — BP 107/74 | HR 74 | Temp 98.5°F | Ht 67.0 in | Wt 242.4 lb

## 2024-10-15 DIAGNOSIS — J432 Centrilobular emphysema: Secondary | ICD-10-CM

## 2024-10-15 DIAGNOSIS — J42 Unspecified chronic bronchitis: Secondary | ICD-10-CM

## 2024-10-15 MED ORDER — ALBUTEROL SULFATE HFA 108 (90 BASE) MCG/ACT IN AERS
2.0000 | INHALATION_SPRAY | RESPIRATORY_TRACT | 0 refills | Status: AC | PRN
  Filled 2024-10-15 (×2): qty 6.7, 17d supply, fill #0

## 2024-10-15 MED ORDER — SPIRIVA RESPIMAT 2.5 MCG/ACT IN AERS
2.0000 | INHALATION_SPRAY | Freq: Every day | RESPIRATORY_TRACT | 11 refills | Status: AC
  Filled 2024-10-15 – 2024-10-25 (×3): qty 4, 30d supply, fill #0
  Filled 2024-12-04: qty 4, 30d supply, fill #1

## 2024-10-15 MED ORDER — ALBUTEROL SULFATE (2.5 MG/3ML) 0.083% IN NEBU
INHALATION_SOLUTION | RESPIRATORY_TRACT | 1 refills | Status: AC
  Filled 2024-10-15: qty 90, 17d supply, fill #0
  Filled 2024-10-15: qty 90, fill #0
  Filled 2024-12-04: qty 90, 17d supply, fill #1

## 2024-10-15 MED ORDER — FLUTICASONE FUROATE-VILANTEROL 200-25 MCG/ACT IN AEPB
1.0000 | INHALATION_SPRAY | Freq: Every day | RESPIRATORY_TRACT | 11 refills | Status: AC
  Filled 2024-10-15: qty 60, 60d supply, fill #0
  Filled 2024-10-15: qty 60, 30d supply, fill #0
  Filled 2024-10-25: qty 60, 60d supply, fill #0

## 2024-10-15 NOTE — Patient Instructions (Addendum)
 Nocturnal Hypoxemia --DISCONTINUE oxygen  --Will reconsider testing in the future when able  Emphysema --CONTINUE Breo 200-25 mcg ONE puff ONCE a day.  --CONTINUE Spiriva  2.5 mcg TWO puffs ONCE a day.  --CONTINUE Albuterol  or nebulizer AS NEEDED --INCREASE activity including walking to 20 min daily --ORDER pulmonary function tests prior to next visit

## 2024-10-15 NOTE — Progress Notes (Signed)
 Subjective:   PATIENT ID: Krystal Allen GENDER: female DOB: 1956/01/10, MRN: [id number]   HPI  Chief Complaint  Patient presents with   Emphysema    Reason for Visit: Follow-up  Ms. Krystal Allen is a 68 year old female former smoker with emphysema, chronic hypoxemic respiratory failure, Pulmonary Langerhans cell histiocytosis, hypertension, hyperlipidemia who presents follow-up.  She was hospitalized in October 2022 for COPD exacerbation secondary to influenza. She quit smoking since then and has done well. She does notice she is snacking more in place of her nicotine  craving. She has shortness of breath with wheezing. Wheezing occurs at night sometimes. No chronic cough. Symptoms worsen with changes in weather and will use her rescue inhaler 1-2 a week. She is compliant with Breo 200 daily. She wears her oxygen  when she feels short of breath. She checks her O2 levels which usually go down to 89%. She reports good quality of sleep >8 hours. She does snore. Her roommate does not report witnessed apnea or gasping. No morning headaches or fatigue.   04/15/22 Since our last visit she has been compliant with Breo and Spiriva  and feels this is helping her. She has been wearing oxygen  at night only when she thinks she needs it. Occasional shortness of breath. Sometimes coughing but this has improved. Wheezing with exertion. Does not feel like her albuterol  inhaler is effective. Uses it every other with several puffs. No exacerbations in the last 8 months when she was hospitalized for the flu.  11/03/22 Since our last visit she is compliant with her Breo and Spiriva . She uses albuterol  once a week when being outdoors. Weather changes including the cold care trigger wheezing. No limitations in ADLs but not exercising. Denies exacerbations requiring prednisone  or antibiotics. She has not compliant with nightly oxygen . Has shortness of breath with moderate exertion. She is working with her PCP to lose  weight and currently on phentermine .   04/10/23 Since our last visit she reports breathing is overall doing well. No exacerbations since our last visit. Did recently go to ED in April for allergic reaction. Denies shortness of breath cough or wheezing. Sometimes uses rescue inhaler at least once a week. Usually with day time activity. No night symptoms. Compliant with nocturnal O2. Not really active but has started walking a few days a week. She is working on weight loss with ozempic  and has lost 8lbs.  01/17/24 Since our last visit she reports she is doing well from breathing standpoint. Denies shortness of breath, cough or wheezing. Compliant with Breo and Spiriva . No exacerbations since our last visit. Had hernia repair without issue.  10/15/24 Since our last visit, has not had any exacerbations. Denies shortness of breath, cough or wheezing. Continues to be compliant with Breo and Spiriva . Uses rescue for shortness of breath and wheezing 1-2 times a week especially during colder weather. Winter is her worst season and masking in public. Has not been using oxygen  due to billing issues and has been months.  Social History: Former smoker. 1/2 ppd x 40 years [employer] x 33 years  Past Medical History:  Diagnosis Date   Allergy     COPD (chronic obstructive pulmonary disease) (HCC)    Depression    Diabetes mellitus without complication (HCC)    Dyspnea    GERD (gastroesophageal reflux disease)    Hernia, abdominal    History of chicken pox    Hyperlipidemia    Hypertension    Internal hemorrhoids with Grade 3 prolapse and bleeding  06/19/2013   Osteoarthritis    Personal history of colonic adenomas 06/25/2013     Family History  Problem Relation Age of Onset   Cancer Father        prostate   Hypertension Mother    Diabetes Mother    Hypertension Sister    Heart disease Maternal Uncle    Stroke Maternal Grandmother    Hypertension Maternal Grandmother    Hypertension Sister     Colon cancer Neg Hx    Stomach cancer Neg Hx    Rectal cancer Neg Hx    Colon polyps Neg Hx      Social History   Occupational History   Occupation: PCA    Employer: [employer]  Tobacco Use   Smoking status: Former    Current packs/day: 0.00    Average packs/day: 0.5 packs/day for 40.0 years (20.0 ttl pk-yrs)    Types: Cigarettes    Start date: 07/1981    Quit date: 07/2021    Years since quitting: 3.2    Passive exposure: Never   Smokeless tobacco: Never   Tobacco comments:    Quit [id number]  Vaping Use   Vaping status: Former  Substance and Sexual Activity   Alcohol use: Yes    Comment: occasional   Drug use: No   Sexual activity: Not Currently    Allergies  Allergen Reactions   Sulfa Antibiotics Anaphylaxis and Hives   Chantix  [Varenicline  Tartrate]     Bad dreams   Fish Allergy  Other (See Comments)    Severe stomach cramps    Hygroton  [Chlorthalidone ]     Swelling or hives   Penicillins Hives    Pt took Amoxicillin  in 2019 without issue, reports PCN caused rash in the past   Latex Rash and Other (See Comments)    Dry skin     Outpatient Medications Prior to Visit  Medication Sig Dispense Refill   Accu-Chek Softclix Lancets lancets Use to check blood sugar 3 times daily. 100 each 6   acetaminophen  (TYLENOL  8 HOUR) 650 MG CR tablet Take 1 tablet (650 mg total) by mouth 2 (two) times daily as needed for pain. 60 tablet 5   aspirin  EC 81 MG tablet Take 1 tablet (81 mg total) by mouth daily. 100 tablet 1   atorvastatin  (LIPITOR) 80 MG tablet Take 1 tablet (80 mg total) by mouth daily. 90 tablet 3   Blood Glucose Monitoring Suppl (ACCU-CHEK GUIDE) w/Device KIT Use to check blood sugar 3 times daily. 1 kit 0   Continuous Glucose Receiver (FREESTYLE LIBRE 2 READER) DEVI Use to check blood sugar continuously throughout the day. Change sensors once every 14 days. 1 each 0   Continuous Glucose Sensor (FREESTYLE LIBRE 2 SENSOR) MISC Use to check blood sugar continuously  throughout the day. 2 each 6   diphenhydrAMINE  (BENADRYL ) 25 mg capsule Take 25-50 mg by mouth every 6 (six) hours as needed for itching or allergies.     empagliflozin  (JARDIANCE ) 10 MG TABS tablet Take 1 tablet (10 mg total) by mouth daily before breakfast. 90 tablet 3   EPINEPHrine  0.3 mg/0.3 mL IJ SOAJ injection Inject 0.3 mg into the muscle as needed for anaphylaxis. 2 each 2   Evolocumab  (REPATHA  SURECLICK) 140 MG/ML SOAJ Inject 140 mg into the skin every 14 (fourteen) days. 6 mL 3   ezetimibe  (ZETIA ) 10 MG tablet Take 1 tablet (10 mg total) by mouth daily. 90 tablet 3   famotidine  (PEPCID ) 20 MG tablet Take  1 tablet (20 mg total) by mouth 2 (two) times daily as needed (hives). 60 tablet 11   ferrous sulfate  325 (65 FE) MG tablet Take 1 tablet (325 mg total) by mouth daily with breakfast. 60 tablet 0   fexofenadine  (ALLEGRA  ALLERGY ) 180 MG tablet Take 1 tablet (180 mg total) by mouth 2 (two) times daily as needed (hives or allergies). 60 tablet 11   fluticasone  (FLONASE ) 50 MCG/ACT nasal spray Place 2 sprays into both nostrils daily as needed for allergies. 16 g 11   furosemide  (LASIX ) 20 MG tablet Take 1 tablet (20 mg total) by mouth daily as needed (for weight gain of 2-3 lbs overnight or swelling in legs/ankles). 90 tablet 3   glucose blood (ACCU-CHEK GUIDE TEST) test strip Use to check blood sugar 3 times daily. 100 each 6   losartan  (COZAAR ) 25 MG tablet Take 1 tablet (25 mg total) by mouth daily. 90 tablet 3   Olopatadine  HCl 0.2 % SOLN Place 1 drop into both eyes daily as needed (allergies). 2.5 mL 5   omeprazole  (PRILOSEC) 20 MG capsule Take 1 capsule (20 mg total) by mouth daily. 90 capsule 3   potassium chloride  SA (KLOR-CON  M) 20 MEQ tablet Take 2 tablets by mouth every morning and  take 1 tablet by mouth every evening. 270 tablet 2   Respiratory Therapy Supplies (FLUTTER) DEVI Use as directed 1 each 0   Semaglutide ,0.25 or 0.5MG /DOS, (OZEMPIC , 0.25 OR 0.5 MG/DOSE,) 2 MG/3ML SOPN  Inject 0.5 mg into the skin once a week. 3 mL 5   sodium chloride  (OCEAN) 0.65 % nasal spray Place 2 sprays into both nostrils every 4 (four) hours. 44 mL 0   albuterol  (PROAIR  HFA) 108 (90 Base) MCG/ACT inhaler Inhale 2 puffs into the lungs every 4 (four) hours as needed for wheezing or shortness of breath. 6.7 g 0   albuterol  (PROVENTIL ) (2.5 MG/3ML) 0.083% nebulizer solution TAKE 3 MLS BY NEBULIZATION EVERY 6 (SIX) HOURS AS NEEDED FOR WHEEZING OR SHORTNESS OF BREATH. 90 mL 1   fluticasone  furoate-vilanterol (BREO ELLIPTA ) 200-25 MCG/ACT AEPB Inhale 1 puff into the lungs once daily. 60 each 11   Tiotropium Bromide  (SPIRIVA  RESPIMAT) 2.5 MCG/ACT AERS Inhale 2 puffs into the lungs daily. 4 g 11   doxycycline  (MONODOX ) 100 MG capsule Take 1 capsule (100 mg total) by mouth 2 (two) times daily for 7 days. (Patient not taking: Reported on 10/15/2024) 14 capsule 0   hydrochlorothiazide  (HYDRODIURIL ) 25 MG tablet Take 1 tablet (25 mg total) by mouth daily. (Patient not taking: Reported on 10/15/2024)     oxybutynin  (DITROPAN  XL) 5 MG 24 hr tablet Take 1 tablet (5 mg total) by mouth at bedtime. (Patient not taking: Reported on 10/15/2024) 30 tablet 0   No facility-administered medications prior to visit.    Review of Systems  Constitutional:  Negative for chills, diaphoresis, fever, malaise/fatigue and weight loss.  HENT:  Negative for congestion.   Respiratory:  Positive for shortness of breath and wheezing. Negative for cough, hemoptysis and sputum production.   Cardiovascular:  Negative for chest pain, palpitations and leg swelling.     Objective:   Vitals:   10/15/24 0937  BP: 107/74  Pulse: 74  Temp: 98.5 F (36.9 C)  SpO2: 99%  Weight: 242 lb 6.4 oz (110 kg)  Height: 5' 7 (1.702 m)   SpO2: 99 %  Physical Exam: General: Well-appearing, no acute distress HENT: [place], AT Eyes: EOMI, no scleral icterus Respiratory: Clear to  auscultation bilaterally.  No crackles, wheezing or  rales Cardiovascular: RRR, -M/R/G, no JVD Extremities:-Edema,-tenderness Neuro: AAO x4, CNII-XII grossly intact Psych: Normal mood, normal affect  Data Reviewed:  Imaging: CT 12/15/17 - Mild emphysema. Numerous pulmonary nodules and thin walled cysts bilaterally. Larged lung nodule is 6 mm in the RUL CXR 08/19/21 - Normal. No infiltrate, effusion or edema. CT Coronary 03/31/22 - Visualized lung parenchyma with no pulmonary nodules, masses, infiltrate, effusion or pneumothorax. CXR 08/10/22 - Normal. No infiltrate effusion or edema CT Lung Screen 06/09/24 - Mild emphysema. Stable pulmonary nodules compared to 05/03/23 with no new nodules  PFT: 01/26/21 FVC 2.99 (97%) FEV1 2.49 (103%) Ratio 79  TLC 88% DLCO 53% Interpretation: Normal spirometry and lung volumes. No significant BD response present. Moderately reduced gas exchange.  04/15/22 FVC 2.71 (91%) FEV1 2.06 (89%) Ratio 79 TLC 90% DLCO 60% Interpretation: Normal spirometry and lung volumes. Improved gas exchange to mildly reduced DLCO  09/18/23 FVC 2.88 (79%) FEV1 2.25 (81%) Ratio 78  TLC 100% DLCO 77% Interpretation: No obstructive or restrictive defect. Improved DLCO to 77%  Labs: CBC    Component Value Date/Time   WBC 6.2 09/23/2024 1218   WBC 5.6 12/28/2023 0921   RBC 4.93 09/23/2024 1218   RBC 4.76 12/28/2023 0921   HGB 12.8 09/23/2024 1218   HCT 42.1 09/23/2024 1218   PLT 324 09/23/2024 1218   MCV 85 09/23/2024 1218   MCH 26.0 (L) 09/23/2024 1218   MCH 27.9 12/28/2023 0921   MCHC 30.4 (L) 09/23/2024 1218   MCHC 32.1 12/28/2023 0921   RDW 16.0 (H) 09/23/2024 1218   LYMPHSABS 1.9 02/26/2023 1849   LYMPHSABS 2.2 02/10/2020 1000   MONOABS 0.3 02/26/2023 1849   EOSABS 0.0 02/26/2023 1849   EOSABS 0.2 02/10/2020 1000   BASOSABS 0.0 02/26/2023 1849   BASOSABS 0.1 02/10/2020 1000   Ambulatory O2   11/06/21 - Interpretation: Recommend 3L pulsed with exertion and sleep ONO 12/2021 demonstrated ongoing hypoxemia  04/15/22  - Interpretation: No desaturations on ambulation      Assessment & Plan:   Discussion: 68 year old female former smoker with emphysema, chronic hypoxemic respiratory failure, Pulmonary langerhans cell histiocytosis, HTN, HLD who presents for follow-up. No exacerbations in the last year. Well controlled on triple therapy however minimal activity. Discussed clinical course and management of COPD including bronchodilator regimen, preventive care including vaccinations (+flu, RSV, pneumococcal ) and action plan for exacerbation.  Emphysema --CONTINUE Breo 200-25 mcg ONE puff ONCE a day.  --CONTINUE Spiriva  2.5 mcg TWO puffs ONCE a day.  --CONTINUE Albuterol  or nebulizer AS NEEDED --INCREASE activity including walking to 20 min daily --ORDER pulmonary function tests prior to next visit  Nocturnal Hypoxemia --Previously ordered ONO however unable to obtain due to billing issues. Will need future ONO at some point to determine nocturnal O2 needs. --DISCONTINUE oxygen   Pulmonary Langerhans cell histiocytosis Mildly reduced DLCO --Improved DLCO on 09/2023 PFTs --Next PFTs due in 18 months (May 2026) at next visit  Morbid obesity BMI 42 >improved to 38 --Currently on ozempic  with PCP --Discussed regular aerobic exercise five days. Start slow and work up to 20-30 minutes daily --Take small weights with you during your walks  Health Maintenance Immunization History  Administered Date(s) Administered   Fluad Quad(high Dose 65+) 08/16/2022   Fluad Trivalent(High Dose 65+) 08/04/2023   INFLUENZA, HIGH DOSE SEASONAL PF 09/23/2024   Influenza Split 07/10/2017   Influenza,inj,Quad PF,6+ Mos 08/14/2014, 09/14/2020, 09/13/2021   PFIZER Comirnaty(Gray Top)Covid-19  Tri-Sucrose Vaccine 01/25/2021   PFIZER(Purple Top)SARS-COV-2 Vaccination 05/12/2020, 06/02/2020   PNEUMOCOCCAL CONJUGATE-20 08/24/2021   Respiratory Syncytial Virus Vaccine ,Recomb Aduvanted(Arexvy ) 08/16/2022   Tdap 08/14/2014,  09/23/2024   CT Lung Screen- Enrolled  Orders Placed This Encounter  Procedures   Ambulatory Referral for DME    Referral Priority:   Routine    Referral Type:   Durable Medical Equipment Purchase    Number of Visits Requested:   1   Pulmonary function test    Standing Status:   Future    Expiration Date:   04/15/2025    Scheduling Instructions:     Schedule in 6 months prior to next visit    Where should this test be performed?:   Outpatient Pulmonary    What type of PFT is being ordered?:   Full PFT   Meds ordered this encounter  Medications   fluticasone  furoate-vilanterol (BREO ELLIPTA ) 200-25 MCG/ACT AEPB    Sig: Inhale 1 puff into the lungs once daily.    Dispense:  60 each    Refill:  11   Tiotropium Bromide  (SPIRIVA  RESPIMAT) 2.5 MCG/ACT AERS    Sig: Inhale 2 puffs into the lungs daily.    Dispense:  4 g    Refill:  11   albuterol  (PROVENTIL ) (2.5 MG/3ML) 0.083% nebulizer solution    Sig: TAKE 3 MLS BY NEBULIZATION EVERY 6 (SIX) HOURS AS NEEDED FOR WHEEZING OR SHORTNESS OF BREATH.    Dispense:  90 mL    Refill:  1   albuterol  (PROAIR  HFA) 108 (90 Base) MCG/ACT inhaler    Sig: Inhale 2 puffs into the lungs every 4 (four) hours as needed for wheezing or shortness of breath.    Dispense:  6.7 g    Refill:  0    Return in about 5 months (around 03/15/2025) for after PFT with me.  I have spent a total time of 32-minutes on the day of the appointment including chart review, data review, collecting history, coordinating care and discussing medical diagnosis and plan with the patient/family. Past medical history, allergies, medications were reviewed. Pertinent imaging, labs and tests included in this note have been reviewed and interpreted independently by me.  Kay Ricciuti Slater Staff, MD  Pulmonary Critical Care 10/15/2024 10:35 AM

## 2024-10-16 ENCOUNTER — Other Ambulatory Visit: Payer: Self-pay

## 2024-10-25 ENCOUNTER — Other Ambulatory Visit: Payer: Self-pay

## 2024-10-25 ENCOUNTER — Other Ambulatory Visit (HOSPITAL_COMMUNITY): Payer: Self-pay

## 2024-10-26 ENCOUNTER — Other Ambulatory Visit (HOSPITAL_COMMUNITY): Payer: Self-pay

## 2024-10-27 ENCOUNTER — Other Ambulatory Visit (HOSPITAL_COMMUNITY): Payer: Self-pay

## 2024-11-06 ENCOUNTER — Other Ambulatory Visit: Payer: Self-pay

## 2024-11-06 ENCOUNTER — Other Ambulatory Visit: Payer: Self-pay | Admitting: Internal Medicine

## 2024-11-06 DIAGNOSIS — I10 Essential (primary) hypertension: Secondary | ICD-10-CM

## 2024-11-06 MED ORDER — HYDROCHLOROTHIAZIDE 25 MG PO TABS
25.0000 mg | ORAL_TABLET | Freq: Every day | ORAL | 1 refills | Status: AC
  Filled 2024-11-06: qty 90, 90d supply, fill #0

## 2024-11-12 ENCOUNTER — Encounter: Payer: Self-pay | Admitting: Internal Medicine

## 2024-11-13 ENCOUNTER — Other Ambulatory Visit: Payer: Self-pay | Admitting: Internal Medicine

## 2024-11-13 ENCOUNTER — Other Ambulatory Visit (HOSPITAL_COMMUNITY): Payer: Self-pay

## 2024-11-13 ENCOUNTER — Other Ambulatory Visit: Payer: Self-pay

## 2024-11-13 MED ORDER — SEMAGLUTIDE (1 MG/DOSE) 4 MG/3ML ~~LOC~~ SOPN
1.0000 mg | PEN_INJECTOR | SUBCUTANEOUS | 1 refills | Status: AC
  Filled 2024-11-13: qty 9, 84d supply, fill #0

## 2024-12-01 ENCOUNTER — Encounter: Payer: Self-pay | Admitting: Internal Medicine

## 2024-12-01 NOTE — Progress Notes (Signed)
 Received form for handicap sticker. Pt has COPD and uses nocturnal oxygen . Form completed.

## 2024-12-02 ENCOUNTER — Ambulatory Visit

## 2024-12-04 ENCOUNTER — Telehealth: Payer: Self-pay

## 2024-12-04 ENCOUNTER — Other Ambulatory Visit: Payer: Self-pay

## 2024-12-04 ENCOUNTER — Other Ambulatory Visit: Payer: Self-pay | Admitting: Internal Medicine

## 2024-12-04 MED ORDER — FERROUS SULFATE 325 (65 FE) MG PO TABS
325.0000 mg | ORAL_TABLET | Freq: Every day | ORAL | 0 refills | Status: AC
  Filled 2024-12-04: qty 90, 90d supply, fill #0

## 2024-12-04 NOTE — Telephone Encounter (Signed)
 Noted! Thank you

## 2024-12-04 NOTE — Telephone Encounter (Signed)
 Called patient but no answer. LVM informing patient that the Handicap Placard form is ready for pick-up. Form left at the front office.

## 2024-12-04 NOTE — Telephone Encounter (Addendum)
 Patient returned call, message was relayed.   Called patient but no answer. LVM informing patient that the Handicap Placard form is ready for pick-up. Form left at the front office.

## 2024-12-05 ENCOUNTER — Other Ambulatory Visit (HOSPITAL_COMMUNITY): Payer: Self-pay

## 2024-12-05 ENCOUNTER — Other Ambulatory Visit (HOSPITAL_BASED_OUTPATIENT_CLINIC_OR_DEPARTMENT_OTHER): Payer: Self-pay

## 2024-12-09 ENCOUNTER — Ambulatory Visit

## 2025-01-15 ENCOUNTER — Ambulatory Visit (HOSPITAL_BASED_OUTPATIENT_CLINIC_OR_DEPARTMENT_OTHER)

## 2025-01-21 ENCOUNTER — Ambulatory Visit: Admitting: Internal Medicine

## 2025-03-25 ENCOUNTER — Encounter (HOSPITAL_BASED_OUTPATIENT_CLINIC_OR_DEPARTMENT_OTHER)

## 2025-03-25 ENCOUNTER — Ambulatory Visit (HOSPITAL_BASED_OUTPATIENT_CLINIC_OR_DEPARTMENT_OTHER): Admitting: Pulmonary Disease

## 2025-07-01 ENCOUNTER — Ambulatory Visit
